# Patient Record
Sex: Female | Born: 1965 | Race: Black or African American | Hispanic: No | Marital: Single | Smoking: Never smoker
Health system: Southern US, Community
[De-identification: ages and names within clinical notes are randomized; demographics above are authoritative.]

## PROBLEM LIST (undated history)

## (undated) DIAGNOSIS — Z8719 Personal history of other diseases of the digestive system: Secondary | ICD-10-CM

## (undated) DIAGNOSIS — I8390 Asymptomatic varicose veins of unspecified lower extremity: Secondary | ICD-10-CM

## (undated) DIAGNOSIS — J3801 Paralysis of vocal cords and larynx, unilateral: Secondary | ICD-10-CM

## (undated) DIAGNOSIS — N3010 Interstitial cystitis (chronic) without hematuria: Secondary | ICD-10-CM

## (undated) DIAGNOSIS — G4733 Obstructive sleep apnea (adult) (pediatric): Secondary | ICD-10-CM

## (undated) DIAGNOSIS — K529 Noninfective gastroenteritis and colitis, unspecified: Secondary | ICD-10-CM

## (undated) DIAGNOSIS — E1140 Type 2 diabetes mellitus with diabetic neuropathy, unspecified: Secondary | ICD-10-CM

## (undated) DIAGNOSIS — Z9989 Dependence on other enabling machines and devices: Secondary | ICD-10-CM

## (undated) DIAGNOSIS — D649 Anemia, unspecified: Secondary | ICD-10-CM

## (undated) DIAGNOSIS — IMO0002 Reserved for concepts with insufficient information to code with codable children: Secondary | ICD-10-CM

## (undated) DIAGNOSIS — K922 Gastrointestinal hemorrhage, unspecified: Secondary | ICD-10-CM

## (undated) DIAGNOSIS — C73 Malignant neoplasm of thyroid gland: Secondary | ICD-10-CM

## (undated) DIAGNOSIS — H8190 Unspecified disorder of vestibular function, unspecified ear: Secondary | ICD-10-CM

## (undated) DIAGNOSIS — R112 Nausea with vomiting, unspecified: Secondary | ICD-10-CM

## (undated) DIAGNOSIS — I890 Lymphedema, not elsewhere classified: Secondary | ICD-10-CM

## (undated) DIAGNOSIS — L732 Hidradenitis suppurativa: Secondary | ICD-10-CM

## (undated) DIAGNOSIS — T8859XA Other complications of anesthesia, initial encounter: Secondary | ICD-10-CM

## (undated) DIAGNOSIS — M797 Fibromyalgia: Secondary | ICD-10-CM

## (undated) DIAGNOSIS — E7800 Pure hypercholesterolemia, unspecified: Secondary | ICD-10-CM

## (undated) DIAGNOSIS — D573 Sickle-cell trait: Secondary | ICD-10-CM

## (undated) DIAGNOSIS — Q211 Atrial septal defect, unspecified: Secondary | ICD-10-CM

## (undated) DIAGNOSIS — Z860100 Personal history of colon polyps, unspecified: Secondary | ICD-10-CM

## (undated) DIAGNOSIS — H04123 Dry eye syndrome of bilateral lacrimal glands: Secondary | ICD-10-CM

## (undated) DIAGNOSIS — R42 Dizziness and giddiness: Secondary | ICD-10-CM

## (undated) DIAGNOSIS — R0600 Dyspnea, unspecified: Secondary | ICD-10-CM

## (undated) DIAGNOSIS — Z9889 Other specified postprocedural states: Secondary | ICD-10-CM

## (undated) DIAGNOSIS — Z8489 Family history of other specified conditions: Secondary | ICD-10-CM

## (undated) DIAGNOSIS — G8929 Other chronic pain: Secondary | ICD-10-CM

## (undated) DIAGNOSIS — E119 Type 2 diabetes mellitus without complications: Secondary | ICD-10-CM

## (undated) DIAGNOSIS — E890 Postprocedural hypothyroidism: Secondary | ICD-10-CM

## (undated) DIAGNOSIS — K449 Diaphragmatic hernia without obstruction or gangrene: Secondary | ICD-10-CM

## (undated) DIAGNOSIS — G43909 Migraine, unspecified, not intractable, without status migrainosus: Secondary | ICD-10-CM

## (undated) DIAGNOSIS — M549 Dorsalgia, unspecified: Secondary | ICD-10-CM

## (undated) DIAGNOSIS — H409 Unspecified glaucoma: Secondary | ICD-10-CM

## (undated) DIAGNOSIS — M1990 Unspecified osteoarthritis, unspecified site: Secondary | ICD-10-CM

## (undated) DIAGNOSIS — K219 Gastro-esophageal reflux disease without esophagitis: Secondary | ICD-10-CM

## (undated) DIAGNOSIS — D259 Leiomyoma of uterus, unspecified: Secondary | ICD-10-CM

## (undated) DIAGNOSIS — M3500 Sicca syndrome, unspecified: Secondary | ICD-10-CM

## (undated) DIAGNOSIS — E278 Other specified disorders of adrenal gland: Secondary | ICD-10-CM

## (undated) DIAGNOSIS — I509 Heart failure, unspecified: Secondary | ICD-10-CM

## (undated) DIAGNOSIS — N809 Endometriosis, unspecified: Secondary | ICD-10-CM

## (undated) DIAGNOSIS — M47812 Spondylosis without myelopathy or radiculopathy, cervical region: Secondary | ICD-10-CM

## (undated) DIAGNOSIS — R251 Tremor, unspecified: Principal | ICD-10-CM

## (undated) DIAGNOSIS — M179 Osteoarthritis of knee, unspecified: Secondary | ICD-10-CM

## (undated) DIAGNOSIS — T4145XA Adverse effect of unspecified anesthetic, initial encounter: Secondary | ICD-10-CM

## (undated) DIAGNOSIS — J42 Unspecified chronic bronchitis: Secondary | ICD-10-CM

## (undated) DIAGNOSIS — K259 Gastric ulcer, unspecified as acute or chronic, without hemorrhage or perforation: Secondary | ICD-10-CM

## (undated) DIAGNOSIS — F4310 Post-traumatic stress disorder, unspecified: Secondary | ICD-10-CM

## (undated) DIAGNOSIS — M1710 Unilateral primary osteoarthritis, unspecified knee: Secondary | ICD-10-CM

## (undated) DIAGNOSIS — I10 Essential (primary) hypertension: Secondary | ICD-10-CM

## (undated) DIAGNOSIS — K589 Irritable bowel syndrome without diarrhea: Secondary | ICD-10-CM

## (undated) DIAGNOSIS — K760 Fatty (change of) liver, not elsewhere classified: Secondary | ICD-10-CM

## (undated) DIAGNOSIS — F32A Depression, unspecified: Secondary | ICD-10-CM

## (undated) HISTORY — DX: Hidradenitis suppurativa: L73.2

## (undated) HISTORY — DX: Diaphragmatic hernia without obstruction or gangrene: K44.9

## (undated) HISTORY — PX: LAPAROSCOPY ABDOMEN DIAGNOSTIC: PRO50

## (undated) HISTORY — PX: OTHER SURGICAL HISTORY: SHX169

## (undated) HISTORY — PX: DIAGNOSTIC LAPAROSCOPY: SUR761

## (undated) HISTORY — DX: Essential (primary) hypertension: I10

## (undated) HISTORY — DX: Dyspnea, unspecified: R06.00

## (undated) HISTORY — DX: Reserved for concepts with insufficient information to code with codable children: IMO0002

## (undated) HISTORY — DX: Interstitial cystitis (chronic) without hematuria: N30.10

## (undated) HISTORY — DX: Obstructive sleep apnea (adult) (pediatric): G47.33

## (undated) HISTORY — DX: Gastro-esophageal reflux disease without esophagitis: K21.9

## (undated) HISTORY — DX: Type 2 diabetes mellitus with diabetic neuropathy, unspecified: E11.40

## (undated) HISTORY — PX: COLONOSCOPY WITH ESOPHAGOGASTRODUODENOSCOPY (EGD): SHX5779

## (undated) HISTORY — DX: Endometriosis, unspecified: N80.9

## (undated) HISTORY — PX: DE QUERVAIN'S RELEASE: SHX1439

## (undated) HISTORY — DX: Irritable bowel syndrome, unspecified: K58.9

## (undated) HISTORY — DX: Type 2 diabetes mellitus without complications: E11.9

## (undated) HISTORY — PX: DILATION AND CURETTAGE OF UTERUS: SHX78

## (undated) HISTORY — DX: Sjogren syndrome, unspecified: M35.00

## (undated) HISTORY — DX: Tremor, unspecified: R25.1

---
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
## 1988-05-18 HISTORY — PX: TONSILLECTOMY: SUR1361

## 1995-05-19 HISTORY — PX: REDUCTION MAMMAPLASTY: SUR839

## 1997-12-10 ENCOUNTER — Emergency Department (HOSPITAL_COMMUNITY): Admission: EM | Admit: 1997-12-10 | Discharge: 1997-12-10 | Payer: Self-pay | Admitting: Emergency Medicine

## 1998-05-18 HISTORY — PX: HYDRADENITIS EXCISION: SHX5243

## 1998-06-20 ENCOUNTER — Other Ambulatory Visit: Admission: RE | Admit: 1998-06-20 | Discharge: 1998-06-20 | Payer: Self-pay | Admitting: Obstetrics and Gynecology

## 1998-08-12 ENCOUNTER — Emergency Department (HOSPITAL_COMMUNITY): Admission: EM | Admit: 1998-08-12 | Discharge: 1998-08-12 | Payer: Self-pay | Admitting: Emergency Medicine

## 1998-09-15 ENCOUNTER — Emergency Department (HOSPITAL_COMMUNITY): Admission: EM | Admit: 1998-09-15 | Discharge: 1998-09-15 | Payer: Self-pay | Admitting: Emergency Medicine

## 1999-02-01 ENCOUNTER — Emergency Department (HOSPITAL_COMMUNITY): Admission: EM | Admit: 1999-02-01 | Discharge: 1999-02-01 | Payer: Self-pay | Admitting: Emergency Medicine

## 1999-03-30 ENCOUNTER — Emergency Department (HOSPITAL_COMMUNITY): Admission: EM | Admit: 1999-03-30 | Discharge: 1999-03-30 | Payer: Self-pay | Admitting: Emergency Medicine

## 1999-04-15 ENCOUNTER — Other Ambulatory Visit: Admission: RE | Admit: 1999-04-15 | Discharge: 1999-04-15 | Payer: Self-pay | Admitting: *Deleted

## 1999-05-21 ENCOUNTER — Encounter: Payer: Self-pay | Admitting: Emergency Medicine

## 1999-05-21 ENCOUNTER — Emergency Department (HOSPITAL_COMMUNITY): Admission: EM | Admit: 1999-05-21 | Discharge: 1999-05-21 | Payer: Self-pay | Admitting: Emergency Medicine

## 1999-11-17 ENCOUNTER — Encounter: Payer: Self-pay | Admitting: Gastroenterology

## 1999-11-17 ENCOUNTER — Encounter: Admission: RE | Admit: 1999-11-17 | Discharge: 1999-11-17 | Payer: Self-pay | Admitting: Gastroenterology

## 2000-04-15 ENCOUNTER — Other Ambulatory Visit: Admission: RE | Admit: 2000-04-15 | Discharge: 2000-04-15 | Payer: Self-pay | Admitting: *Deleted

## 2000-06-26 ENCOUNTER — Encounter (INDEPENDENT_AMBULATORY_CARE_PROVIDER_SITE_OTHER): Payer: Self-pay

## 2000-06-26 ENCOUNTER — Ambulatory Visit (HOSPITAL_COMMUNITY): Admission: RE | Admit: 2000-06-26 | Discharge: 2000-06-26 | Payer: Self-pay | Admitting: Obstetrics and Gynecology

## 2000-06-26 DIAGNOSIS — G8929 Other chronic pain: Secondary | ICD-10-CM | POA: Insufficient documentation

## 2000-06-26 HISTORY — PX: OTHER SURGICAL HISTORY: SHX169

## 2000-12-27 ENCOUNTER — Encounter (INDEPENDENT_AMBULATORY_CARE_PROVIDER_SITE_OTHER): Payer: Self-pay | Admitting: *Deleted

## 2000-12-27 ENCOUNTER — Encounter: Payer: Self-pay | Admitting: Gastroenterology

## 2000-12-27 ENCOUNTER — Ambulatory Visit (HOSPITAL_COMMUNITY): Admission: RE | Admit: 2000-12-27 | Discharge: 2000-12-27 | Payer: Self-pay | Admitting: Gastroenterology

## 2001-01-11 ENCOUNTER — Emergency Department (HOSPITAL_COMMUNITY): Admission: EM | Admit: 2001-01-11 | Discharge: 2001-01-11 | Payer: Self-pay | Admitting: Emergency Medicine

## 2001-01-11 ENCOUNTER — Encounter: Payer: Self-pay | Admitting: Internal Medicine

## 2001-01-18 DIAGNOSIS — N809 Endometriosis, unspecified: Secondary | ICD-10-CM

## 2001-01-18 HISTORY — DX: Endometriosis, unspecified: N80.9

## 2001-02-07 ENCOUNTER — Encounter (INDEPENDENT_AMBULATORY_CARE_PROVIDER_SITE_OTHER): Payer: Self-pay | Admitting: Specialist

## 2001-02-07 ENCOUNTER — Ambulatory Visit (HOSPITAL_COMMUNITY): Admission: RE | Admit: 2001-02-07 | Discharge: 2001-02-07 | Payer: Self-pay | Admitting: Obstetrics and Gynecology

## 2001-02-11 ENCOUNTER — Inpatient Hospital Stay (HOSPITAL_COMMUNITY): Admission: AD | Admit: 2001-02-11 | Discharge: 2001-02-11 | Payer: Self-pay | Admitting: Obstetrics and Gynecology

## 2001-02-14 HISTORY — PX: INCISION AND DRAINAGE ABSCESS ANAL: SUR669

## 2001-06-22 ENCOUNTER — Encounter: Payer: Self-pay | Admitting: Emergency Medicine

## 2001-06-22 ENCOUNTER — Emergency Department (HOSPITAL_COMMUNITY): Admission: EM | Admit: 2001-06-22 | Discharge: 2001-06-23 | Payer: Self-pay | Admitting: Emergency Medicine

## 2001-07-11 ENCOUNTER — Other Ambulatory Visit: Admission: RE | Admit: 2001-07-11 | Discharge: 2001-07-11 | Payer: Self-pay | Admitting: Obstetrics and Gynecology

## 2001-10-04 ENCOUNTER — Encounter: Admission: RE | Admit: 2001-10-04 | Discharge: 2001-10-04 | Payer: Self-pay

## 2001-11-16 ENCOUNTER — Encounter: Admission: RE | Admit: 2001-11-16 | Discharge: 2002-01-02 | Payer: Self-pay | Admitting: Neurology

## 2002-01-28 ENCOUNTER — Emergency Department (HOSPITAL_COMMUNITY): Admission: EM | Admit: 2002-01-28 | Discharge: 2002-01-28 | Payer: Self-pay | Admitting: Emergency Medicine

## 2002-04-11 ENCOUNTER — Ambulatory Visit (HOSPITAL_COMMUNITY): Admission: RE | Admit: 2002-04-11 | Discharge: 2002-04-11 | Payer: Self-pay

## 2002-06-16 ENCOUNTER — Ambulatory Visit (HOSPITAL_BASED_OUTPATIENT_CLINIC_OR_DEPARTMENT_OTHER): Admission: RE | Admit: 2002-06-16 | Discharge: 2002-06-16 | Payer: Self-pay | Admitting: General Surgery

## 2002-06-16 ENCOUNTER — Encounter (INDEPENDENT_AMBULATORY_CARE_PROVIDER_SITE_OTHER): Payer: Self-pay | Admitting: *Deleted

## 2002-06-16 HISTORY — PX: TEMPORAL ARTERY BIOPSY / LIGATION: SUR132

## 2002-07-17 ENCOUNTER — Other Ambulatory Visit: Admission: RE | Admit: 2002-07-17 | Discharge: 2002-07-17 | Payer: Self-pay | Admitting: Obstetrics and Gynecology

## 2002-07-25 ENCOUNTER — Encounter: Payer: Self-pay | Admitting: Obstetrics and Gynecology

## 2002-07-25 ENCOUNTER — Encounter: Admission: RE | Admit: 2002-07-25 | Discharge: 2002-07-25 | Payer: Self-pay | Admitting: Obstetrics and Gynecology

## 2002-12-01 ENCOUNTER — Ambulatory Visit (HOSPITAL_COMMUNITY): Admission: RE | Admit: 2002-12-01 | Discharge: 2002-12-01 | Payer: Self-pay | Admitting: Urology

## 2002-12-01 HISTORY — PX: OTHER SURGICAL HISTORY: SHX169

## 2003-04-23 ENCOUNTER — Ambulatory Visit (HOSPITAL_COMMUNITY): Admission: RE | Admit: 2003-04-23 | Discharge: 2003-04-23 | Payer: Self-pay | Admitting: Urology

## 2003-05-08 ENCOUNTER — Ambulatory Visit (HOSPITAL_BASED_OUTPATIENT_CLINIC_OR_DEPARTMENT_OTHER): Admission: RE | Admit: 2003-05-08 | Discharge: 2003-05-08 | Payer: Self-pay | Admitting: Urology

## 2003-05-08 ENCOUNTER — Ambulatory Visit (HOSPITAL_COMMUNITY): Admission: RE | Admit: 2003-05-08 | Discharge: 2003-05-08 | Payer: Self-pay | Admitting: Urology

## 2003-05-08 HISTORY — PX: OTHER SURGICAL HISTORY: SHX169

## 2003-08-27 ENCOUNTER — Ambulatory Visit (HOSPITAL_COMMUNITY): Admission: RE | Admit: 2003-08-27 | Discharge: 2003-08-27 | Payer: Self-pay | Admitting: Obstetrics & Gynecology

## 2003-10-03 ENCOUNTER — Encounter: Admission: RE | Admit: 2003-10-03 | Discharge: 2003-10-03 | Payer: Self-pay | Admitting: Specialist

## 2003-10-29 ENCOUNTER — Ambulatory Visit (HOSPITAL_COMMUNITY): Admission: RE | Admit: 2003-10-29 | Discharge: 2003-10-29 | Payer: Self-pay | Admitting: Specialist

## 2003-10-29 ENCOUNTER — Encounter (INDEPENDENT_AMBULATORY_CARE_PROVIDER_SITE_OTHER): Payer: Self-pay | Admitting: Specialist

## 2003-10-29 ENCOUNTER — Ambulatory Visit (HOSPITAL_BASED_OUTPATIENT_CLINIC_OR_DEPARTMENT_OTHER): Admission: RE | Admit: 2003-10-29 | Discharge: 2003-10-29 | Payer: Self-pay | Admitting: Specialist

## 2003-10-29 HISTORY — PX: MASS EXCISION: SHX2000

## 2003-12-06 ENCOUNTER — Ambulatory Visit (HOSPITAL_COMMUNITY): Admission: RE | Admit: 2003-12-06 | Discharge: 2003-12-06 | Payer: Self-pay | Admitting: Obstetrics & Gynecology

## 2003-12-06 HISTORY — PX: OTHER SURGICAL HISTORY: SHX169

## 2003-12-20 ENCOUNTER — Encounter: Admission: RE | Admit: 2003-12-20 | Discharge: 2003-12-20 | Payer: Self-pay | Admitting: Orthopaedic Surgery

## 2004-07-01 ENCOUNTER — Emergency Department (HOSPITAL_COMMUNITY): Admission: EM | Admit: 2004-07-01 | Discharge: 2004-07-01 | Payer: Self-pay | Admitting: Family Medicine

## 2004-07-07 ENCOUNTER — Ambulatory Visit (HOSPITAL_COMMUNITY): Admission: RE | Admit: 2004-07-07 | Discharge: 2004-07-07 | Payer: Self-pay | Admitting: Obstetrics & Gynecology

## 2004-08-05 ENCOUNTER — Emergency Department (HOSPITAL_COMMUNITY): Admission: EM | Admit: 2004-08-05 | Discharge: 2004-08-05 | Payer: Self-pay | Admitting: Family Medicine

## 2004-09-18 ENCOUNTER — Ambulatory Visit (HOSPITAL_COMMUNITY): Admission: RE | Admit: 2004-09-18 | Discharge: 2004-09-18 | Payer: Self-pay | Admitting: Obstetrics & Gynecology

## 2004-10-10 ENCOUNTER — Emergency Department (HOSPITAL_COMMUNITY): Admission: EM | Admit: 2004-10-10 | Discharge: 2004-10-10 | Payer: Self-pay | Admitting: Emergency Medicine

## 2005-03-22 ENCOUNTER — Inpatient Hospital Stay (HOSPITAL_COMMUNITY): Admission: AD | Admit: 2005-03-22 | Discharge: 2005-03-30 | Payer: Self-pay | Admitting: Obstetrics

## 2005-04-01 ENCOUNTER — Inpatient Hospital Stay (HOSPITAL_COMMUNITY): Admission: AD | Admit: 2005-04-01 | Discharge: 2005-04-01 | Payer: Self-pay | Admitting: Obstetrics & Gynecology

## 2005-04-12 ENCOUNTER — Inpatient Hospital Stay (HOSPITAL_COMMUNITY): Admission: AD | Admit: 2005-04-12 | Discharge: 2005-04-12 | Payer: Self-pay | Admitting: Obstetrics

## 2005-04-23 ENCOUNTER — Observation Stay (HOSPITAL_COMMUNITY): Admission: AD | Admit: 2005-04-23 | Discharge: 2005-04-23 | Payer: Self-pay | Admitting: Obstetrics

## 2005-05-07 ENCOUNTER — Inpatient Hospital Stay (HOSPITAL_COMMUNITY): Admission: AD | Admit: 2005-05-07 | Discharge: 2005-05-11 | Payer: Self-pay | Admitting: Obstetrics & Gynecology

## 2005-05-11 ENCOUNTER — Inpatient Hospital Stay (HOSPITAL_COMMUNITY): Admission: AD | Admit: 2005-05-11 | Discharge: 2005-05-13 | Payer: Self-pay | Admitting: Obstetrics & Gynecology

## 2005-05-16 ENCOUNTER — Inpatient Hospital Stay (HOSPITAL_COMMUNITY): Admission: AD | Admit: 2005-05-16 | Discharge: 2005-05-17 | Payer: Self-pay | Admitting: Obstetrics

## 2005-05-18 HISTORY — PX: ACHILLES TENDON REPAIR: SUR1153

## 2005-05-28 ENCOUNTER — Ambulatory Visit (HOSPITAL_COMMUNITY): Admission: RE | Admit: 2005-05-28 | Discharge: 2005-05-28 | Payer: Self-pay | Admitting: Obstetrics & Gynecology

## 2005-05-29 ENCOUNTER — Encounter (INDEPENDENT_AMBULATORY_CARE_PROVIDER_SITE_OTHER): Payer: Self-pay | Admitting: Specialist

## 2005-05-29 ENCOUNTER — Ambulatory Visit (HOSPITAL_COMMUNITY): Admission: RE | Admit: 2005-05-29 | Discharge: 2005-05-29 | Payer: Self-pay | Admitting: Obstetrics & Gynecology

## 2005-05-29 HISTORY — PX: OTHER SURGICAL HISTORY: SHX169

## 2005-06-09 ENCOUNTER — Ambulatory Visit (HOSPITAL_BASED_OUTPATIENT_CLINIC_OR_DEPARTMENT_OTHER): Admission: RE | Admit: 2005-06-09 | Discharge: 2005-06-09 | Payer: Self-pay | Admitting: Urology

## 2005-08-05 ENCOUNTER — Other Ambulatory Visit: Admission: RE | Admit: 2005-08-05 | Discharge: 2005-08-05 | Payer: Self-pay | Admitting: Obstetrics and Gynecology

## 2005-08-25 ENCOUNTER — Encounter: Admission: RE | Admit: 2005-08-25 | Discharge: 2005-08-25 | Payer: Self-pay | Admitting: Obstetrics and Gynecology

## 2005-10-30 ENCOUNTER — Emergency Department (HOSPITAL_COMMUNITY): Admission: EM | Admit: 2005-10-30 | Discharge: 2005-10-30 | Payer: Self-pay | Admitting: Emergency Medicine

## 2005-11-17 ENCOUNTER — Encounter: Admission: RE | Admit: 2005-11-17 | Discharge: 2006-02-15 | Payer: Self-pay | Admitting: Orthopaedic Surgery

## 2005-12-01 ENCOUNTER — Emergency Department (HOSPITAL_COMMUNITY): Admission: EM | Admit: 2005-12-01 | Discharge: 2005-12-01 | Payer: Self-pay | Admitting: Family Medicine

## 2005-12-24 ENCOUNTER — Encounter: Admission: RE | Admit: 2005-12-24 | Discharge: 2005-12-24 | Payer: Self-pay | Admitting: Orthopaedic Surgery

## 2006-02-17 ENCOUNTER — Encounter: Admission: RE | Admit: 2006-02-17 | Discharge: 2006-02-17 | Payer: Self-pay | Admitting: Orthopaedic Surgery

## 2006-07-22 ENCOUNTER — Encounter: Admission: RE | Admit: 2006-07-22 | Discharge: 2006-07-22 | Payer: Self-pay | Admitting: Gastroenterology

## 2006-09-06 ENCOUNTER — Ambulatory Visit (HOSPITAL_COMMUNITY): Admission: RE | Admit: 2006-09-06 | Discharge: 2006-09-06 | Payer: Self-pay | Admitting: Obstetrics and Gynecology

## 2007-03-04 ENCOUNTER — Ambulatory Visit (HOSPITAL_BASED_OUTPATIENT_CLINIC_OR_DEPARTMENT_OTHER): Admission: RE | Admit: 2007-03-04 | Discharge: 2007-03-04 | Payer: Self-pay | Admitting: Urology

## 2007-03-31 ENCOUNTER — Encounter: Admission: RE | Admit: 2007-03-31 | Discharge: 2007-03-31 | Payer: Self-pay | Admitting: Obstetrics and Gynecology

## 2007-05-03 ENCOUNTER — Emergency Department (HOSPITAL_COMMUNITY): Admission: EM | Admit: 2007-05-03 | Discharge: 2007-05-03 | Payer: Self-pay | Admitting: Emergency Medicine

## 2007-06-09 ENCOUNTER — Encounter: Admission: RE | Admit: 2007-06-09 | Discharge: 2007-06-09 | Payer: Self-pay | Admitting: Internal Medicine

## 2007-09-28 ENCOUNTER — Encounter: Admission: RE | Admit: 2007-09-28 | Discharge: 2007-09-28 | Payer: Self-pay | Admitting: Obstetrics and Gynecology

## 2007-11-03 ENCOUNTER — Encounter: Admission: RE | Admit: 2007-11-03 | Discharge: 2007-11-03 | Payer: Self-pay | Admitting: Surgery

## 2007-11-22 ENCOUNTER — Other Ambulatory Visit: Admission: RE | Admit: 2007-11-22 | Discharge: 2007-11-22 | Payer: Self-pay | Admitting: Surgery

## 2008-02-14 ENCOUNTER — Encounter: Admission: RE | Admit: 2008-02-14 | Discharge: 2008-02-14 | Payer: Self-pay | Admitting: Internal Medicine

## 2008-03-08 ENCOUNTER — Emergency Department (HOSPITAL_COMMUNITY): Admission: EM | Admit: 2008-03-08 | Discharge: 2008-03-08 | Payer: Self-pay | Admitting: Emergency Medicine

## 2008-04-19 ENCOUNTER — Encounter: Admission: RE | Admit: 2008-04-19 | Discharge: 2008-04-19 | Payer: Self-pay | Admitting: Internal Medicine

## 2008-05-18 HISTORY — PX: TEMPORAL ARTERY BIOPSY / LIGATION: SUR132

## 2008-06-25 ENCOUNTER — Encounter: Admission: RE | Admit: 2008-06-25 | Discharge: 2008-06-25 | Payer: Self-pay | Admitting: Surgery

## 2008-10-12 ENCOUNTER — Encounter: Admission: RE | Admit: 2008-10-12 | Discharge: 2008-10-12 | Payer: Self-pay | Admitting: Obstetrics and Gynecology

## 2008-11-05 ENCOUNTER — Ambulatory Visit: Payer: Self-pay | Admitting: Surgery

## 2008-11-05 ENCOUNTER — Encounter: Payer: Self-pay | Admitting: Podiatry

## 2008-11-05 ENCOUNTER — Ambulatory Visit (HOSPITAL_COMMUNITY): Admission: RE | Admit: 2008-11-05 | Discharge: 2008-11-05 | Payer: Self-pay | Admitting: Podiatry

## 2008-11-06 ENCOUNTER — Ambulatory Visit (HOSPITAL_COMMUNITY): Admission: RE | Admit: 2008-11-06 | Discharge: 2008-11-07 | Payer: Self-pay | Admitting: Surgery

## 2008-11-06 ENCOUNTER — Encounter (INDEPENDENT_AMBULATORY_CARE_PROVIDER_SITE_OTHER): Payer: Self-pay | Admitting: Surgery

## 2008-11-06 HISTORY — PX: TOTAL THYROIDECTOMY: SHX2547

## 2009-03-04 ENCOUNTER — Encounter (HOSPITAL_COMMUNITY): Admission: RE | Admit: 2009-03-04 | Discharge: 2009-05-15 | Payer: Self-pay | Admitting: Endocrinology

## 2009-04-17 ENCOUNTER — Emergency Department (HOSPITAL_COMMUNITY): Admission: EM | Admit: 2009-04-17 | Discharge: 2009-04-18 | Payer: Self-pay | Admitting: Emergency Medicine

## 2009-06-18 HISTORY — PX: BUNIONECTOMY WITH HAMMERTOE RECONSTRUCTION AND GASTROC SLIDE: SHX5601

## 2009-09-09 ENCOUNTER — Encounter: Admission: RE | Admit: 2009-09-09 | Discharge: 2009-09-09 | Payer: Self-pay | Admitting: Internal Medicine

## 2009-10-21 ENCOUNTER — Encounter (HOSPITAL_COMMUNITY): Admission: RE | Admit: 2009-10-21 | Discharge: 2009-12-17 | Payer: Self-pay | Admitting: Surgery

## 2010-03-18 HISTORY — PX: FOOT HARDWARE REMOVAL: SHX1661

## 2010-05-02 ENCOUNTER — Encounter (INDEPENDENT_AMBULATORY_CARE_PROVIDER_SITE_OTHER): Payer: Self-pay | Admitting: Emergency Medicine

## 2010-05-02 ENCOUNTER — Emergency Department (HOSPITAL_COMMUNITY)
Admission: EM | Admit: 2010-05-02 | Discharge: 2010-05-02 | Payer: Self-pay | Source: Home / Self Care | Admitting: Emergency Medicine

## 2010-06-08 ENCOUNTER — Encounter: Payer: Self-pay | Admitting: Obstetrics & Gynecology

## 2010-06-08 ENCOUNTER — Encounter: Payer: Self-pay | Admitting: Endocrinology

## 2010-06-08 ENCOUNTER — Encounter: Payer: Self-pay | Admitting: Surgery

## 2010-08-04 LAB — HCG, SERUM, QUALITATIVE: Preg, Serum: NEGATIVE

## 2010-08-19 LAB — URINALYSIS, ROUTINE W REFLEX MICROSCOPIC
Bilirubin Urine: NEGATIVE
Glucose, UA: NEGATIVE mg/dL
Hgb urine dipstick: NEGATIVE
Ketones, ur: NEGATIVE mg/dL
Nitrite: NEGATIVE
Protein, ur: NEGATIVE mg/dL
Specific Gravity, Urine: 1.008 (ref 1.005–1.030)
Urobilinogen, UA: 0.2 mg/dL (ref 0.0–1.0)
pH: 6 (ref 5.0–8.0)

## 2010-08-19 LAB — COMPREHENSIVE METABOLIC PANEL
ALT: 9 U/L (ref 0–35)
AST: 13 U/L (ref 0–37)
Albumin: 3.8 g/dL (ref 3.5–5.2)
Alkaline Phosphatase: 66 U/L (ref 39–117)
BUN: 9 mg/dL (ref 6–23)
CO2: 30 mEq/L (ref 19–32)
Calcium: 9.3 mg/dL (ref 8.4–10.5)
Chloride: 99 mEq/L (ref 96–112)
Creatinine, Ser: 1.08 mg/dL (ref 0.4–1.2)
GFR calc Af Amer: >60 mL/min (ref >60)
GFR calc non Af Amer: 55 mL/min — ABNORMAL LOW (ref >60)
Glucose, Bld: 77 mg/dL (ref 70–99)
Potassium: 3.4 mEq/L — ABNORMAL LOW (ref 3.5–5.1)
Sodium: 135 mEq/L (ref 135–145)
Total Bilirubin: 0.6 mg/dL (ref 0.3–1.2)
Total Protein: 8.1 g/dL (ref 6.0–8.3)

## 2010-08-19 LAB — CBC
HCT: 30.9 % — ABNORMAL LOW (ref 36.0–46.0)
Hemoglobin: 10.7 g/dL — ABNORMAL LOW (ref 12.0–15.0)
MCHC: 34.5 g/dL (ref 30.0–36.0)
MCV: 90.8 fL (ref 78.0–100.0)
Platelets: 372 10*3/uL (ref 150–400)
RBC: 3.41 MIL/uL — ABNORMAL LOW (ref 3.87–5.11)
RDW: 13.4 % (ref 11.5–15.5)
WBC: 15.9 10*3/uL — ABNORMAL HIGH (ref 4.0–10.5)

## 2010-08-19 LAB — DIFFERENTIAL
Basophils Absolute: 0 10*3/uL (ref 0.0–0.1)
Basophils Relative: 0 % (ref 0–1)
Eosinophils Absolute: 0.1 10*3/uL (ref 0.0–0.7)
Eosinophils Relative: 1 % (ref 0–5)
Lymphocytes Relative: 13 % (ref 12–46)
Lymphs Abs: 2.1 10*3/uL (ref 0.7–4.0)
Monocytes Absolute: 1.3 10*3/uL — ABNORMAL HIGH (ref 0.1–1.0)
Monocytes Relative: 8 % (ref 3–12)
Neutro Abs: 12.4 10*3/uL — ABNORMAL HIGH (ref 1.7–7.7)
Neutrophils Relative %: 78 % — ABNORMAL HIGH (ref 43–77)

## 2010-08-19 LAB — POCT PREGNANCY, URINE: Preg Test, Ur: NEGATIVE

## 2010-08-19 LAB — LIPASE, BLOOD: Lipase: 23 U/L (ref 11–59)

## 2010-08-21 LAB — HCG, SERUM, QUALITATIVE: Preg, Serum: NEGATIVE

## 2010-08-25 LAB — COMPREHENSIVE METABOLIC PANEL
ALT: 23 U/L (ref 0–35)
AST: 21 U/L (ref 0–37)
Albumin: 3.8 g/dL (ref 3.5–5.2)
Alkaline Phosphatase: 74 U/L (ref 39–117)
BUN: 7 mg/dL (ref 6–23)
CO2: 30 mEq/L (ref 19–32)
Calcium: 9.5 mg/dL (ref 8.4–10.5)
Chloride: 105 mEq/L (ref 96–112)
Creatinine, Ser: 0.81 mg/dL (ref 0.4–1.2)
GFR calc Af Amer: >60 mL/min (ref >60)
GFR calc non Af Amer: >60 mL/min (ref >60)
Glucose, Bld: 102 mg/dL — ABNORMAL HIGH (ref 70–99)
Potassium: 3.7 mEq/L (ref 3.5–5.1)
Sodium: 141 mEq/L (ref 135–145)
Total Bilirubin: 0.4 mg/dL (ref 0.3–1.2)
Total Protein: 7.9 g/dL (ref 6.0–8.3)

## 2010-08-25 LAB — CBC
HCT: 34.2 % — ABNORMAL LOW (ref 36.0–46.0)
Hemoglobin: 11.7 g/dL — ABNORMAL LOW (ref 12.0–15.0)
MCHC: 34.4 g/dL (ref 30.0–36.0)
MCV: 91.5 fL (ref 78.0–100.0)
Platelets: 397 10*3/uL (ref 150–400)
RBC: 3.73 MIL/uL — ABNORMAL LOW (ref 3.87–5.11)
RDW: 14 % (ref 11.5–15.5)
WBC: 11.3 10*3/uL — ABNORMAL HIGH (ref 4.0–10.5)

## 2010-08-25 LAB — CALCIUM
Calcium: 8.8 mg/dL (ref 8.4–10.5)
Calcium: 9 mg/dL (ref 8.4–10.5)

## 2010-08-25 LAB — DIFFERENTIAL
Basophils Absolute: 0.1 10*3/uL (ref 0.0–0.1)
Basophils Relative: 1 % (ref 0–1)
Eosinophils Absolute: 0.1 10*3/uL (ref 0.0–0.7)
Eosinophils Relative: 1 % (ref 0–5)
Lymphocytes Relative: 22 % (ref 12–46)
Lymphs Abs: 2.5 10*3/uL (ref 0.7–4.0)
Monocytes Absolute: 1.2 10*3/uL — ABNORMAL HIGH (ref 0.1–1.0)
Monocytes Relative: 10 % (ref 3–12)
Neutro Abs: 7.4 10*3/uL (ref 1.7–7.7)
Neutrophils Relative %: 66 % (ref 43–77)

## 2010-08-25 LAB — PREGNANCY, URINE: Preg Test, Ur: NEGATIVE

## 2010-08-29 ENCOUNTER — Other Ambulatory Visit: Payer: Self-pay | Admitting: Internal Medicine

## 2010-08-29 DIAGNOSIS — Z1231 Encounter for screening mammogram for malignant neoplasm of breast: Secondary | ICD-10-CM

## 2010-09-11 ENCOUNTER — Ambulatory Visit
Admission: RE | Admit: 2010-09-11 | Discharge: 2010-09-11 | Disposition: A | Discharge status: Home / Self Care | Payer: Medicaid Other | Source: Ambulatory Visit | Attending: Internal Medicine | Admitting: Internal Medicine

## 2010-09-11 DIAGNOSIS — Z1231 Encounter for screening mammogram for malignant neoplasm of breast: Secondary | ICD-10-CM

## 2010-09-30 NOTE — Discharge Summary (Signed)
Alexis [name], Alexis [name]              ACCOUNT NO.:  [id number]   MEDICAL RECORD NO.:  [id number]          PATIENT TYPE:  OIB   LOCATION:  1338                         FACILITY:  [facility]   PHYSICIAN:  Clovis Pu. Cornett, M.D.DATE OF BIRTH:  1966-01-07   DATE OF ADMISSION:  11/06/2008  DATE OF DISCHARGE:  11/07/2008                               DISCHARGE SUMMARY   ADMITTING DIAGNOSIS:  Thyroid nodule with thyroiditis.   DISCHARGE DIAGNOSIS:  Same.   PROCEDURE PERFORMED:  Total thyroidectomy.   BRIEF HISTORY:  The patient is a 44 year old female with progressive  dysphagia and neck pressure secondary to an enlarging thyroid nodule.  She presents for total thyroidectomy for treatment of this.   HOSPITAL COURSE:  Please see operative note for details of procedure.   HOSPITAL COURSE:  Unremarkable.  Her calcium 6 hours after the procedure  was 8.8, and then postoperative day one 9.  She had no hoarseness.  The  swelling was minimal with no evidence of hematoma on her neck.  She was  discharged home on postoperative day #1 in satisfactory condition.   DISCHARGE INSTRUCTIONS:  I will see her back in 2 weeks.  She will take  Tums 2 tablets p.o. t.i.d.  She will require no pain medications since  this is managed by her pain doctor.  She will refrain from driving for  two weeks, return to work in 2 - 3 weeks.   CONDITION AT DISCHARGE:  Stable.      Thomas A. Cornett, M.D.  Electronically Signed     TAC/MEDQ  D:  11/07/2008  T:  11/07/2008  Job:  [id number]

## 2010-09-30 NOTE — Op Note (Signed)
Alexis [name], [name]              ACCOUNT NO.:  [id number]   MEDICAL RECORD NO.:  [id number]          PATIENT TYPE:  OIB   LOCATION:  1338                         FACILITY:  [facility]   PHYSICIAN:  Clovis Pu. Cornett, M.D.DATE OF BIRTH:  1965-09-12   DATE OF PROCEDURE:  11/06/2008  DATE OF DISCHARGE:  11/05/2008                               OPERATIVE REPORT   PREOPERATIVE DIAGNOSIS:  Dominant right thyroid nodule with history of  thyroiditis and compressive symptoms.   POSTOPERATIVE DIAGNOSIS:  Dominant right thyroid nodule with history of  thyroiditis and compressive symptoms.   PROCEDURE:  Total thyroidectomy.   SURGEON:  Harriette Bouillon, MD.   ANESTHESIA:  General endotracheal anesthesia.   ASSISTANT:  Velora Heckler, MD.   ANESTHESIA:  General endotracheal anesthesia   EBL:  20 mL.   SPECIMEN:  Thyroid gland to pathology.   DRAINS:  None.   INDICATIONS FOR PROCEDURE:  The patient is a 45 year old female who has  had a longstanding history of a right thyroid nodule and probable  Hashimoto's thyroiditis.  She has developed more compressive-type  symptoms and I have followed her now for a while.  This area was fine-  needle aspirated and it was felt to be secondary to thyroiditis.  I have  been seeing her and she has had more compressive-type symptoms.  We  talked about options and certainly we can continue to follow her, but  she was interested in surgery and I talked to her at length about that.  I went over the potential complications of bleeding, infection,  recurrent nerve injury leading to hoarseness, airway problems, the risk  of hypocalcemia secondary parathyroid injury, injury to the structures,  bleeding, airway obstruction.  After talking about surgery at great  length with her, she at this point in time did not wish to be followed  any more for this nodule even though it is benign, which she understood.  She wished to have surgery and have her thyroid gland  removed and given  that she is beginning to have compressive symptoms, which is new for  her, I did not feel this was unreasonable.   DESCRIPTION OF PROCEDURE:  The patient was brought to the operating  room, placed supine.  After induction of general anesthesia, her neck  was extended and the neck was prepped and draped in a sterile fashion.  She was placed in reverse Trendelenburg.  A transverse cervical incision  was made 2 fingerbreadths above the sternal notch and dissection was  carried down through the platysma muscle.  Inferior and superior flaps  were raised.  The midline strap muscles were separated in the middle and  we began to work on the right lobe initially.  I was able to place my  finger around the right lobe.  There was some inflammatory change which  would be consistent with thyroiditis.  The superior part of the right  lobe was identified and the superior pedicle was divided using a  combination of 2-0 Vicryl and clips and the Harmonic scalpel.  We then  were able to take the middle thyroid  vein in a similar fashion.  Once we  did that, the gland rolled up and we dissected the gland away from the  parathyroid glands, which we did see, and the recurrent nerve on the  right was well out of the operative field.  Small branch vessels were  taken in between clips and the Harmonic scalpel.  The inferior thyroid  pedicle was also taken right as it entered the gland with 3-0 Vicryl  ties and clips as needed.  Once this lobe was rolled up, the tubercle  was identified and actually I left a small amount of thyroid tissue down  and cauterized this until I got down to the trachea.   Next, left side was addressed.  The superior thyroid pedicle was  dissected out and divided with 3-0 Vicryl ties and clips with the  Harmonic scalpel and divided.  The gland rolled up quite nicely.  This  was the smaller side but it was somewhat nodular and had some signs of  inflammation on this  side.  The inferior pole as well was dissected free  and the small branch vessels were taken at the entry of the thyroid  gland itself.  As we were mobilizing the gland up, we continued to stay  close to the gland itself, dividing the small vessels of the inferior  and superior thyroid vessels between clips and the Harmonic.  The middle  thyroid vein was also taken in a similar fashion.  As we rolled the  gland up, we were on the anterior surface of the trachea.  There  appeared to be a small branch of the inferior thyroid artery that came  up onto the gland itself.  I was able to see the left recurrent nerve  and we were well anterior to this as we rolled the gland.  We continued  to work close to the gland, well above the tracheoesophageal groove.  There was a part of the mid gland that was stuck down onto the nerve.  We carefully worked above this, but as we rolled the gland over, there  appeared to be a branch of the recurrent nerve that came quite anterior  to enter the larynx.  This appeared to be quite small and looked as if  it were entering the gland.  It was divided with the Harmonic scalpel.  Once we recognized this, we examined the nerve carefully and this was a  very small branch to be the main left recurrent nerve, but was saw no  other evidence of nerve tissue.  I did not want to dissect too much  around it just in case there were smaller branches that were going to  the larynx, I did not want to further injure these.  We examined this  carefully and decided to take a single stitch of 4-0 Vicryl and to lay  the nerve ends adjacent to each other.  Again, this appeared to be a  small branch but we could not be totally sure and I did not want to do  any further dissecting down there with fear of injuring either the  parathyroids or the nerve.  Also, this was quite anterior on the larynx  and was not in the typical spot for the left recurrent nerve but, again,  this could have  been an aberrant branch, and after speaking with Dr.  Gerrit Friends about this, we decided to reapproximate it the ends and leave  that in place without any further dissection for  fear of further injury.  The parathyroid glands on the left were preserved.  The gland itself was  then dissected off the trachea with cautery and passed off the field.  We irrigated out both sides and hemostasis was excellent.  Surgicel was  placed in the operative field without difficulty.  We then  reapproximated the midline strap muscles and the platysma muscle with 3-  0 Vicryl, and 4-0 Monocryl was used to close the skin.  Dermabond was  applied.  All final counts of sponges, needles and instruments were  found to be correct at this portion of the case.  The patient was then  extubated and taken to recovery in satisfactory condition.      Thomas A. Cornett, M.D.  Electronically Signed     TAC/MEDQ  D:  11/06/2008  T:  11/06/2008  Job:  [id number]

## 2010-09-30 NOTE — Op Note (Signed)
NAMEGIANNI, [name]              ACCOUNT NO.:  [id number]   MEDICAL RECORD NO.:  [id number]          PATIENT TYPE:  AMB   LOCATION:  NESC                         FACILITY:  [facility]   PHYSICIAN:  Jamison Neighbor, M.D.  DATE OF BIRTH:  [birth year]   DATE OF PROCEDURE:  03/04/2007  DATE OF DISCHARGE:                               OPERATIVE REPORT   PREOPERATIVE DIAGNOSIS:  1. Interstitial cystitis.  2. Microscopic hematuria.   POSTOPERATIVE DIAGNOSIS:  1. Interstitial cystitis.  2. Microscopic hematuria.   PROCEDURE:  Cystoscopy, urethral calibration, hydrodistention of the  bladder Marcaine and Pyridium installation, Marcaine and Kenalog  injection.   SURGEON:  Jamison Neighbor, M.D.   ANESTHESIA:  General.   COMPLICATIONS:  None.   DRAINS:  None.   BRIEF HISTORY:  This 45 year old female has had problems with lower  abdominal pain and past history of interstitial cystitis.  The patient  has had worsening of her symptoms and has requested repeat  hydrodistention be performed.  She understands the risks and benefits of  the procedure and gave full informed consent.   OPERATIVE PROCEDURE:  After successful induction of general anesthesia,  the patient was placed in the dorsal lithotomy position and prepped with  Betadine and draped in the usual sterile fashion.  Careful bimanual  examination revealed no significant cystocele, no prolapse, and no  palpable masses of any kind.  The urethra was calibrated to 32-French  with female urethral sounds with no evidence of stenosis or stricture.  The cystoscope was inserted.  The bladder was carefully inspected.  Both  ureteral orifices were normal in configuration and location.  No tumors,  stones or other irregularities could be seen.  Hydrodistention of the  bladder was performed.  The bladder was filled for five minutes and then  drained.  Glomerulations could be seen, they were relatively modest, and  the overall bladder capacity  of 1050 mL is nearly normal.  This is a  significant improvement over what the patient has had in the past and  would suggest that she has had some improvement from her medical  program.  There was no need for biopsy, there was nothing that required  cauterization.  The bladder was drained.  A mixture of Marcaine and  Pyridium was left in the bladder. Marcaine and Kenalog were injected  periurethrally.  The patient tolerated the  procedure well and was taken to the recovery room in good condition.  She will continue on her program of Flomax, Elmiron, Neurontin, and  hydroxyzine.  She will return in routine follow up.  If she is still  having problems, she will be started installation therapy.      Jamison Neighbor, M.D.  Electronically Signed     RJE/MEDQ  D:  03/04/2007  T:  03/05/2007  Job:  [id number]

## 2010-10-03 NOTE — Op Note (Signed)
[facility]  Patient:    IDELLE, REIMANN Visit Number: [id number] MRN: [id number]          Service Type: DSU Location: [facility] Attending Physician:  Jaymes Graff A Admit Date:  02/07/2001                             Operative Report  PREOPERATIVE DIAGNOSIS:       Recurrent perineal abscess.  POSTOPERATIVE DIAGNOSIS:      Recurrent perineal abscess.  OPERATION:                    Removal of perineal abscess.  SURGEON:                      Dr. Normand Sloop  ASSISTANT:  ANESTHESIA:                   13 cc of 2% lidocaine with IV sedation.  ESTIMATED BLOOD LOSS:         Minimal.  FLUIDS:                       1700 cc of crystalloid.  COMPLICATIONS:                None.  FINDINGS:                     Small perineal abscess draining scant amount of purulent material.  DESCRIPTION OF PROCEDURE:     The patient was taken to the operating room where she was given IV sedation and placed in the dorsal lithotomy position position.  10 cc of 2% lidocaine was placed on the right perineal area around the abscess.  The anaerobic and aerobic cultures were then taken of the scant purulent material and an elliptical incision was then made with the knife and carried down to the fascia using the Bovie.  About a 2 cm area including the abscess was removed without difficulty.  The subcutaneous tissue was reapproximated using interrupted suture of 2-0 chromic.  The skin was reapproximated using 3-0 chromic in interrupted fashion.  Before the 2-0 chromic was placed, hemostasis was obtained using Bovie cautery.  Sponge, needle, and instrument counts were correct x 2.  The patient returned to the recovery room in stable condition.  Also cultures were sent to pathology. Before the 3-0 chromic sutures were placed, an additional 3 cc of lidocaine was injected around the skin for anesthesia. Attending Physician:  Michael Litter DD:  02/07/01 TD:  02/07/01 Job:  [id number]

## 2010-10-03 NOTE — Op Note (Signed)
   NAME:  Alexis [name], Alexis [name]                        ACCOUNT NO.:  [id number]   MEDICAL RECORD NO.:  [id number]                   PATIENT TYPE:  AMB   LOCATION:  DSC                                  FACILITY:  [facility]   PHYSICIAN:  Gita Kudo, M.D.              DATE OF BIRTH:  May 04, 1966   DATE OF PROCEDURE:  06/16/2002  DATE OF DISCHARGE:                                 OPERATIVE REPORT   OPERATIVE PROCEDURE:  Right temporal artery biopsy.   SURGEON:  Gita Kudo, M.D.   ANESTHESIA:  MAC - intravenous sedation, local 1% Xylocaine.   PREOPERATIVE DIAGNOSIS:  Possible temporal arteritis.   POSTOPERATIVE DIAGNOSIS:  Possible temporal arteritis, pending pathology.   CLINICAL SUMMARY:  This is a 45 year old female with headaches and right  temporal pain, on prednisone, needed temporal artery biopsy per Dr. Phylliss Bob and  Dr. Harlon Flor and comes in for the same.   OPERATIVE FINDINGS:  The right superficial temporal artery was small and  somewhat thickened.   OPERATIVE PROCEDURE:  Under satisfactory intravenous sedation, the patient  was positioned, prepped and draped in the standard fashion.  Then, 1%  Xylocaine was infiltrated throughout for good analgesia.  A curved S-shape  incision was made in front of the ear and along the hairline, after  palpating the artery.  This was carried down through the deep subcutaneous  tissue.  Self-retaining retractors were placed and careful dissection was  used to delineate the artery.  It was then clamped on either side of a  bifurcation and also at it's proximal aspect and the segment cut away and  sent for pathology.  The vessels were then tied with 3-0 chromic and the  wound lavaged with  saline.  The wound was closed with interrupted deep 3-0 chromic followed by  interrupted and running 4-0 and 5-0 nylon.  A sterile absorbant dressing was  applied and the patient went to the recovery room from the operating room in  good  condition.                                                 Gita Kudo, M.D.    MRL/MEDQ  D:  06/16/2002  T:  06/16/2002  Job:  [id number]   cc:   Areatha Keas, M.D.  [address]  [address]  [place]  [place] [address]  Fax: [phone]   Blake Divine., M.D.  [address]  [place]  [place] [address]  Fax: [phone]

## 2010-10-03 NOTE — Discharge Summary (Signed)
Alexis [name], Alexis [name]              ACCOUNT NO.:  [id number]   MEDICAL RECORD NO.:  [id number]          PATIENT TYPE:  INP   LOCATION:  9134                          FACILITY:  [facility]   PHYSICIAN:  Roseanna Rainbow, M.D.DATE OF BIRTH:  1965/11/30   DATE OF ADMISSION:  03/22/2005  DATE OF DISCHARGE:  03/30/2005                                 DISCHARGE SUMMARY   CHIEF COMPLAINT:  The patient is a 45 year old gravida 3, para 2 with an  estimated date of confinement of May 08, 2005 who presented complaining  of shortness of breath, pelvic pressure and uterine contractions.   HISTORY OF PRESENT ILLNESS:  Please see the above.   PHYSICAL EXAMINATION:  VITAL SIGNS:  On physical examination the oxygen  saturations were 94 to 99% on room air.   ASSESSMENT:  Intrauterine pregnancy, dyspnea, no evidence of acute  respiratory distress, rule out thromboembolic event.   PLAN:  Admission, chest x-ray, chest CT scan.   HOSPITAL COURSE:  The patient was admitted and chest x-ray demonstrated a  heart size upper limits of normal.  There was mild pulmonary vascular  congestion.  CT scan was essentially normal.  She was started on Procardia  XL for threatened preterm labor.  She was felt to have an upper respiratory  tract infection likely bronchitis.  She was started on broad-spectrum  parenteral antibiotics.  She was also felt to have possible reactive airways  and was started on an albuterol inhaler and her symptoms gradually improved.  Other work up included cardiac enzymes, basic metabolic profile as well as  thyroid function studies that were all normal.  She was noted to have mild  hypokalemia which was repleted orally.  She was discharged to home on  March 30, 2005.   DISCHARGE DIAGNOSES:  1.  Intrauterine pregnancy.  2.  Upper respiratory tract infection/bronchitis.   CONDITION ON DISCHARGE:  Stable.   DIET:  Regular.   ACTIVITY:  Ad lib.   MEDICATIONS:  Ceftin, Zithromax,  Protonix, albuterol, resume home  medications.   DISPOSITION:  The patient was to follow up in the office on April 02, 2005.      Roseanna Rainbow, M.D.  Electronically Signed     LAJ/MEDQ  D:  05/22/2005  T:  05/22/2005  Job:  [id number]

## 2010-10-03 NOTE — H&P (Signed)
NAME:  Alexis [name], Alexis [name] NO.:  [id number]   MEDICAL RECORD NO.:  [id number]                   PATIENT TYPE:   LOCATION:                                       FACILITY:   PHYSICIAN:  Roseanna Rainbow, M.D.         DATE OF BIRTH:   DATE OF ADMISSION:  DATE OF DISCHARGE:                                HISTORY & PHYSICAL   CHIEF COMPLAINT:  The patient is a 45 year old African American female with  a Mirena IUD in situ who presents for IUD removal.   HISTORY OF PRESENT ILLNESS:  As above.  Attempts were made to remove the IUD  in the office.  The strings were not visualized.  Attempts to remove the IUD  in the office were not well tolerated.   MEDICATIONS:  Effexor, Keppra, ___________, Folcaps.   ALLERGIES:  SULFA DRUGS.   PAST OBSTETRIC/GYNECOLOGIC HISTORY:  See above.  1. She has a history of endometriosis and pelvic adhesions.  2. She has had diagnostic laparoscopy.  3. Surgical laparoscopy with ablation of endometriosis implants and     attempted pain mapping.  4. The patient has been pregnant two times.  She has one living child.     There is history of one miscarriage or abortion.   PAST MEDICAL HISTORY:  1. Migraine headache.  2. Sickle cell trait.  3. Kidney cyst.  4. Bilateral ureteroceles with ureteral obstruction.  5. Occipital neuralgia.  6. Glaucoma.   PAST SURGICAL HISTORY:  See above.  1. Tonsils and adenoids.  2. Pilonidal cysts.  3. Mammoplasty.  4. Perineal cysts.  5. Temporal artery biopsy.  6. Status post recent excision of breast abscesses.  7. Status post cystoscopy with urethral calibration cystogram.  8. Bilateral ureterocele unroofing.  9. Bilateral double J catheter insertions.   SOCIAL HISTORY:  She is single, unemployed.  She has no significant smoking  history.  Drinks a minimal amount of alcohol.  She reports a minimal amount  of caffeinated beverages daily and she denies illicit drug use.   FAMILY  HISTORY:  Cerebrovascular accident, myocardial infarction.   PHYSICAL EXAMINATION:  GENERAL APPEARANCE:  A well-developed, well-nourished  African American female in no apparent distress.  VITAL SIGNS:  Blood pressure 114/67, pulse 96, temperature 97.4, weight 202  pounds.  LUNGS:  Clear to auscultation bilaterally.  CARDIOVASCULAR:  Regular rate and rhythm.  ABDOMEN:  There is tenderness to deep palpation right lower quadrant.  PELVIC:  The external female genitalia are normal-appearing.  On speculum  examination, the vagina is clear.  On bimanual examination, the uterus is  anteverted, normal size, nontender.  On the right adnexa, there is minimal  tenderness.  The left adnexa is nontender.   ASSESSMENT:  Intrauterine device in situ, rule out an embedded intrauterine  device.   PLAN:  Operative hysteroscopy with IUD removal.  The risks, benefits, and  alternative forms of management were reviewed with the  patient and informed  consent had been obtained.                                               Roseanna Rainbow, M.D.    [name]  D:  11/26/2003  T:  11/26/2003  Job:  [id number]

## 2010-10-03 NOTE — Op Note (Signed)
[facility]  Patient:    Alexis [name], Alexis [name]                     MRN: [id number] Proc. Date: 06/26/00 Adm. Date:  [id number] Attending:  Leonard Schwartz CC:         Ammie Dalton, M.D.   Operative Report  PREOPERATIVE DIAGNOSIS:       Chronic pelvic pain.  POSTOPERATIVE DIAGNOSES:      1. Chronic pelvic pain.                               2. Pelvic adhesions.                               3. Questionable endometriosis.  PROCEDURE:                    1. Diagnostic laparoscopy.                               2. Laparoscopic pelvic biopsies.                               3. Laparoscopic lysis of adhesions.  SURGEON:                      Janine Limbo, M.D.  ASSISTANT:                    None.  ANESTHESIA:                   General.  DISPOSITION:                  Ms. Hittle is a 45 year old female who presents with a history of chronic pelvic pain. She understands the indications for her laparoscopy and she accepts the risk of, but not limited to, anesthetic complications, bleeding, infections, and possible damage to the surrounding organs. She understands that no guarantees can be given concerning the total relief of discomfort.  FINDINGS:                     The uterus, fallopian tubes, and ovaries were normal. The fimbriated ends of the fallopian tubes were delicate bilaterally. There were red, hyperpigmented, slightly-raised lesions on the right uterosacral ligament in a linear fashion. There was a tan, slightly raised lesion measuring less than 0.5 cm in the mid posterior cul-de-sac. There were filmy adhesions between the large bowel and the left pelvic sidewall. The appendix, and upper abdomen appeared normal.  ESTIMATED BLOOD LOSS:         10 cc.  DESCRIPTION OF PROCEDURE:     The patient was taken to the operating room where a general anesthetic was given. The patients abdomen, perineum, and vagina were prepped in  multiple layers of Betadine. Examination under anesthesia was performed. A Foley catheter was placed in the bladder. A Hulka tenaculum was placed inside the ______ . The patient was sterilely draped. The subumbilical area was injected with 5 cc of 0.5% Marcaine without epinephrine. A subumbilical incision was made. The Veress needle was inserted without difficulty. Proper placement was confirmed using the saline drop test. A pneumoperitoneum was then obtained. The  laparoscopic trocar and then the laparoscope were substituted for the Veress needle. The pelvic structures were visualized with findings as mentioned above. The bowel was carefully inspected and there was no evidence of trocar damage. The suprapubic area was injected with 5 cc of 0.5% Marcaine and a 5 mm trocar was placed in the abdomen without difficulty. Pictures were taken of the patients pelvic and abdominal anatomy. The hyperpigmented lesions on the right uterosacral ligament and the tan, slightly raised lesion in the posterior cul-de-sac were then injected with lactated Ringers. Biopsies were obtained from both of these areas. The adhesions between the bowel and the left pelvic sidewall were lysed using a combination of sharp and blunt dissection. Care was taken not to damage any of the vital underlying structures. The pelvis was then vigorously irrigated. Hemostasis was noted to be adequate throughout. The pneumoperitoneum was allowed to escape. All instruments were removed after the bowel, again, was carefully inspected. The incisions were closed using deep and superficial sutures of 4-0 Vicryl. Sponge, needle, and instrument counts were correct. The estimated blood loss was 10 cc. The patient tolerated her procedure well. She was awakened from her anesthetic and taken to the recovery room in stable condition. The patient was given one gram of IV Cefotan prior to her surgery and she was given 30 mg of IV Toradol as her  surgery was being completed.  FOLLOWUP INSTRUCTIONS:        The patient was given a prescription for Vicodin, one to two p.o. q.4h. p.r.n. pain. She was also given doxycycline 100 mg b.i.d. for 10 days. She will return to see Dr. Stefano Gaul in two weeks for followup examination. She was given a copy of the postoperative instruction sheet as prepared by the [facility] for patients who have undergone laparoscopy. DD:  06/26/00 TD:  06/27/00 Job: [id number]

## 2010-10-03 NOTE — Op Note (Signed)
NAME:  Alexis [name], Alexis [name]                        ACCOUNT NO.:  [id number]   MEDICAL RECORD NO.:  [id number]                   PATIENT TYPE:  AMB   LOCATION:  DAY                                  FACILITY:  [facility]   PHYSICIAN:  Jamison Neighbor, M.D.               DATE OF BIRTH:  1965/11/04   DATE OF PROCEDURE:  12/01/2002  DATE OF DISCHARGE:                                 OPERATIVE REPORT   PREOPERATIVE DIAGNOSES:  1. Right flank and pelvic pain.  2. Possible interstitial cystitis.   POSTOPERATIVE DIAGNOSES:  Bilateral ureterocele with ureteral obstruction.   PROCEDURE:  1. Cystoscopy.  2. Urethral calibration.  3. Cystogram to rule out reflux.  4. Left retrograde, attempted right retrograde.  5. Bilateral ureterocele unroofing followed by right retrograde.  6. Bilateral double-J catheter insertion.   SURGEON:  Jamison Neighbor, M.D.   ANESTHESIA:  General.   COMPLICATIONS:  None.   DRAINS:  Bilateral 7 French x 26 cm double-J catheters.   BRIEF HISTORY:  This 45 year old female had previously been seen and  evaluated by Loraine Leriche C. Vernie Ammons, M.D.  The patient had had problems with left-  sided flank pain by history, although now she notes that the pain is worse  on the right-hand side.  The patient was evaluated by Dr. Vernie Ammons with a KUB  and a renal ultrasound which was normal other than what appeared to be a  simple left renal cyst.  There was no evidence of hydronephrosis or other  abnormalities.  The patient was told the pain was likely musculoskeletal.  The patient now presents with pain on the right-hand side as well as pain  with voiding, urgency, and nocturia x 2, and a history of dyspareunia.  The  patient also says she has had pelvic and low back pain for approximately two  years.  She was told based on her examination, that she might have  interstitial cystitis.  The patient's flank pain was of undetermined  etiology, but is was felt that retrograde evaluation would  be appropriate.  Full and informed consent was obtained for her procedure.   DESCRIPTION OF PROCEDURE:  After the successful induction of general  anesthesia, the patient was placed in the dorsal lithotomy position, prepped  with Betadine, and draped in the usual sterile fashion.  The urethra was  calibrated at 34 [place] with female urethral sounds, as it did require a  slight dilation to get from 28 to 32, and there was slight bleeding at the  urethral meatus.  The cystoscope was inserted.  The bladder was carefully  inspected.  It was free of any tumor or stones.  On the right-hand side, the  ureteral orifice was obscured by what appeared to be a clear-cut  ureterocele.  This was washed and would then dilate, but urine was not seen  to efflux from this in a normal fashion.  Attempts at  retrograde on that  side were initially unsuccessful.  On the left-hand side, there was also a  ureterocele.  Though with some difficulty, a wire was passed into a stenotic  opening and then passed up into the kidney.  The retrograde study on that  side showed what appeared to be normal upper tracts with a relatively normal  ureter that tapered down to this opening.  The patient has complained about  pain on the right-hand side and it was thought that unroofing the  ureterocele would make sense.  For that reason, the cystoscope was removed.  The resectoscope was inserted with a Collins knife in place, and this was  used to cut down over the dilated ureter.  When that opened up, blue dye  flushed out, and a catheter could easily be passed up to the kidney.  A  double-J catheter was left on that side.  Because the other side was still  stenotic even though a catheter had been passed, the decision was made to  place a double-J on that side as well.  A guidewire was passed, and then a  ureteral catheter was passed over the wire.  The [employer] was used to  unroof that slightly and open up that ureteral  opening.  The double-J  catheter was then passed up on that side as well.  On both sides, a 7 French  x 26 cm double-J catheter was placed through cuffs in the kidney and coiled  normally in the bladder.  The bladder had been filled during most of the  procedure, although not as a formal hydrodistention.  There was absolutely  no bleeding or other abnormalities detected and it was felt that since the  patient had had significant pathology detected with looking for additional  problems with a hydrodistention was not necessary.  If it turns out that the  patient has persistent, clear-cut bladder pain as opposed to flank pain, she  can be evaluated further with a potassium provocation test.  The patient  tolerated the procedure well and was taken to the recovery room in good  condition.  She received preoperative Ancef as well as postoperative  prescriptions for Pyridium Plus and Macrodantin.  The patient already has  Keppra for pain medication.                                               Jamison Neighbor, M.D.    RJE/MEDQ  D:  12/01/2002  T:  12/01/2002  Job:  [id number]   cc:   Roseanna Rainbow, M.D.  [address]  [place]  [place] [address]  Fax: [phone]

## 2010-10-03 NOTE — Procedures (Signed)
[facility]  Patient:    Alexis [name], Alexis [name]                     MRN: [id number] Proc. Date: 12/27/00 Adm. Date:  [id number] Attending:  Rich Brave CC:         Ammie Dalton, M.D.   Procedure Report  PROCEDURE:  Upper endoscopy with Savary dilatation of the esophagus.  ENDOSCOPIST:  Florencia Reasons, M.D.  INDICATIONS:  None specific.  Multiple abdominal symptoms including reflux, and a sense of food "lodging" in her throat.  FINDINGS:  Essentially normal examination.  Minimal hiatal hernia present.  DESCRIPTION OF PROCEDURE:  The risks of esophageal perforation were discussed with the patient.  She provided a written consent.  Sedation was fentanyl 125 mcg and Versed 12.5 mg IV for this procedure and the colonoscopy which immediately followed it.  The Olympus small caliber adult videoscope GIF-160 was passed under direct vision.  The vocal cords were not well seen.  The esophagus was readily entered.  The esophageal mucosa was normal.  No gastroesophageal reflux was observed during this examination.  There was no evidence of reflux esophagitis, Barretts esophagus, varices, infection, neoplasia, or any ring or stricture.  A 1.0 to 2.0 cm hiatal hernia was present.  The stomach contained no significant residual and had normal mucosa, without evidence of gastritis, erosions, ulcers, polyps, or masses.  A retroflexed view of the proximal stomach was normal, except for a small hiatal hernia.  The pylorus and duodenal bulb and second duodenum were normal.  Empiric Savary dilatation was performed in the standard fashion.  The spring-tipped guide wire was passed into the antrum of the stomach, and the scope was removed in a strange fashion, after which a single passage was made with a 18 mm Savary dilator under fluoroscopic guidance, confirming passage of the widest portion of the dilator below the level of the diaphragm.  The  dilator and the guide wire were removed.  The patient was re-endoscoped under direct vision.  There was a little bit of mucosal abrasion in the proximal esophagus just below the cricopharyngeus, but no other trauma was observed, and in particular there was no evidence of any disruption in the distal esophageal region to suggest a fracture of an esophageal ring, nor was there evidence of undue trauma to the stomach.  The scope was then removed from the patient, who tolerated the procedure well, and without apparent complication.  IMPRESSION:  Unremarkable upper endoscopy, without definite source of the patients symptoms endoscopically evident.  PLAN:  Clinical followup of symptoms, to see if this dilatation has helped. DD:  12/27/00 TD:  12/27/00 Job: [id number]

## 2010-10-03 NOTE — Op Note (Signed)
NAME:  Alexis [name], Alexis [name]                        ACCOUNT NO.:  [id number]   MEDICAL RECORD NO.:  [id number]                   PATIENT TYPE:  AMB   LOCATION:  [facility]                                 FACILITY:  [facility]   PHYSICIAN:  Jamison Neighbor, M.D.               DATE OF BIRTH:  [birth year]   DATE OF PROCEDURE:  05/08/2003  DATE OF DISCHARGE:                                 OPERATIVE REPORT   SERVICE:  Urology.   PREOPERATIVE DIAGNOSES:  1. Interstitial cystitis.  2. Flank pain.  3. History of ureterocele.   POSTOPERATIVE DIAGNOSES:  1. Interstitial cystitis.  2. Flank pain.  3. History of ureterocele.   PROCEDURE:  Cystoscopy, bilateral retrogrades with interpretation,  cystogram, hydrodistention, Marcaine and Pyridium installation, Marcaine and  Kenalog injection.   SURGEON:  Jamison Neighbor, M.D.   ANESTHESIA:  General.   COMPLICATIONS:  None.   DRAINS:  None.   FINDINGS:  1. Normal retrogrades without obstruction or hydronephrosis.  2. Normal cystogram without reflux.  3. Confirmation of interstitial cystitis.   HISTORY:  This 45 year old female has a somewhat complex situation.  The  patient was felt to have interstitial cystitis based on symptoms of urgency,  frequency and pain and she did have a positive potassium test.  The patient  was scheduled to undergo cystoscopy in order to confirm her diagnosis and  hopefully help her situation and was found at the time of her initial  cystoscopic exam to have evidence of an obstructing ureterocele. This was  unroofed and the patient did have some resolution of her flank pain.   The patient has been started on appropriate therapy for interstitial  cystitis but still suffers from lower urinary tract symptoms as well as the  development of some right sided pain whenever she drinks fluids.  It was  thought that the patient might have evidence of obstruction. She has had a  pelvic ultrasound, renal ultrasound, KUB and renal  scan which were normal.  The patient had normal ovaries and a normal uterus.  She had no post void  residual, she had no hydronephrosis. The KUB showed no stones and the renal  scan showed no evidence of obstruction. The patient is now to undergo  hydrodistention not so much for diagnostic purposes but hopefully for  therapy since she is yet to respond to her medications.  We do note that  installation therapy is somewhat helpful. The patient hopes to have some  temporary improvement in her situation. We also will determine if she has  any reflux following the ureterocele to see if that might be contributing to  the patient's pain. She understands the risks and benefits of the procedure  and gave full and informed consent.   DESCRIPTION OF PROCEDURE:  After successful induction of general anesthesia,  the patient was placed in the dorsal lithotomy position, prepped with  Betadine and draped in the usual  sterile fashion.  Inspection of the  genitalia revealed no abnormalities, careful bimanual exam showed no  significant cystocele, rectocele or enterocele. The urethra was palpably  normal with no signs of a diverticulum or any other suburethral mass. The  urethra was calibrated to a 18 [place] with female urethral sounds with no  signs of stenosis or stricture. The cystoscope was inserted, the bladder was  carefully inspected and was free of any tumor or stones. The ureteral  orifices were actually normal in appearance and there was no sign of any  ureterocele. Thee also was no real evidence of a gapping ureter that would  suggest reflux.  Retrograde studies were performed bilaterally.  The upper  tracts were normal on both sides. The ureters were unremarkable in their  appearance, they were thin with no signs of hydroureteronephrosis.  The  collecting system itself was unremarkable.  On both sides, there was nice  cupping of the caliceal system with no signs of obstruction. There were no   filling defects on either side. Drain out films were obtained and showed  good drainage of the pelvis and ureter.  The patient then underwent  hydrodistention at a pressure of 100 cm of water for five minutes and when  the bladder was drained, the patient had glomerulations throughout the  bladder consistent with interstitial cystitis but fortunately had a nice  bladder capacity of approximately 1200 mL which is normal.  This would  suggest that the patient has not yet lost any bladder capacity and should do  well over time with a combination of installation therapy and oral therapy.  The patient underwent a cystogram to rule out reflux and there was no sign  of reflux whatsoever.  The bladder was smooth and there was no evidence of  any abnormalities on that study.  The drain out films were also normal.  The  patient had a mixture of Marcaine and Pyridium left within the bladder.  Marcaine and Kenalog were injected periurethrally. The patient received a  B&O suppository at the beginning of the procedure as well as preoperative  antibiotics and intraoperative Toradol and Zofran to try and prevent  postoperative pain.  The patient tolerated the procedure well and was taken  to the recovery room in good condition.  She will continue on her triple  therapy of Elmiron, Atarax and Elavil with modifications depending on her  results to the therapy.  She will also continue with Urelle for symptomatic  relief and she used hydrocodone for postoperative pain.  She was given a  prescription for Levaquin for three days. She will return to the office in  followup in three weeks time for review of these findings.                                               Jamison Neighbor, M.D.    RJE/MEDQ  D:  05/08/2003  T:  05/08/2003  Job:  [id number]   cc:   Alexis [name], M.D.  [address]  [place]  [place] [address]  Fax: [phone]

## 2010-10-03 NOTE — H&P (Signed)
[facility]  Patient:    Alexis [name], Alexis [name] Visit Number: [id number] MRN: [id number]          Service Type: MED Location: [facility] Attending Physician:  Jaymes Graff A Dictated by:   Pierre Bali Normand Sloop, M.D. Admit Date:  02/11/2001 Discharge Date: 02/11/2001                           History and Physical  ADDENDUM (to history and physical written on January 31, 2001)  HISTORY OF PRESENT ILLNESS:   Patient is a 45 year old African-American female, G1, P1, with questionable LMP because patient was on Depo-Provera who presented for preoperative exam for removal of recurrent perineal abscess. She stated that the area was oozing some white material, denies any fevers or chills, and completed a course of Keflex.  PAST GYNECOLOGIC HISTORY:     Significant for menses at age 57 occurring every month, lasting for five days.  Patient has no history of sexually transmitted diseases or abnormal Pap smear, no ______  Her last Pap was done December 2001 and was found to be within normal limits.  History significant for endometriosis.  PAST OB HISTORY:              Significant for a full-term vaginal delivery in November 1989 without any complications.  PAST SURGICAL HISTORY:        Significant for a laparoscopy in February 2002, tonsillectomy in 1991, breast reduction, endoscopy, pilonidal cystectomy in 1983.  PAST MEDICAL HISTORY:         Significant for acid reflux and fibromyalgia. Patient has no known drug allergies.  CURRENT MEDICATIONS:          Depo-Provera, Tussionex, and ______  SOCIAL HISTORY:               Negative x 3.  PHYSICAL EXAMINATION:  VITAL SIGNS:                  Her blood pressure was 120/70, weight was 194 pounds.  GENERAL:                      A well-developed, well-nourished female in no apparent distress.  HEART:                        Regular.  LUNGS:                        Clear.  BREASTS:                      No  masses, or skin changes, or nipple retraction.  ABDOMEN:                      Soft and nontender.  PELVIC:                       Vulva, vagina - had a perineal abscess about 1 cm in size with a purulent material.  No erythema or crepitus.  Uterus was normal size, nontender, mobile.  EXTREMITIES:                  No cyanosis, clubbing, or edema.  LABORATORY DATA:              Pap was normal.  Cervical cultures were negative.  Hepatitis B and HIV negative.  IMPRESSION:                   Patient has a recurrent peritoneal abscess and endometriosis.  She is scheduled for surgical removal of perineal abscess, most likely a form of hidradenitis.  Patient understands the risks are not limited to bleeding, infection, recurrence of abscess, and damage to perineum. A ______ program application was done for Lupron. Dictated by:   Pierre Bali. Normand Sloop, M.D. Attending Physician:  Michael Litter DD:  03/27/01 TD:  03/27/01 Job: [id number]

## 2010-10-03 NOTE — H&P (Signed)
NAMEGLENDELL, SCHLOTTMAN NO.:  [id number]   MEDICAL RECORD NO.:  [id number]          PATIENT TYPE:  AMB   LOCATION:  [facility]                           FACILITY:  [facility]   PHYSICIAN:  Roseanna Rainbow, M.D.DATE OF BIRTH:  02/28/1966   DATE OF ADMISSION:  DATE OF DISCHARGE:                                HISTORY & PHYSICAL   CHIEF COMPLAINT:  The patient is a 45 year old status post a spontaneous  vaginal delivery on May 09, 2005 with postpartum bleeding and rule out  retained products of conception.   HISTORY OF PRESENT ILLNESS:  Please see the above. The patient was  hospitalized approximately 10 days prior with similar complaints. An  ultrasound at that point was suspicious for retained products in the lower  uterine segment above the internal os. These were felt to have been blood  clots and were removed with an empty ring sponge stick. Her hemoglobin  remained stable in the 9 range.  The patient initially responded to  uterotonics and was discharged to home. The bleeding became heavier over the  last several days. A repeat ultrasound on May 28, 2004 was suspicious  for retained products now located in the fundal region of the uterus,  measuring approximately 4 cm in diameter.   ALLERGIES:  SULFA.   MEDICATIONS:  Fioricet, Protonix.   PAST MEDICAL HISTORY:  1.  Fibromyalgia.  2.  Low back pain.  3.  Migraine headaches.  4.  Interstitial cystitis.  5.  Hiatal hernia.   PAST SURGICAL HISTORY:  Bladder surgery with placement of ureteral stents,  breast reduction.   PAST OB/GYN HISTORY:  She is status post two normal spontaneous vaginal  deliveries.   SOCIAL HISTORY:  She is separated. Does not give any significant history of  alcohol usage. Has no significant smoking history. Denies illicit drug use.   FAMILY HISTORY:  Remarkable for adult onset diabetes, hypertension, and  glaucoma.   PHYSICAL EXAMINATION:  VITAL SIGNS:  Stable, afebrile.  ABDOMEN:  The fundal height is approximately 14-16 weeks size.  PELVIC EXAM:  On speculum exam there was small heme noted.   ASSESSMENT:  1.  Rule out retained placental fragments with abnormal uterine bleeding.  2.  Mild to moderate anemia.   PLAN:  Planned procedure is a suction D&C with ultrasound guidance.      Roseanna Rainbow, M.D.  Electronically Signed     LAJ/MEDQ  D:  05/28/2005  T:  05/28/2005  Job:  [id number]

## 2010-10-03 NOTE — Op Note (Signed)
NAME:  Alexis [name], Alexis [name]                        ACCOUNT NO.:  [id number]   MEDICAL RECORD NO.:  [id number]                   PATIENT TYPE:  AMB   LOCATION:  DSC                                  FACILITY:  [facility]   PHYSICIAN:  Earvin Hansen L. Shon Hough, M.D.           DATE OF BIRTH:  07-10-1965   DATE OF PROCEDURE:  DATE OF DISCHARGE:                                 OPERATIVE REPORT   Patient with severe pain and discomfort involving the right medial  inframammary fold area has developed a mass area with increased drainage.  She has been treated with antibiotic therapy to no avail.   PROCEDURE:  Wide excision of the area with plastic closure.   ANESTHESIA:  General.   The patient underwent general anesthesia and intubated orally. Prep was done  to the chest and breast areas in routine fashion using Betadine soap and  solution, walled off with sterile towels and drape so as to make a sterile  field. Marcaine was used to outline the excision of the mass area which  measured approximately 6 cm in length, 2 cm in width. The mass was removed  after local anesthesia had been administered, Xylocaine 0.5% with  epinephrine a total of 50 cc. Dissection ws carried down through the  subcutaneous tissue with a #15 blade and using a Bovie unit it would go  deeper into the deep subcutaneous tissues to make sure we were totally  around the mass in its entirety. After appropriate hemostasis using Bovie  and coagulation, the superior and inferior flaps were freed significantly.  After proper hemostasis, irrigation was done to ensure more vasoconstriction  and blood control. The Bovie again was used in small areas. After this the  wound was then closed in layers using 3-0 Monocryl times two layers in the  subcu plane, a subdermal suture of 5-0 Monocryl, and then a  running  subcuticular suture of 3-0 Monocryl. The wounds were cleansed, Steri-Strips  and soft dressings were applied to all the areas including  Xeroform, 4x4's,  ABD, hyperfixed tape. The patient will be taken to the recovery room with  monitoring, ice packs applied. Postoperative instructions already given. She  is to see me back in the office within one week. She can do dressing changes  after the first 48 hours.                                               Yaakov Guthrie. Shon Hough, M.D.    Cathie Hoops  D:  10/29/2003  T:  10/29/2003  Job:  [id number]

## 2010-10-03 NOTE — H&P (Signed)
NAMECLARENCE, Alexis [name] NO.:  [id number]   MEDICAL RECORD NO.:  [id number]          PATIENT TYPE:  INP   LOCATION:  9169                          FACILITY:  [facility]   PHYSICIAN:  Roseanna Rainbow, M.D.DATE OF BIRTH:  Oct 19, 1965   DATE OF ADMISSION:  05/07/2005  DATE OF DISCHARGE:                                HISTORY & PHYSICAL   CHIEF COMPLAINT:  The patient is a 45 year old para 1 with an estimated date  of confinement of May 08, 2005, who presents for induction of labor.   HISTORY OF PRESENT ILLNESS:  Please see the above.   ALLERGIES:  SULFA.   MEDICATIONS:  Prenatal vitamins, Protonix, Fioricet.   RISK FACTORS:  Uterine fibroids, advanced maternal age.   LABORATORY:  Blood type is A positive, antibody screen negative. Hemoglobin  11, hematocrit 33.2. Platelet count is 372,000. Chlamydia negative, GC  negative. One-hour GCT 76. Hepatitis B surface antigen negative. HIV  negative. RPR nonreactive. Rubella immune. Sickle cell negative. Urine  culture and sensitivity no growth.   PAST MEDICAL HISTORY:  Remarkable for fibromyalgia, low back pain, migraine  headaches, interstitial cystitis, hiatal hernia.   PAST SURGICAL HISTORY:  Bladder surgery with stents, breast reduction.   PAST OBSTETRICAL AND GYNECOLOGICAL HISTORY:  She is status post previous  NSVD.   SOCIAL HISTORY:  Marital history:  Separated. Does not give any significant  history of alcohol usage. Has no significant smoking history. Denies illicit  drug use.   FAMILY HISTORY:  Adult-onset diabetes, hypertension, glaucoma.   PHYSICAL EXAMINATION:  VITAL SIGNS:  Stable, afebrile. Blood pressure  139/88. Fetal heart tracing reassuring. Tocodynamometer:  Irregular uterine  contractions.  GENERAL:  Well-developed, no apparent distress.  ABDOMEN:  Gravid.  PELVIC:  Sterile vaginal exam per the R.N. is fingertip and 50%.   ASSESSMENT:  Primipara at term for induction of labor. Borderline  Bishop's  score. Fetal heart tracing consistent with fetal well-being. Borderline  initial blood pressure elevation.   PLAN:  Admission. Will being with cervical ripening to be followed by  Pitocin.      Roseanna Rainbow, M.D.  Electronically Signed     LAJ/MEDQ  D:  05/08/2005  T:  05/08/2005  Job:  [id number]

## 2010-10-03 NOTE — Procedures (Signed)
[place]  Patient:    Alexis [name], Alexis [name]                       MRN: [id number] Proc. Date: 12/27/00 Attending:  Florencia Reasons, M.D. CC:         Ammie Dalton, M.D.   Procedure Report  PROCEDURE:  Colonoscopy with polypectomy.  INDICATION:  Nonspecific abdominal pain in a 45 year old female.  FINDINGS:  A 4 mm polyp snared at 35 cm.  PROCEDURE:  The patient provided written consent for the procedure, which was done immediately following her upper endoscopy with total sedation for the two procedures being fentanyl 125 mcg and Versed 12.5 mg IV.  The Olympus adult video colonoscope was advanced to the terminal ileum, turning the patient into the supine position to facilitate advancement through the mid and proximal portions of the colon.  The terminal ileum had a normal appearance.  Pullback was then performed.  The quality of the prep was very good and it was felt that all areas were well seen.  The only abnormality on this examination was a 4 mm semipedunculated polyp snared off at about 35 cm and fragments were retrieved for histologic analysis.  There was complete hemostasis and no evidence of excessive cautery.  No other polyps were seen and there was no evidence of colitis, vascular malformations, cancer or diverticular disease.  Retroflexion in the rectum was unremarkable.  The patient tolerated the procedure well and there were no apparent complications.  IMPRESSION:  Small colonic polyp removed as described above.  PLAN:  Await pathology.  Anticipate colonoscopic follow-up in about five years if the polyp is adenomatous in character.  Note that no source for the patients abdominal pain was endoscopically evident on this examination. DD:  12/27/00 TD:  12/27/00 Job: [id number]

## 2010-10-03 NOTE — Op Note (Signed)
NAMESHALONDRA, WUNSCHEL              ACCOUNT NO.:  [id number]   MEDICAL RECORD NO.:  [id number]          PATIENT TYPE:  AMB   LOCATION:  NESC                         FACILITY:  [facility]   PHYSICIAN:  Jamison Neighbor, M.D.  DATE OF BIRTH:  23-Jul-1965   DATE OF PROCEDURE:  06/04/2005  DATE OF DISCHARGE:                                 OPERATIVE REPORT   PREOPERATIVE DIAGNOSES:  1.  Abdominal pain.  2.  History of interstitial cystitis and history of left ureterocele.   POSTOPERATIVE DIAGNOSES:  1.  Abdominal pain.  2.  History of interstitial cystitis and history of left ureterocele.   PROCEDURE:  1.  Cystoscopy.  2.  Urethral calibration.  3.  Hydrodistention of the bladder.  4.  Bilateral retrogrades with interpretation.  5.  Marcaine and Pyridium instillation.  6.  Marcaine and Kenalog injection.   SURGEON:  Dr. Logan Bores   ANESTHESIA:  General.   COMPLICATIONS:  None.   DRAINS:  None.   BRIEF HISTORY:  This 45 year old female has a known history of interstitial  cystitis.  The patient has had some abdominal pain, worse than on the left  than on the right.  She is to undergo repeat diagnostic hydrodistention to  determine the status of her bladder.  She is also to undergo retrograde  studies because of persistent left flank pain in the presence of a history  of previous ureterocele.  The patient understands the risks and benefits of  the procedure and gave full informed consent.   DESCRIPTION OF PROCEDURE:  After the successful induction of general  anesthesia, the patient was placed in the dorsolithotomy position and  prepped with Betadine, draped in the usual sterile fashion.  Careful  bimanual examination revealed no significant cystocele, rectocele, or  enterocele.  There were no masses on bimanual exam.  The urethra was of  normal caliber.  The cystoscope was inserted, and the bladder was carefully  inspected.  It was free of any tumor or stones.  Both ureteral orifices  were  normal in configuration and location.  The only exception was the fact that  the left ureter had been previously opened when she had a ureterocele, but  no ureterocele could be seen at this time.  Bilateral retrograde studies  were performed.  The formal findings will be placed in a paragraph at the  end of the procedure.  After the completion of the bilateral retrograde,  hydrodistention was performed.  The bladder was distended at a pressure of  100 cm of water for 5 minutes.  When the bladder was drained, the bladder  capacity was normal at 1400 mL.  Minimal glomerulations could be seen.  No  ulcers could be seen, and the bladder was significantly improved over the  previous appearance.  The bladder was drained.  A mixture of Marcaine and  Pyridium was left within the bladder.  Marcaine and Kenalog were injected  periurethrally.  The patient was taken to the recovery room in good  condition.   RETROGRADE PYELOGRAM:  The patient underwent retrograde studies bilaterally.  A 5 French ureteral  catheter was inserted in the right ureter, and a  retrograde study was performed.  The ureter was of normal caliber with no  filling defects seen.  The collecting system was confluent and unremarkable.  Drain-out films showed no obstruction of any kind.  On the left-hand side,  the ureteral orifice was slightly irregular because of the previous  ureteroscope, but there was no obstruction, and it appeared to be a single  unit.  The ureter was normal.  The collecting system was unremarkable,  although there was clearly a little bit less of a mid pole section, and this  kidney appears to be slightly smaller, perhaps a congenital defect  associated with the ureteroscope.  There was no sign whatsoever, however, of  a second ureter.  The drain-out film on that side was normal as well.           ______________________________  Jamison Neighbor, M.D.  Electronically Signed     RJE/MEDQ  D:   06/09/2005  T:  06/09/2005  Job:  [id number]

## 2010-10-03 NOTE — Op Note (Signed)
NAME:  Alexis [name], Alexis [name]                        ACCOUNT NO.:  [id number]   MEDICAL RECORD NO.:  [id number]                   PATIENT TYPE:  AMB   LOCATION:  SDC                                  [facility]   PHYSICIAN:  Roseanna Rainbow, M.D.         DATE OF BIRTH:  1965/08/16   DATE OF PROCEDURE:  12/06/2003  DATE OF DISCHARGE:  12/06/2003                                 OPERATIVE REPORT   PREOPERATIVE DIAGNOSIS:  Rule out imbedded intrauterine device.   POSTOPERATIVE DIAGNOSIS:  Rule out imbedded intrauterine device.   PROCEDURE:  Operative hysteroscopy with removal of IUD.   SURGEON:  Roseanna Rainbow, M.D.   ANESTHESIA:  General endotracheal.   ESTIMATED BLOOD LOSS:  Less than 50 mL.   COMPLICATIONS:  None.   DESCRIPTION OF PROCEDURE:  The patient was taken to the operating room.  General anesthesia was induced without difficulty.  She was placed in the  dorsal lithotomy position and prepped and draped in the usual sterile  fashion.  The bladder was catheterized.  Sims retractors were placed into  the vagina for retraction.  The anterior lip of the cervix was grasped with  a single tooth tenaculum.  The cervix was then dilated with [facility] dilators.  The operative hysteroscope was then introduced and the endometrium appeared  somewhat lush.  No lesions were noted.  The IUD was visualized.  The IUD was  grasped with a grasper and removed.  The single tooth tenaculum was the  removed with minimal bleeding noted from the cervix.  At the close of the  procedure the instrument and pack counts were said to be correct x2.  The  patient was taken to the PACU awake and in stable condition.                                               Roseanna Rainbow, M.D.    [name]  D:  01/17/2004  T:  01/18/2004  Job:  [id number]

## 2010-10-03 NOTE — Op Note (Signed)
NAMESHAKESHA, SOLTAU              ACCOUNT NO.:  [id number]   MEDICAL RECORD NO.:  [id number]          PATIENT TYPE:  AMB   LOCATION:  SDC                           [facility]   PHYSICIAN:  Roseanna Rainbow, M.D.DATE OF BIRTH:  December 22, 1965   DATE OF PROCEDURE:  05/29/2005  DATE OF DISCHARGE:                                 OPERATIVE REPORT   PREOPERATIVE DIAGNOSIS:  Rule out retained products of conception, retained  placental fragment.   POSTOPERATIVE DIAGNOSIS:  Retained placental fragment.   PROCEDURE:  Suction dilatation and curettage.   SURGEON:  Roseanna Rainbow, M.D.   ANESTHESIA:  Managed anesthesia care, paracervical block.   ESTIMATED BLOOD LOSS:  Minimal.   COMPLICATIONS:  None.   PROCEDURE:  The patient was taken to the operating room with an IV running.  She was then placed in the dorsal lithotomy position and prepped and draped  in the usual sterile fashion.  Sims retractors were placed into the vagina.  The cervix was noted to be 1 cm dilated.  The anterior lip of the cervix was  infiltrated with 2% lidocaine.  The single-tooth tenaculum was then applied  to this location and 4 mL of lidocaine were injected at 5 and 7 o'clock to  produce a paracervical block.  A 10 mm suction curette was advanced into the  uterine fundus using ultrasound guidance.  Several passes were made to  remove the placental fragment.  A sharp curettage was performed and a gritty  texture was noted.  The single-tooth tenaculum was removed.  There was some  bleeding noted from the cervix.  Silver nitrate was applied as well as  pressure.  Adequate hemostasis was noted.  At the close of the procedure,  the instrument and pack counts were said to be correct x2.  The patient was  taken to the PACU awake and in stable condition.      Roseanna Rainbow, M.D.  Electronically Signed     LAJ/MEDQ  D:  05/29/2005  T:  05/29/2005  Job:  [id number]

## 2010-10-03 NOTE — H&P (Signed)
[facility]  Patient:    Alexis [name], Alexis [name]                       MRN: [id number] Adm. Date:  06/26/00 Attending:  Janine Limbo, M.D. CC:         Ammie [name], M.D.   History and Physical  HISTORY OF PRESENT ILLNESS:   Alexis [name] is a 45 year old female, para 0-0-1-0, who presents for diagnostic laparoscopy because of chronic pelvic pain.  The patient had an ultrasound on May 07, 2000 that showed a 7.7 cm x 4.9 cm uterus.  The ovaries appeared normal.  No abnormal masses were appreciated.  The patient had cultures for gonorrhea, and chlamydia that were negative.  The patient has been evaluated by her family physician and no etiology has been found for her discomfort.  She wants to proceed at this time with diagnostic laparoscopy.  She denies a past history of GYN surgery other than an elective pregnancy termination.  She denies a history of sexually transmitted infections.  She has had constipation in the past.  PAST MEDICAL HISTORY:         The patient has a history of sickle cell trait.  DRUG ALLERGIES:               None known.  SOCIAL HISTORY:               The patient denies cigarette use, alcohol use, and recreational drug uses.  REVIEW OF SYSTEMS:            The patient has had constipation in the past. She had a pilonidal cyst removed in 1983.  FAMILY HISTORY:               Noncontributory.  PHYSICAL EXAMINATION:  VITAL SIGNS:                  Weight is 204 pounds.  HEENT:                        Within normal limits.  CHEST:                        Clear.  HEART:                        Regular rate and rhythm.  BREASTS:                      Without masses.  ABDOMEN:                      Nontender.  EXTREMITIES:                  Within normal limits.  NEUROLOGIC:                   Normal.  PELVIC:                       External genitalia is normal.  The vagina is normal.  Cervix is nontender.  Uterus is 8-week  size and nontender.  Adnexa - no masses.  Rectovaginal exam confirms.  ASSESSMENT:                   Chronic pelvic pain.  PLAN:  The patient will undergo diagnostic laparoscopy. She understands the indications for her procedure and she accepts the risks of, but not limited to, and esthetic complications, bleeding, infections, and possible damage to the surrounding organs.  She understands that no guarantees can be given concerning the total relief of her discomfort. DD:  06/25/00 TD:  06/25/00 Job: [id number]

## 2010-10-03 NOTE — Op Note (Signed)
NAME:  Alexis [name], Alexis [name]                        ACCOUNT NO.:  [id number]   MEDICAL RECORD NO.:  [id number]                   PATIENT TYPE:  AMB   LOCATION:  SDC                                  [facility]ACILITY[facility]   PHYSICIAN:  Roseanna Rainbow, M.D.         DATE OF BIRTH:  1965-07-01   DATE OF PROCEDURE:  12/06/2003  DATE OF DISCHARGE:                                 OPERATIVE REPORT   PREOPERATIVE DIAGNOSES:  Rule out imbedded IUD.   POSTOPERATIVE DIAGNOSES:  Rule out imbedded IUD.   PROCEDURE:  Operative hysteroscopy with removal of IUD.   SURGEON:  Roseanna Rainbow, M.D.   ANESTHESIA:  General endotracheal.   COMPLICATIONS:  None.   ESTIMATED BLOOD LOSS:  50 mL.   DESCRIPTION OF PROCEDURE:  The patient was taken to the operating room, a  general anesthetic was administered without difficulty. The patient was  placed in the dorsal lithotomy position and prepped and draped in the usual  sterile fashion.  A weighted speculum was placed in the vagina as well as  the Sims retractor. The anterior lip of the cervix was grasped with a single  tooth tenaculum.  The uterus sounded to 8 cm.  The cervix was dilated with  Shawnie Pons dilators. The operative scope was then introduced into the uterine  cavity.  Glycine was the distending medium. A survey of the endometrial  cavity revealed lush appearing endometrium.  No discreet lesions were noted.  The IUD was visualized and removed with a grasper.  The single tooth  tenaculum was then removed from the cervix with minimal bleeding noted from  the cervix. The patient tolerated the procedure well.  At the close of the  procedure, the instrument and pack counts were said to be correct x2. The  patient was taken to the PACU awake and in stable condition.                                               Roseanna Rainbow, M.D.    [name]  D:  12/06/2003  T:  12/06/2003  Job:  [id number]

## 2010-10-27 ENCOUNTER — Emergency Department (HOSPITAL_COMMUNITY): Payer: Medicaid Other

## 2010-10-27 ENCOUNTER — Emergency Department (HOSPITAL_COMMUNITY)
Admission: EM | Admit: 2010-10-27 | Discharge: 2010-10-27 | Disposition: A | Discharge status: Home / Self Care | Payer: Medicaid Other | Attending: Emergency Medicine | Admitting: Emergency Medicine

## 2010-10-27 DIAGNOSIS — K449 Diaphragmatic hernia without obstruction or gangrene: Secondary | ICD-10-CM | POA: Insufficient documentation

## 2010-10-27 DIAGNOSIS — K5900 Constipation, unspecified: Secondary | ICD-10-CM | POA: Insufficient documentation

## 2010-10-27 DIAGNOSIS — Z85819 Personal history of malignant neoplasm of unspecified site of lip, oral cavity, and pharynx: Secondary | ICD-10-CM | POA: Insufficient documentation

## 2010-10-27 DIAGNOSIS — IMO0001 Reserved for inherently not codable concepts without codable children: Secondary | ICD-10-CM | POA: Insufficient documentation

## 2010-10-27 DIAGNOSIS — D279 Benign neoplasm of unspecified ovary: Secondary | ICD-10-CM | POA: Insufficient documentation

## 2010-10-27 DIAGNOSIS — D573 Sickle-cell trait: Secondary | ICD-10-CM | POA: Insufficient documentation

## 2010-10-27 DIAGNOSIS — R109 Unspecified abdominal pain: Secondary | ICD-10-CM | POA: Insufficient documentation

## 2010-10-27 DIAGNOSIS — Z79899 Other long term (current) drug therapy: Secondary | ICD-10-CM | POA: Insufficient documentation

## 2010-10-27 DIAGNOSIS — IMO0002 Reserved for concepts with insufficient information to code with codable children: Secondary | ICD-10-CM | POA: Insufficient documentation

## 2010-10-27 LAB — URINE MICROSCOPIC-ADD ON

## 2010-10-27 LAB — URINALYSIS, ROUTINE W REFLEX MICROSCOPIC
Bilirubin Urine: NEGATIVE
Glucose, UA: NEGATIVE mg/dL
Ketones, ur: NEGATIVE mg/dL
Leukocytes, UA: NEGATIVE
Nitrite: NEGATIVE
Protein, ur: NEGATIVE mg/dL
Specific Gravity, Urine: 1.012 (ref 1.005–1.030)
Urobilinogen, UA: 0.2 mg/dL (ref 0.0–1.0)
pH: 8 (ref 5.0–8.0)

## 2010-10-27 LAB — DIFFERENTIAL
Basophils Absolute: 0 10*3/uL (ref 0.0–0.1)
Basophils Relative: 0 % (ref 0–1)
Eosinophils Absolute: 0.1 10*3/uL (ref 0.0–0.7)
Eosinophils Relative: 1 % (ref 0–5)
Lymphocytes Relative: 15 % (ref 12–46)
Lymphs Abs: 1.7 10*3/uL (ref 0.7–4.0)
Monocytes Absolute: 1 10*3/uL (ref 0.1–1.0)
Monocytes Relative: 9 % (ref 3–12)
Neutro Abs: 8.4 10*3/uL — ABNORMAL HIGH (ref 1.7–7.7)
Neutrophils Relative %: 75 % (ref 43–77)

## 2010-10-27 LAB — CBC
HCT: 29.2 % — ABNORMAL LOW (ref 36.0–46.0)
Hemoglobin: 10.1 g/dL — ABNORMAL LOW (ref 12.0–15.0)
MCH: 30.8 pg (ref 26.0–34.0)
MCHC: 34.6 g/dL (ref 30.0–36.0)
MCV: 89 fL (ref 78.0–100.0)
Platelets: 312 10*3/uL (ref 150–400)
RBC: 3.28 MIL/uL — ABNORMAL LOW (ref 3.87–5.11)
RDW: 13.6 % (ref 11.5–15.5)
WBC: 11.2 10*3/uL — ABNORMAL HIGH (ref 4.0–10.5)

## 2010-10-27 LAB — POCT PREGNANCY, URINE: Preg Test, Ur: NEGATIVE

## 2010-10-27 LAB — BASIC METABOLIC PANEL
BUN: 10 mg/dL (ref 6–23)
CO2: 31 mEq/L (ref 19–32)
Calcium: 8.4 mg/dL (ref 8.4–10.5)
Chloride: 103 mEq/L (ref 96–112)
Creatinine, Ser: 0.67 mg/dL (ref 0.4–1.2)
GFR calc Af Amer: >60 mL/min (ref >60)
GFR calc non Af Amer: >60 mL/min (ref >60)
Glucose, Bld: 95 mg/dL (ref 70–99)
Potassium: 3.7 mEq/L (ref 3.5–5.1)
Sodium: 140 mEq/L (ref 135–145)

## 2010-10-27 MED ORDER — IOHEXOL 300 MG/ML  SOLN
80.0000 mL | Freq: Once | INTRAMUSCULAR | Status: AC | PRN
  Administered 2010-10-27: 80 mL via INTRAVENOUS

## 2011-02-20 LAB — COMPREHENSIVE METABOLIC PANEL
ALT: 13
AST: 12
Albumin: 3.7
Alkaline Phosphatase: 60
BUN: 11
CO2: 27
Calcium: 9
Chloride: 101
Creatinine, Ser: 0.7
GFR calc Af Amer: >60
GFR calc non Af Amer: >60
Glucose, Bld: 94
Potassium: 3.7
Sodium: 135
Total Bilirubin: 0.7
Total Protein: 7.2

## 2011-02-20 LAB — DIFFERENTIAL
Basophils Absolute: 0
Basophils Relative: 0
Eosinophils Absolute: 0 — ABNORMAL LOW
Eosinophils Relative: 0
Lymphocytes Relative: 14
Lymphs Abs: 2.2
Monocytes Absolute: 1.2 — ABNORMAL HIGH
Monocytes Relative: 7
Neutro Abs: 13.2 — ABNORMAL HIGH
Neutrophils Relative %: 79 — ABNORMAL HIGH

## 2011-02-20 LAB — POCT CARDIAC MARKERS
CKMB, poc: <1 — ABNORMAL LOW
Myoglobin, poc: 34.3
Operator id: 196461
Troponin i, poc: <0.05

## 2011-02-20 LAB — CBC
HCT: 39.3
Hemoglobin: 13
MCHC: 33.2
MCV: 88
Platelets: 398
RBC: 4.47
RDW: 15
WBC: 16.7 — ABNORMAL HIGH

## 2011-02-20 LAB — PROTIME-INR
INR: 1
Prothrombin Time: 13.3

## 2011-02-20 LAB — D-DIMER, QUANTITATIVE: D-Dimer, Quant: 0.37

## 2011-02-20 LAB — APTT: aPTT: 23 — ABNORMAL LOW

## 2011-02-25 LAB — I-STAT 8, (EC8 V) (CONVERTED LAB)
Acid-Base Excess: 6 — ABNORMAL HIGH
BUN: 6
Bicarbonate: 32.6 — ABNORMAL HIGH
Chloride: 102
Glucose, Bld: 98
HCT: 42
Hemoglobin: 14.3
Operator id: 133231
Potassium: 4.1
Sodium: 143
TCO2: 34
pCO2, Ven: 53.7 — ABNORMAL HIGH
pH, Ven: 7.392 — ABNORMAL HIGH

## 2011-02-25 LAB — POCT PREGNANCY, URINE
Operator id: 114531
Preg Test, Ur: NEGATIVE

## 2011-02-26 ENCOUNTER — Ambulatory Visit (INDEPENDENT_AMBULATORY_CARE_PROVIDER_SITE_OTHER): Payer: Self-pay | Admitting: Surgery

## 2011-03-27 ENCOUNTER — Encounter (INDEPENDENT_AMBULATORY_CARE_PROVIDER_SITE_OTHER): Payer: Self-pay | Admitting: Surgery

## 2011-03-27 ENCOUNTER — Ambulatory Visit (INDEPENDENT_AMBULATORY_CARE_PROVIDER_SITE_OTHER): Payer: Medicaid Other | Admitting: Surgery

## 2011-03-27 VITALS — BP 138/88 | HR 68 | Temp 98.6°F | Resp 16 | Ht 63.0 in | Wt 214.6 lb

## 2011-03-27 DIAGNOSIS — Z85850 Personal history of malignant neoplasm of thyroid: Secondary | ICD-10-CM

## 2011-03-27 NOTE — Progress Notes (Signed)
Subjective:     Patient ID: Alexis [name], female   DOB: [birth year], 45 y.o.   MRN: [id number]  HPI The patient returns today for followup for her for thyroid cancer. She had a total thyroidectomy in June of 2010. She had a follicular variant of papillary thyroid cancer. She was treated with radioactive iodine. She has had residual uptake in the thyroid bed since. Her thyroglobulin levels have been undetectable. She is followed by an endocrinologist in [place]. She is late in follow up with him. She is still complaining of fatigue. Her incision still has some itching.   Review of Systems  Constitutional: Positive for fatigue and unexpected weight change.  HENT: Positive for neck pain.   Eyes: Negative.   Respiratory: Negative.   Cardiovascular: Negative.        Objective:   Physical Exam  Constitutional: She appears well-developed and well-nourished.  HENT:  Head: Normocephalic and atraumatic.  Eyes: EOM are normal. Pupils are equal, round, and reactive to light.  Neck: Normal range of motion. Neck supple. No tracheal deviation present.       Mild hypertrophic scar noted.  No mass.    Cardiovascular: Normal rate and regular rhythm.   Pulmonary/Chest: Effort normal and breath sounds normal.  Lymphadenopathy:    She has no cervical adenopathy.       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    Plan:     Needs to follow up with endocrine.I have asked her to call her and endocrinologist in [facility] for followup. I will see her back in one year. The area of increased uptake in her thyroid. Will need to be reevaluated at some point. Her endocrinologist and durum was following this. She will also need a thyroglobulin level checked. I will defer to the endocrinology group for this. She has missed appointments due to car trouble. I asked her to call them to reschedule

## 2011-03-27 NOTE — Patient Instructions (Signed)
Follow up with endocrine MD soon.  Follow up at [facility] in 1 year.

## 2011-06-22 ENCOUNTER — Encounter (HOSPITAL_BASED_OUTPATIENT_CLINIC_OR_DEPARTMENT_OTHER): Payer: Self-pay | Admitting: *Deleted

## 2011-06-22 NOTE — Progress Notes (Signed)
To come in for labs,ekg  

## 2011-06-23 ENCOUNTER — Encounter (HOSPITAL_BASED_OUTPATIENT_CLINIC_OR_DEPARTMENT_OTHER)
Admission: RE | Admit: 2011-06-23 | Discharge: 2011-06-23 | Disposition: A | Discharge status: Home / Self Care | Payer: Medicaid Other | Source: Ambulatory Visit | Attending: Specialist | Admitting: Specialist

## 2011-06-24 ENCOUNTER — Encounter (HOSPITAL_BASED_OUTPATIENT_CLINIC_OR_DEPARTMENT_OTHER)
Admission: RE | Admit: 2011-06-24 | Discharge: 2011-06-24 | Disposition: A | Discharge status: Home / Self Care | Payer: Medicaid Other | Source: Ambulatory Visit | Attending: Specialist | Admitting: Specialist

## 2011-06-24 ENCOUNTER — Other Ambulatory Visit: Payer: Self-pay

## 2011-06-24 LAB — BASIC METABOLIC PANEL
BUN: 9 mg/dL (ref 6–23)
CO2: 29 mEq/L (ref 19–32)
Calcium: 9.8 mg/dL (ref 8.4–10.5)
Chloride: 104 mEq/L (ref 96–112)
Creatinine, Ser: 0.71 mg/dL (ref 0.50–1.10)
GFR calc Af Amer: >90 mL/min (ref >90)
GFR calc non Af Amer: >90 mL/min (ref >90)
Glucose, Bld: 87 mg/dL (ref 70–99)
Potassium: 4.3 mEq/L (ref 3.5–5.1)
Sodium: 141 mEq/L (ref 135–145)

## 2011-06-29 ENCOUNTER — Encounter (HOSPITAL_BASED_OUTPATIENT_CLINIC_OR_DEPARTMENT_OTHER): Payer: Self-pay | Admitting: Anesthesiology

## 2011-06-29 ENCOUNTER — Other Ambulatory Visit: Payer: Self-pay | Admitting: Specialist

## 2011-06-29 ENCOUNTER — Encounter (HOSPITAL_BASED_OUTPATIENT_CLINIC_OR_DEPARTMENT_OTHER)
Admission: RE | Disposition: A | Discharge status: Home / Self Care | Payer: Self-pay | Source: Ambulatory Visit | Attending: Specialist

## 2011-06-29 ENCOUNTER — Encounter (HOSPITAL_BASED_OUTPATIENT_CLINIC_OR_DEPARTMENT_OTHER): Payer: Self-pay | Admitting: *Deleted

## 2011-06-29 ENCOUNTER — Ambulatory Visit (HOSPITAL_BASED_OUTPATIENT_CLINIC_OR_DEPARTMENT_OTHER)
Admission: RE | Admit: 2011-06-29 | Discharge: 2011-06-29 | Disposition: A | Discharge status: Home / Self Care | Payer: Medicaid Other | Source: Ambulatory Visit | Attending: Specialist | Admitting: Specialist

## 2011-06-29 ENCOUNTER — Encounter (HOSPITAL_BASED_OUTPATIENT_CLINIC_OR_DEPARTMENT_OTHER): Payer: Self-pay | Admitting: Certified Registered Nurse Anesthetist

## 2011-06-29 ENCOUNTER — Ambulatory Visit (HOSPITAL_BASED_OUTPATIENT_CLINIC_OR_DEPARTMENT_OTHER): Payer: Medicaid Other | Admitting: Anesthesiology

## 2011-06-29 DIAGNOSIS — Z85850 Personal history of malignant neoplasm of thyroid: Secondary | ICD-10-CM | POA: Insufficient documentation

## 2011-06-29 DIAGNOSIS — Z01810 Encounter for preprocedural cardiovascular examination: Secondary | ICD-10-CM | POA: Insufficient documentation

## 2011-06-29 DIAGNOSIS — L732 Hidradenitis suppurativa: Secondary | ICD-10-CM | POA: Insufficient documentation

## 2011-06-29 DIAGNOSIS — E039 Hypothyroidism, unspecified: Secondary | ICD-10-CM | POA: Insufficient documentation

## 2011-06-29 DIAGNOSIS — R0602 Shortness of breath: Secondary | ICD-10-CM | POA: Insufficient documentation

## 2011-06-29 DIAGNOSIS — I10 Essential (primary) hypertension: Secondary | ICD-10-CM | POA: Insufficient documentation

## 2011-06-29 DIAGNOSIS — K219 Gastro-esophageal reflux disease without esophagitis: Secondary | ICD-10-CM | POA: Insufficient documentation

## 2011-06-29 HISTORY — DX: Other complications of anesthesia, initial encounter: T88.59XA

## 2011-06-29 HISTORY — DX: Adverse effect of unspecified anesthetic, initial encounter: T41.45XA

## 2011-06-29 HISTORY — PX: HYDRADENITIS EXCISION: SHX5243

## 2011-06-29 LAB — POCT HEMOGLOBIN-HEMACUE: Hemoglobin: 10.1 g/dL — ABNORMAL LOW (ref 12.0–15.0)

## 2011-06-29 SURGERY — EXCISION, HIDRADENITIS, AXILLA
Anesthesia: General | Site: Breast | Wound class: Clean Contaminated

## 2011-06-29 MED ORDER — ONDANSETRON HCL 4 MG/2ML IJ SOLN
INTRAMUSCULAR | Status: DC | PRN
  Administered 2011-06-29: 4 mg via INTRAVENOUS

## 2011-06-29 MED ORDER — MORPHINE SULFATE 4 MG/ML IJ SOLN: 0.0500 mg/kg | INTRAMUSCULAR | Status: DC | PRN

## 2011-06-29 MED ORDER — MIDAZOLAM HCL 5 MG/5ML IJ SOLN
INTRAMUSCULAR | Status: DC | PRN
  Administered 2011-06-29: 2 mg via INTRAVENOUS

## 2011-06-29 MED ORDER — LIDOCAINE-EPINEPHRINE 0.5-1:200000 % IJ SOLN
INTRAMUSCULAR | Status: DC | PRN
  Administered 2011-06-29: 50 mL

## 2011-06-29 MED ORDER — PROPOFOL 10 MG/ML IV EMUL
INTRAVENOUS | Status: DC | PRN
  Administered 2011-06-29: 300 mg via INTRAVENOUS

## 2011-06-29 MED ORDER — LACTATED RINGERS IV SOLN
INTRAVENOUS | Status: DC
  Administered 2011-06-29 (×2): via INTRAVENOUS

## 2011-06-29 MED ORDER — MEPERIDINE HCL 50 MG PO TABS: 50.0000 mg | ORAL_TABLET | Freq: Once | ORAL | Status: DC

## 2011-06-29 MED ORDER — LIDOCAINE HCL (CARDIAC) 20 MG/ML IV SOLN
INTRAVENOUS | Status: DC | PRN
  Administered 2011-06-29: 50 mg via INTRAVENOUS

## 2011-06-29 MED ORDER — DROPERIDOL 2.5 MG/ML IJ SOLN
INTRAMUSCULAR | Status: DC | PRN
  Administered 2011-06-29: 0.625 mg via INTRAVENOUS

## 2011-06-29 MED ORDER — FENTANYL CITRATE 0.05 MG/ML IJ SOLN
INTRAMUSCULAR | Status: DC | PRN
  Administered 2011-06-29: 25 ug via INTRAVENOUS
  Administered 2011-06-29: 50 ug via INTRAVENOUS
  Administered 2011-06-29: 25 ug via INTRAVENOUS

## 2011-06-29 MED ORDER — METOCLOPRAMIDE HCL 5 MG/ML IJ SOLN: 10.0000 mg | Freq: Once | INTRAMUSCULAR | Status: DC | PRN

## 2011-06-29 MED ORDER — EPHEDRINE SULFATE 50 MG/ML IJ SOLN
INTRAMUSCULAR | Status: DC | PRN
  Administered 2011-06-29: 10 mg via INTRAVENOUS

## 2011-06-29 MED ORDER — CEFAZOLIN SODIUM 1-5 GM-% IV SOLN
INTRAVENOUS | Status: DC | PRN
  Administered 2011-06-29: 2 g via INTRAVENOUS

## 2011-06-29 MED ORDER — ACETAMINOPHEN 10 MG/ML IV SOLN
1000.0000 mg | Freq: Once | INTRAVENOUS | Status: AC
  Administered 2011-06-29: 1000 mg via INTRAVENOUS

## 2011-06-29 MED ORDER — DEXAMETHASONE SODIUM PHOSPHATE 4 MG/ML IJ SOLN
INTRAMUSCULAR | Status: DC | PRN
  Administered 2011-06-29: 10 mg via INTRAVENOUS

## 2011-06-29 MED ORDER — SODIUM CHLORIDE 0.9 % IV SOLN
INTRAVENOUS | Status: DC | PRN
  Administered 2011-06-29: 50 mL via INTRAMUSCULAR

## 2011-06-29 MED ORDER — FENTANYL CITRATE 0.05 MG/ML IJ SOLN
25.0000 ug | INTRAMUSCULAR | Status: DC | PRN
  Administered 2011-06-29: 25 ug via INTRAVENOUS

## 2011-06-29 SURGICAL SUPPLY — 63 items
APL SKNCLS STERI-STRIP NONHPOA (GAUZE/BANDAGES/DRESSINGS) ×2
BAG DECANTER FOR FLEXI CONT (MISCELLANEOUS) IMPLANT
BENZOIN TINCTURE PRP APPL 2/3 (GAUZE/BANDAGES/DRESSINGS) ×2 IMPLANT
BLADE HEX COATED 2.75 (ELECTRODE) ×1 IMPLANT
BLADE KNIFE PERSONA 10 (BLADE) ×3 IMPLANT
BLADE KNIFE PERSONA 15 (BLADE) ×3 IMPLANT
BRIEF STRETCH FOR OB PAD LRG (UNDERPADS AND DIAPERS) ×1 IMPLANT
CANISTER SUCTION 1200CC (MISCELLANEOUS) ×3 IMPLANT
CLOTH BEACON ORANGE TIMEOUT ST (SAFETY) ×3 IMPLANT
COVER MAYO STAND STRL (DRAPES) ×2 IMPLANT
DECANTER SPIKE VIAL GLASS SM (MISCELLANEOUS) IMPLANT
DRAPE LAPAROSCOPIC ABDOMINAL (DRAPES) ×2 IMPLANT
DRESSING TELFA 8X3 (GAUZE/BANDAGES/DRESSINGS) IMPLANT
DRSG PAD ABDOMINAL 8X10 ST (GAUZE/BANDAGES/DRESSINGS) ×3 IMPLANT
ELECT REM PT RETURN 9FT ADLT (ELECTROSURGICAL) ×3
ELECTRODE REM PT RTRN 9FT ADLT (ELECTROSURGICAL) ×2 IMPLANT
FILTER 7/8 IN (FILTER) ×3 IMPLANT
GAUZE XEROFORM 5X9 LF (GAUZE/BANDAGES/DRESSINGS) ×3 IMPLANT
GLOVE ECLIPSE 7.0 STRL STRAW (GLOVE) ×3 IMPLANT
GLOVE SKINSENSE NS SZ6.5 (GLOVE) ×1
GLOVE SKINSENSE NS SZ7.0 (GLOVE) ×1
GLOVE SKINSENSE NS SZ7.5 (GLOVE) ×1
GLOVE SKINSENSE STRL SZ6.5 (GLOVE) ×1 IMPLANT
GLOVE SKINSENSE STRL SZ7.0 (GLOVE) ×1 IMPLANT
GLOVE SKINSENSE STRL SZ7.5 (GLOVE) ×1 IMPLANT
GOWN PREVENTION PLUS XLARGE (GOWN DISPOSABLE) IMPLANT
GOWN PREVENTION PLUS XXLARGE (GOWN DISPOSABLE) ×5 IMPLANT
IV NS 500ML (IV SOLUTION) ×3
IV NS 500ML BAXH (IV SOLUTION) ×3 IMPLANT
NDL FILTER BLUNT 18X1 1/2 (NEEDLE) IMPLANT
NDL HYPO 25X1 1.5 SAFETY (NEEDLE) IMPLANT
NDL SAFETY ECLIPSE 18X1.5 (NEEDLE) IMPLANT
NDL SPNL 18GX3.5 QUINCKE PK (NEEDLE) IMPLANT
NEEDLE FILTER BLUNT 18X 1/2SAF (NEEDLE)
NEEDLE FILTER BLUNT 18X1 1/2 (NEEDLE) IMPLANT
NEEDLE HYPO 18GX1.5 SHARP (NEEDLE)
NEEDLE HYPO 25X1 1.5 SAFETY (NEEDLE) IMPLANT
NEEDLE SPNL 18GX3.5 QUINCKE PK (NEEDLE) ×3 IMPLANT
NS IRRIG 1000ML POUR BTL (IV SOLUTION) ×3 IMPLANT
PACK BASIN DAY SURGERY FS (CUSTOM PROCEDURE TRAY) ×3 IMPLANT
PACK LITHOTOMY IV (CUSTOM PROCEDURE TRAY) ×3 IMPLANT
PEN SKIN MARKING BROAD TIP (MISCELLANEOUS) ×3 IMPLANT
PENCIL BUTTON HOLSTER BLD 10FT (ELECTRODE) IMPLANT
SHEET MEDIUM DRAPE 40X70 STRL (DRAPES) IMPLANT
SPONGE GAUZE 4X4 12PLY (GAUZE/BANDAGES/DRESSINGS) ×3 IMPLANT
SPONGE LAP 18X18 X RAY DECT (DISPOSABLE) ×6 IMPLANT
STRIP SUTURE WOUND CLOSURE 1/2 (SUTURE) ×2 IMPLANT
SUT ETHILON 3 0 FSL (SUTURE) IMPLANT
SUT MNCRL AB 3-0 PS2 18 (SUTURE) IMPLANT
SUT MON AB 2-0 CT1 36 (SUTURE) IMPLANT
SUT PROLENE 4 0 P 3 18 (SUTURE) IMPLANT
SUT VIC AB 2-0 CT1 27 (SUTURE)
SUT VIC AB 2-0 CT1 TAPERPNT 27 (SUTURE) IMPLANT
SUT VIC AB 3-0 FS2 27 (SUTURE) IMPLANT
SYR 20CC LL (SYRINGE) IMPLANT
SYR 50ML LL SCALE MARK (SYRINGE) ×3 IMPLANT
TOWEL OR 17X24 6PK STRL BLUE (TOWEL DISPOSABLE) ×9 IMPLANT
TRAY DSU PREP LF (CUSTOM PROCEDURE TRAY) ×3 IMPLANT
TUBE CONNECTING 20X1/4 (TUBING) ×3 IMPLANT
UNDERPAD 30X30 INCONTINENT (UNDERPADS AND DIAPERS) ×5 IMPLANT
VAC PENCILS W/TUBING CLEAR (MISCELLANEOUS) ×3 IMPLANT
WATER STERILE IRR 1000ML POUR (IV SOLUTION) ×3 IMPLANT
YANKAUER SUCT BULB TIP NO VENT (SUCTIONS) ×3 IMPLANT

## 2011-06-29 NOTE — Anesthesia Procedure Notes (Signed)
Procedure Name: LMA Insertion Performed by: Sharyne Richters Pre-anesthesia Checklist: Patient identified, Timeout performed, Emergency Drugs available, Suction available and Patient being monitored Patient Re-evaluated:Patient Re-evaluated prior to inductionOxygen Delivery Method: Circle System Utilized Preoxygenation: Pre-oxygenation with 100% oxygen Intubation Type: IV induction Ventilation: Mask ventilation without difficulty LMA: LMA with gastric port inserted LMA Size: 4.0 Number of attempts: 1 Placement Confirmation: breath sounds checked- equal and bilateral and positive ETCO2 Tube secured with: Tape

## 2011-06-29 NOTE — Discharge Instructions (Addendum)
Do not do heavy lifting or driving   Keep dressings dry  Call your surgeon if you experience:   1.  Fever over 101.0. 2.  Inability to urinate. 3.  Nausea and/or vomiting. 4.  Extreme swelling or bruising at the surgical site. 5.  Continued bleeding from the incision. 6.  Increased pain, redness or drainage from the incision. 7.  Problems related to your pain medication.    [facility]  [address] [place], [place] [address] [phone]   Post Anesthesia Home Care Instructions  Activity: Get plenty of rest for the remainder of the day. A responsible adult should stay with you for 24 hours following the procedure.  For the next 24 hours, DO NOT: -Drive a car -Advertising copywriter -Drink alcoholic beverages -Take any medication unless instructed by your physician -Make any legal decisions or sign important papers.  Meals: Start with liquid foods such as gelatin or soup. Progress to regular foods as tolerated. Avoid greasy, spicy, heavy foods. If nausea and/or vomiting occur, drink only clear liquids until the nausea and/or vomiting subsides. Call your physician if vomiting continues.  Special Instructions/Symptoms: Your throat may feel dry or sore from the anesthesia or the breathing tube placed in your throat during surgery. If this causes discomfort, gargle with warm salt water. The discomfort should disappear within 24 hours.

## 2011-06-29 NOTE — Brief Op Note (Signed)
06/29/2011  11:40 AM  PATIENT:  Alexis [name]  47 y.o. female  PRE-OPERATIVE DIAGNOSIS:  hydredenitis right breast  POST-OPERATIVE DIAGNOSIS:  hydredenitis right breast  PROCEDURE:  Procedure(s): EXCISION HYDRADENITIS AXILLA  SURGEON:  Surgeon(s): Rosalio Macadamia, MD  :   }   ANESTHESIA:   general  EBL:  Total I/O In: 1000 [I.V.:1000] Out: -   BLOOD ADMINISTERED:none  DRAINS: none   LOCAL MEDICATIONS USED:  XYLOCAINE   SPECIMEN:  Excision  DISPOSITION OF SPECIMEN:  PATHOLOGY  COUNTS:  YES  TOURNIQUET:  * No tourniquets in log *  DICTATION: .Other Dictation: Dictation Number [id number]  PLAN OF CARE: Discharge to home after PACU  PATIENT DISPOSITION:  PACU - hemodynamically stable.   Delay start of Pharmacological VTE agent (>24hrs) due to surgical blood loss or risk of bleeding: not applicable

## 2011-06-29 NOTE — Anesthesia Postprocedure Evaluation (Signed)
Anesthesia Post Note  Patient: Alexis [name]  Procedure(s) Performed:  EXCISION HYDRADENITIS AXILLA - right breast hydradenitis excioion  Anesthesia type: General  Patient location: PACU  Post pain: Pain level controlled  Post assessment: Patient's Cardiovascular Status Stable  Last Vitals:  Filed Vitals:   06/29/11 1300  BP: 135/87  Pulse:   Temp: 36.7 C  Resp: 16    Post vital signs: Reviewed and stable  Level of consciousness: alert  Complications: No apparent anesthesia complications

## 2011-06-29 NOTE — Anesthesia Preprocedure Evaluation (Signed)
Anesthesia Evaluation  Patient identified by MRN, date of birth, ID band Patient awake    Reviewed: Allergy & Precautions, H&P , NPO status , Patient's Chart, lab work & pertinent test results, reviewed documented beta blocker date and time   History of Anesthesia Complications (+) PONV  Airway Mallampati: II TM Distance: >3 FB Neck ROM: full    Dental   Pulmonary shortness of breath and with exertion, asthma ,          Cardiovascular hypertension, On Medications     Neuro/Psych  Neuromuscular disease Negative Neurological ROS  Negative Psych ROS   GI/Hepatic Neg liver ROS, GERD-  Medicated and Controlled,  Endo/Other  Negative Endocrine ROSMorbid obesity  Renal/GU negative Renal ROS  Genitourinary negative   Musculoskeletal   Abdominal   Peds  Hematology negative hematology ROS (+)   Anesthesia Other Findings See surgeon's H&P   Reproductive/Obstetrics negative OB ROS                           Anesthesia Physical Anesthesia Plan  ASA: III  Anesthesia Plan: General   Post-op Pain Management:    Induction: Intravenous  Airway Management Planned: LMA  Additional Equipment:   Intra-op Plan:   Post-operative Plan: Extubation in OR  Informed Consent: I have reviewed the patients History and Physical, chart, labs and discussed the procedure including the risks, benefits and alternatives for the proposed anesthesia with the patient or authorized representative who has indicated his/her understanding and acceptance.     Plan Discussed with: CRNA and Surgeon  Anesthesia Plan Comments:         Anesthesia Quick Evaluation

## 2011-06-29 NOTE — Transfer of Care (Signed)
Immediate Anesthesia Transfer of Care Note  Patient: Alexis [name]  Procedure(s) Performed:  EXCISION HYDRADENITIS AXILLA - right breast hydradenitis excioion  Patient Location: PACU  Anesthesia Type: General  Level of Consciousness: awake, alert , oriented and patient cooperative  Airway & Oxygen Therapy: Patient Spontanous Breathing and Patient connected to face mask oxygen  Post-op Assessment: Report given to PACU RN and Post -op Vital signs reviewed and stable  Post vital signs: Reviewed and stable  Complications: No apparent anesthesia complications

## 2011-06-29 NOTE — H&P (Signed)
Alexis [name] is an 46 y.o. female.   Chief Complaint: hidradenitis of right breast HPI: injcreased pain and drainage and recurrent abcesses  Past Medical History  Diagnosis Date  . Hypertension   . GERD (gastroesophageal reflux disease)   . Sickle cell anemia     trait  . Neuromuscular disorder     fibromyalgia  . Thyroid disease   . Arthritis   . Asthma   . Dyspnea   . Degenerative disk disease   . Baker's cyst     left knee  . Hypothyroidism   . Cancer     thyroid  . Complication of anesthesia     has awakened in past    Past Surgical History  Procedure Date  . Thyroidectomy   . Breast enhancement surgery     bilateral  . Achilles tendon repair 2007    left  . Vein surgery   . Bunionectomy 2011    left  . Temporal artery biopsy / ligation 2010  . Biopsy thyroid 2009  . Laparoscopy   . Tonsillectomy     Family History  Problem Relation Age of Onset  . Cancer Paternal Aunt     breast, colon   Social History:  reports that she has never smoked. She does not have any smokeless tobacco history on file. She reports that she drinks alcohol. She reports that she does not use illicit drugs.  Allergies:  Allergies  Allergen Reactions  . Bactrim   . Ibuprofen     Gi upset  . Latex   . Nsaids     Gi upset    Medications Prior to Admission  Medication Dose Route Frequency Provider Last Rate Last Dose  . acetaminophen (OFIRMEV) IV 1,000 mg  1,000 mg Intravenous Once Constance Goltz, MD   1,000 mg at 06/29/11 0954  . lactated ringers infusion   Intravenous Continuous Constance Goltz, MD 20 mL/hr at 06/29/11 1610     Medications Prior to Admission  Medication Sig Dispense Refill  . beclomethasone (QVAR) 80 MCG/ACT inhaler Inhale 1 puff into the lungs as needed.       . cetirizine (ZYRTEC) 10 MG tablet Take 10 mg by mouth daily.      . DULoxetine (CYMBALTA) 30 MG capsule Take 30 mg by mouth every evening.      . ferrous fumarate (HEMOCYTE -  106 MG FE) 325 (106 FE) MG TABS Take 1 tablet by mouth daily.      Marland Kitchen HYDROcodone-acetaminophen (NORCO) 10-325 MG per tablet Take 1 tablet by mouth every 6 (six) hours as needed.        Marland Kitchen levothyroxine (SYNTHROID, LEVOTHROID) 112 MCG tablet Take 112 mcg by mouth daily.        . methocarbamol (ROBAXIN) 750 MG tablet Take 750 mg by mouth 3 (three) times daily.        Marland Kitchen olmesartan-hydrochlorothiazide (BENICAR HCT) 20-12.5 MG per tablet Take 1 tablet by mouth daily. Pt only taking half of pill       . omeprazole-sodium bicarbonate (ZEGERID) 40-1100 MG per capsule Take 1 capsule by mouth daily before breakfast.        . oxymorphone (OPANA ER, CRUSH RESISTANT,) 10 MG TB12 12 hr tablet Take 10 mg by mouth every 12 (twelve) hours.        . Vitamin D, Ergocalciferol, (DRISDOL) 50000 UNITS CAPS Take 50,000 Units by mouth.        Results for orders placed during the hospital  encounter of 06/29/11 (from the past 48 hour(s))  POCT HEMOGLOBIN-HEMACUE     Status: Abnormal   Collection Time   06/29/11 10:00 AM      Component Value Range Comment   Hemoglobin 10.1 (*) 12.0 - 15.0 (g/dL)    No results found.  Review of Systems  Constitutional: Negative.   HENT: Negative.   Eyes: Negative.   Respiratory: Negative.   Cardiovascular: Negative.   Gastrointestinal: Positive for heartburn.  Genitourinary: Negative.   Musculoskeletal: Negative.   Skin: Positive for rash.  Neurological: Negative.   Psychiatric/Behavioral: Negative.  Negative for depression.    Blood pressure 122/84, pulse 94, temperature 98.8 F (37.1 C), temperature source Oral, resp. rate 20, height 5\' 3"  (1.6 m), weight 90.719 kg (200 lb), last menstrual period 06/20/2011, SpO2 97.00%. Physical Exam   Assessment/Plan Excision with plastic closure  Alexis [name] L 06/29/2011, 10:37 AM

## 2011-06-30 ENCOUNTER — Encounter (HOSPITAL_BASED_OUTPATIENT_CLINIC_OR_DEPARTMENT_OTHER): Payer: Self-pay | Admitting: Specialist

## 2011-06-30 NOTE — Op Note (Signed)
NAME:  Alexis [name], Alexis [name]                   ACCOUNT NO.:  MEDICAL RECORD NO.:  [id number]  LOCATION:                                 FACILITY:  PHYSICIAN:  Earvin Hansen L. Shon Hough, M.D.   DATE OF BIRTH:  DATE OF PROCEDURE:  06/29/2011 DATE OF DISCHARGE:                              OPERATIVE REPORT   SURGEON:  Yaakov Guthrie. Shon Hough, MD  HISTORY:  A 46 year old lady with severe history of hidradenitis suppurativa involving her right and left breast areas, right side greater than left.  It was predominantly located in the central portion of the inframammary fold region over the upper abdomen.  PROCEDURES:  Wide excision of hidradenitis of the right inframammary abdominal areas, closure with advancement flap complex.  ANESTHESIA:  General.  DESCRIPTION OF PROCEDURE:  The patient underwent general anesthesia and intubated orally.  Prep was done to the chest and breast areas in routine fashion using Hibiclens soap and solution, walled off with sterile towels and drapes so as to make a sterile field.  We re-outlined the area of excision.  Xylocaine 0.25% with epinephrine was injected locally, 1:400,000 concentration, a total of 100 mL.  This was allowed to set up for hemostasis and vasoconstriction.  Excision was made with #15 blade in the drawn areas and dissection was carried down to subcutaneous tissue removing a large flap of diseased tissue. Hemostasis was maintained with the Bovie anticoagulation.  Next, the superior and inferior flaps were freed up approximately 8 cm of the size to allow an advancement flap closure, deep sutures of 2-0 Monocryl x2 layers and subdermal suture of 5-0 Monocryl, then a running subcuticular stitch of 3-0 Monocryl.  Steri-Strips and soft dressings were applied to all the areas.  The wounds were covered with Xeroform, 4x4s, ABDs, and Hypafix tape.  ESTIMATED BLOOD LOSS:  Less than 100 mL.  COMPLICATIONS:  None.     Yaakov Guthrie. Shon Hough,  M.D.     Cathie Hoops  D:  06/29/2011  T:  06/30/2011  Job:  [id number]

## 2011-08-07 ENCOUNTER — Other Ambulatory Visit (HOSPITAL_COMMUNITY): Payer: Self-pay | Admitting: Otolaryngology

## 2011-08-12 ENCOUNTER — Ambulatory Visit (HOSPITAL_COMMUNITY)
Admission: RE | Admit: 2011-08-12 | Discharge: 2011-08-12 | Disposition: A | Discharge status: Home / Self Care | Payer: Medicaid Other | Source: Ambulatory Visit | Attending: Otolaryngology | Admitting: Otolaryngology

## 2011-08-12 DIAGNOSIS — R1310 Dysphagia, unspecified: Secondary | ICD-10-CM | POA: Insufficient documentation

## 2011-08-12 LAB — CREATININE, SERUM
Creatinine, Ser: 0.71 mg/dL (ref 0.50–1.10)
GFR calc Af Amer: >90 mL/min (ref >90)
GFR calc non Af Amer: >90 mL/min (ref >90)

## 2011-08-12 LAB — BUN: BUN: 9 mg/dL (ref 6–23)

## 2011-08-12 MED ORDER — IOHEXOL 300 MG/ML  SOLN
75.0000 mL | Freq: Once | INTRAMUSCULAR | Status: AC | PRN
  Administered 2011-08-12: 75 mL via INTRAVENOUS

## 2011-08-12 NOTE — Procedures (Signed)
Outpatient Modified Barium Swallowing Study  Patient Details  Name: Alexis [name] MRN: [id number] Date of Birth: [birth year]  Today's Date: 08/12/2011 Time:  -     Past Medical History:  Past Medical History  Diagnosis Date  . Hypertension   . GERD (gastroesophageal reflux disease)   . Sickle cell anemia     trait  . Neuromuscular disorder     fibromyalgia  . Thyroid disease   . Arthritis   . Asthma   . Dyspnea   . Degenerative disk disease   . Baker's cyst     left knee  . Hypothyroidism   . Cancer     thyroid  . Complication of anesthesia     has awakened in past   Past Surgical History:  Past Surgical History  Procedure Date  . Thyroidectomy   . Breast enhancement surgery     bilateral  . Achilles tendon repair 2007    left  . Vein surgery   . Bunionectomy 2011    left  . Temporal artery biopsy / ligation 2010  . Biopsy thyroid 2009  . Laparoscopy   . Tonsillectomy   . Hydradenitis excision 06/29/2011    Procedure: EXCISION HYDRADENITIS AXILLA;  Surgeon: Rosalio Macadamia, MD;  Location: [facility];  Service: Plastics;;  right breast hydradenitis excioion   HPI:  46 y.o. female referred for a MBSS today after c/o dysphagia with pills and solids as well as signficant, thick seceretions in her throat ever since a thyroidectomy in 2010.  Patient recently had a left vocal cord paralysis diagnosed that she was unaware of, although she does report intermittent hoarseness with excessive talking.  Other c/o include decreased taste, base of tongue sensation and easy fatigue with talking/mastication.  Patient is taking Zegrid for acid reflux and c/o coughing at night and poor management of acid reflux.  A small hiatal hernia was identified in that past as well.    Recommendation/Prognosis  Clinical Impression Dysphagia Diagnosis: Within Functional Limits Clinical impression: Demonstrates a functional oral-pharyngeal swallow without any delay, premature  spillage, laryngeal penetration nor aspiration.  Patient's globus sensation complaint may be due to a slightly prominent UES that has emerged due to breakthrough reflux.  Validated patient's report that the tongue isn't pumping that way it should, however no overt base of tongue weakness was evident.  Given vocal cord paralysis and other head/neck sensory changes, patient may have a descreete motor change in tongue function that is not measurable in this study.     Swallow Evaluation Recommendations Diet Recommendations: Regular;Thin liquid Liquid Administration via: Cup;Straw Medication Administration: Whole meds with liquid Follow up Recommendations: None   Individuals Consulted Consulted and Agree with Results and Recommendations: Patient  Myra Rude, M.S.,CCC-SLP Pager [phone] 08/12/2011, 1:14 PM

## 2011-08-13 ENCOUNTER — Other Ambulatory Visit: Payer: Self-pay

## 2011-08-17 ENCOUNTER — Other Ambulatory Visit (INDEPENDENT_AMBULATORY_CARE_PROVIDER_SITE_OTHER): Payer: Medicaid Other

## 2011-08-17 DIAGNOSIS — Z3040 Encounter for surveillance of contraceptives, unspecified: Secondary | ICD-10-CM

## 2011-10-14 ENCOUNTER — Other Ambulatory Visit (HOSPITAL_COMMUNITY): Payer: Self-pay | Admitting: Endocrinology

## 2011-10-14 DIAGNOSIS — C73 Malignant neoplasm of thyroid gland: Secondary | ICD-10-CM

## 2011-11-02 ENCOUNTER — Inpatient Hospital Stay (HOSPITAL_COMMUNITY): Admission: RE | Admit: 2011-11-02 | Payer: Medicaid Other | Source: Ambulatory Visit

## 2011-11-03 ENCOUNTER — Ambulatory Visit (HOSPITAL_COMMUNITY): Payer: Medicaid Other

## 2011-11-04 ENCOUNTER — Ambulatory Visit (HOSPITAL_COMMUNITY): Payer: Medicaid Other

## 2011-11-04 DIAGNOSIS — R102 Pelvic and perineal pain: Secondary | ICD-10-CM

## 2011-11-04 DIAGNOSIS — G8929 Other chronic pain: Secondary | ICD-10-CM

## 2011-11-06 ENCOUNTER — Other Ambulatory Visit: Payer: Medicaid Other

## 2011-11-06 ENCOUNTER — Encounter (HOSPITAL_COMMUNITY): Payer: Medicaid Other

## 2011-11-16 ENCOUNTER — Emergency Department (HOSPITAL_COMMUNITY)
Admission: EM | Admit: 2011-11-16 | Discharge: 2011-11-16 | Disposition: A | Discharge status: Home / Self Care | Payer: Medicaid Other | Attending: Emergency Medicine | Admitting: Emergency Medicine

## 2011-11-16 ENCOUNTER — Encounter (HOSPITAL_COMMUNITY)
Admission: RE | Admit: 2011-11-16 | Discharge: 2011-11-16 | Disposition: A | Discharge status: Home / Self Care | Payer: Medicaid Other | Source: Ambulatory Visit | Attending: Endocrinology | Admitting: Endocrinology

## 2011-11-16 ENCOUNTER — Encounter (HOSPITAL_COMMUNITY): Payer: Self-pay | Admitting: Emergency Medicine

## 2011-11-16 DIAGNOSIS — Z79899 Other long term (current) drug therapy: Secondary | ICD-10-CM | POA: Insufficient documentation

## 2011-11-16 DIAGNOSIS — IMO0001 Reserved for inherently not codable concepts without codable children: Secondary | ICD-10-CM | POA: Insufficient documentation

## 2011-11-16 DIAGNOSIS — M797 Fibromyalgia: Secondary | ICD-10-CM

## 2011-11-16 DIAGNOSIS — C73 Malignant neoplasm of thyroid gland: Secondary | ICD-10-CM | POA: Insufficient documentation

## 2011-11-16 DIAGNOSIS — M25511 Pain in right shoulder: Secondary | ICD-10-CM

## 2011-11-16 DIAGNOSIS — E039 Hypothyroidism, unspecified: Secondary | ICD-10-CM | POA: Insufficient documentation

## 2011-11-16 DIAGNOSIS — M25519 Pain in unspecified shoulder: Secondary | ICD-10-CM | POA: Insufficient documentation

## 2011-11-16 DIAGNOSIS — I10 Essential (primary) hypertension: Secondary | ICD-10-CM | POA: Insufficient documentation

## 2011-11-16 DIAGNOSIS — J45909 Unspecified asthma, uncomplicated: Secondary | ICD-10-CM | POA: Insufficient documentation

## 2011-11-16 DIAGNOSIS — Y9289 Other specified places as the place of occurrence of the external cause: Secondary | ICD-10-CM | POA: Insufficient documentation

## 2011-11-16 DIAGNOSIS — K219 Gastro-esophageal reflux disease without esophagitis: Secondary | ICD-10-CM | POA: Insufficient documentation

## 2011-11-16 MED ORDER — THYROTROPIN ALFA 1.1 MG IM SOLR
0.9000 mg | INTRAMUSCULAR | Status: AC
  Administered 2011-11-16: 0.9 mg via INTRAMUSCULAR
  Filled 2011-11-16: qty 0.9

## 2011-11-16 MED ORDER — HYDROMORPHONE HCL PF 1 MG/ML IJ SOLN
1.0000 mg | Freq: Once | INTRAMUSCULAR | Status: AC
  Administered 2011-11-16: 1 mg via INTRAMUSCULAR
  Filled 2011-11-16: qty 1
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
## 2011-11-16 NOTE — ED Notes (Signed)
Tawanna Cooler PAC at bedside.
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
## 2011-11-17 ENCOUNTER — Ambulatory Visit (HOSPITAL_COMMUNITY)
Admission: RE | Admit: 2011-11-17 | Discharge: 2011-11-17 | Disposition: A | Discharge status: Home / Self Care | Payer: Medicaid Other | Source: Ambulatory Visit | Attending: Endocrinology | Admitting: Endocrinology

## 2011-11-17 DIAGNOSIS — E0789 Other specified disorders of thyroid: Secondary | ICD-10-CM | POA: Insufficient documentation

## 2011-11-17 DIAGNOSIS — C73 Malignant neoplasm of thyroid gland: Secondary | ICD-10-CM | POA: Insufficient documentation

## 2011-11-17 MED ORDER — THYROTROPIN ALFA 1.1 MG IM SOLR
0.9000 mg | INTRAMUSCULAR | Status: AC
  Administered 2011-11-17: 0.9 mg via INTRAMUSCULAR
  Filled 2011-11-17: qty 0.9

## 2011-11-17 NOTE — ED Provider Notes (Signed)
Medical screening examination/treatment/procedure(s) were performed by non-physician practitioner and as supervising physician I was immediately available for consultation/collaboration.   Myreon Wimer, MD 11/17/11 0706 

## 2011-11-18 ENCOUNTER — Ambulatory Visit (HOSPITAL_COMMUNITY)
Admission: RE | Admit: 2011-11-18 | Discharge: 2011-11-18 | Disposition: A | Discharge status: Home / Self Care | Payer: Medicaid Other | Source: Ambulatory Visit | Attending: Endocrinology | Admitting: Endocrinology

## 2011-11-18 DIAGNOSIS — C73 Malignant neoplasm of thyroid gland: Secondary | ICD-10-CM | POA: Insufficient documentation

## 2011-11-18 LAB — HCG, SERUM, QUALITATIVE: Preg, Serum: NEGATIVE

## 2011-11-20 ENCOUNTER — Ambulatory Visit (HOSPITAL_COMMUNITY)
Admission: RE | Admit: 2011-11-20 | Discharge: 2011-11-20 | Disposition: A | Discharge status: Home / Self Care | Payer: Medicaid Other | Source: Ambulatory Visit | Attending: Endocrinology | Admitting: Endocrinology

## 2011-11-20 MED ORDER — SODIUM IODIDE I 131 CAPSULE
4.0000 | Freq: Once | INTRAVENOUS | Status: AC | PRN
  Administered 2011-11-20: 4 via ORAL

## 2011-11-23 LAB — THYROGLOBULIN ANTIBODY: Thyroglobulin Ab: <20 U/mL (ref <40.0)

## 2011-11-23 LAB — THYROGLOBULIN LEVEL: Thyroglobulin: <0.2 ng/mL (ref 0.0–55.0)

## 2011-11-24 ENCOUNTER — Other Ambulatory Visit (HOSPITAL_COMMUNITY): Payer: Self-pay | Admitting: Endocrinology

## 2011-11-24 DIAGNOSIS — C73 Malignant neoplasm of thyroid gland: Secondary | ICD-10-CM

## 2011-12-03 ENCOUNTER — Other Ambulatory Visit (HOSPITAL_COMMUNITY): Payer: No Typology Code available for payment source

## 2011-12-09 ENCOUNTER — Ambulatory Visit (HOSPITAL_COMMUNITY)
Admission: RE | Admit: 2011-12-09 | Discharge: 2011-12-09 | Disposition: A | Discharge status: Home / Self Care | Payer: Medicaid Other | Source: Ambulatory Visit | Attending: Endocrinology | Admitting: Endocrinology

## 2011-12-09 ENCOUNTER — Other Ambulatory Visit (HOSPITAL_COMMUNITY): Payer: No Typology Code available for payment source

## 2011-12-09 DIAGNOSIS — R948 Abnormal results of function studies of other organs and systems: Secondary | ICD-10-CM | POA: Insufficient documentation

## 2011-12-09 DIAGNOSIS — C73 Malignant neoplasm of thyroid gland: Secondary | ICD-10-CM | POA: Insufficient documentation

## 2011-12-09 MED ORDER — IOHEXOL 300 MG/ML  SOLN
80.0000 mL | Freq: Once | INTRAMUSCULAR | Status: AC | PRN
  Administered 2011-12-09: 80 mL via INTRAVENOUS

## 2012-02-19 ENCOUNTER — Encounter: Payer: Self-pay | Admitting: Internal Medicine

## 2012-02-22 ENCOUNTER — Ambulatory Visit (INDEPENDENT_AMBULATORY_CARE_PROVIDER_SITE_OTHER): Payer: Medicaid Other | Admitting: Internal Medicine

## 2012-02-22 ENCOUNTER — Encounter: Payer: Self-pay | Admitting: Internal Medicine

## 2012-02-22 VITALS — BP 130/94 | HR 113 | Temp 98.7°F | Ht 63.0 in | Wt 222.4 lb

## 2012-02-22 DIAGNOSIS — R0602 Shortness of breath: Secondary | ICD-10-CM

## 2012-02-22 NOTE — Patient Instructions (Addendum)
Weight control is simply a matter of calorie balance which needs to be tilted in your favor by eating less and exercising more.  To get the most out of exercise, you need to be continuously aware that you are short of breath, but never out of breath, for 30 minutes daily. As you improve, it will actually be easier for you to do the same amount of exercise  in  30 minutes so always push to the level where you are short of breath.  If this does not result in gradual weight reduction then I strongly recommend you see a nutritionist with a food diary x 2 weeks so that we can work out a negative calorie balance which is universally effective in steady weight loss programs.  Think of your calorie balance like you do your bank account where in this case you want the balance to go down so you must take in less calories than you burn up.  It's just that simple:  Hard to do, but easy to understand.  Good luck!   Please schedule a follow up office visit in 6 weeks, call sooner if needed with pfts on return

## 2012-02-22 NOTE — Progress Notes (Signed)
  Subjective:    Patient ID: Alexis [name], female    DOB: 10-04-1965  MRN: [id number]  HPI  46 yobf never smoked with allergies as child from age 46 through first year of college rx by [facility] allergist and did ok with it until her 10's with increase increase seasonal allergy and need for zyrtec then onset in 2011 sob > referred 02/22/2012 to pulmonary clinic by Dr Tyson Dense.   02/22/2012 1st pulmonary eval cc indolent onset x 2 years progressive doe/fatigue to point where hard to get to mailbox,  Daily symptoms, assoc with sorethroat and trouble swallowing dx with VC paralysis 2010. rx zergerid for obvious hb finished about 6 weeks prior to OV   s worsening p stopped it - was taking once daily am.  Sob at rest - can't tolerate nexium.  Assoc with dry cough day > night.  No obvious daytime variabilty or assoc   cp or chest tightness, subjective wheeze overt sinus or hb symptoms. No unusual exp hx or h/o childhood pna/ asthma or premature birth to her knowledge.    Sleeping ok without nocturnal  or early am exacerbation  of respiratory  c/o's or need for noct saba. Also denies any obvious fluctuation of symptoms with weather or environmental changes or other aggravating or alleviating factors except as outlined above   Review of Systems  Constitutional: Negative for fever, chills and unexpected weight change.  HENT: Positive for sore throat and trouble swallowing. Negative for ear pain, nosebleeds, congestion, rhinorrhea, sneezing, dental problem, voice change, postnasal drip and sinus pressure.   Eyes: Negative for visual disturbance.  Respiratory: Positive for cough and shortness of breath. Negative for choking.   Cardiovascular: Positive for chest pain. Negative for leg swelling.  Gastrointestinal: Positive for abdominal pain. Negative for vomiting and diarrhea.  Genitourinary: Negative for difficulty urinating.  Musculoskeletal: Positive for arthralgias.  Skin: Negative for rash.    Neurological: Positive for headaches. Negative for tremors and syncope.  Hematological: Does not bruise/bleed easily.       Objective:   Physical Exam  amb bf nad . Wt Readings from Last 3 Encounters:  02/22/12 222 lb 6.4 oz (100.88 kg)  06/22/11 200 lb (90.719 kg)  06/22/11 200 lb (90.719 kg)     HEENT: nl dentition, turbinates, and orophanx. Nl external ear canals without cough reflex   NECK :  without JVD/Nodes/TM/ nl carotid upstrokes bilaterally   LUNGS: no acc muscle use, clear to A and P bilaterally without cough on insp or exp maneuvers   CV:  RRR  no s3 or murmur or increase in P2, no edema   ABD:  soft and nontender with nl excursion in the supine position. No bruits or organomegaly, bowel sounds nl  MS:  warm without deformities, calf tenderness, cyanosis or clubbing  SKIN: warm and dry without lesions    NEURO:  alert, approp, no deficits   12/09/11 Ct with contrast 1. No definite evidence of metastatic disease.  2. No acute infiltrate or pulmonary edema.  3. No adenopathy.  4. Status post thyroidectomy.       Assessment & Plan:

## 2012-02-26 NOTE — Assessment & Plan Note (Addendum)
-   Spirometry 12/03/11 non physiologic f/v - CT with contrast 12/09/11 no lung dz - 02/22/2012  Walked RA x 1 laps @ 185 ft each stopped due to  Sob, no desat  Symptoms are markedly disproportionate to objective findings and not clear this is a lung problem but pt does appear to have difficult airway management issues. DDX of  difficult airways managment all start with A and  include Adherence, Ace Inhibitors, Acid Reflux, Active Sinus Disease, Alpha 1 Antitripsin deficiency, Anxiety masquerading as Airways dz,  ABPA,  allergy(esp in young), Aspiration (esp in elderly), Adverse effects of DPI,  Active smokers, plus two Bs  = Bronchiectasis and Beta blocker use..and one C= CHF  ? Acid reflux > no better on rx  ? Anxiety > usually a dx of exclusion  ? Something else like upper airway obstruction related to vc paralysis > not supported by spirometry > will repeat with full set pft's  Will need reconditioning and full pft's next step

## 2012-03-21 ENCOUNTER — Encounter (HOSPITAL_COMMUNITY): Payer: Self-pay

## 2012-03-21 ENCOUNTER — Emergency Department (HOSPITAL_COMMUNITY): Payer: Medicaid Other

## 2012-03-21 ENCOUNTER — Emergency Department (HOSPITAL_COMMUNITY)
Admission: EM | Admit: 2012-03-21 | Discharge: 2012-03-21 | Disposition: A | Discharge status: Home / Self Care | Payer: Medicaid Other | Attending: Emergency Medicine | Admitting: Emergency Medicine

## 2012-03-21 DIAGNOSIS — M519 Unspecified thoracic, thoracolumbar and lumbosacral intervertebral disc disorder: Secondary | ICD-10-CM | POA: Insufficient documentation

## 2012-03-21 DIAGNOSIS — Z3202 Encounter for pregnancy test, result negative: Secondary | ICD-10-CM | POA: Insufficient documentation

## 2012-03-21 DIAGNOSIS — M129 Arthropathy, unspecified: Secondary | ICD-10-CM | POA: Insufficient documentation

## 2012-03-21 DIAGNOSIS — E039 Hypothyroidism, unspecified: Secondary | ICD-10-CM | POA: Insufficient documentation

## 2012-03-21 DIAGNOSIS — Z79899 Other long term (current) drug therapy: Secondary | ICD-10-CM | POA: Insufficient documentation

## 2012-03-21 DIAGNOSIS — K625 Hemorrhage of anus and rectum: Secondary | ICD-10-CM | POA: Insufficient documentation

## 2012-03-21 DIAGNOSIS — K5289 Other specified noninfective gastroenteritis and colitis: Secondary | ICD-10-CM | POA: Insufficient documentation

## 2012-03-21 DIAGNOSIS — K219 Gastro-esophageal reflux disease without esophagitis: Secondary | ICD-10-CM | POA: Insufficient documentation

## 2012-03-21 DIAGNOSIS — Z85850 Personal history of malignant neoplasm of thyroid: Secondary | ICD-10-CM | POA: Insufficient documentation

## 2012-03-21 DIAGNOSIS — Z8709 Personal history of other diseases of the respiratory system: Secondary | ICD-10-CM | POA: Insufficient documentation

## 2012-03-21 DIAGNOSIS — IMO0001 Reserved for inherently not codable concepts without codable children: Secondary | ICD-10-CM | POA: Insufficient documentation

## 2012-03-21 DIAGNOSIS — I10 Essential (primary) hypertension: Secondary | ICD-10-CM | POA: Insufficient documentation

## 2012-03-21 DIAGNOSIS — Z8742 Personal history of other diseases of the female genital tract: Secondary | ICD-10-CM | POA: Insufficient documentation

## 2012-03-21 DIAGNOSIS — K529 Noninfective gastroenteritis and colitis, unspecified: Secondary | ICD-10-CM

## 2012-03-21 DIAGNOSIS — Z8739 Personal history of other diseases of the musculoskeletal system and connective tissue: Secondary | ICD-10-CM | POA: Insufficient documentation

## 2012-03-21 DIAGNOSIS — D571 Sickle-cell disease without crisis: Secondary | ICD-10-CM | POA: Insufficient documentation

## 2012-03-21 DIAGNOSIS — J45909 Unspecified asthma, uncomplicated: Secondary | ICD-10-CM | POA: Insufficient documentation

## 2012-03-21 DIAGNOSIS — Z8669 Personal history of other diseases of the nervous system and sense organs: Secondary | ICD-10-CM | POA: Insufficient documentation

## 2012-03-21 DIAGNOSIS — N259 Disorder resulting from impaired renal tubular function, unspecified: Secondary | ICD-10-CM | POA: Insufficient documentation

## 2012-03-21 LAB — COMPREHENSIVE METABOLIC PANEL
ALT: 20 U/L (ref 0–35)
AST: 21 U/L (ref 0–37)
Albumin: 4.1 g/dL (ref 3.5–5.2)
Alkaline Phosphatase: 89 U/L (ref 39–117)
BUN: 9 mg/dL (ref 6–23)
CO2: 30 mEq/L (ref 19–32)
Calcium: 9.8 mg/dL (ref 8.4–10.5)
Chloride: 101 mEq/L (ref 96–112)
Creatinine, Ser: 0.62 mg/dL (ref 0.50–1.10)
GFR calc Af Amer: >90 mL/min (ref >90)
GFR calc non Af Amer: >90 mL/min (ref >90)
Glucose, Bld: 111 mg/dL — ABNORMAL HIGH (ref 70–99)
Potassium: 3.8 mEq/L (ref 3.5–5.1)
Sodium: 140 mEq/L (ref 135–145)
Total Bilirubin: 0.3 mg/dL (ref 0.3–1.2)
Total Protein: 8.2 g/dL (ref 6.0–8.3)

## 2012-03-21 LAB — CBC WITH DIFFERENTIAL/PLATELET
Basophils Absolute: 0 10*3/uL (ref 0.0–0.1)
Basophils Relative: 0 % (ref 0–1)
Eosinophils Absolute: 0 10*3/uL (ref 0.0–0.7)
Eosinophils Relative: 0 % (ref 0–5)
HCT: 36.3 % (ref 36.0–46.0)
Hemoglobin: 12.6 g/dL (ref 12.0–15.0)
Lymphocytes Relative: 7 % — ABNORMAL LOW (ref 12–46)
Lymphs Abs: 1.3 10*3/uL (ref 0.7–4.0)
MCH: 29.9 pg (ref 26.0–34.0)
MCHC: 34.7 g/dL (ref 30.0–36.0)
MCV: 86.2 fL (ref 78.0–100.0)
Monocytes Absolute: 1.1 10*3/uL — ABNORMAL HIGH (ref 0.1–1.0)
Monocytes Relative: 6 % (ref 3–12)
Neutro Abs: 16.7 10*3/uL — ABNORMAL HIGH (ref 1.7–7.7)
Neutrophils Relative %: 87 % — ABNORMAL HIGH (ref 43–77)
Platelets: 416 10*3/uL — ABNORMAL HIGH (ref 150–400)
RBC: 4.21 MIL/uL (ref 3.87–5.11)
RDW: 13.8 % (ref 11.5–15.5)
WBC: 19.1 10*3/uL — ABNORMAL HIGH (ref 4.0–10.5)

## 2012-03-21 LAB — URINALYSIS, ROUTINE W REFLEX MICROSCOPIC
Bilirubin Urine: NEGATIVE
Glucose, UA: NEGATIVE mg/dL
Ketones, ur: NEGATIVE mg/dL
Nitrite: NEGATIVE
Protein, ur: NEGATIVE mg/dL
Specific Gravity, Urine: 1.008 (ref 1.005–1.030)
Urobilinogen, UA: 0.2 mg/dL (ref 0.0–1.0)
pH: 8 (ref 5.0–8.0)

## 2012-03-21 LAB — OCCULT BLOOD, POC DEVICE: Fecal Occult Bld: POSITIVE

## 2012-03-21 LAB — URINE MICROSCOPIC-ADD ON

## 2012-03-21 LAB — PREGNANCY, URINE: Preg Test, Ur: NEGATIVE

## 2012-03-21 LAB — LACTIC ACID, PLASMA: Lactic Acid, Venous: 0.7 mmol/L (ref 0.5–2.2)

## 2012-03-21 LAB — LIPASE, BLOOD: Lipase: 23 U/L (ref 11–59)

## 2012-03-21 MED ORDER — SODIUM CHLORIDE 0.9 % IV BOLUS (SEPSIS)
1000.0000 mL | Freq: Once | INTRAVENOUS | Status: AC
  Administered 2012-03-21: 1000 mL via INTRAVENOUS

## 2012-03-21 MED ORDER — CIPROFLOXACIN HCL 500 MG PO TABS: 500.0000 mg | ORAL_TABLET | Freq: Two times a day (BID) | ORAL | Status: DC

## 2012-03-21 MED ORDER — CIPROFLOXACIN HCL 500 MG PO TABS
500.0000 mg | ORAL_TABLET | Freq: Once | ORAL | Status: AC
  Administered 2012-03-21: 500 mg via ORAL
  Filled 2012-03-21: qty 1

## 2012-03-21 MED ORDER — HYDROMORPHONE HCL 4 MG PO TABS: 4.0000 mg | ORAL_TABLET | ORAL | Status: DC | PRN

## 2012-03-21 MED ORDER — METRONIDAZOLE 500 MG PO TABS: 500.0000 mg | ORAL_TABLET | Freq: Three times a day (TID) | ORAL | Status: DC

## 2012-03-21 MED ORDER — ONDANSETRON HCL 4 MG/2ML IJ SOLN
4.0000 mg | Freq: Once | INTRAMUSCULAR | Status: AC
  Administered 2012-03-21: 4 mg via INTRAVENOUS
  Filled 2012-03-21: qty 2

## 2012-03-21 MED ORDER — IOHEXOL 300 MG/ML  SOLN
80.0000 mL | Freq: Once | INTRAMUSCULAR | Status: AC | PRN
  Administered 2012-03-21: 80 mL via INTRAVENOUS

## 2012-03-21 MED ORDER — HYDROMORPHONE HCL PF 1 MG/ML IJ SOLN
1.0000 mg | INTRAMUSCULAR | Status: AC
  Administered 2012-03-21: 1 mg via INTRAVENOUS
  Filled 2012-03-21: qty 1

## 2012-03-21 MED ORDER — MORPHINE SULFATE 4 MG/ML IJ SOLN
4.0000 mg | Freq: Once | INTRAMUSCULAR | Status: AC | PRN
  Administered 2012-03-21: 4 mg via INTRAVENOUS
  Filled 2012-03-21: qty 1

## 2012-03-21 MED ORDER — MORPHINE SULFATE 4 MG/ML IJ SOLN: 4.0000 mg | Freq: Once | INTRAMUSCULAR | Status: DC | PRN

## 2012-03-21 MED ORDER — DICYCLOMINE HCL 10 MG/ML IM SOLN
20.0000 mg | Freq: Once | INTRAMUSCULAR | Status: AC
  Administered 2012-03-21: 20 mg via INTRAMUSCULAR
  Filled 2012-03-21: qty 2

## 2012-03-21 MED ORDER — ONDANSETRON HCL 4 MG PO TABS: 4.0000 mg | ORAL_TABLET | Freq: Four times a day (QID) | ORAL | Status: DC

## 2012-03-21 MED ORDER — METRONIDAZOLE 500 MG PO TABS
500.0000 mg | ORAL_TABLET | Freq: Once | ORAL | Status: AC
  Administered 2012-03-21: 500 mg via ORAL
  Filled 2012-03-21: qty 1

## 2012-03-21 NOTE — ED Notes (Signed)
Report given to [place], Charity fundraiser.  Pt to go to CDU 3.  Will call CT to notify.

## 2012-03-21 NOTE — ED Provider Notes (Signed)
History     CSN: [id number]  Arrival date & time 03/21/12  0805   First MD Initiated Contact with Patient 03/21/12 [phone]      Chief Complaint  Patient presents with  . Abdominal Pain  . Rectal Bleeding    (Consider location/radiation/quality/duration/timing/severity/associated sxs/prior treatment) HPI  Pt to the ER with complaints of RLQ and LLQ abdominal crampy pain with pressure. She would have the symptoms of cramps and then would pass about a teaspoon of blood without much stool. The symptoms started at 12:00pm and she has had 4  Bloody bowel movements. She denies severe pain. She denies nausea or vomiting, recent weight loss, fevers, chills or diarrhea. She denies hx of abdominal surgeries or bowel obstructions. nad vss  Past Medical History  Diagnosis Date  . Hypertension   . GERD (gastroesophageal reflux disease)   . Sickle cell anemia     trait  . Neuromuscular disorder     fibromyalgia  . Thyroid disease   . Arthritis   . Asthma   . Dyspnea   . Degenerative disk disease   . Baker's cyst     left knee  . Hypothyroidism   . Cancer     thyroid  . Complication of anesthesia     has awakened in past  . H/O migraine   . Interstitial cystitis   . Lower back pain   . H/O fibromyalgia   . Chronic pelvic pain in female 06/2000  . H/O dysmenorrhea 1997  . Menses, irregular 1997  . Abnormal Pap smear 07/15/1998  . Candida vaginitis 04/24/1999  . Nabothian cyst 04/24/1999  . Endometriosis 01/18/01  . Fibroid 04/2000  . Hydradenitis 2003  . Postpartum breast pain 07/17/02  . H/O folliculitis   . Breast tenderness in female     Right  . Ovarian cyst, right 2008    Past Surgical History  Procedure Date  . Thyroidectomy   . Breast enhancement surgery     bilateral  . Achilles tendon repair 2007    left  . Vein surgery   . Bunionectomy 2011    left  . Temporal artery biopsy / ligation 2010  . Biopsy thyroid 2009  . Laparoscopy   . Hydradenitis excision 06/29/2011     Procedure: EXCISION HYDRADENITIS AXILLA;  Surgeon: Rosalio Macadamia, MD;  Location: [facility];  Service: Plastics;;  right breast hydradenitis excioion  . Bladder surgery   . Dilation and curettage of uterus 05/29/05  . Tonsillectomy 1991  . Pilonidal cystectomy 1983    Family History  Problem Relation Age of Onset  . Cancer Paternal Aunt     breast, colon    History  Substance Use Topics  . Smoking status: Never Smoker   . Smokeless tobacco: Never Used  . Alcohol Use: Yes     Comment: rarely    OB History    Grav Para Term Preterm Abortions TAB SAB Ect Mult Living   1 1        1       Review of Systems   Review of Systems  Gen: no weight loss, fevers, chills, night sweats  Eyes: no discharge or drainage, no occular pain or visual changes  Nose: no epistaxis or rhinorrhea  Mouth: no dental pain, no sore throat  Neck: no neck pain  Lungs:No wheezing, coughing or hemoptysis CV: no chest pain, palpitations, dependent edema or orthopnea  Abd: + crampy abdominal pain, nausea, loose stool and hematochezia  GU: no dysuria or gross hematuria  MSK:  No abnormalities  Neuro: no headache, no focal neurologic deficits  Skin: no abnormalities Psyche: negative.    Allergies  Bactrim; Ibuprofen; Latex; Nexium; Nsaids; and Sulfa antibiotics  Home Medications   Current Outpatient Rx  Name  Route  Sig  Dispense  Refill  . CETIRIZINE HCL 10 MG PO TABS   Oral   Take 10 mg by mouth daily.         . CYCLOBENZAPRINE HCL 10 MG PO TABS   Oral   Take 10 mg by mouth 3 (three) times daily as needed. For muscle spasms.         . FUROSEMIDE 20 MG PO TABS   Oral   Take 20 mg by mouth daily as needed. For fluid         . HYDROCODONE-ACETAMINOPHEN 10-325 MG PO TABS   Oral   Take 1 tablet by mouth 3 (three) times daily as needed. For pain.         Marland Kitchen LEVOTHYROXINE SODIUM 112 MCG PO TABS   Oral   Take 112 mcg by mouth daily. Monday and Friday take 1.5  tab than take 1 tab all other days         . HAIR/SKIN/NAILS PO   Oral   Take 1 capsule by mouth daily.         Marland Kitchen OLMESARTAN MEDOXOMIL-HCTZ 20-12.5 MG PO TABS   Oral   Take 0.5 tablets by mouth daily.         Marland Kitchen OMEPRAZOLE-SODIUM BICARBONATE 40-1100 MG PO CAPS   Oral   Take 1 capsule by mouth daily.         Marland Kitchen OXYMORPHONE HCL ER 10 MG PO T12A   Oral   Take 10 mg by mouth 3 (three) times daily.          Marland Kitchen PREGABALIN 50 MG PO CAPS   Oral   Take 50 mg by mouth daily.         Marland Kitchen VITAMIN D (ERGOCALCIFEROL) 50000 UNITS PO CAPS   Oral   Take 50,000 Units by mouth every 7 (seven) days. Take on Saturdays.           BP 113/71  Pulse 114  Temp 99.3 F (37.4 C)  Resp 16  SpO2 99%  LMP 03/09/2012  Physical Exam  Nursing note and vitals reviewed. Constitutional: She appears well-developed and well-nourished. No distress.  HENT:  Head: Normocephalic and atraumatic.  Eyes: Pupils are equal, round, and reactive to light.  Neck: Normal range of motion. Neck supple.  Cardiovascular: Normal rate and regular rhythm.   Pulmonary/Chest: Effort normal.  Abdominal: Soft. She exhibits no distension and no mass. There is tenderness (RLQ and LLQ). There is no rebound and no guarding.  Genitourinary: Rectal exam shows anal tone normal. Guaiac positive stool.  Neurological: She is alert.  Skin: Skin is warm and dry.    ED Course  Procedures (including critical care time)   Labs Reviewed  URINALYSIS, ROUTINE W REFLEX MICROSCOPIC  PREGNANCY, URINE  STOOL CULTURE  CBC WITH DIFFERENTIAL  COMPREHENSIVE METABOLIC PANEL  LIPASE, BLOOD   No results found.   No diagnosis found.    MDM  Pt symptoms suspicious for colitis. Will move to CDU to await for CT abd/pelv results.   I have discussed pt with Trixie Dredge, PA-C.        Dorthula Matas, PA 03/21/12 1051

## 2012-03-21 NOTE — ED Provider Notes (Signed)
10:51 AM Patient to move to CDU holding for CT abd/pelvis.  Sign out received from [employer], PA-C (Pod B/D).  Pt with crampy lower abdominal pain with small amt hematochezia.  CT abd/pelvis pending.  Concern for colitis.    11:45 AM Pt has been to CT, awaiting results.  Pt reports pain remained 6/10, only mild relief from medications.  Mild persistent nausea.  Gi doctor is Buccini.  Pt has colonoscopy scheduled for Nov 20. On exam, pt is A&O, NAD, abd soft, diffusely tender worst in LLQ, no guarding, no rebound.  Will redose pain medication.    CT shows descending colitis.  Discussed results with Tiffany and Dr Karma Ganja.  Will add lactic acid given radiologist's inclusion of ischemic colitis in differential.  If normal, pt to be d/c home with abx and GI follow up.    1:24 PM Discussed results with patient.  Patient is comfortable appearing after dilaudid.  Pt is already on Vicodin 10s and opana at home for chronic pain.  States she is unable to take percocet due to severe itching.  I have discussed safety with pain medications at length with patient.  I have advised that she attempt taking the dilaudid only and holding her vicodin for now, continue her opana.  If her pain is uncontrolled with this she may need to take the vicodin as well.  Discussed concerns about over medication, I have also advised that she have family present to monitor her if she decides she needs to take all of her medications (as prescribed).  Pt also to be d/c on cipro, flagyl - first doses given here.  Pt to follow up with GI doctor, has an appointment already for Nov 20.  Discussed all results with patient.  Discussed return precautions.  Pt verbalizes understanding, agrees with plan.    Results for orders placed during the hospital encounter of 03/21/12  URINALYSIS, ROUTINE W REFLEX MICROSCOPIC      Component Value Range   Color, Urine YELLOW  YELLOW   APPearance CLEAR  CLEAR   Specific Gravity, Urine 1.008  1.005 - 1.030     pH 8.0  5.0 - 8.0   Glucose, UA NEGATIVE  NEGATIVE mg/dL   Hgb urine dipstick SMALL (*) NEGATIVE   Bilirubin Urine NEGATIVE  NEGATIVE   Ketones, ur NEGATIVE  NEGATIVE mg/dL   Protein, ur NEGATIVE  NEGATIVE mg/dL   Urobilinogen, UA 0.2  0.0 - 1.0 mg/dL   Nitrite NEGATIVE  NEGATIVE   Leukocytes, UA TRACE (*) NEGATIVE  PREGNANCY, URINE      Component Value Range   Preg Test, Ur NEGATIVE  NEGATIVE  CBC WITH DIFFERENTIAL      Component Value Range   WBC 19.1 (*) 4.0 - 10.5 K/uL   RBC 4.21  3.87 - 5.11 MIL/uL   Hemoglobin 12.6  12.0 - 15.0 g/dL   HCT 40.9  81.1 - 91.4 %   MCV 86.2  78.0 - 100.0 fL   MCH 29.9  26.0 - 34.0 pg   MCHC 34.7  30.0 - 36.0 g/dL   RDW 78.2  95.6 - 21.3 %   Platelets 416 (*) 150 - 400 K/uL   Neutrophils Relative 87 (*) 43 - 77 %   Neutro Abs 16.7 (*) 1.7 - 7.7 K/uL   Lymphocytes Relative 7 (*) 12 - 46 %   Lymphs Abs 1.3  0.7 - 4.0 K/uL   Monocytes Relative 6  3 - 12 %   Monocytes Absolute  1.1 (*) 0.1 - 1.0 K/uL   Eosinophils Relative 0  0 - 5 %   Eosinophils Absolute 0.0  0.0 - 0.7 K/uL   Basophils Relative 0  0 - 1 %   Basophils Absolute 0.0  0.0 - 0.1 K/uL  COMPREHENSIVE METABOLIC PANEL      Component Value Range   Sodium 140  135 - 145 mEq/L   Potassium 3.8  3.5 - 5.1 mEq/L   Chloride 101  96 - 112 mEq/L   CO2 30  19 - 32 mEq/L   Glucose, Bld 111 (*) 70 - 99 mg/dL   BUN 9  6 - 23 mg/dL   Creatinine, Ser 0.98  0.50 - 1.10 mg/dL   Calcium 9.8  8.4 - 11.9 mg/dL   Total Protein 8.2  6.0 - 8.3 g/dL   Albumin 4.1  3.5 - 5.2 g/dL   AST 21  0 - 37 U/L   ALT 20  0 - 35 U/L   Alkaline Phosphatase 89  39 - 117 U/L   Total Bilirubin 0.3  0.3 - 1.2 mg/dL   GFR calc non Af Amer >90  >90 mL/min   GFR calc Af Amer >90  >90 mL/min  LIPASE, BLOOD      Component Value Range   Lipase 23  11 - 59 U/L  OCCULT BLOOD, POC DEVICE      Component Value Range   Fecal Occult Bld POSITIVE    URINE MICROSCOPIC-ADD ON      Component Value Range   Squamous Epithelial  / LPF RARE  RARE   WBC, UA 0-2  <3 WBC/hpf   RBC / HPF 3-6  <3 RBC/hpf   Bacteria, UA RARE  RARE   Ct Abdomen Pelvis W Contrast  03/21/2012  *RADIOLOGY REPORT*  Clinical Data: Lower abdominal pain, rectal bleeding.  CT ABDOMEN AND PELVIS WITH CONTRAST  Technique:  Multidetector CT imaging of the abdomen and pelvis was performed following the standard protocol during bolus administration of intravenous contrast.  Contrast: 80mL OMNIPAQUE IOHEXOL 300 MG/ML  SOLN  Comparison: 10/27/2010  Findings: Limited images through the lung bases demonstrate no significant appreciable abnormality. The heart size is within normal limits. No pleural or pericardial effusion.  Low attenuation of the liver suggests fatty infiltration.  No focal abnormality.  Unremarkable spleen, pancreas, biliary system, adrenal glands.  There is an incompletely characterized 1.5 cm hypodensity arising medially from the interpolar/lower pole left kidney.  The density is higher than of simple fluid.  There is enhancement versus pseudoenhancement on the delayed phase.  No hydronephrosis or hydroureter.  No bowel obstruction.  There is mild pericolonic fat stranding along the descending colon.  Circumferential wall thickening is nonspecific given incomplete distension.  The appendix is within normal limits.  No free intraperitoneal air or fluid.  No lymphadenopathy.  Partially decompressed bladder is thin-walled. Nonspecific heterogeneous enhancement of the uterus.  No adnexal mass.  Normal caliber aorta and branch vessels.  L5-S1 and L4-5 degenerative changes.  No acute osseous abnormality.  IMPRESSION: Nonspecific colitis of the descending colon. The most common differential includes infectious, inflammatory, and ischemic considerations.  Consider GI follow up and colonoscopy if warranted.  1.5 cm left renal lesion is incompletely characterized.  Recommend ultrasound or renal MRI to further characterize.   Original Report Authenticated By: Jearld Lesch, M.D.       [place], [place] 03/21/12 1557

## 2012-03-21 NOTE — ED Provider Notes (Signed)
Medical screening examination/treatment/procedure(s) were performed by non-physician practitioner and as supervising physician I was immediately available for consultation/collaboration.  Ethelda Chick, MD 03/21/12 1121

## 2012-03-21 NOTE — ED Notes (Signed)
Pt states she began having abd pain last night and felt pressure and had a BM which was normal.  Pain went away after that and later came back.  The next BM was "loose and greasy looking".  Pt states she then had 4 episodes of passing blood with BM.  Pt has picture on her phone.

## 2012-03-21 NOTE — ED Provider Notes (Signed)
Medical screening examination/treatment/procedure(s) were performed by non-physician practitioner and as supervising physician I was immediately available for consultation/collaboration.  Ethelda Chick, MD 03/21/12 [phone]

## 2012-04-05 ENCOUNTER — Ambulatory Visit: Payer: Medicaid Other | Admitting: Internal Medicine

## 2012-04-06 ENCOUNTER — Other Ambulatory Visit: Payer: Self-pay | Admitting: Gastroenterology

## 2012-04-06 ENCOUNTER — Other Ambulatory Visit: Payer: Self-pay | Admitting: Internal Medicine

## 2012-04-06 DIAGNOSIS — R0602 Shortness of breath: Secondary | ICD-10-CM

## 2012-04-07 ENCOUNTER — Ambulatory Visit: Payer: Medicaid Other | Admitting: Internal Medicine

## 2012-04-08 ENCOUNTER — Ambulatory Visit (INDEPENDENT_AMBULATORY_CARE_PROVIDER_SITE_OTHER): Payer: Medicaid Other | Admitting: Internal Medicine

## 2012-04-08 ENCOUNTER — Encounter: Payer: Self-pay | Admitting: Internal Medicine

## 2012-04-08 VITALS — BP 138/98 | HR 95 | Temp 98.3°F | Ht 63.0 in | Wt 216.0 lb

## 2012-04-08 DIAGNOSIS — Z23 Encounter for immunization: Secondary | ICD-10-CM

## 2012-04-08 DIAGNOSIS — R0602 Shortness of breath: Secondary | ICD-10-CM

## 2012-04-08 LAB — PULMONARY FUNCTION TEST

## 2012-04-08 NOTE — Progress Notes (Signed)
  Subjective:    Patient ID: Alexis [name], female    DOB: 1966-01-01  MRN: [id number]   Brief patient profile:  46 yobf never smoked with allergies as child from age 46 through first year of college of college rx by [facility] allergist and did ok with it until her 46's with increase increase seasonal allergy and need for zyrtec then onset in 2011 sob > referred 02/22/2012 to pulmonary clinic by Dr Tyson Dense. ENT is [facility] at [facility]   02/22/2012 1st pulmonary eval cc indolent onset x 2 years progressive doe/fatigue to point where hard to get to mailbox,  Daily symptoms, assoc with sorethroat and trouble swallowing dx with VC paralysis 2010 p thyroid surgery in 2010. rx zergerid for obvious hb finished about 6 weeks prior to OV   s obvious  Worsening sob p stopped it - was taking once daily am.  Sob at rest - can't tolerate nexium.  Assoc with dry cough day > night.  rec wt control by cal balance  04/08/2012 f/u ov/Velna Hedgecock cc no change sob > further hx is that gets sob/tired talking just as much as walking.  No obvious daytime variabilty or assoc chronic cough or cp or chest tightness, subjective wheeze overt sinus or hb symptoms. No unusual exp hx    Sleeping ok without nocturnal  or early am exacerbation  of respiratory  c/o's or need for noct saba. Also denies any obvious fluctuation of symptoms with weather or environmental changes or other aggravating or alleviating factors except as outlined above   ROS  The following are not active complaints unless bolded sore throat, dysphagia, dental problems, itching, sneezing,  nasal congestion or excess/ purulent secretions, ear ache,   fever, chills, sweats, unintended wt loss, pleuritic or exertional cp, hemoptysis,  orthopnea pnd or leg swelling, presyncope, palpitations, heartburn, abdominal pain, anorexia, nausea, vomiting, diarrhea  or change in bowel or urinary habits, change in stools or urine, dysuria,hematuria,  rash, arthralgias, visual complaints,  headache, numbness weakness or ataxia or problems with walking or coordination,  change in mood/affect or memory.             Objective:   Physical Exam  amb bf nad   .wt 216 04/08/2012  Wt Readings from Last 3 Encounters:  02/22/12 222 lb 6.4 oz (100.88 kg)  06/22/11 200 lb (90.719 kg)  06/22/11 200 lb (90.719 kg)     HEENT: nl dentition, turbinates, and orophanx. Nl external ear canals without cough reflex   NECK :  without JVD/Nodes/TM/ nl carotid upstrokes bilaterally   LUNGS: no acc muscle use, clear to A and P bilaterally without cough on insp or exp maneuvers   CV:  RRR  no s3 or murmur or increase in P2, no edema   ABD:  soft and nontender with nl excursion in the supine position. No bruits or organomegaly, bowel sounds nl  MS:  warm without deformities, calf tenderness, cyanosis or clubbing      12/09/11 Ct with contrast 1. No definite evidence of metastatic disease.  2. No acute infiltrate or pulmonary edema.  3. No adenopathy.  4. Status post thyroidectomy.       Assessment & Plan:

## 2012-04-08 NOTE — Patient Instructions (Addendum)
Your lung function is perfectly normal except the part related your throat which may be partly related to acid but mostly related to your paralyzed vocal cord but everyone should keep in mind that we are only treating the acid component which is worsened the throat clearing you are doing.   Chlortrimeton is better than zyrtec for throat irritation from nasal drainage  GERD (REFLUX)  is an extremely common cause of respiratory symptoms like throat problems, many times with no significant heartburn at all.    It can be treated with medication, but also with lifestyle changes including avoidance of late meals, excessive alcohol, smoking cessation, and avoid fatty foods, chocolate, peppermint, colas, red wine, and acidic juices such as orange juice.  NO MINT OR MENTHOL PRODUCTS SO NO COUGH DROPS  USE SUGARLESS CANDY INSTEAD (jolley ranchers or Stover's)  NO OIL BASED VITAMINS - use powdered substitutes.  Pulmonary follow up can be as needed.

## 2012-04-08 NOTE — Progress Notes (Signed)
PFT done today. 

## 2012-04-09 NOTE — Assessment & Plan Note (Signed)
-   Spirometry 12/03/11 non physiologic f/v - CT with contrast 12/09/11 no lung dz - 02/22/2012  Walked RA x 1 laps @ 185 ft each stopped due to  Sob, no desat - PFTs 04/08/2012 wnl x somewhat truncated insp loop and erv 58%  I had an extended discussion with the patient today lasting 15 to 20 minutes of a 25 minute visit on the following issues:    She clearly does not have limiting lung dz and not the hx that she is just as sob at rest as with exertion and talking makes it worse are all typical of upper airway problems related to her dx of VC paralysis, especially given the truncated insp loop on pft's > needs ent f/u and rx, nothing further to offer in pulmonary clinic

## 2012-06-07 ENCOUNTER — Other Ambulatory Visit (HOSPITAL_COMMUNITY): Payer: Self-pay | Admitting: Urology

## 2012-06-07 DIAGNOSIS — N281 Cyst of kidney, acquired: Secondary | ICD-10-CM

## 2012-06-15 ENCOUNTER — Ambulatory Visit (HOSPITAL_COMMUNITY): Payer: Medicaid Other

## 2012-06-16 ENCOUNTER — Ambulatory Visit (HOSPITAL_COMMUNITY)
Admission: RE | Admit: 2012-06-16 | Discharge: 2012-06-16 | Disposition: A | Discharge status: Home / Self Care | Payer: Medicaid Other | Source: Ambulatory Visit | Attending: Urology | Admitting: Urology

## 2012-06-16 DIAGNOSIS — N281 Cyst of kidney, acquired: Secondary | ICD-10-CM | POA: Insufficient documentation

## 2012-06-16 DIAGNOSIS — K7689 Other specified diseases of liver: Secondary | ICD-10-CM | POA: Insufficient documentation

## 2012-06-16 LAB — POCT I-STAT, CHEM 8
BUN: 7 mg/dL (ref 6–23)
Calcium, Ion: 1.18 mmol/L (ref 1.12–1.23)
Chloride: 98 mEq/L (ref 96–112)
Creatinine, Ser: 0.9 mg/dL (ref 0.50–1.10)
Glucose, Bld: 98 mg/dL (ref 70–99)
HCT: 37 % (ref 36.0–46.0)
Hemoglobin: 12.6 g/dL (ref 12.0–15.0)
Potassium: 3.1 mEq/L — ABNORMAL LOW (ref 3.5–5.1)
Sodium: 139 mEq/L (ref 135–145)
TCO2: 31 mmol/L (ref 0–100)

## 2012-06-16 MED ORDER — GADOBENATE DIMEGLUMINE 529 MG/ML IV SOLN
20.0000 mL | Freq: Once | INTRAVENOUS | Status: AC | PRN
  Administered 2012-06-16: 20 mL via INTRAVENOUS

## 2012-06-24 ENCOUNTER — Other Ambulatory Visit: Payer: Self-pay | Admitting: *Deleted

## 2012-06-24 DIAGNOSIS — R609 Edema, unspecified: Secondary | ICD-10-CM

## 2012-07-25 ENCOUNTER — Encounter (INDEPENDENT_AMBULATORY_CARE_PROVIDER_SITE_OTHER): Payer: Medicaid Other | Admitting: *Deleted

## 2012-07-25 ENCOUNTER — Ambulatory Visit (INDEPENDENT_AMBULATORY_CARE_PROVIDER_SITE_OTHER): Payer: Medicaid Other | Admitting: Surgery

## 2012-07-25 ENCOUNTER — Encounter: Payer: Self-pay | Admitting: Surgery

## 2012-07-25 VITALS — BP 100/62 | HR 101 | Resp 18 | Ht 62.0 in | Wt 213.0 lb

## 2012-07-25 DIAGNOSIS — I83893 Varicose veins of bilateral lower extremities with other complications: Secondary | ICD-10-CM | POA: Insufficient documentation

## 2012-07-25 DIAGNOSIS — M7989 Other specified soft tissue disorders: Secondary | ICD-10-CM | POA: Insufficient documentation

## 2012-07-25 DIAGNOSIS — R609 Edema, unspecified: Secondary | ICD-10-CM

## 2012-07-25 NOTE — Progress Notes (Signed)
[facility] [place]   Patient name: Alexis [name] MRN: [id number] DOB: 10/20/1965 Sex: female   Referred by: Dr. Concepcion Elk  Reason for referral:  Chief Complaint  Patient presents with  . New Evaluation    C/O swelling bilateral legs, left worse than right, duration 5 years.  Bakers cyst left popliteal off/on Dec. 2012  . Venous Insufficiency    HISTORY OF PRESENT ILLNESS: This is a very pleasant 47 year old female who is referred today for bilateral lower extremity swelling. She states that her legs have been swelling for the past 5 years, however there has been a significant deterioration within the past year. She states that her left leg bothers her more than the right. She denies a history of trauma, however she has had for orthopedic surgeries on her leg. She denies varicose veins on her leg. She has chronic pain and is seen in the pain clinic. She describes her legs in particular the left as getting worse when her Baker's cyst swells up. She feels like her legs become very heavy. She describes as feeling as if a cast is around her left leg from the thigh down. Standing makes these worse. She has tried what sounds like a light compression but did not tolerate this. She does not have any ulcers in her legs.  The patient has a family history of diabetes and peripheral vascular disease. Many people have had to have amputations. For this reason she is very concerned about her legs right now.  Past Medical History  Diagnosis Date  . Hypertension   . GERD (gastroesophageal reflux disease)   . Sickle cell anemia     trait  . Neuromuscular disorder     fibromyalgia  . Thyroid disease   . Arthritis   . Asthma   . Dyspnea   . Degenerative disk disease   . Baker's cyst     left knee  . Hypothyroidism   . Cancer     thyroid  . Complication of anesthesia     has awakened in past  . H/O migraine   . Interstitial cystitis   . Lower back pain   . H/O  fibromyalgia   . Chronic pelvic pain in female 06/2000  . H/O dysmenorrhea 1997  . Menses, irregular 1997  . Abnormal Pap smear 07/15/1998  . Candida vaginitis 04/24/1999  . Nabothian cyst 04/24/1999  . Endometriosis 01/18/01  . Fibroid 04/2000  . Hydradenitis 2003  . Postpartum breast pain 07/17/02  . H/O folliculitis   . Breast tenderness in female     Right  . Ovarian cyst, right 2008    Past Surgical History  Procedure Laterality Date  . Thyroidectomy    . Breast enhancement surgery      bilateral  . Achilles tendon repair  2007    left  . Vein surgery    . Bunionectomy  2011    left  . Temporal artery biopsy / ligation  2010  . Biopsy thyroid  2009  . Laparoscopy    . Hydradenitis excision  06/29/2011    Procedure: EXCISION HYDRADENITIS AXILLA;  Surgeon: Rosalio Macadamia, MD;  Location: [facility];  Service: Plastics;;  right breast hydradenitis excioion  . Bladder surgery    . Dilation and curettage of uterus  05/29/05  . Tonsillectomy  1991  . Pilonidal cystectomy  1983    History   Social History  . Marital Status: Single    Spouse Name:  N/A    Number of Children: 2  . Years of Education: N/A   Occupational History  . Disabled    Social History Main Topics  . Smoking status: Never Smoker   . Smokeless tobacco: Never Used  . Alcohol Use: Yes     Comment: rarely  . Drug Use: No  . Sexually Active: Not on file   Other Topics Concern  . Not on file   Social History Narrative  . No narrative on file    Family History  Problem Relation Age of Onset  . Cancer Paternal Aunt     breast, colon    Allergies as of 07/25/2012 - Review Complete 07/25/2012  Allergen Reaction Noted  . Bactrim Nausea And Vomiting 03/27/2011  . Ibuprofen Nausea And Vomiting 06/22/2011  . Latex Nausea And Vomiting 03/27/2011  . Nexium (esomeprazole) Other (See Comments) 02/19/2012  . Nsaids Nausea And Vomiting 06/22/2011  . Sulfa antibiotics Nausea And Vomiting  08/05/2005    Current Outpatient Prescriptions on File Prior to Visit  Medication Sig Dispense Refill  . cetirizine (ZYRTEC) 10 MG tablet Take 10 mg by mouth daily.      . cyclobenzaprine (FLEXERIL) 10 MG tablet Take 10 mg by mouth 3 (three) times daily as needed. For muscle spasms.      . furosemide (LASIX) 20 MG tablet Take 20 mg by mouth daily as needed. For fluid      . levothyroxine (SYNTHROID, LEVOTHROID) 112 MCG tablet Take 112 mcg by mouth daily. Monday and Friday take 1.5 tab than take 1 tab all other days      . Multiple Vitamins-Minerals (HAIR/SKIN/NAILS PO) Take 1 capsule by mouth daily.      Marland Kitchen olmesartan-hydrochlorothiazide (BENICAR HCT) 20-12.5 MG per tablet Take 0.5 tablets by mouth daily.      Marland Kitchen omeprazole-sodium bicarbonate (ZEGERID) 40-1100 MG per capsule Take 1 capsule by mouth daily.      . ondansetron (ZOFRAN) 4 MG tablet Take 1 tablet (4 mg total) by mouth every 6 (six) hours.  20 tablet  0  . pregabalin (LYRICA) 50 MG capsule Take 75 mg by mouth daily.       . Probiotic Product (ALIGN) 4 MG CAPS Take 1 capsule by mouth daily.      . Vitamin D, Ergocalciferol, (DRISDOL) 50000 UNITS CAPS Take 50,000 Units by mouth every 7 (seven) days. Take on Saturdays.      Marland Kitchen HYDROcodone-acetaminophen (NORCO) 10-325 MG per tablet Take 1 tablet by mouth 3 (three) times daily as needed. For pain.      Marland Kitchen HYDROmorphone (DILAUDID) 4 MG tablet Take 1 tablet (4 mg total) by mouth every 4 (four) hours as needed for pain.  20 tablet  0   No current facility-administered medications on file prior to visit.     REVIEW OF SYSTEMS: Cardiovascular: Positive for chest pain, chest pressure, shortness of breath on lying flat, shortness of breath with exertion, pain in her legs with walking, pain in her feet when lying flat, swelling in her legs. Pulmonary: Positive for productive cough and wheezing  Neurological: Positive for weakness in arms and legs, numbness in her arms and legs, difficulty  speaking and slurred speech, dizziness  Hematologic: No bleeding problems or clotting disorders. Musculoskeletal: No joint pain or joint swelling. Gastrointestinal: No blood in stool or hematemesis Genitourinary:  positive for microscopic hematuria  Psychiatric:: No history of major depression. Integumentary:  positive for rash  Constitutional: No fever  positive for chills .  PHYSICAL EXAMINATION: General: The patient appears their stated age.  Vital signs are BP 100/62  Pulse 101  Resp 18  Ht 5\' 2"  (1.575 m)  Wt 213 lb (96.616 kg)  BMI 38.95 kg/m2  SpO2 98%  LMP 07/12/2012 HEENT:  No gross abnormalities Pulmonary: Respirations are non-labored Musculoskeletal: There are no major deformities.   Neurologic: No focal weakness or paresthesias are detected, Skin: There are no ulcer or rashes noted. Psychiatric: The patient has normal affect. Cardiovascular: There is a regular rate and rhythm without significant murmur appreciated. palpable pedal pulses bilaterally no varicosities   Diagnostic Studies: venous examination was ordered and reviewed today. This shows no evidence of deep vein thrombosis. There is no evidence of superficial vein thrombosis. She has no significant venous insufficiency with the exception of incompetence in the bilateral common femoral vein   Outside Studies/Documentation Historical records were reviewed.  They showed  bilateral leg swelling   Medication Changes:  the patient was given a prescription for 20-30 mm compression thigh-high stockings  Assessment:  bilateral leg swelling, left greater than right  Plan:  I have reviewed the patient's ultrasound with her. I do not feel that venous insufficiency is the etiology for her swelling. This most likely represents lymphedema. I told her that we do not have a cure for this, but there are treatment options. I have scheduled her an appointment for lymphedema clinic. I have also given her a prescription for  compression stockings, 20-30 mm grade, thigh high. I have encouraged her to wear these on a daily basis. We discussed that without compression and treating the swelling she would be at risk for ulcers in the future.     Jorge Ny, M.D. [facility] [place] Office: [phone] Pager:  [phone]

## 2012-08-01 ENCOUNTER — Telehealth: Payer: Self-pay | Admitting: Surgery

## 2012-08-01 NOTE — Telephone Encounter (Signed)
Spoke with pt. Told her [facility] will be contacting her with her appt and that it may take a while. [facility] told me they schedule down the road, not sure when it will be. They should contact us regarding appt as well, order and demographic sent. Phone: [phone]   F: [phone] - kf

## 2012-10-19 ENCOUNTER — Other Ambulatory Visit: Payer: Self-pay | Admitting: Neurosurgery

## 2012-10-19 DIAGNOSIS — M542 Cervicalgia: Secondary | ICD-10-CM

## 2012-10-19 DIAGNOSIS — M549 Dorsalgia, unspecified: Secondary | ICD-10-CM

## 2012-10-26 ENCOUNTER — Ambulatory Visit
Admission: RE | Admit: 2012-10-26 | Discharge: 2012-10-26 | Disposition: A | Discharge status: Home / Self Care | Payer: Medicaid Other | Source: Ambulatory Visit | Attending: Neurosurgery | Admitting: Neurosurgery

## 2012-10-26 DIAGNOSIS — M542 Cervicalgia: Secondary | ICD-10-CM

## 2012-10-26 DIAGNOSIS — M549 Dorsalgia, unspecified: Secondary | ICD-10-CM

## 2013-01-22 ENCOUNTER — Ambulatory Visit (HOSPITAL_BASED_OUTPATIENT_CLINIC_OR_DEPARTMENT_OTHER): Payer: Medicaid Other | Attending: Anesthesiology

## 2013-01-22 VITALS — Ht 63.0 in | Wt 227.0 lb

## 2013-01-22 DIAGNOSIS — G4733 Obstructive sleep apnea (adult) (pediatric): Secondary | ICD-10-CM

## 2013-01-22 DIAGNOSIS — R0989 Other specified symptoms and signs involving the circulatory and respiratory systems: Secondary | ICD-10-CM | POA: Insufficient documentation

## 2013-01-22 DIAGNOSIS — R0609 Other forms of dyspnea: Secondary | ICD-10-CM | POA: Insufficient documentation

## 2013-01-28 DIAGNOSIS — G4733 Obstructive sleep apnea (adult) (pediatric): Secondary | ICD-10-CM

## 2013-01-29 NOTE — Procedures (Signed)
[name], Alexis [name]              ACCOUNT NO.:  [id number]  MEDICAL RECORD NO.:  [id number]          PATIENT TYPE:  OUT  LOCATION:  SLEEP CENTER                 FACILITY:  [facility]  PHYSICIAN:  Clinton D. Maple Hudson, MD, FCCP, FACPDATE OF BIRTH:  [birth year]-Feb-1966  DATE OF STUDY:  01/22/2013                           NOCTURNAL POLYSOMNOGRAM  REFERRING PHYSICIAN:  KWADWO GYARTENG-DAKWA  INDICATION FOR STUDY:  Insomnia with sleep apnea.  EPWORTH SLEEPINESS SCORE:  3/24.  BMI 40.  Weight 227 pounds, height 63 inches, neck 15 inches.  MEDICATIONS:  Home medications are charted for review.  SLEEP ARCHITECTURE:  Total sleep time 384.5 minutes with sleep efficiency 94.4%.  Stage I was 1.4%, stage II 74.5%, stage III 10.9%, REM 13.1% of total sleep time.  Sleep latency 8 minutes, REM latency 88.5 minutes, awake after sleep onset 15 minutes.  Arousal index of 0.6.  Bedtime medication:  Benicar HCT, oxycodone, levothyroxine, Lyrica, Zegerid, furosemide, potassium.  RESPIRATORY DATA:  Apnea-hypopnea index (AHI) 19.7 per hour.  A total of 126 events was scored including 53 obstructive apneas, 21 central apneas, 52 hypopneas.  Most events were associated with supine sleep position.  REM AHI 90.3 per hour.  This is a diagnostic NPSG protocol as ordered, and CPAP was not done.  OXYGEN DATA:  Loud snoring with oxygen desaturation to a nadir of 78% and mean oxygen saturation through the study of 92.4% on room air.  CARDIAC DATA:  Movement/parasomnia:  No significant movement disturbance.  No bathroom trips.  MOVEMENT-PARASOMNIA:  IMPRESSIONS-RECOMMENDATIONS: 1. Unremarkable sleep architecture for Sleep Center environment.     Bedtime medication noted. 2. Moderate obstructive and central sleep apnea/hypopnea syndrome, AHI     19.7 per hour with events mostly associated with supine sleep     position and with REM.  REM AHI 90.3 per hour.  Loud snoring with     oxygen desaturation to a nadir of 78% and  mean oxygen saturation     through the study 92.4% on room air 3. This study was ordered as a diagnostic NPSG protocol without CPAP.     Consider return for dedicated CPAP titration study if appropriate.     Clinton D. Maple Hudson, MD, [facility], FACP Diplomate, American Board of Sleep Medicine    CDY/MEDQ  D:  01/28/2013 10:51:37  T:  01/29/2013 02:53:32  Job:  [id number]

## 2013-04-02 ENCOUNTER — Ambulatory Visit (HOSPITAL_BASED_OUTPATIENT_CLINIC_OR_DEPARTMENT_OTHER): Payer: Medicaid Other | Attending: Anesthesiology

## 2013-04-02 VITALS — Ht 63.0 in | Wt 227.0 lb

## 2013-04-02 DIAGNOSIS — G4710 Hypersomnia, unspecified: Secondary | ICD-10-CM | POA: Insufficient documentation

## 2013-04-02 DIAGNOSIS — G4733 Obstructive sleep apnea (adult) (pediatric): Secondary | ICD-10-CM

## 2013-04-09 DIAGNOSIS — G4733 Obstructive sleep apnea (adult) (pediatric): Secondary | ICD-10-CM

## 2013-04-09 NOTE — Procedures (Signed)
Alexis [name], Alexis NO.:  [id number]  MEDICAL RECORD NO.:  [id number]          PATIENT TYPE:  OUT  LOCATION:  SLEEP CENTER                 FACILITY:  [facility]  PHYSICIAN:  Shiane Wenberg D. Maple Hudson, MD, FCCP, FACPDATE OF BIRTH:  [birth year]  DATE OF STUDY:                           NOCTURNAL POLYSOMNOGRAM  REFERRING PHYSICIAN:  Tonye Royalty, MD  INDICATION FOR STUDY:  Hypersomnia with sleep apnea.  EPWORTH SLEEPINESS SCORE:  3/24.  BMI 40.2, weight 227 pounds, height 63 inches, neck 15 inches.  MEDICATIONS:  Home medications are charted for review.  A baseline diagnostic NPSG on January 22, 2013, recorded AHI 19.7 per hour, with body weight 227 pounds.  CPAP titration is now requested.  SLEEP ARCHITECTURE:  Total sleep time 339.5 minutes with sleep efficiency 85.3%.  Stage I was 3.5%.  Stage II was 76.3%.  Stage III was absent.  REM 20.2% of total sleep time.  Sleep latency 5 minutes.  REM latency 184.5 minutes.  Awake after sleep onset 54 minutes, arousal index 1.6.  Bedtime medication: Benicar HCT, oxycodone, levothyroxine, Lyrica, Zegerid, furosemide, potassium.  RESPIRATORY DATA:  CPAP titration protocol.  CPAP was titrated to 10 CWP, AHI 3.2 per hour.  She wore a medium air fit F10 full face mask with heated humidifier.  OXYGEN DATA:  Snoring was prevented and mean oxygen saturation held 93.8% on room air with CPAP.  CARDIAC DATA:  Sinus rhythm with PVCs.  MOVEMENT/PARASOMNIA:  No significant movement disturbance.  Bathroom x1.  IMPRESSION/RECOMMENDATION: 1. Successful CPAP titration to 10 CWP, AHI 3.2 per hour.  She wore a     medium air fit F10 full face mask with heated humidifier.  Snoring     was prevented and mean oxygen saturation held 93.8% on room air     with CPAP. 2. Baseline diagnostic NPSG on January 22, 2013, had recorded AHI     19.7 per hour.  Body weight was 227 pounds on that study.     Wynona Duhamel D. Maple Hudson, MD, [facility],  FACP Diplomate, American Board of Sleep Medicine    CDY/MEDQ  D:  04/09/2013 10:21:45  T:  04/09/2013 11:16:31  Job:  [id number]

## 2013-05-24 ENCOUNTER — Emergency Department (HOSPITAL_COMMUNITY)
Admission: EM | Admit: 2013-05-24 | Discharge: 2013-05-24 | Disposition: A | Discharge status: Home / Self Care | Payer: Medicaid Other | Attending: Emergency Medicine | Admitting: Emergency Medicine

## 2013-05-24 ENCOUNTER — Encounter (HOSPITAL_COMMUNITY): Payer: Self-pay | Admitting: Emergency Medicine

## 2013-05-24 DIAGNOSIS — Z87448 Personal history of other diseases of urinary system: Secondary | ICD-10-CM | POA: Insufficient documentation

## 2013-05-24 DIAGNOSIS — R1084 Generalized abdominal pain: Secondary | ICD-10-CM | POA: Insufficient documentation

## 2013-05-24 DIAGNOSIS — Z91040 Latex allergy status: Secondary | ICD-10-CM | POA: Insufficient documentation

## 2013-05-24 DIAGNOSIS — R197 Diarrhea, unspecified: Secondary | ICD-10-CM | POA: Insufficient documentation

## 2013-05-24 DIAGNOSIS — IMO0001 Reserved for inherently not codable concepts without codable children: Secondary | ICD-10-CM | POA: Insufficient documentation

## 2013-05-24 DIAGNOSIS — Z8639 Personal history of other endocrine, nutritional and metabolic disease: Secondary | ICD-10-CM | POA: Insufficient documentation

## 2013-05-24 DIAGNOSIS — G8929 Other chronic pain: Secondary | ICD-10-CM | POA: Insufficient documentation

## 2013-05-24 DIAGNOSIS — IMO0002 Reserved for concepts with insufficient information to code with codable children: Secondary | ICD-10-CM | POA: Insufficient documentation

## 2013-05-24 DIAGNOSIS — E039 Hypothyroidism, unspecified: Secondary | ICD-10-CM | POA: Insufficient documentation

## 2013-05-24 DIAGNOSIS — Z8619 Personal history of other infectious and parasitic diseases: Secondary | ICD-10-CM | POA: Insufficient documentation

## 2013-05-24 DIAGNOSIS — Z79899 Other long term (current) drug therapy: Secondary | ICD-10-CM | POA: Insufficient documentation

## 2013-05-24 DIAGNOSIS — I10 Essential (primary) hypertension: Secondary | ICD-10-CM | POA: Insufficient documentation

## 2013-05-24 DIAGNOSIS — J45909 Unspecified asthma, uncomplicated: Secondary | ICD-10-CM | POA: Insufficient documentation

## 2013-05-24 DIAGNOSIS — Z8719 Personal history of other diseases of the digestive system: Secondary | ICD-10-CM | POA: Insufficient documentation

## 2013-05-24 DIAGNOSIS — Z85850 Personal history of malignant neoplasm of thyroid: Secondary | ICD-10-CM | POA: Insufficient documentation

## 2013-05-24 DIAGNOSIS — Z862 Personal history of diseases of the blood and blood-forming organs and certain disorders involving the immune mechanism: Secondary | ICD-10-CM | POA: Insufficient documentation

## 2013-05-24 DIAGNOSIS — M129 Arthropathy, unspecified: Secondary | ICD-10-CM | POA: Insufficient documentation

## 2013-05-24 DIAGNOSIS — Z8742 Personal history of other diseases of the female genital tract: Secondary | ICD-10-CM | POA: Insufficient documentation

## 2013-05-24 DIAGNOSIS — G43909 Migraine, unspecified, not intractable, without status migrainosus: Secondary | ICD-10-CM | POA: Insufficient documentation

## 2013-05-24 LAB — COMPREHENSIVE METABOLIC PANEL
ALT: 10 U/L (ref 0–35)
AST: 13 U/L (ref 0–37)
Albumin: 4.2 g/dL (ref 3.5–5.2)
Alkaline Phosphatase: 84 U/L (ref 39–117)
BUN: 10 mg/dL (ref 6–23)
CO2: 28 mEq/L (ref 19–32)
Calcium: 10.1 mg/dL (ref 8.4–10.5)
Chloride: 104 mEq/L (ref 96–112)
Creatinine, Ser: 0.82 mg/dL (ref 0.50–1.10)
GFR calc Af Amer: >90 mL/min (ref >90)
GFR calc non Af Amer: 84 mL/min — ABNORMAL LOW (ref >90)
Glucose, Bld: 99 mg/dL (ref 70–99)
Potassium: 3.5 mEq/L — ABNORMAL LOW (ref 3.7–5.3)
Sodium: 148 mEq/L — ABNORMAL HIGH (ref 137–147)
Total Bilirubin: 0.6 mg/dL (ref 0.3–1.2)
Total Protein: 8.7 g/dL — ABNORMAL HIGH (ref 6.0–8.3)

## 2013-05-24 LAB — URINE MICROSCOPIC-ADD ON

## 2013-05-24 LAB — CBC WITH DIFFERENTIAL/PLATELET
Basophils Absolute: 0 10*3/uL (ref 0.0–0.1)
Basophils Relative: 0 % (ref 0–1)
Eosinophils Absolute: 0.1 10*3/uL (ref 0.0–0.7)
Eosinophils Relative: 0 % (ref 0–5)
HCT: 40.9 % (ref 36.0–46.0)
Hemoglobin: 14.2 g/dL (ref 12.0–15.0)
Lymphocytes Relative: 13 % (ref 12–46)
Lymphs Abs: 1.7 10*3/uL (ref 0.7–4.0)
MCH: 30.5 pg (ref 26.0–34.0)
MCHC: 34.7 g/dL (ref 30.0–36.0)
MCV: 87.8 fL (ref 78.0–100.0)
Monocytes Absolute: 1 10*3/uL (ref 0.1–1.0)
Monocytes Relative: 7 % (ref 3–12)
Neutro Abs: 10.9 10*3/uL — ABNORMAL HIGH (ref 1.7–7.7)
Neutrophils Relative %: 80 % — ABNORMAL HIGH (ref 43–77)
Platelets: 424 10*3/uL — ABNORMAL HIGH (ref 150–400)
RBC: 4.66 MIL/uL (ref 3.87–5.11)
RDW: 13.9 % (ref 11.5–15.5)
WBC: 13.7 10*3/uL — ABNORMAL HIGH (ref 4.0–10.5)

## 2013-05-24 LAB — URINALYSIS, ROUTINE W REFLEX MICROSCOPIC
Bilirubin Urine: NEGATIVE
Glucose, UA: NEGATIVE mg/dL
Ketones, ur: 15 mg/dL — AB
Leukocytes, UA: NEGATIVE
Nitrite: NEGATIVE
Protein, ur: NEGATIVE mg/dL
Specific Gravity, Urine: 1.02 (ref 1.005–1.030)
Urobilinogen, UA: 0.2 mg/dL (ref 0.0–1.0)
pH: 5.5 (ref 5.0–8.0)

## 2013-05-24 LAB — MAGNESIUM: Magnesium: 2 mg/dL (ref 1.5–2.5)

## 2013-05-24 MED ORDER — LACTATED RINGERS IV BOLUS (SEPSIS)
1000.0000 mL | Freq: Once | INTRAVENOUS | Status: AC
  Administered 2013-05-24: 1000 mL via INTRAVENOUS

## 2013-05-24 MED ORDER — HYDROCODONE-ACETAMINOPHEN 5-325 MG PO TABS: 1.0000 | ORAL_TABLET | Freq: Four times a day (QID) | ORAL | Status: DC | PRN

## 2013-05-24 MED ORDER — ONDANSETRON HCL 4 MG PO TABS: 4.0000 mg | ORAL_TABLET | Freq: Four times a day (QID) | ORAL | Status: DC | PRN

## 2013-05-24 MED ORDER — LOPERAMIDE HCL 2 MG PO CAPS: 2.0000 mg | ORAL_CAPSULE | Freq: Four times a day (QID) | ORAL | Status: DC | PRN

## 2013-05-24 MED ORDER — MORPHINE SULFATE 4 MG/ML IJ SOLN
4.0000 mg | Freq: Once | INTRAMUSCULAR | Status: AC
  Administered 2013-05-24: 4 mg via INTRAVENOUS
  Filled 2013-05-24: qty 1

## 2013-05-24 MED ORDER — ONDANSETRON HCL 4 MG/2ML IJ SOLN
4.0000 mg | Freq: Once | INTRAMUSCULAR | Status: AC
  Administered 2013-05-24: 4 mg via INTRAVENOUS
  Filled 2013-05-24: qty 2

## 2013-05-24 NOTE — ED Provider Notes (Signed)
CSN: [id number]     Arrival date & time 05/24/13  0855 History   First MD Initiated Contact with Patient 05/24/13 1100     Chief Complaint  Patient presents with  . Abdominal Pain  . Diarrhea   (Consider location/radiation/quality/duration/timing/severity/associated sxs/prior Treatment) HPI Comments: Pt comes in with cc of abd pain, and diarrhea. Pt's diarrhea started Saturday night, she has been having 10+ loose, non bloody BM. Her abd pain is intermittent, and diffuse, crampy pain. She has no hx of endometriosis, doesn't think her pain is from that. She has no vaginal d/c, bleeding, no hx of std, and no hx of renal stones. She denies any fevers, nausea, emesis. Pt was seen with same few months ago, CT scan showed a colitis - she saw a GI doctor few days later, and was sx free - with neg scopes. Mother does have hx of IBD. No weight loss, joint pain, rash.  Patient is a 48 y.o. female presenting with abdominal pain and diarrhea. The history is provided by the patient.  Abdominal Pain Associated symptoms: diarrhea   Associated symptoms: no chest pain, no constipation, no cough, no hematuria, no nausea, no shortness of breath and no vomiting   Diarrhea Associated symptoms: abdominal pain   Associated symptoms: no vomiting     Past Medical History  Diagnosis Date  . Hypertension   . GERD (gastroesophageal reflux disease)   . Sickle cell anemia     trait  . Neuromuscular disorder     fibromyalgia  . Thyroid disease   . Arthritis   . Asthma   . Dyspnea   . Degenerative disk disease   . Baker's cyst     left knee  . Hypothyroidism   . Cancer     thyroid  . Complication of anesthesia     has awakened in past  . H/O migraine   . Interstitial cystitis   . Lower back pain   . H/O fibromyalgia   . Chronic pelvic pain in female 06/2000  . H/O dysmenorrhea 1997  . Menses, irregular 1997  . Abnormal Pap smear 07/15/1998  . Candida vaginitis 04/24/1999  . Nabothian cyst 04/24/1999  .  Endometriosis 01/18/01  . Fibroid 04/2000  . Hydradenitis 2003  . Postpartum breast pain 07/17/02  . H/O folliculitis   . Breast tenderness in female     Right  . Ovarian cyst, right 2008   Past Surgical History  Procedure Laterality Date  . Thyroidectomy    . Breast enhancement surgery      bilateral  . Achilles tendon repair  2007    left  . Vein surgery    . Bunionectomy  2011    left  . Temporal artery biopsy / ligation  2010  . Biopsy thyroid  2009  . Laparoscopy    . Hydradenitis excision  06/29/2011    Procedure: EXCISION HYDRADENITIS AXILLA;  Surgeon: Hermelinda Dellen, MD;  Location: [facility];  Service: Plastics;;  right breast hydradenitis excioion  . Bladder surgery    . Dilation and curettage of uterus  05/29/05  . Tonsillectomy  1991  . Pilonidal cystectomy  1983   Family History  Problem Relation Age of Onset  . Cancer Paternal Aunt     breast, colon   History  Substance Use Topics  . Smoking status: Never Smoker   . Smokeless tobacco: Never Used  . Alcohol Use: Yes     Comment: rarely   OB History  Grav Para Term Preterm Abortions TAB SAB Ect Mult Living   1 1        1      Review of Systems  Constitutional: Negative for activity change.  HENT: Negative for facial swelling.   Respiratory: Negative for cough, shortness of breath and wheezing.   Cardiovascular: Negative for chest pain.  Gastrointestinal: Positive for abdominal pain and diarrhea. Negative for nausea, vomiting, constipation, blood in stool and abdominal distention.  Genitourinary: Negative for hematuria and difficulty urinating.  Musculoskeletal: Negative for neck pain.  Skin: Negative for color change.  Neurological: Negative for dizziness, speech difficulty and light-headedness.  Hematological: Does not bruise/bleed easily.  Psychiatric/Behavioral: Negative for confusion.    Allergies  Bactrim; Ibuprofen; Latex; Nexium; Nsaids; and Sulfa antibiotics  Home  Medications   Current Outpatient Rx  Name  Route  Sig  Dispense  Refill  . cetirizine (ZYRTEC) 10 MG tablet   Oral   Take 10 mg by mouth daily.         . cyclobenzaprine (FLEXERIL) 10 MG tablet   Oral   Take 10 mg by mouth 3 (three) times daily as needed. For muscle spasms.         . furosemide (LASIX) 40 MG tablet   Oral   Take 40 mg by mouth daily.         Marland Kitchen levothyroxine (SYNTHROID, LEVOTHROID) 125 MCG tablet   Oral   Take 125 mcg by mouth daily before breakfast.         . Multiple Vitamins-Minerals (HAIR/SKIN/NAILS PO)   Oral   Take 1 capsule by mouth daily.         Marland Kitchen nystatin (MYCOSTATIN) 100000 UNIT/ML suspension   Swish & Spit   Swish and spit 5 mLs 4 (four) times daily as needed (for mouth / throat pain).         Marland Kitchen oxyCODONE (ROXICODONE) 15 MG immediate release tablet   Oral   Take 15 mg by mouth every 4 (four) hours as needed for pain.          Marland Kitchen oxymorphone (OPANA ER) 20 MG 12 hr tablet   Oral   Take 20 mg by mouth every 12 (twelve) hours.         . potassium chloride (K-DUR,KLOR-CON) 10 MEQ tablet   Oral   Take 10 mEq by mouth daily.          . predniSONE (DELTASONE) 5 MG tablet   Oral   Take 5 mg by mouth daily with breakfast.         . pregabalin (LYRICA) 75 MG capsule   Oral   Take 75 mg by mouth daily.         Marland Kitchen PRESCRIPTION MEDICATION   Oral   Take 5 mLs by mouth 2 (two) times daily as needed (for pain). Donnatal  /  Lidocaine suspension         . Vitamin D, Ergocalciferol, (DRISDOL) 50000 UNITS CAPS   Oral   Take 50,000 Units by mouth every 7 (seven) days. Take on Saturdays.          BP 133/86  Pulse 96  Temp(Src) 98.5 F (36.9 C) (Oral)  Resp 18  SpO2 99% Physical Exam  Nursing note and vitals reviewed. Constitutional: She is oriented to person, place, and time. She appears well-developed and well-nourished.  HENT:  Head: Normocephalic and atraumatic.  Eyes: EOM are normal. Pupils are equal, round, and  reactive to light.  Neck:  Neck supple.  Cardiovascular: Normal rate, regular rhythm and normal heart sounds.   No murmur heard. Pulmonary/Chest: Effort normal. No respiratory distress.  Abdominal: Soft. She exhibits no distension. There is no tenderness. There is no rebound and no guarding.  Neurological: She is alert and oriented to person, place, and time.  Skin: Skin is warm and dry.    ED Course  Procedures (including critical care time) Labs Review Labs Reviewed  CBC WITH DIFFERENTIAL - Abnormal; Notable for the following:    WBC 13.7 (*)    Platelets 424 (*)    Neutrophils Relative % 80 (*)    Neutro Abs 10.9 (*)    All other components within normal limits  COMPREHENSIVE METABOLIC PANEL - Abnormal; Notable for the following:    Sodium 148 (*)    Potassium 3.5 (*)    Total Protein 8.7 (*)    GFR calc non Af Amer 84 (*)    All other components within normal limits  URINALYSIS, ROUTINE W REFLEX MICROSCOPIC - Abnormal; Notable for the following:    Hgb urine dipstick LARGE (*)    Ketones, ur 15 (*)    All other components within normal limits  URINE MICROSCOPIC-ADD ON - Abnormal; Notable for the following:    Bacteria, UA FEW (*)    All other components within normal limits  MAGNESIUM   Imaging Review No results found.  EKG Interpretation   None       MDM  No diagnosis found.  Pt comes in with cc of diarrhea and abd pain. Sx same as her previous colitis. No blood, no mucus, no fever, non periotoneal abd. Could be colitis, could be ibd could be other cause for pain and diarrhea. We hydrated the patient in the ED with some LR, labs show some urine ketones. No nausea, emesis  - so no concerns for po hydration issue.  Will d.c with imodium and get her to see GI.   Varney Biles, MD 05/24/13 [id number]

## 2013-05-24 NOTE — ED Notes (Signed)
Pt states she started not feeling well since Saturday. Was dx with colitis in November of 2013 and states her pain and diarrhea this time is just like it was then.

## 2013-05-24 NOTE — ED Notes (Signed)
Vitals rechecked iv nss 1000 ml infused.  Pt c/o being cold.  Blanket given

## 2013-05-24 NOTE — ED Notes (Signed)
MD made aware of pt requesting pain and nausea medication

## 2013-05-24 NOTE — Discharge Instructions (Signed)
Diarrhea Diarrhea is frequent loose and watery bowel movements. It can cause you to feel weak and dehydrated. Dehydration can cause you to become tired and thirsty, have a dry mouth, and have decreased urination that often is dark yellow. Diarrhea is a sign of another problem, most often an infection that will not last long. In most cases, diarrhea typically lasts 2 3 days. However, it can last longer if it is a sign of something more serious. It is important to treat your diarrhea as directed by your caregive to lessen or prevent future episodes of diarrhea. CAUSES  Some common causes include:  Gastrointestinal infections caused by viruses, bacteria, or parasites.  Food poisoning or food allergies.  Certain medicines, such as antibiotics, chemotherapy, and laxatives.  Artificial sweeteners and fructose.  Digestive disorders. HOME CARE INSTRUCTIONS  Ensure adequate fluid intake (hydration): have 1 cup (8 oz) of fluid for each diarrhea episode. Avoid fluids that contain simple sugars or sports drinks, fruit juices, whole milk products, and sodas. Your urine should be clear or pale yellow if you are drinking enough fluids. Hydrate with an oral rehydration solution that you can purchase at pharmacies, retail stores, and online. You can prepare an oral rehydration solution at home by mixing the following ingredients together:    tsp table salt.   tsp baking soda.   tsp salt substitute containing potassium chloride.  1  tablespoons sugar.  1 L (34 oz) of water.  Certain foods and beverages may increase the speed at which food moves through the gastrointestinal (GI) tract. These foods and beverages should be avoided and include:  Caffeinated and alcoholic beverages.  High-fiber foods, such as raw fruits and vegetables, nuts, seeds, and whole grain breads and cereals.  Foods and beverages sweetened with sugar alcohols, such as xylitol, sorbitol, and mannitol.  Some foods may be well  tolerated and may help thicken stool including:  Starchy foods, such as rice, toast, pasta, low-sugar cereal, oatmeal, grits, baked potatoes, crackers, and bagels.  Bananas.  Applesauce.  Add probiotic-rich foods to help increase healthy bacteria in the GI tract, such as yogurt and fermented milk products.  Wash your hands well after each diarrhea episode.  Only take over-the-counter or prescription medicines as directed by your caregiver.  Take a warm bath to relieve any burning or pain from frequent diarrhea episodes. SEEK IMMEDIATE MEDICAL CARE IF:   You are unable to keep fluids down.  You have persistent vomiting.  You have blood in your stool, or your stools are black and tarry.  You do not urinate in 6 8 hours, or there is only a small amount of very dark urine.  You have abdominal pain that increases or localizes.  You have weakness, dizziness, confusion, or lightheadedness.  You have a severe headache.  Your diarrhea gets worse or does not get better.  You have a fever or persistent symptoms for more than 2 3 days.  You have a fever and your symptoms suddenly get worse. MAKE SURE YOU:   Understand these instructions.  Will watch your condition.  Will get help right away if you are not doing well or get worse. Document Released: 04/24/2002 Document Revised: 04/20/2012 Document Reviewed: 01/10/2012 [employer] Patient Information 2014 [employer].  

## 2013-05-24 NOTE — ED Notes (Signed)
Pt c/o lower abd pain with diarrhea similar to when had colitis in past x 2 weeks; pt sts watery dark diarrhea

## 2013-05-24 NOTE — ED Notes (Signed)
Dr. Nanavati at the bedside.  

## 2013-07-25 ENCOUNTER — Emergency Department (HOSPITAL_COMMUNITY)
Admission: EM | Admit: 2013-07-25 | Discharge: 2013-07-25 | Discharge status: Against Medical Advice | Payer: Medicaid Other | Attending: Emergency Medicine | Admitting: Emergency Medicine

## 2013-07-25 ENCOUNTER — Emergency Department (HOSPITAL_COMMUNITY): Payer: Medicaid Other

## 2013-07-25 ENCOUNTER — Emergency Department (HOSPITAL_COMMUNITY)
Admission: EM | Admit: 2013-07-25 | Discharge: 2013-07-25 | Disposition: A | Discharge status: Home / Self Care | Payer: Medicaid Other | Attending: Emergency Medicine | Admitting: Emergency Medicine

## 2013-07-25 ENCOUNTER — Encounter (HOSPITAL_COMMUNITY): Payer: Self-pay | Admitting: Emergency Medicine

## 2013-07-25 DIAGNOSIS — Z91040 Latex allergy status: Secondary | ICD-10-CM | POA: Insufficient documentation

## 2013-07-25 DIAGNOSIS — R6883 Chills (without fever): Secondary | ICD-10-CM | POA: Insufficient documentation

## 2013-07-25 DIAGNOSIS — Z8719 Personal history of other diseases of the digestive system: Secondary | ICD-10-CM | POA: Insufficient documentation

## 2013-07-25 DIAGNOSIS — G8929 Other chronic pain: Secondary | ICD-10-CM | POA: Insufficient documentation

## 2013-07-25 DIAGNOSIS — Z862 Personal history of diseases of the blood and blood-forming organs and certain disorders involving the immune mechanism: Secondary | ICD-10-CM | POA: Insufficient documentation

## 2013-07-25 DIAGNOSIS — R197 Diarrhea, unspecified: Secondary | ICD-10-CM | POA: Insufficient documentation

## 2013-07-25 DIAGNOSIS — Z9889 Other specified postprocedural states: Secondary | ICD-10-CM | POA: Insufficient documentation

## 2013-07-25 DIAGNOSIS — E039 Hypothyroidism, unspecified: Secondary | ICD-10-CM | POA: Insufficient documentation

## 2013-07-25 DIAGNOSIS — R112 Nausea with vomiting, unspecified: Secondary | ICD-10-CM | POA: Insufficient documentation

## 2013-07-25 DIAGNOSIS — G43909 Migraine, unspecified, not intractable, without status migrainosus: Secondary | ICD-10-CM | POA: Insufficient documentation

## 2013-07-25 DIAGNOSIS — I10 Essential (primary) hypertension: Secondary | ICD-10-CM | POA: Insufficient documentation

## 2013-07-25 DIAGNOSIS — Z8742 Personal history of other diseases of the female genital tract: Secondary | ICD-10-CM | POA: Insufficient documentation

## 2013-07-25 DIAGNOSIS — J45909 Unspecified asthma, uncomplicated: Secondary | ICD-10-CM | POA: Insufficient documentation

## 2013-07-25 DIAGNOSIS — M129 Arthropathy, unspecified: Secondary | ICD-10-CM | POA: Insufficient documentation

## 2013-07-25 DIAGNOSIS — IMO0001 Reserved for inherently not codable concepts without codable children: Secondary | ICD-10-CM | POA: Insufficient documentation

## 2013-07-25 DIAGNOSIS — Z85850 Personal history of malignant neoplasm of thyroid: Secondary | ICD-10-CM | POA: Insufficient documentation

## 2013-07-25 DIAGNOSIS — Z8619 Personal history of other infectious and parasitic diseases: Secondary | ICD-10-CM | POA: Insufficient documentation

## 2013-07-25 DIAGNOSIS — K5289 Other specified noninfective gastroenteritis and colitis: Secondary | ICD-10-CM | POA: Insufficient documentation

## 2013-07-25 DIAGNOSIS — R109 Unspecified abdominal pain: Secondary | ICD-10-CM

## 2013-07-25 DIAGNOSIS — R1013 Epigastric pain: Secondary | ICD-10-CM | POA: Insufficient documentation

## 2013-07-25 DIAGNOSIS — Z87448 Personal history of other diseases of urinary system: Secondary | ICD-10-CM | POA: Insufficient documentation

## 2013-07-25 DIAGNOSIS — R319 Hematuria, unspecified: Secondary | ICD-10-CM | POA: Insufficient documentation

## 2013-07-25 DIAGNOSIS — Z79899 Other long term (current) drug therapy: Secondary | ICD-10-CM | POA: Insufficient documentation

## 2013-07-25 DIAGNOSIS — IMO0002 Reserved for concepts with insufficient information to code with codable children: Secondary | ICD-10-CM | POA: Insufficient documentation

## 2013-07-25 DIAGNOSIS — Z3202 Encounter for pregnancy test, result negative: Secondary | ICD-10-CM | POA: Insufficient documentation

## 2013-07-25 DIAGNOSIS — Z872 Personal history of diseases of the skin and subcutaneous tissue: Secondary | ICD-10-CM | POA: Insufficient documentation

## 2013-07-25 LAB — CBC WITH DIFFERENTIAL/PLATELET
Basophils Absolute: 0 10*3/uL (ref 0.0–0.1)
Basophils Relative: 0 % (ref 0–1)
Eosinophils Absolute: 0 10*3/uL (ref 0.0–0.7)
Eosinophils Relative: 0 % (ref 0–5)
HCT: 38.4 % (ref 36.0–46.0)
Hemoglobin: 13.4 g/dL (ref 12.0–15.0)
Lymphocytes Relative: 9 % — ABNORMAL LOW (ref 12–46)
Lymphs Abs: 1.5 10*3/uL (ref 0.7–4.0)
MCH: 29.6 pg (ref 26.0–34.0)
MCHC: 34.9 g/dL (ref 30.0–36.0)
MCV: 85 fL (ref 78.0–100.0)
Monocytes Absolute: 0.9 10*3/uL (ref 0.1–1.0)
Monocytes Relative: 6 % (ref 3–12)
Neutro Abs: 14.1 10*3/uL — ABNORMAL HIGH (ref 1.7–7.7)
Neutrophils Relative %: 85 % — ABNORMAL HIGH (ref 43–77)
Platelets: 449 10*3/uL — ABNORMAL HIGH (ref 150–400)
RBC: 4.52 MIL/uL (ref 3.87–5.11)
RDW: 13.6 % (ref 11.5–15.5)
WBC: 16.6 10*3/uL — ABNORMAL HIGH (ref 4.0–10.5)

## 2013-07-25 LAB — LIPASE, BLOOD: Lipase: 17 U/L (ref 11–59)

## 2013-07-25 LAB — URINALYSIS, ROUTINE W REFLEX MICROSCOPIC
Bilirubin Urine: NEGATIVE
Glucose, UA: NEGATIVE mg/dL
Ketones, ur: 40 mg/dL — AB
Nitrite: NEGATIVE
Protein, ur: 30 mg/dL — AB
Specific Gravity, Urine: 1.015 (ref 1.005–1.030)
Urobilinogen, UA: 0.2 mg/dL (ref 0.0–1.0)
pH: 8.5 — ABNORMAL HIGH (ref 5.0–8.0)

## 2013-07-25 LAB — COMPREHENSIVE METABOLIC PANEL
ALT: 14 U/L (ref 0–35)
AST: 14 U/L (ref 0–37)
Albumin: 4 g/dL (ref 3.5–5.2)
Alkaline Phosphatase: 78 U/L (ref 39–117)
BUN: 9 mg/dL (ref 6–23)
CO2: 21 mEq/L (ref 19–32)
Calcium: 10.1 mg/dL (ref 8.4–10.5)
Chloride: 104 mEq/L (ref 96–112)
Creatinine, Ser: 0.67 mg/dL (ref 0.50–1.10)
GFR calc Af Amer: >90 mL/min (ref >90)
GFR calc non Af Amer: >90 mL/min (ref >90)
Glucose, Bld: 137 mg/dL — ABNORMAL HIGH (ref 70–99)
Potassium: 3.4 mEq/L — ABNORMAL LOW (ref 3.7–5.3)
Sodium: 144 mEq/L (ref 137–147)
Total Bilirubin: 0.4 mg/dL (ref 0.3–1.2)
Total Protein: 8 g/dL (ref 6.0–8.3)

## 2013-07-25 LAB — I-STAT CG4 LACTIC ACID, ED: Lactic Acid, Venous: 2.46 mmol/L — ABNORMAL HIGH (ref 0.5–2.2)

## 2013-07-25 LAB — URINE MICROSCOPIC-ADD ON

## 2013-07-25 LAB — PREGNANCY, URINE: Preg Test, Ur: NEGATIVE

## 2013-07-25 MED ORDER — MORPHINE SULFATE 4 MG/ML IJ SOLN
4.0000 mg | Freq: Once | INTRAMUSCULAR | Status: AC
  Administered 2013-07-25: 4 mg via INTRAVENOUS
  Filled 2013-07-25: qty 1

## 2013-07-25 MED ORDER — ONDANSETRON HCL 4 MG PO TABS: 4.0000 mg | ORAL_TABLET | Freq: Four times a day (QID) | ORAL | Status: DC

## 2013-07-25 MED ORDER — SODIUM CHLORIDE 0.9 % IV BOLUS (SEPSIS)
1000.0000 mL | Freq: Once | INTRAVENOUS | Status: AC
  Administered 2013-07-25: 1000 mL via INTRAVENOUS

## 2013-07-25 MED ORDER — ONDANSETRON HCL 4 MG/2ML IJ SOLN
4.0000 mg | INTRAMUSCULAR | Status: AC
  Administered 2013-07-25: 4 mg via INTRAVENOUS
  Filled 2013-07-25: qty 2

## 2013-07-25 MED ORDER — IOHEXOL 300 MG/ML  SOLN
25.0000 mL | Freq: Once | INTRAMUSCULAR | Status: AC | PRN
  Administered 2013-07-25: 25 mL via ORAL

## 2013-07-25 MED ORDER — IOHEXOL 300 MG/ML  SOLN
100.0000 mL | Freq: Once | INTRAMUSCULAR | Status: AC | PRN
  Administered 2013-07-25: 100 mL via INTRAVENOUS

## 2013-07-25 NOTE — ED Notes (Signed)
CT informed that pt has completed first cup of contrast

## 2013-07-25 NOTE — ED Notes (Signed)
Pt arrived by [facility] with c/o ABD pain since 0100 along with n/v/d. Pt stated to EMS that pain comes in waves. Hx of GI bleed because of Colitis.

## 2013-07-25 NOTE — ED Notes (Signed)
Pt called x 3 no answer 

## 2013-07-25 NOTE — ED Notes (Signed)
Called for triage x5 without response

## 2013-07-25 NOTE — Discharge Instructions (Signed)
Take Zofran as needed for nausea/vomiting. Follow up with your gastroenterologist and your primary doctor regarding your symptoms. Return if symptoms worsen.  Abdominal Pain, Adult Many things can cause abdominal pain. Usually, abdominal pain is not caused by a disease and will improve without treatment. It can often be observed and treated at home. Your health care provider will do a physical exam and possibly order blood tests and X-rays to help determine the seriousness of your pain. However, in many cases, more time must pass before a clear cause of the pain can be found. Before that point, your health care provider may not know if you need more testing or further treatment. HOME CARE INSTRUCTIONS  Monitor your abdominal pain for any changes. The following actions may help to alleviate any discomfort you are experiencing:  Only take over-the-counter or prescription medicines as directed by your health care provider.  Do not take laxatives unless directed to do so by your health care provider.  Try a clear liquid diet (broth, tea, or water) as directed by your health care provider. Slowly move to a bland diet as tolerated. SEEK MEDICAL CARE IF:  You have unexplained abdominal pain.  You have abdominal pain associated with nausea or diarrhea.  You have pain when you urinate or have a bowel movement.  You experience abdominal pain that wakes you in the night.  You have abdominal pain that is worsened or improved by eating food.  You have abdominal pain that is worsened with eating fatty foods. SEEK IMMEDIATE MEDICAL CARE IF:   Your pain does not go away within 2 hours.  You have a fever.  You keep throwing up (vomiting).  Your pain is felt only in portions of the abdomen, such as the right side or the left lower portion of the abdomen.  You pass bloody or black tarry stools. MAKE SURE YOU:  Understand these instructions.   Will watch your condition.   Will get help right  away if you are not doing well or get worse.  Document Released: 02/11/2005 Document Revised: 02/22/2013 Document Reviewed: 01/11/2013 [facility] Patient Information 2014 [employer] [employer].

## 2013-07-25 NOTE — ED Provider Notes (Signed)
CSN: [id number]     Arrival date & time 07/25/13  1538 History   First MD Initiated Contact with Patient 07/25/13 1539     Chief Complaint  Patient presents with  . Abdominal Pain     (Consider location/radiation/quality/duration/timing/severity/associated sxs/prior Treatment) HPI Comments: Patient is a 48 year old female with a history of interstitial cystitis and fibromyalgia who presents to the emergency department for abdominal pain. Patient states her symptoms began at 1 AM today and has been constant since this time. Pain is cramping in nature and present in her epigastric region. She also endorses an intermittent deep aching pain that waxes and wanes in severity. She denies any radiation of her pain. Symptoms associated with nausea, 4 episodes of nonbloody emesis, and diarrhea. Patient has tried Imodium and Bentyl for her symptoms without relief she states she just "threw them back up". Patient states she has had colitis in the past and that her symptoms feel similar. Patient has been followed by Dr. Cristina Gong of gastroenterology for symptoms with most recent followup 6 months ago.  Patient denies associated fever, chest pain, shortness of breath, hematemesis, melena or hematochezia, dysuria, vaginal complaints, numbness/tingling, and weakness. Patient denies history of abdominal surgeries, though SHx is significant for bladder surgery and D&C.  Patient is a 48 y.o. female presenting with abdominal pain. The history is provided by the patient. No language interpreter was used.  Abdominal Pain Associated symptoms: chills, diarrhea, hematuria (states chronic from IC), nausea and vomiting     Past Medical History  Diagnosis Date  . Hypertension   . GERD (gastroesophageal reflux disease)   . Sickle cell anemia     trait  . Neuromuscular disorder     fibromyalgia  . Thyroid disease   . Arthritis   . Asthma   . Dyspnea   . Degenerative disk disease   . Baker's cyst     left knee  .  Hypothyroidism   . Cancer     thyroid  . Complication of anesthesia     has awakened in past  . H/O migraine   . Interstitial cystitis   . Lower back pain   . H/O fibromyalgia   . Chronic pelvic pain in female 06/2000  . H/O dysmenorrhea 1997  . Menses, irregular 1997  . Abnormal Pap smear 07/15/1998  . Candida vaginitis 04/24/1999  . Nabothian cyst 04/24/1999  . Endometriosis 01/18/01  . Fibroid 04/2000  . Hydradenitis 2003  . Postpartum breast pain 07/17/02  . H/O folliculitis   . Breast tenderness in female     Right  . Ovarian cyst, right 2008   Past Surgical History  Procedure Laterality Date  . Thyroidectomy    . Breast enhancement surgery      bilateral  . Achilles tendon repair  2007    left  . Vein surgery    . Bunionectomy  2011    left  . Temporal artery biopsy / ligation  2010  . Biopsy thyroid  2009  . Laparoscopy    . Hydradenitis excision  06/29/2011    Procedure: EXCISION HYDRADENITIS AXILLA;  Surgeon: Hermelinda Dellen, MD;  Location: [facility];  Service: Plastics;;  right breast hydradenitis excioion  . Bladder surgery    . Dilation and curettage of uterus  05/29/05  . Tonsillectomy  1991  . Pilonidal cystectomy  1983   Family History  Problem Relation Age of Onset  . Cancer Paternal Aunt     breast, colon  History  Substance Use Topics  . Smoking status: Never Smoker   . Smokeless tobacco: Never Used  . Alcohol Use: Yes     Comment: rarely   OB History   Grav Para Term Preterm Abortions TAB SAB Ect Mult Living   1 1        1      Review of Systems  Constitutional: Positive for chills.  Gastrointestinal: Positive for nausea, vomiting, abdominal pain and diarrhea. Negative for blood in stool.  Genitourinary: Positive for hematuria (states chronic from IC).  All other systems reviewed and are negative.      Allergies  Bactrim; Ibuprofen; Latex; Nexium; Nsaids; and Sulfa antibiotics  Home Medications   Current  Outpatient Rx  Name  Route  Sig  Dispense  Refill  . cetirizine (ZYRTEC) 10 MG tablet   Oral   Take 10 mg by mouth daily.         . furosemide (LASIX) 40 MG tablet   Oral   Take 40 mg by mouth daily.         Marland Kitchen levothyroxine (SYNTHROID, LEVOTHROID) 125 MCG tablet   Oral   Take 125 mcg by mouth daily before breakfast.         . loperamide (IMODIUM) 2 MG capsule   Oral   Take 1 capsule (2 mg total) by mouth 4 (four) times daily as needed for diarrhea or loose stools.   12 capsule   0   . oxyCODONE (ROXICODONE) 15 MG immediate release tablet   Oral   Take 15 mg by mouth every 4 (four) hours as needed for pain.          Marland Kitchen oxymorphone (OPANA ER) 20 MG 12 hr tablet   Oral   Take 20 mg by mouth every 12 (twelve) hours.         . potassium chloride (K-DUR,KLOR-CON) 10 MEQ tablet   Oral   Take 10 mEq by mouth daily.          . predniSONE (DELTASONE) 5 MG tablet   Oral   Take 5 mg by mouth daily with breakfast.         . pregabalin (LYRICA) 75 MG capsule   Oral   Take 75 mg by mouth daily.         . Vitamin D, Ergocalciferol, (DRISDOL) 50000 UNITS CAPS   Oral   Take 50,000 Units by mouth every 7 (seven) days. Take on Saturdays.         . cyclobenzaprine (FLEXERIL) 10 MG tablet   Oral   Take 10 mg by mouth 3 (three) times daily as needed. For muscle spasms.         . ondansetron (ZOFRAN) 4 MG tablet   Oral   Take 1 tablet (4 mg total) by mouth every 6 (six) hours.   12 tablet   0    BP 111/70  Pulse 99  Resp 18  SpO2 99%  LMP 07/14/2013 Physical Exam  Nursing note and vitals reviewed. Constitutional: She is oriented to person, place, and time. She appears well-developed and well-nourished. No distress.  Patient wailing and moving vigorously in bed, though becomes calm with normal temperament when distracted.  HENT:  Head: Normocephalic and atraumatic.  Eyes: Conjunctivae and EOM are normal. No scleral icterus.  Neck: Normal range of motion.   Cardiovascular: Normal rate, regular rhythm and normal heart sounds.   Pulmonary/Chest: Effort normal and breath sounds normal. No respiratory distress. She has no  wheezes. She has no rales.  Abdominal: Soft. There is tenderness (epigastric and mild in LLQ). There is no rebound and no guarding.  Normal active bowel sounds. No peritoneal signs. No tenderness at McBurney's point. Negative Murphy's sign.  Musculoskeletal: Normal range of motion.  Neurological: She is alert and oriented to person, place, and time.  Skin: Skin is warm and dry. No rash noted. She is not diaphoretic. No erythema. No pallor.  Psychiatric: She has a normal mood and affect. Her behavior is normal.    ED Course  Procedures (including critical care time) Labs Review Labs Reviewed  CBC WITH DIFFERENTIAL - Abnormal; Notable for the following:    WBC 16.6 (*)    Platelets 449 (*)    Neutrophils Relative % 85 (*)    Neutro Abs 14.1 (*)    Lymphocytes Relative 9 (*)    All other components within normal limits  COMPREHENSIVE METABOLIC PANEL - Abnormal; Notable for the following:    Potassium 3.4 (*)    Glucose, Bld 137 (*)    All other components within normal limits  URINALYSIS, ROUTINE W REFLEX MICROSCOPIC - Abnormal; Notable for the following:    pH 8.5 (*)    Hgb urine dipstick SMALL (*)    Ketones, ur 40 (*)    Protein, ur 30 (*)    Leukocytes, UA TRACE (*)    All other components within normal limits  URINE MICROSCOPIC-ADD ON - Abnormal; Notable for the following:    Squamous Epithelial / LPF MANY (*)    All other components within normal limits  I-STAT CG4 LACTIC ACID, ED - Abnormal; Notable for the following:    Lactic Acid, Venous 2.46 (*)    All other components within normal limits  CLOSTRIDIUM DIFFICILE BY PCR  LIPASE, BLOOD  PREGNANCY, URINE  GI PATHOGEN PANEL BY PCR, STOOL   Imaging Review Ct Abdomen Pelvis W Contrast  07/25/2013   CLINICAL DATA Abdominal pain.  EXAM CT ABDOMEN AND PELVIS  WITH CONTRAST  TECHNIQUE Multidetector CT imaging of the abdomen and pelvis was performed using the standard protocol following bolus administration of intravenous contrast.  CONTRAST 195mL OMNIPAQUE IOHEXOL 300 MG/ML  SOLN  COMPARISON CT scan of March 21, 2012.  FINDINGS Moderate degenerative disc disease is noted at L5-S1. Visualized lung bases appear normal.  The liver, spleen and pancreas appear normal. No gallstones are noted. Adrenal glands appear normal. 1.5 cm left renal cyst is again noted and stable. No hydronephrosis or renal obstruction is noted. No renal or ureteral calculi are noted. The appendix appears normal. There is no evidence of bowel obstruction. No abnormal fluid collection is noted. Urinary bladder is decompressed. Uterus appears normal. Ovaries appear normal. No significant adenopathy is noted.  IMPRESSION No significant abnormality seen in the abdomen or pelvis.  SIGNATURE  Electronically Signed   By: Sabino Dick M.D.   On: 07/25/2013 18:08     EKG Interpretation   Date/Time:  Tuesday July 25 2013 15:46:33 EDT Ventricular Rate:  98 PR Interval:  138 QRS Duration: 92 QT Interval:  360 QTC Calculation: 460 R Axis:   57 Text Interpretation:  Sinus rhythm Ventricular premature complex  Borderline abnrm T, anterolateral leads Since last tracing Nonspecific T  wave abnormality NOW PRESENT Confirmed by Karle Starch  MD, Juanda Crumble [phone]) on  07/25/2013 3:52:51 PM      MDM   Final diagnoses:  Abdominal pain    48 year old female presents to the emergency department for abdominal pain  with onset 1 AM today. Symptoms associated with vomiting and diarrhea. On arrival patient appears to be uncomfortable, however, she is easily distractible. Physical exam significant for tenderness to palpation in the epigastric region and left lower quadrant. Abdominal reexaminations over ED course were stable, though CT scan ordered as patient was noted to have a leukocytosis of 16.6 and mildly  elevated lactate. Labs otherwise consistent with priors. Urinalysis suggests dehydration today. Patient hydrated with IV fluids. Pain well controlled with 4 mg IV morphine. Patient tolerated all of oral contrast without emesis.  CT scan today is negative for any emergent intra-abdominal process. Patient has no evidence of obstruction. Gallbladder normal and without evidence of gallstones. The appendix within normal limits. No evidence of diverticulitis. Given improvement in patient's symptoms, ability to tolerate PO, and normal CT imaging, believe she is stable for discharge with outpatient gastroenterology followup. Also advised followup with patient's primary care provider. Patient is noted to take Roxicodone every 4 hours for pain. She will be given a prescription for Zofran to take as needed for nausea. Return precautions discussed and provided. Patient agreeable to plan with no unaddressed concerns.    Antonietta Breach, [place] 07/25/13 1914

## 2013-07-26 LAB — GI PATHOGEN PANEL BY PCR, STOOL
C difficile toxin A/B: NEGATIVE
Campylobacter by PCR: NEGATIVE
Cryptosporidium by PCR: NEGATIVE
E coli (ETEC) LT/ST: NEGATIVE
E coli (STEC): NEGATIVE
E coli 0157 by PCR: NEGATIVE
G lamblia by PCR: NEGATIVE
Norovirus GI/GII: NEGATIVE
Rotavirus A by PCR: POSITIVE
Salmonella by PCR: NEGATIVE
Shigella by PCR: NEGATIVE

## 2013-07-26 LAB — CLOSTRIDIUM DIFFICILE BY PCR: Toxigenic C. Difficile by PCR: NEGATIVE

## 2013-07-26 NOTE — ED Provider Notes (Signed)
Medical screening examination/treatment/procedure(s) were performed by non-physician practitioner and as supervising physician I was immediately available for consultation/collaboration.   EKG Interpretation   Date/Time:  Tuesday July 25 2013 15:46:33 EDT Ventricular Rate:  98 PR Interval:  138 QRS Duration: 92 QT Interval:  360 QTC Calculation: 460 R Axis:   57 Text Interpretation:  Sinus rhythm Ventricular premature complex  Borderline abnrm T, anterolateral leads Since last tracing Nonspecific T  wave abnormality NOW PRESENT Confirmed by Karle Starch  MD, CHARLES [phone]) on  07/25/2013 3:52:51 PM        Charles B. Karle Starch, MD 07/26/13 0001

## 2013-08-29 ENCOUNTER — Other Ambulatory Visit: Payer: Self-pay | Admitting: Orthopedic Surgery

## 2013-09-01 ENCOUNTER — Other Ambulatory Visit: Payer: Self-pay | Admitting: Orthopedic Surgery

## 2013-09-01 NOTE — H&P (Signed)
Alexis [name] is an 48 y.o. female.   Chief Complaint: R knee pain HPI: The patient is a 48 year old female who presents today for follow up of their knee. The patient is being followed for their right knee pain. They are now 8 month(s) out from when symptoms began. Symptoms reported today include: pain, swelling, giving way and instability. Current treatment includes: bracing, activity modification and pain medications. The following medication has been used for pain control: Oxycodone and Opana.  She has noticed this even more since December, with ongoing swelling, tightness, giving way, popping, grinding, and locking. She previously had a steroid injection in the left knee with increased pain after the injection, possible reaction. 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 She is taking Opana and Oxycodone from pain mgmt, Lyrica. She notes no improvement of her knee pain with pain medication. She had previously tried a brace (sleeve) with no significant relief. She also notes some calf cramping. She was off her knees last week with some colitis and it did help decrease her knee pain; the knee is typically worse with activity.  Past Medical History  Diagnosis Date  . Hypertension   . GERD (gastroesophageal reflux disease)   . Sickle cell anemia     trait  . Neuromuscular disorder     fibromyalgia  . Thyroid disease   . Arthritis   . Asthma   . Dyspnea   . Degenerative disk disease   . Baker's cyst     left knee  . Hypothyroidism   . Cancer     thyroid  . Complication of anesthesia     has awakened in past  . H/O migraine   . Interstitial cystitis   . Lower back pain   . H/O fibromyalgia   . Chronic pelvic pain in female 06/2000  . H/O dysmenorrhea 1997  . Menses, irregular 1997  . Abnormal Pap smear 07/15/1998  . Candida vaginitis 04/24/1999  . Nabothian cyst 04/24/1999  . Endometriosis 01/18/01  . Fibroid 04/2000  .  Hydradenitis 2003  . Postpartum breast pain 07/17/02  . H/O folliculitis   . Breast tenderness in female     Right  . Ovarian cyst, right 2008    Past Surgical History  Procedure Laterality Date  . Thyroidectomy    . Breast enhancement surgery      bilateral  . Achilles tendon repair  2007    left  . Vein surgery    . Bunionectomy  2011    left  . Temporal artery biopsy / ligation  2010  . Biopsy thyroid  2009  . Laparoscopy    . Hydradenitis excision  06/29/2011    Procedure: EXCISION HYDRADENITIS AXILLA;  Surgeon: Hermelinda Dellen, MD;  Location: [facility];  Service: Plastics;;  right breast hydradenitis excioion  . Bladder surgery    . Dilation and curettage of uterus  05/29/05  . Tonsillectomy  1991  . Pilonidal cystectomy  1983    Family History  Problem Relation Age of Onset  . Cancer Paternal Aunt     breast, colon   Social History:  reports that she has never smoked. She has never used smokeless tobacco. She reports that she drinks alcohol. She reports that she does not use illicit drugs.  Allergies:  Allergies  Allergen Reactions  . Bactrim Nausea And Vomiting  . Ibuprofen Nausea And Vomiting  . Latex Nausea  And Vomiting  . Nexium [Esomeprazole] Other (See Comments)    Cramps and headaches  . Nsaids Nausea And Vomiting  . Sulfa Antibiotics Nausea And Vomiting     (Not in a hospital admission)  No results found for this or any previous visit (from the past 48 hour(s)). No results found.  Review of Systems  Constitutional: Negative.   HENT: Negative.   Eyes: Negative.   Cardiovascular: Negative.   Gastrointestinal: Negative.   Genitourinary: Negative.   Musculoskeletal: Positive for joint pain.  Skin: Negative.   Neurological: Negative.   Psychiatric/Behavioral: Negative.     There were no vitals taken for this visit. Physical Exam  Constitutional: She is oriented to person, place, and time. She appears well-developed and  well-nourished.  HENT:  Head: Normocephalic and atraumatic.  Eyes: Conjunctivae and EOM are normal. Pupils are equal, round, and reactive to light.  Neck: Normal range of motion. Neck supple.  Cardiovascular: Normal rate and regular rhythm.   Respiratory: Effort normal and breath sounds normal.  GI: Soft. Bowel sounds are normal.  Musculoskeletal:  On exam she is tender in the lateral joint line and the medial joint line. She has patellofemoral pain with compression. It seems to be tracking fairly well. No evidence of DVT. Straight leg raise is negative. Equivocal McMurray's.  Neurological: She is alert and oriented to person, place, and time. She has normal reflexes.  Skin: Skin is warm and dry.  Psychiatric: She has a normal mood and affect.    MRI demonstrates free edge tear of the lateral body of the meniscus, severe patellofemoral DJD, proximal tibiofibular DJD with small cyst projecting from the joint, though she is not tender in that area.  Assessment/Plan 1. Mechanical locking and giving way of the right knee, refractory to rest, cortisone injection, home exercise program. 2. Patellofemoral arthrosis, severe. 3. Incidental cyst, small popliteal cyst, non-symptomatic.  We discussed options of repeat corticosteroid injection, Visco supplementation, and etc. Also discussed arthroscopy, partial lateral meniscectomy due to her continued symptoms, chondroplasty patellofemoral joint, evaluate for tracking, possible lateral release. Indicated she would have residual symptoms related to her severe patellofemoral arthrosis. May require patellofemoral arthroplasty. She would like to proceed with arthroscopy first and Visco supplementation afterwards, but essentially addressing mechanical symptoms for the meniscus and discussed the probability of residual symptoms of her patellofemoral joint.  I had a long discussion with the patient concerning the risks and benefits of knee  arthroscopy including help from the arthroscopic procedure as well as no help from the arthroscopic procedure or worsening of symptoms. Also discussed infection, DVT, PE, anesthetic complications, etc. Also discussed the possibility of repeat arthroscopic surgery required in the future or total knee replacement. I provided the patient with an illustrated handout and discussed it in detail as well as discussed the postoperative and perioperative courses and return to functional activities including work. Need for postoperative DVT prophylaxis was discussed as well.  No history of blood clots, chest pain, or shortness of breath. 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  I had a long discussion with the patient concerning the risks and benefits of knee arthroscopy including help from the arthroscopic procedure as well as no help from the arthroscopic procedure or worsening of symptoms. Also discussed infection, DVT, PE, anesthetic complications, etc. Also discussed the possibility of repeat arthroscopic surgery required in the future or total knee replacement. I provided the patient with an illustrated handout and discussed it in detail as well   as discussed the postoperative and perioperative courses and return to functional activities including work. Need for postoperative DVT prophylaxis was discussed as well.  Plan R knee arthroscopy, partial lateral meniscectomy, debridement   Jaclyn M. Bissell for Dr. Beane 09/01/2013, 8:36 AM    

## 2013-09-04 ENCOUNTER — Emergency Department (HOSPITAL_COMMUNITY): Payer: Medicaid Other

## 2013-09-04 ENCOUNTER — Encounter (HOSPITAL_COMMUNITY): Payer: Self-pay | Admitting: Emergency Medicine

## 2013-09-04 ENCOUNTER — Emergency Department (HOSPITAL_COMMUNITY)
Admission: EM | Admit: 2013-09-04 | Discharge: 2013-09-04 | Disposition: A | Discharge status: Home / Self Care | Payer: Medicaid Other | Attending: Emergency Medicine | Admitting: Emergency Medicine

## 2013-09-04 DIAGNOSIS — Z862 Personal history of diseases of the blood and blood-forming organs and certain disorders involving the immune mechanism: Secondary | ICD-10-CM | POA: Insufficient documentation

## 2013-09-04 DIAGNOSIS — M1990 Unspecified osteoarthritis, unspecified site: Secondary | ICD-10-CM | POA: Insufficient documentation

## 2013-09-04 DIAGNOSIS — Z8619 Personal history of other infectious and parasitic diseases: Secondary | ICD-10-CM | POA: Insufficient documentation

## 2013-09-04 DIAGNOSIS — J45909 Unspecified asthma, uncomplicated: Secondary | ICD-10-CM | POA: Insufficient documentation

## 2013-09-04 DIAGNOSIS — G8929 Other chronic pain: Secondary | ICD-10-CM | POA: Insufficient documentation

## 2013-09-04 DIAGNOSIS — R079 Chest pain, unspecified: Secondary | ICD-10-CM

## 2013-09-04 DIAGNOSIS — G43909 Migraine, unspecified, not intractable, without status migrainosus: Secondary | ICD-10-CM | POA: Insufficient documentation

## 2013-09-04 DIAGNOSIS — Z85850 Personal history of malignant neoplasm of thyroid: Secondary | ICD-10-CM | POA: Insufficient documentation

## 2013-09-04 DIAGNOSIS — F411 Generalized anxiety disorder: Secondary | ICD-10-CM | POA: Insufficient documentation

## 2013-09-04 DIAGNOSIS — R110 Nausea: Secondary | ICD-10-CM | POA: Insufficient documentation

## 2013-09-04 DIAGNOSIS — R5381 Other malaise: Secondary | ICD-10-CM | POA: Insufficient documentation

## 2013-09-04 DIAGNOSIS — Z79899 Other long term (current) drug therapy: Secondary | ICD-10-CM | POA: Insufficient documentation

## 2013-09-04 DIAGNOSIS — I10 Essential (primary) hypertension: Secondary | ICD-10-CM | POA: Insufficient documentation

## 2013-09-04 DIAGNOSIS — R202 Paresthesia of skin: Secondary | ICD-10-CM

## 2013-09-04 DIAGNOSIS — R5383 Other fatigue: Secondary | ICD-10-CM

## 2013-09-04 DIAGNOSIS — Z8742 Personal history of other diseases of the female genital tract: Secondary | ICD-10-CM | POA: Insufficient documentation

## 2013-09-04 DIAGNOSIS — E039 Hypothyroidism, unspecified: Secondary | ICD-10-CM | POA: Insufficient documentation

## 2013-09-04 DIAGNOSIS — Z91040 Latex allergy status: Secondary | ICD-10-CM | POA: Insufficient documentation

## 2013-09-04 DIAGNOSIS — Z872 Personal history of diseases of the skin and subcutaneous tissue: Secondary | ICD-10-CM | POA: Insufficient documentation

## 2013-09-04 DIAGNOSIS — R209 Unspecified disturbances of skin sensation: Secondary | ICD-10-CM | POA: Insufficient documentation

## 2013-09-04 DIAGNOSIS — Z8719 Personal history of other diseases of the digestive system: Secondary | ICD-10-CM | POA: Insufficient documentation

## 2013-09-04 LAB — BASIC METABOLIC PANEL
BUN: 8 mg/dL (ref 6–23)
CO2: 27 mEq/L (ref 19–32)
Calcium: 9 mg/dL (ref 8.4–10.5)
Chloride: 104 mEq/L (ref 96–112)
Creatinine, Ser: 0.71 mg/dL (ref 0.50–1.10)
GFR calc Af Amer: >90 mL/min (ref >90)
GFR calc non Af Amer: >90 mL/min (ref >90)
Glucose, Bld: 104 mg/dL — ABNORMAL HIGH (ref 70–99)
Potassium: 4.3 mEq/L (ref 3.7–5.3)
Sodium: 144 mEq/L (ref 137–147)

## 2013-09-04 LAB — CBC
HCT: 35.1 % — ABNORMAL LOW (ref 36.0–46.0)
Hemoglobin: 11.9 g/dL — ABNORMAL LOW (ref 12.0–15.0)
MCH: 29.3 pg (ref 26.0–34.0)
MCHC: 33.9 g/dL (ref 30.0–36.0)
MCV: 86.5 fL (ref 78.0–100.0)
Platelets: 359 10*3/uL (ref 150–400)
RBC: 4.06 MIL/uL (ref 3.87–5.11)
RDW: 14.7 % (ref 11.5–15.5)
WBC: 11.9 10*3/uL — ABNORMAL HIGH (ref 4.0–10.5)

## 2013-09-04 LAB — I-STAT TROPONIN, ED
Troponin i, poc: 0 ng/mL (ref 0.00–0.08)
Troponin i, poc: 0 ng/mL (ref 0.00–0.08)

## 2013-09-04 MED ORDER — MORPHINE SULFATE 4 MG/ML IJ SOLN
4.0000 mg | Freq: Once | INTRAMUSCULAR | Status: AC
  Administered 2013-09-04: 4 mg via INTRAVENOUS
  Filled 2013-09-04: qty 1

## 2013-09-04 MED ORDER — SODIUM CHLORIDE 0.9 % IV BOLUS (SEPSIS)
1000.0000 mL | Freq: Once | INTRAVENOUS | Status: AC
  Administered 2013-09-04: 1000 mL via INTRAVENOUS

## 2013-09-04 NOTE — Discharge Instructions (Signed)
Follow up with your spine doctor at [facility] and your doctor.   Continue taking your pain meds as prescribed.   Return to ER if you have worse chest pain, shortness of breath, numbness, weakness.

## 2013-09-04 NOTE — ED Provider Notes (Signed)
CSN: [id number]     Arrival date & time 09/04/13  1526 History   First MD Initiated Contact with Patient 09/04/13 1923     Chief Complaint  Patient presents with  . Chest Pain     (Consider location/radiation/quality/duration/timing/severity/associated sxs/prior Treatment) The history is provided by the patient.  Alexis [name] is a 48 y.o. female hx of sickle cell trait, fibromyalgia, HTN here with nausea and chest pain and arm numbness and weakness. She felt nauseous since yesterday. She took half of Phenergan and felt slightly better. Upon hour later she fell asleep and woke up with some left arm heaviness and numbness. Also developed L face numbness as well. She has history of left facial numbness but its worse than usual. She had an episode of blurry vision that resolved. Had some left sided chest pain this morning associated with the arm numbness. Denies history of CAD or cardiac stents.    Past Medical History  Diagnosis Date  . Hypertension   . GERD (gastroesophageal reflux disease)   . Sickle cell anemia     trait  . Neuromuscular disorder     fibromyalgia  . Thyroid disease   . Arthritis   . Asthma   . Dyspnea   . Degenerative disk disease   . Baker's cyst     left knee  . Hypothyroidism   . Cancer     thyroid  . Complication of anesthesia     has awakened in past  . H/O migraine   . Interstitial cystitis   . Lower back pain   . H/O fibromyalgia   . Chronic pelvic pain in female 06/2000  . H/O dysmenorrhea 1997  . Menses, irregular 1997  . Abnormal Pap smear 07/15/1998  . Candida vaginitis 04/24/1999  . Nabothian cyst 04/24/1999  . Endometriosis 01/18/01  . Fibroid 04/2000  . Hydradenitis 2003  . Postpartum breast pain 07/17/02  . H/O folliculitis   . Breast tenderness in female     Right  . Ovarian cyst, right 2008   Past Surgical History  Procedure Laterality Date  . Thyroidectomy    . Breast enhancement surgery      bilateral  . Achilles tendon repair   2007    left  . Vein surgery    . Bunionectomy  2011    left  . Temporal artery biopsy / ligation  2010  . Biopsy thyroid  2009  . Laparoscopy    . Hydradenitis excision  06/29/2011    Procedure: EXCISION HYDRADENITIS AXILLA;  Surgeon: Hermelinda Dellen, MD;  Location: [facility];  Service: Plastics;;  right breast hydradenitis excioion  . Bladder surgery    . Dilation and curettage of uterus  05/29/05  . Tonsillectomy  1991  . Pilonidal cystectomy  1983   Family History  Problem Relation Age of Onset  . Cancer Paternal Aunt     breast, colon   History  Substance Use Topics  . Smoking status: Never Smoker   . Smokeless tobacco: Never Used  . Alcohol Use: Yes     Comment: rarely   OB History   Grav Para Term Preterm Abortions TAB SAB Ect Mult Living   1 1        1      Review of Systems  Cardiovascular: Positive for chest pain.  Neurological: Positive for numbness.  All other systems reviewed and are negative.     Allergies  Bactrim; Ibuprofen; Latex; Nexium; Nsaids; and Sulfa  antibiotics  Home Medications   Prior to Admission medications   Medication Sig Start Date End Date Taking? Authorizing Provider  cetirizine (ZYRTEC) 10 MG tablet Take 10 mg by mouth daily.    Historical Provider, MD  cyclobenzaprine (FLEXERIL) 10 MG tablet Take 10 mg by mouth 3 (three) times daily as needed. For muscle spasms.    Historical Provider, MD  furosemide (LASIX) 40 MG tablet Take 40 mg by mouth daily.    Historical Provider, MD  levothyroxine (SYNTHROID, LEVOTHROID) 125 MCG tablet Take 125 mcg by mouth daily before breakfast.    Historical Provider, MD  loperamide (IMODIUM) 2 MG capsule Take 1 capsule (2 mg total) by mouth 4 (four) times daily as needed for diarrhea or loose stools. 05/24/13   Varney Biles, MD  ondansetron (ZOFRAN) 4 MG tablet Take 1 tablet (4 mg total) by mouth every 6 (six) hours. 07/25/13   Antonietta Breach, PA-C  oxyCODONE (ROXICODONE) 15 MG immediate  release tablet Take 15 mg by mouth every 4 (four) hours as needed for pain.     Historical Provider, MD  oxymorphone (OPANA ER) 20 MG 12 hr tablet Take 20 mg by mouth every 12 (twelve) hours.    Historical Provider, MD  potassium chloride (K-DUR,KLOR-CON) 10 MEQ tablet Take 10 mEq by mouth daily.     Historical Provider, MD  predniSONE (DELTASONE) 5 MG tablet Take 5 mg by mouth daily with breakfast.    Historical Provider, MD  pregabalin (LYRICA) 75 MG capsule Take 75 mg by mouth daily.    Historical Provider, MD  Vitamin D, Ergocalciferol, (DRISDOL) 50000 UNITS CAPS Take 50,000 Units by mouth every 7 (seven) days. Take on Saturdays.    Historical Provider, MD   BP 148/95  Pulse 73  Temp(Src) 98.8 F (37.1 C) (Oral)  Resp 18  Wt 217 lb (98.431 kg)  SpO2 100% Physical Exam  Nursing note and vitals reviewed. Constitutional: She is oriented to person, place, and time. She appears well-developed and well-nourished.  Slightly anxious   HENT:  Head: Normocephalic.  Mouth/Throat: Oropharynx is clear and moist.  Eyes: Conjunctivae are normal. Pupils are equal, round, and reactive to light.  Neck: Normal range of motion. Neck supple.  Cardiovascular: Normal rate, regular rhythm and normal heart sounds.   Pulmonary/Chest: Effort normal and breath sounds normal. No respiratory distress. She has no wheezes. She has no rales.  Abdominal: Soft. Bowel sounds are normal. She exhibits no distension. There is no tenderness. There is no rebound and no guarding.  Musculoskeletal: Normal range of motion. She exhibits no edema and no tenderness.  Neurological: She is alert and oriented to person, place, and time.  Slightly dec sensation L face and arm. Nl strength throughout. Nl reflexes   Skin: Skin is warm and dry.  Psychiatric: She has a normal mood and affect. Her behavior is normal. Judgment and thought content normal.    ED Course  Procedures (including critical care time) Labs Review Labs  Reviewed  CBC - Abnormal; Notable for the following:    WBC 11.9 (*)    Hemoglobin 11.9 (*)    HCT 35.1 (*)    All other components within normal limits  BASIC METABOLIC PANEL - Abnormal; Notable for the following:    Glucose, Bld 104 (*)    All other components within normal limits  I-STAT TROPOININ, ED  Randolm Idol, ED    Imaging Review Dg Chest 2 View  09/04/2013   CLINICAL DATA:  Chest pain.  EXAM: CHEST  2 VIEW  COMPARISON:  11/06/2008 radiographs  FINDINGS: The cardiomediastinal silhouette is unremarkable.  Mild peribronchial thickening is unchanged.  There is no evidence of focal airspace disease, pulmonary edema, suspicious pulmonary nodule/mass, pleural effusion, or pneumothorax. No acute bony abnormalities are identified.  IMPRESSION: No active cardiopulmonary disease.   Electronically Signed   By: Hassan Rowan M.D.   On: 09/04/2013 21:26   Dg Cervical Spine Complete  09/04/2013   CLINICAL DATA:  48 year old female with left arm numbness.  EXAM: CERVICAL SPINE  4+ VIEWS  COMPARISON:  10/26/2012 MRI  FINDINGS: Normal alignment is noted.  There is no evidence of acute fracture or subluxation.  No prevertebral soft tissue swelling is identified.  Mild anterior spondylosis at C5-6 noted.  There is no evidence of bony foraminal narrowing or focal bony abnormality.  IMPRESSION: No acute bony abnormality.  C5-6 spondylosis.   Electronically Signed   By: Hassan Rowan M.D.   On: 09/04/2013 21:25   Ct Head Wo Contrast  09/04/2013   CLINICAL DATA:  Nausea  EXAM: CT HEAD WITHOUT CONTRAST  TECHNIQUE: Contiguous axial images were obtained from the base of the skull through the vertex without intravenous contrast.  COMPARISON:  None.  FINDINGS: No mass effect, midline shift, or acute intracranial hemorrhage. Intact cranium. Brain parenchyma and ventricular system are unremarkable.  IMPRESSION: Negative head CT.   Electronically Signed   By: Maryclare Bean M.D.   On: 09/04/2013 20:56     EKG  Interpretation   Date/Time:  Monday September 04 2013 15:33:17 EDT Ventricular Rate:  84 PR Interval:  150 QRS Duration: 76 QT Interval:  360 QTC Calculation: 425 R Axis:   32 Text Interpretation:  Normal sinus rhythm Nonspecific T wave abnormality  Abnormal ECG No significant change since last tracing Confirmed by Aydrian Halpin   MD, Talishia Betzler ([id number]) on 09/04/2013 7:40:04 PM      MDM   Final diagnoses:  None    JAHMYA ONOFRIO is a 48 y.o. female here with nausea, chest pain, L arm and face numbness. Low risk for ACS so trop x 2 sufficient. Will do ct head to r/o stroke but I think likely from fibromyalgia. If CT head nl she can get neuro workup outpatient.   10:03 PM CT head nl. Trop neg x 2. Neck xray showed C5-C6 anterior spondylosis likely causing her arm symptoms. She has a spine doctor that she has been seeing for her lower back. She has pain meds at home. Recommend outpatient f/u.   Wandra Arthurs, MD 09/04/13 2204

## 2013-09-04 NOTE — ED Notes (Signed)
Per pt having multiple complaints. Pt sts some nausea that started yesterday sts she took 1/2 phenergen and was relieved. sts one hour later she fell asleep and then woke up and left arm felt heavy and was numb. sts resolved a few hours later. sts then today she had a 10 min episode of blurred vision and abdominal discomfort with nausea again.

## 2013-09-06 ENCOUNTER — Encounter (HOSPITAL_BASED_OUTPATIENT_CLINIC_OR_DEPARTMENT_OTHER): Payer: Self-pay | Admitting: *Deleted

## 2013-09-06 NOTE — Progress Notes (Signed)
NPO AFTER MN WITH EXCEPTION CLEAR LIQUIDS UNTIL 0700 (NO CREAM/ MILK PRODUCTS).  ARRIVE AT 1100. CURRENT LAB RESULTS, CXR AND EKG IN CHART AND EPIC.  WILL DO SYMBICORT INHALER AND TAKE ZEGERID, PREDNISONE, SYNTHYROID, BENTYL, OPANA, AND OXYCODONE AM DOS W/ SIPS WATER.  WILL BRING CPAP OXYCODONE 20MG  MEDICATION IN BOTTLE (TAKES Q4H).

## 2013-09-08 ENCOUNTER — Encounter (HOSPITAL_BASED_OUTPATIENT_CLINIC_OR_DEPARTMENT_OTHER)
Admission: RE | Disposition: A | Discharge status: Home / Self Care | Payer: Self-pay | Source: Ambulatory Visit | Attending: Specialist

## 2013-09-08 ENCOUNTER — Encounter (HOSPITAL_BASED_OUTPATIENT_CLINIC_OR_DEPARTMENT_OTHER): Payer: Medicaid Other | Admitting: Certified Registered"

## 2013-09-08 ENCOUNTER — Ambulatory Visit (HOSPITAL_BASED_OUTPATIENT_CLINIC_OR_DEPARTMENT_OTHER)
Admission: RE | Admit: 2013-09-08 | Discharge: 2013-09-08 | Disposition: A | Discharge status: Home / Self Care | Payer: Medicaid Other | Source: Ambulatory Visit | Attending: Specialist | Admitting: Specialist

## 2013-09-08 ENCOUNTER — Encounter (HOSPITAL_BASED_OUTPATIENT_CLINIC_OR_DEPARTMENT_OTHER): Payer: Self-pay | Admitting: Certified Registered"

## 2013-09-08 ENCOUNTER — Ambulatory Visit (HOSPITAL_BASED_OUTPATIENT_CLINIC_OR_DEPARTMENT_OTHER): Payer: Medicaid Other | Admitting: Certified Registered"

## 2013-09-08 DIAGNOSIS — X58XXXA Exposure to other specified factors, initial encounter: Secondary | ICD-10-CM | POA: Insufficient documentation

## 2013-09-08 DIAGNOSIS — Z6838 Body mass index (BMI) 38.0-38.9, adult: Secondary | ICD-10-CM | POA: Insufficient documentation

## 2013-09-08 DIAGNOSIS — S83289A Other tear of lateral meniscus, current injury, unspecified knee, initial encounter: Secondary | ICD-10-CM | POA: Insufficient documentation

## 2013-09-08 DIAGNOSIS — Z91040 Latex allergy status: Secondary | ICD-10-CM | POA: Insufficient documentation

## 2013-09-08 DIAGNOSIS — M794 Hypertrophy of (infrapatellar) fat pad: Secondary | ICD-10-CM | POA: Insufficient documentation

## 2013-09-08 DIAGNOSIS — Z882 Allergy status to sulfonamides status: Secondary | ICD-10-CM | POA: Insufficient documentation

## 2013-09-08 DIAGNOSIS — M2340 Loose body in knee, unspecified knee: Secondary | ICD-10-CM | POA: Insufficient documentation

## 2013-09-08 DIAGNOSIS — Z888 Allergy status to other drugs, medicaments and biological substances status: Secondary | ICD-10-CM | POA: Insufficient documentation

## 2013-09-08 DIAGNOSIS — I10 Essential (primary) hypertension: Secondary | ICD-10-CM | POA: Insufficient documentation

## 2013-09-08 DIAGNOSIS — M1710 Unilateral primary osteoarthritis, unspecified knee: Secondary | ICD-10-CM | POA: Insufficient documentation

## 2013-09-08 DIAGNOSIS — M179 Osteoarthritis of knee, unspecified: Secondary | ICD-10-CM

## 2013-09-08 DIAGNOSIS — E039 Hypothyroidism, unspecified: Secondary | ICD-10-CM | POA: Insufficient documentation

## 2013-09-08 DIAGNOSIS — J45909 Unspecified asthma, uncomplicated: Secondary | ICD-10-CM | POA: Insufficient documentation

## 2013-09-08 DIAGNOSIS — K219 Gastro-esophageal reflux disease without esophagitis: Secondary | ICD-10-CM | POA: Insufficient documentation

## 2013-09-08 DIAGNOSIS — Z886 Allergy status to analgesic agent status: Secondary | ICD-10-CM | POA: Insufficient documentation

## 2013-09-08 DIAGNOSIS — D573 Sickle-cell trait: Secondary | ICD-10-CM | POA: Insufficient documentation

## 2013-09-08 DIAGNOSIS — IMO0001 Reserved for inherently not codable concepts without codable children: Secondary | ICD-10-CM | POA: Insufficient documentation

## 2013-09-08 HISTORY — DX: Obstructive sleep apnea (adult) (pediatric): G47.33

## 2013-09-08 HISTORY — DX: Sickle-cell trait: D57.3

## 2013-09-08 HISTORY — DX: Dependence on other enabling machines and devices: Z99.89

## 2013-09-08 HISTORY — DX: Personal history of colonic polyps: Z86.010

## 2013-09-08 HISTORY — DX: Spondylosis without myelopathy or radiculopathy, cervical region: M47.812

## 2013-09-08 HISTORY — DX: Unilateral primary osteoarthritis, unspecified knee: M17.10

## 2013-09-08 HISTORY — DX: Osteoarthritis of knee, unspecified: M17.9

## 2013-09-08 HISTORY — DX: Postprocedural hypothyroidism: E89.0

## 2013-09-08 HISTORY — DX: Personal history of other diseases of the digestive system: Z87.19

## 2013-09-08 HISTORY — DX: Paralysis of vocal cords and larynx, unilateral: J38.01

## 2013-09-08 HISTORY — DX: Dry eye syndrome of bilateral lacrimal glands: H04.123

## 2013-09-08 HISTORY — PX: KNEE ARTHROSCOPY WITH LATERAL MENISECTOMY: SHX6193

## 2013-09-08 HISTORY — DX: Fibromyalgia: M79.7

## 2013-09-08 HISTORY — DX: Lymphedema, not elsewhere classified: I89.0

## 2013-09-08 HISTORY — DX: Unspecified glaucoma: H40.9

## 2013-09-08 HISTORY — DX: Personal history of colon polyps, unspecified: Z86.0100

## 2013-09-08 HISTORY — DX: Noninfective gastroenteritis and colitis, unspecified: K52.9

## 2013-09-08 HISTORY — DX: Asymptomatic varicose veins of unspecified lower extremity: I83.90

## 2013-09-08 HISTORY — DX: Leiomyoma of uterus, unspecified: D25.9

## 2013-09-08 SURGERY — ARTHROSCOPY, KNEE, WITH LATERAL MENISCECTOMY
Anesthesia: General | Site: Knee | Laterality: Right

## 2013-09-08 MED ORDER — LACTATED RINGERS IV SOLN
INTRAVENOUS | Status: DC
  Administered 2013-09-08: 12:00:00 via INTRAVENOUS
  Filled 2013-09-08: qty 1000

## 2013-09-08 MED ORDER — FENTANYL CITRATE 0.05 MG/ML IJ SOLN
25.0000 ug | INTRAMUSCULAR | Status: DC | PRN
  Administered 2013-09-08 (×2): 25 ug via INTRAVENOUS
  Filled 2013-09-08: qty 1

## 2013-09-08 MED ORDER — MIDAZOLAM HCL 2 MG/2ML IJ SOLN
INTRAMUSCULAR | Status: AC
  Filled 2013-09-08: qty 2

## 2013-09-08 MED ORDER — OXYCODONE HCL 5 MG PO TABS
5.0000 mg | ORAL_TABLET | ORAL | Status: DC | PRN
  Filled 2013-09-08: qty 1

## 2013-09-08 MED ORDER — FENTANYL CITRATE 0.05 MG/ML IJ SOLN
INTRAMUSCULAR | Status: DC | PRN
  Administered 2013-09-08: 25 ug via INTRAVENOUS
  Administered 2013-09-08: 50 ug via INTRAVENOUS
  Administered 2013-09-08: 75 ug via INTRAVENOUS

## 2013-09-08 MED ORDER — LIDOCAINE HCL (CARDIAC) 20 MG/ML IV SOLN
INTRAVENOUS | Status: DC | PRN
  Administered 2013-09-08: 60 mg via INTRAVENOUS

## 2013-09-08 MED ORDER — ONDANSETRON HCL 4 MG/2ML IJ SOLN
INTRAMUSCULAR | Status: DC | PRN
  Administered 2013-09-08: 4 mg via INTRAVENOUS

## 2013-09-08 MED ORDER — EPINEPHRINE HCL 1 MG/ML IJ SOLN
INTRAMUSCULAR | Status: DC | PRN
  Administered 2013-09-08: 1 mg

## 2013-09-08 MED ORDER — BUPIVACAINE-EPINEPHRINE 0.5% -1:200000 IJ SOLN
INTRAMUSCULAR | Status: DC | PRN
  Administered 2013-09-08: 20 mL

## 2013-09-08 MED ORDER — PROPOFOL 10 MG/ML IV BOLUS
INTRAVENOUS | Status: DC | PRN
  Administered 2013-09-08: 200 mg via INTRAVENOUS

## 2013-09-08 MED ORDER — LACTATED RINGERS IV SOLN
INTRAVENOUS | Status: DC
  Filled 2013-09-08: qty 1000

## 2013-09-08 MED ORDER — DEXAMETHASONE SODIUM PHOSPHATE 4 MG/ML IJ SOLN
INTRAMUSCULAR | Status: DC | PRN
  Administered 2013-09-08: 10 mg via INTRAVENOUS

## 2013-09-08 MED ORDER — SODIUM CHLORIDE 0.9 % IR SOLN
Status: DC | PRN
  Administered 2013-09-08: 3000 mL

## 2013-09-08 MED ORDER — OXYCODONE HCL 5 MG PO TABS
ORAL_TABLET | ORAL | Status: AC
  Filled 2013-09-08: qty 1

## 2013-09-08 MED ORDER — METOCLOPRAMIDE HCL 5 MG/ML IJ SOLN
10.0000 mg | Freq: Once | INTRAMUSCULAR | Status: DC | PRN
  Filled 2013-09-08: qty 2

## 2013-09-08 MED ORDER — MEPERIDINE HCL 25 MG/ML IJ SOLN
6.2500 mg | INTRAMUSCULAR | Status: DC | PRN
  Filled 2013-09-08: qty 1

## 2013-09-08 MED ORDER — CEFAZOLIN SODIUM-DEXTROSE 2-3 GM-% IV SOLR
2.0000 g | INTRAVENOUS | Status: AC
  Administered 2013-09-08: 2 g via INTRAVENOUS
  Filled 2013-09-08: qty 50

## 2013-09-08 MED ORDER — SUCCINYLCHOLINE CHLORIDE 20 MG/ML IJ SOLN
INTRAMUSCULAR | Status: DC | PRN
  Administered 2013-09-08: 120 mg via INTRAVENOUS

## 2013-09-08 MED ORDER — MIDAZOLAM HCL 5 MG/5ML IJ SOLN
INTRAMUSCULAR | Status: DC | PRN
  Administered 2013-09-08: 2 mg via INTRAVENOUS

## 2013-09-08 MED ORDER — OXYCODONE HCL 5 MG PO TABS
5.0000 mg | ORAL_TABLET | Freq: Once | ORAL | Status: AC
  Administered 2013-09-08: 5 mg via ORAL
  Filled 2013-09-08: qty 1

## 2013-09-08 MED ORDER — OXYCODONE HCL 5 MG PO TABS: ORAL_TABLET | ORAL | Status: DC

## 2013-09-08 MED ORDER — FENTANYL CITRATE 0.05 MG/ML IJ SOLN
INTRAMUSCULAR | Status: AC
  Filled 2013-09-08: qty 4

## 2013-09-08 MED ORDER — FENTANYL CITRATE 0.05 MG/ML IJ SOLN
INTRAMUSCULAR | Status: AC
  Filled 2013-09-08: qty 2

## 2013-09-08 SURGICAL SUPPLY — 46 items
BANDAGE ELASTIC 6 VELCRO ST LF (GAUZE/BANDAGES/DRESSINGS) ×2 IMPLANT
BLADE 4.2CUDA (BLADE) IMPLANT
BLADE CUDA SHAVER 3.5 (BLADE) ×3 IMPLANT
BLADE GREAT WHITE 4.2 (BLADE) IMPLANT
BNDG COHESIVE 6X5 TAN NS LF (GAUZE/BANDAGES/DRESSINGS) ×2 IMPLANT
BOOTIES KNEE HIGH SLOAN (MISCELLANEOUS) ×2 IMPLANT
CANISTER SUCT LVC 12 LTR MEDI- (MISCELLANEOUS) ×2 IMPLANT
CANISTER SUCTION 1200CC (MISCELLANEOUS) IMPLANT
CANISTER SUCTION 2500CC (MISCELLANEOUS) ×1 IMPLANT
CANNULA ACUFLEX KIT 5X76 (CANNULA) IMPLANT
CLOTH BEACON ORANGE TIMEOUT ST (SAFETY) ×2 IMPLANT
CUTTER MENISCUS  4.2MM (BLADE)
CUTTER MENISCUS 4.2MM (BLADE) IMPLANT
DRAPE ARTHROSCOPY W/POUCH 114 (DRAPES) ×2 IMPLANT
DURAPREP 26ML APPLICATOR (WOUND CARE) ×2 IMPLANT
ELECT REM PT RETURN 9FT ADLT (ELECTROSURGICAL)
ELECTRODE REM PT RTRN 9FT ADLT (ELECTROSURGICAL) IMPLANT
GLOVE BIOGEL PI IND STRL 7.5 (GLOVE) IMPLANT
GLOVE BIOGEL PI INDICATOR 7.5 (GLOVE) ×1
GLOVE SURG SS PI 7.5 STRL IVOR (GLOVE) ×1 IMPLANT
GLOVE SURG SS PI 8.0 STRL IVOR (GLOVE) ×2 IMPLANT
GOWN PREVENTION PLUS LG XLONG (DISPOSABLE) ×2 IMPLANT
GOWN STRL REIN XL XLG (GOWN DISPOSABLE) ×2 IMPLANT
GOWN STRL REUS W/TWL XL LVL3 (GOWN DISPOSABLE) ×2 IMPLANT
IV NS IRRIG 3000ML ARTHROMATIC (IV SOLUTION) ×4 IMPLANT
KNEE WRAP E Z 3 GEL PACK (MISCELLANEOUS) ×2 IMPLANT
MINI VAC (SURGICAL WAND) IMPLANT
NDL FILTER BLUNT 18X1 1/2 (NEEDLE) ×1 IMPLANT
NDL SAFETY ECLIPSE 18X1.5 (NEEDLE) ×1 IMPLANT
NEEDLE FILTER BLUNT 18X 1/2SAF (NEEDLE) ×1
NEEDLE FILTER BLUNT 18X1 1/2 (NEEDLE) ×1 IMPLANT
NEEDLE HYPO 18GX1.5 SHARP (NEEDLE) ×2
PACK ARTHROSCOPY DSU (CUSTOM PROCEDURE TRAY) ×2 IMPLANT
PACK BASIN DAY SURGERY FS (CUSTOM PROCEDURE TRAY) ×2 IMPLANT
PADDING CAST COTTON 6X4 STRL (CAST SUPPLIES) ×2 IMPLANT
RESECTOR FULL RADIUS 4.2MM (BLADE) IMPLANT
SET ARTHROSCOPY TUBING (MISCELLANEOUS) ×2
SET ARTHROSCOPY TUBING LN (MISCELLANEOUS) ×1 IMPLANT
SPONGE GAUZE 4X4 12PLY (GAUZE/BANDAGES/DRESSINGS) ×2 IMPLANT
SPONGE GAUZE 4X4 12PLY STER LF (GAUZE/BANDAGES/DRESSINGS) ×1 IMPLANT
SUT ETHILON 4 0 PS 2 18 (SUTURE) ×2 IMPLANT
SYR 30ML LL (SYRINGE) ×2 IMPLANT
SYRINGE 10CC LL (SYRINGE) ×2 IMPLANT
TOWEL OR 17X24 6PK STRL BLUE (TOWEL DISPOSABLE) ×2 IMPLANT
WAND 90 DEG TURBOVAC W/CORD (SURGICAL WAND) IMPLANT
WATER STERILE IRR 500ML POUR (IV SOLUTION) ×2 IMPLANT

## 2013-09-08 NOTE — H&P (View-Only) (Signed)
Alexis [name] is an 48 y.o. female.   Chief Complaint: R knee pain HPI: The patient is a 48 year old female who presents today for follow up of their knee. The patient is being followed for their right knee pain. They are now 8 month(s) out from when symptoms began. Symptoms reported today include: pain, swelling, giving way and instability. Current treatment includes: bracing, activity modification and pain medications. The following medication has been used for pain control: Oxycodone and Opana.  She has noticed this even more since December, with ongoing swelling, tightness, giving way, popping, grinding, and locking. She previously had a steroid injection in the left knee with increased pain after the injection, possible reaction. 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 She is taking Opana and Oxycodone from pain mgmt, Lyrica. She notes no improvement of her knee pain with pain medication. She had previously tried a brace (sleeve) with no significant relief. She also notes some calf cramping. She was off her knees last week with some colitis and it did help decrease her knee pain; the knee is typically worse with activity.  Past Medical History  Diagnosis Date  . Hypertension   . GERD (gastroesophageal reflux disease)   . Sickle cell anemia     trait  . Neuromuscular disorder     fibromyalgia  . Thyroid disease   . Arthritis   . Asthma   . Dyspnea   . Degenerative disk disease   . Baker's cyst     left knee  . Hypothyroidism   . Cancer     thyroid  . Complication of anesthesia     has awakened in past  . H/O migraine   . Interstitial cystitis   . Lower back pain   . H/O fibromyalgia   . Chronic pelvic pain in female 06/2000  . H/O dysmenorrhea 1997  . Menses, irregular 1997  . Abnormal Pap smear 07/15/1998  . Candida vaginitis 04/24/1999  . Nabothian cyst 04/24/1999  . Endometriosis 01/18/01  . Fibroid 04/2000  .  Hydradenitis 2003  . Postpartum breast pain 07/17/02  . H/O folliculitis   . Breast tenderness in female     Right  . Ovarian cyst, right 2008    Past Surgical History  Procedure Laterality Date  . Thyroidectomy    . Breast enhancement surgery      bilateral  . Achilles tendon repair  2007    left  . Vein surgery    . Bunionectomy  2011    left  . Temporal artery biopsy / ligation  2010  . Biopsy thyroid  2009  . Laparoscopy    . Hydradenitis excision  06/29/2011    Procedure: EXCISION HYDRADENITIS AXILLA;  Surgeon: Hermelinda Dellen, MD;  Location: [facility];  Service: Plastics;;  right breast hydradenitis excioion  . Bladder surgery    . Dilation and curettage of uterus  05/29/05  . Tonsillectomy  1991  . Pilonidal cystectomy  1983    Family History  Problem Relation Age of Onset  . Cancer Paternal Aunt     breast, colon   Social History:  reports that she has never smoked. She has never used smokeless tobacco. She reports that she drinks alcohol. She reports that she does not use illicit drugs.  Allergies:  Allergies  Allergen Reactions  . Bactrim Nausea And Vomiting  . Ibuprofen Nausea And Vomiting  . Latex Nausea  And Vomiting  . Nexium [Esomeprazole] Other (See Comments)    Cramps and headaches  . Nsaids Nausea And Vomiting  . Sulfa Antibiotics Nausea And Vomiting     (Not in a hospital admission)  No results found for this or any previous visit (from the past 48 hour(s)). No results found.  Review of Systems  Constitutional: Negative.   HENT: Negative.   Eyes: Negative.   Cardiovascular: Negative.   Gastrointestinal: Negative.   Genitourinary: Negative.   Musculoskeletal: Positive for joint pain.  Skin: Negative.   Neurological: Negative.   Psychiatric/Behavioral: Negative.     There were no vitals taken for this visit. Physical Exam  Constitutional: She is oriented to person, place, and time. She appears well-developed and  well-nourished.  HENT:  Head: Normocephalic and atraumatic.  Eyes: Conjunctivae and EOM are normal. Pupils are equal, round, and reactive to light.  Neck: Normal range of motion. Neck supple.  Cardiovascular: Normal rate and regular rhythm.   Respiratory: Effort normal and breath sounds normal.  GI: Soft. Bowel sounds are normal.  Musculoskeletal:  On exam she is tender in the lateral joint line and the medial joint line. She has patellofemoral pain with compression. It seems to be tracking fairly well. No evidence of DVT. Straight leg raise is negative. Equivocal McMurray's.  Neurological: She is alert and oriented to person, place, and time. She has normal reflexes.  Skin: Skin is warm and dry.  Psychiatric: She has a normal mood and affect.    MRI demonstrates free edge tear of the lateral body of the meniscus, severe patellofemoral DJD, proximal tibiofibular DJD with small cyst projecting from the joint, though she is not tender in that area.  Assessment/Plan 1. Mechanical locking and giving way of the right knee, refractory to rest, cortisone injection, home exercise program. 2. Patellofemoral arthrosis, severe. 3. Incidental cyst, small popliteal cyst, non-symptomatic.  We discussed options of repeat corticosteroid injection, Visco supplementation, and etc. Also discussed arthroscopy, partial lateral meniscectomy due to her continued symptoms, chondroplasty patellofemoral joint, evaluate for tracking, possible lateral release. Indicated she would have residual symptoms related to her severe patellofemoral arthrosis. May require patellofemoral arthroplasty. She would like to proceed with arthroscopy first and Visco supplementation afterwards, but essentially addressing mechanical symptoms for the meniscus and discussed the probability of residual symptoms of her patellofemoral joint.  I had a long discussion with the patient concerning the risks and benefits of knee  arthroscopy including help from the arthroscopic procedure as well as no help from the arthroscopic procedure or worsening of symptoms. Also discussed infection, DVT, PE, anesthetic complications, etc. Also discussed the possibility of repeat arthroscopic surgery required in the future or total knee replacement. I provided the patient with an illustrated handout and discussed it in detail as well as discussed the postoperative and perioperative courses and return to functional activities including work. Need for postoperative DVT prophylaxis was discussed as well.  No history of blood clots, chest pain, or shortness of breath. 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  I had a long discussion with the patient concerning the risks and benefits of knee arthroscopy including help from the arthroscopic procedure as well as no help from the arthroscopic procedure or worsening of symptoms. Also discussed infection, DVT, PE, anesthetic complications, etc. Also discussed the possibility of repeat arthroscopic surgery required in the future or total knee replacement. I provided the patient with an illustrated handout and discussed it in detail as well  as discussed the postoperative and perioperative courses and return to functional activities including work. Need for postoperative DVT prophylaxis was discussed as well.  Plan R knee arthroscopy, partial lateral meniscectomy, debridement   Conley Rolls. Sindi Beckworth for Dr. Tonita Cong 09/01/2013, 8:36 AM

## 2013-09-08 NOTE — Interval H&P Note (Signed)
History and Physical Interval Note:  09/08/2013 1:15 PM  Alexis [name]  has presented today for surgery, with the diagnosis of RIGHT KNEE LATERAL MENISCUS TEAR  The various methods of treatment have been discussed with the patient and family. After consideration of risks, benefits and other options for treatment, the patient has consented to  Procedure(s): RIGHT KNEE ARTHROSCOPY PARTIAL LATERAL MENISCECTOMY AND DEBRIDEMENT  (Right) as a surgical intervention .  The patient's history has been reviewed, patient examined, no change in status, stable for surgery.  I have reviewed the patient's chart and labs.  Questions were answered to the patient's satisfaction.     Johnn Hai

## 2013-09-08 NOTE — Discharge Instructions (Signed)
ARTHROSCOPIC KNEE SURGERY HOME CARE INSTRUCTIONS ° ° °PAIN °You will be expected to have a moderate amount of pain in the affected knee for approximately two weeks.  However, the first two to four days will be the most severe in terms of the pain you will experience.  Prescriptions have been provided for you to take as needed for the pain.  The pain can be markedly reduced by using the ice/compressive bandage given.  Exchange the ice packs whenever they thaw.  During the night, keep the bandage on because it will still provide some compression for the swelling.  Also, keep the leg elevated on pillows above your heart, and this will help alleviate the pain and swelling. ° °MEDICATION °Prescriptions have been provided to take as needed for pain. To prevent blood clots, take Aspirin 325mg daily with a meal if not on a blood thinner and if no history of stomach ulcers. ° °ACTIVITY °It is preferred that you stay on bedrest for approximately 24 hours.  However, you may go to the bathroom with help.  After this, you can start to be up and about progressively more.  Remember that the swelling may still increase after three to four days if you are up and doing too much.  You may put as much weight on the affected leg as pain will allow.  Use your crutches for comfort and safety.  However, as soon as you are able, you may discard the crutches and go without them.  ° °DRESSING °Keep the current dressing as dry as possible.  Two days after your surgery, you may remove the ice/compressive wrap, and surgical dressing.  You may now take a shower, but do not scrub the sounds directly with soap.  Let water rinse over these and gently wipe with your hand.  Reapply band-aids over the puncture wounds and more gauze if needed.  A slight amount of thin drainage can be normal at this time, and do not let it frighten you.  Reapply the ice/compressive wrap.  You may now repeat this every day each time you shower. ° °SYMPTOMS TO REPORT TO  YOUR DOCTOR ° -Extreme pain. ° -Extreme swelling. ° -Temperature above 101 degrees that does not come down with acetaminophen     (Tylenol). ° -Any changes in the feeling, color or movement of your toes. ° -Extreme redness, heat, swelling or drainage at your incision ° °EXERCISE °It is preferred that you begin to exercise on the day of your surgery.  Straight leg raises and short arc quads should be begun the afternoon or evening of surgery and continued until you come back for your follow-up appointment.   Attached is an instruction sheet on how to perform these two simple exercises.  Do these at least three times per day if not more.  You may bend your knee as much as is comfortable.  The puncture wounds may occasionally be slightly uncomfortable with bending of the knee.  Do not let this frighten you.  It is important to keep your knee motion, but do not overdo it.  If you have significant pain, simply do not bend the knee as far.   You will be given more exercises to perform at your first return visit.   ° °RETURN APPOINTMENT °Please make an appointment to be seen by your doctor in 14 days from your surgery. ° °Patient Signature:  ________________________________________________________ ° °Nurse's Signature:  ________________________________________________________ ° ° °Post Anesthesia Home Care Instructions ° °Activity: °Get plenty of   rest for the remainder of the day. A responsible adult should stay with you for 24 hours following the procedure.  °For the next 24 hours, DO NOT: °-Drive a car °-Operate machinery °-Drink alcoholic beverages °-Take any medication unless instructed by your physician °-Make any legal decisions or sign important papers. ° °Meals: °Start with liquid foods such as gelatin or soup. Progress to regular foods as tolerated. Avoid greasy, spicy, heavy foods. If nausea and/or vomiting occur, drink only clear liquids until the nausea and/or vomiting subsides. Call your physician if vomiting  continues. ° °Special Instructions/Symptoms: °Your throat may feel dry or sore from the anesthesia or the breathing tube placed in your throat during surgery. If this causes discomfort, gargle with warm salt water. The discomfort should disappear within 24 hours. ° °

## 2013-09-08 NOTE — Anesthesia Preprocedure Evaluation (Signed)
Anesthesia Evaluation  Patient identified by MRN, date of birth, ID band Patient awake    Reviewed: Allergy & Precautions, H&P , NPO status , Patient's Chart, lab work & pertinent test results, reviewed documented beta blocker date and time   History of Anesthesia Complications (+) PONV and history of anesthetic complications  Airway Mallampati: II TM Distance: >3 FB Neck ROM: full    Dental   Pulmonary shortness of breath and with exertion, asthma , sleep apnea and Continuous Positive Airway Pressure Ventilation ,          Cardiovascular hypertension, On Medications     Neuro/Psych  Neuromuscular disease negative neurological ROS  negative psych ROS   GI/Hepatic Neg liver ROS, GERD-  Medicated and Controlled,  Endo/Other  negative endocrine ROSMorbid obesity  Renal/GU negative Renal ROS  negative genitourinary   Musculoskeletal  (+) Fibromyalgia -  Abdominal   Peds  Hematology negative hematology ROS (+)   Anesthesia Other Findings See surgeon's H&P   Reproductive/Obstetrics negative OB ROS                           Anesthesia Physical  Anesthesia Plan  ASA: III  Anesthesia Plan: General   Post-op Pain Management:    Induction: Intravenous  Airway Management Planned: LMA  Additional Equipment:   Intra-op Plan:   Post-operative Plan: Extubation in OR  Informed Consent: I have reviewed the patients History and Physical, chart, labs and discussed the procedure including the risks, benefits and alternatives for the proposed anesthesia with the patient or authorized representative who has indicated his/her understanding and acceptance.     Plan Discussed with: CRNA and Surgeon  Anesthesia Plan Comments:         Anesthesia Quick Evaluation

## 2013-09-08 NOTE — Transfer of Care (Signed)
Immediate Anesthesia Transfer of Care Note  Patient: Alexis [name]  Procedure(s) Performed: Procedure(s) (LRB): RIGHT KNEE ARTHROSCOPY PARTIAL LATERAL MENISCECTOMY AND DEBRIDEMENT  (Right)  Patient Location: PACU  Anesthesia Type: General  Level of Consciousness: awake, oriented, sedated and patient cooperative  Airway & Oxygen Therapy: Patient Spontanous Breathing and Patient connected to face mask oxygen  Post-op Assessment: Report given to PACU RN and Post -op Vital signs reviewed and stable  Post vital signs: Reviewed and stable  Complications: No apparent anesthesia complications

## 2013-09-08 NOTE — Brief Op Note (Signed)
09/08/2013  2:05 PM  PATIENT:  [name]  48 y.o. female  PRE-OPERATIVE DIAGNOSIS:  RIGHT KNEE LATERAL MENISCUS TEAR  POST-OPERATIVE DIAGNOSIS:  RIGHT KNEE LATERAL MENISCUS TEAR   PROCEDURE:  Procedure(s): RIGHT KNEE ARTHROSCOPY PARTIAL LATERAL MENISCECTOMY AND DEBRIDEMENT  (Right)  SURGEON:  Surgeon(s) and Role:    * Johnn Hai, MD - Primary  PHYSICIAN ASSISTANT:   ASSISTANTS: none   ANESTHESIA:   general  EBL:  Total I/O In: 200 [I.V.:200] Out: -   BLOOD ADMINISTERED:none  DRAINS: none   LOCAL MEDICATIONS USED:  MARCAINE     SPECIMEN:  No Specimen  DISPOSITION OF SPECIMEN:  N/A  COUNTS:  YES  TOURNIQUET:  * No tourniquets in log *  DICTATION: .Other Dictation: Dictation Number [id number]  PLAN OF CARE: Discharge to home after PACU  PATIENT DISPOSITION:  PACU - hemodynamically stable.   Delay start of Pharmacological VTE agent (>24hrs) due to surgical blood loss or risk of bleeding: no

## 2013-09-08 NOTE — Anesthesia Procedure Notes (Signed)
Procedure Name: Intubation Date/Time: 09/08/2013 1:28 PM Performed by: Denna Haggard D Pre-anesthesia Checklist: Patient identified, Emergency Drugs available, Suction available and Patient being monitored Patient Re-evaluated:Patient Re-evaluated prior to inductionOxygen Delivery Method: Circle System Utilized Preoxygenation: Pre-oxygenation with 100% oxygen Intubation Type: IV induction Ventilation: Mask ventilation without difficulty Laryngoscope Size: Mac and 4 Grade View: Grade II Tube type: Oral Tube size: 7.0 mm Number of attempts: 1 Airway Equipment and Method: stylet and oral airway Placement Confirmation: ETT inserted through vocal cords under direct vision,  positive ETCO2 and breath sounds checked- equal and bilateral Secured at: 21 cm Tube secured with: Tape Dental Injury: Teeth and Oropharynx as per pre-operative assessment

## 2013-09-09 ENCOUNTER — Encounter (HOSPITAL_COMMUNITY): Payer: Self-pay | Admitting: Emergency Medicine

## 2013-09-09 ENCOUNTER — Emergency Department (HOSPITAL_COMMUNITY)
Admission: EM | Admit: 2013-09-09 | Discharge: 2013-09-09 | Disposition: A | Discharge status: Home / Self Care | Payer: Medicaid Other | Attending: Emergency Medicine | Admitting: Emergency Medicine

## 2013-09-09 DIAGNOSIS — Z860100 Personal history of colon polyps, unspecified: Secondary | ICD-10-CM | POA: Insufficient documentation

## 2013-09-09 DIAGNOSIS — K219 Gastro-esophageal reflux disease without esophagitis: Secondary | ICD-10-CM | POA: Insufficient documentation

## 2013-09-09 DIAGNOSIS — J45909 Unspecified asthma, uncomplicated: Secondary | ICD-10-CM | POA: Insufficient documentation

## 2013-09-09 DIAGNOSIS — H409 Unspecified glaucoma: Secondary | ICD-10-CM | POA: Insufficient documentation

## 2013-09-09 DIAGNOSIS — M25569 Pain in unspecified knee: Secondary | ICD-10-CM | POA: Insufficient documentation

## 2013-09-09 DIAGNOSIS — G8918 Other acute postprocedural pain: Secondary | ICD-10-CM

## 2013-09-09 DIAGNOSIS — Z8742 Personal history of other diseases of the female genital tract: Secondary | ICD-10-CM | POA: Insufficient documentation

## 2013-09-09 DIAGNOSIS — M129 Arthropathy, unspecified: Secondary | ICD-10-CM | POA: Insufficient documentation

## 2013-09-09 DIAGNOSIS — Z79899 Other long term (current) drug therapy: Secondary | ICD-10-CM | POA: Insufficient documentation

## 2013-09-09 DIAGNOSIS — IMO0002 Reserved for concepts with insufficient information to code with codable children: Secondary | ICD-10-CM | POA: Insufficient documentation

## 2013-09-09 DIAGNOSIS — Z85850 Personal history of malignant neoplasm of thyroid: Secondary | ICD-10-CM | POA: Insufficient documentation

## 2013-09-09 DIAGNOSIS — M1710 Unilateral primary osteoarthritis, unspecified knee: Secondary | ICD-10-CM | POA: Insufficient documentation

## 2013-09-09 DIAGNOSIS — I10 Essential (primary) hypertension: Secondary | ICD-10-CM | POA: Insufficient documentation

## 2013-09-09 DIAGNOSIS — D573 Sickle-cell trait: Secondary | ICD-10-CM | POA: Insufficient documentation

## 2013-09-09 DIAGNOSIS — E890 Postprocedural hypothyroidism: Secondary | ICD-10-CM | POA: Insufficient documentation

## 2013-09-09 DIAGNOSIS — Z91040 Latex allergy status: Secondary | ICD-10-CM | POA: Insufficient documentation

## 2013-09-09 LAB — CBC WITH DIFFERENTIAL/PLATELET
Basophils Absolute: 0 10*3/uL (ref 0.0–0.1)
Basophils Relative: 0 % (ref 0–1)
Eosinophils Absolute: 0 10*3/uL (ref 0.0–0.7)
Eosinophils Relative: 0 % (ref 0–5)
HCT: 35.4 % — ABNORMAL LOW (ref 36.0–46.0)
Hemoglobin: 12 g/dL (ref 12.0–15.0)
Lymphocytes Relative: 17 % (ref 12–46)
Lymphs Abs: 3 10*3/uL (ref 0.7–4.0)
MCH: 28.9 pg (ref 26.0–34.0)
MCHC: 33.9 g/dL (ref 30.0–36.0)
MCV: 85.3 fL (ref 78.0–100.0)
Monocytes Absolute: 1.6 10*3/uL — ABNORMAL HIGH (ref 0.1–1.0)
Monocytes Relative: 9 % (ref 3–12)
Neutro Abs: 12.8 10*3/uL — ABNORMAL HIGH (ref 1.7–7.7)
Neutrophils Relative %: 73 % (ref 43–77)
Platelets: 383 10*3/uL (ref 150–400)
RBC: 4.15 MIL/uL (ref 3.87–5.11)
RDW: 14.7 % (ref 11.5–15.5)
WBC: 17.4 10*3/uL — ABNORMAL HIGH (ref 4.0–10.5)

## 2013-09-09 LAB — I-STAT CHEM 8, ED
BUN: 13 mg/dL (ref 6–23)
Calcium, Ion: 1.13 mmol/L (ref 1.12–1.23)
Chloride: 103 mEq/L (ref 96–112)
Creatinine, Ser: 0.9 mg/dL (ref 0.50–1.10)
Glucose, Bld: 98 mg/dL (ref 70–99)
HCT: 38 % (ref 36.0–46.0)
Hemoglobin: 12.9 g/dL (ref 12.0–15.0)
Potassium: 4.3 mEq/L (ref 3.7–5.3)
Sodium: 142 mEq/L (ref 137–147)
TCO2: 31 mmol/L (ref 0–100)

## 2013-09-09 MED ORDER — HYDROMORPHONE HCL 2 MG PO TABS: 2.0000 mg | ORAL_TABLET | ORAL | Status: DC | PRN

## 2013-09-09 MED ORDER — HYDROMORPHONE HCL PF 2 MG/ML IJ SOLN
2.0000 mg | Freq: Once | INTRAMUSCULAR | Status: AC
  Administered 2013-09-09: 2 mg via INTRAVENOUS
  Filled 2013-09-09: qty 1

## 2013-09-09 MED ORDER — HYDROMORPHONE HCL PF 1 MG/ML IJ SOLN
1.0000 mg | Freq: Once | INTRAMUSCULAR | Status: AC
  Administered 2013-09-09: 1 mg via INTRAVENOUS
  Filled 2013-09-09: qty 1

## 2013-09-09 MED ORDER — ONDANSETRON HCL 4 MG/2ML IJ SOLN
4.0000 mg | Freq: Once | INTRAMUSCULAR | Status: AC
  Administered 2013-09-09: 4 mg via INTRAVENOUS
  Filled 2013-09-09: qty 2

## 2013-09-09 MED ORDER — SODIUM CHLORIDE 0.9 % IV SOLN
Freq: Once | INTRAVENOUS | Status: AC
  Administered 2013-09-09: 22:00:00 via INTRAVENOUS

## 2013-09-09 NOTE — Discharge Instructions (Signed)
Pain Relief Preoperatively and Postoperatively °Being a good patient does not mean being a silent one. If you have questions, problems, or concerns about the pain you may feel after surgery, let your caregiver know. Patients have the right to assessment and management of pain. The treatment of pain after surgery is important to speed up recovery and return to normal activities. Severe pain after surgery, and the fear or anxiety associated with that pain, may cause extreme discomfort that: °· Prevents sleep. °· Decreases the ability to breathe deeply and cough. This can cause pneumonia or other upper airway infections. °· Causes your heart to beat faster and your blood pressure to be higher. °· Increases the risk for constipation and bloating. °· Decreases the ability of wounds to heal. °· May result in depression, increased anxiety, and feelings of helplessness. °Relief of pain before surgery is also important because it will lessen the pain after surgery. Patients who receive both pain relief before and after surgery experience greater pain relief than those who only receive pain relief after surgery. Let your caregiver know if you are having uncontrolled pain. This is very important. Pain after surgery is more difficult to manage if it is permitted to become severe, so prompt and adequate treatment of acute pain is necessary. °PAIN CONTROL METHODS °Your caregivers follow policies and procedures about the management of patient pain. These guidelines should be explained to you before surgery. Plans for pain control after surgery must be mutually decided upon and instituted with your full understanding and agreement. Do not be afraid to ask questions regarding the care you are receiving. There are many different ways your caregivers will attempt to control your pain, including the following methods. °As needed pain control °· You may be given pain medicine either through your intravenous (IV) tube, or as a pill or  liquid you can swallow. You will need to let your caregiver know when you are having pain. Then, your caregiver will give you the pain medicine ordered for you. °· Your pain medicine may make you constipated. If constipation occurs, drink more liquids if you can. Your caregiver may have you take a mild laxative. °IV patient-controlled analgesia pump (PCA pump) °· You can get your pain medicine through the IV tube which goes into your vein. You are able to control the amount of pain medicine that you get. The pain medicine flows in through an IV tube and is controlled by a pump. This pump gives you a set amount of pain medicine when you push the button hooked up to it. Nobody should push this button but you or someone specifically assigned by you to do so. It is set up to keep you from accidentally giving yourself too much pain medicine. You will be able to start using your pain pump in the recovery room after your surgery. This method can be helpful for most types of surgery. °· If you are still having too much pain, tell your caregiver. Also, tell your caregiver if you are feeling too sleepy or nauseous. °Continuous epidural pain control °· A thin, soft tube (catheter) is put into your back. Pain medicine flows through the catheter to lessen pain in the part of your body where the surgery is done. Continuous epidural pain control may work best for you if you are having surgery on your chest, abdomen, hip area, or legs. The epidural catheter is usually put into your back just before surgery. The catheter is left in until you can eat and take medicine by mouth. In most cases,   this may take 2 to 3 days.  Giving pain medicine through the epidural catheter may help you heal faster because:  Your bowel gets back to normal faster.  You can get back to eating sooner.  You can be up and walking sooner. Medicine that numbs the area (local anesthetic)  You may receive an injection of pain medicine near where the  pain is (local infiltration).  You may receive an injection of pain medicine near the nerve that controls the sensation to a specific part of the body (peripheral nerve block).  Medicine may be put in the spine to block pain (spinal block). Opioids  Moderate to moderately severe acute pain after surgery may respond to opioids.Opioids are narcotic pain medicine. Opioids are often combined with non-narcotic medicines to improve pain relief, diminish the risk of side effects, and reduce the chance of addiction.  If you follow your caregiver's directions about taking opioids and you do not have a history of substance abuse, your risk of becoming addicted is exceptionally small.Opioids are given for short periods of time in careful doses to prevent addiction. Other methods of pain control include:  Steroids.  Physical therapy.  Heat and cold therapy.  Compression, such as wrapping an elastic bandage around the area of pain.  Massage. These various ways of controlling pain may be used together. Combining different methods of pain control is called multimodal analgesia. Using this approach has many benefits, including being able to eat, move around, and leave the hospital sooner. Document Released: 07/25/2002 Document Revised: 07/27/2011 Document Reviewed: 07/29/2010 [facility] Patient Information 2014 [place]. You have been given a prescription for a stronger pain medication Please take as directed  Please call Dr. Tonita Cong on Monday to be seen in the office that day  Please be careful taking this additional medication as it can cause respiratory depression and somnolence.

## 2013-09-09 NOTE — ED Notes (Signed)
Bed: WTR8 Expected date: 09/09/13 Expected time:  Means of arrival:  Comments: EMS knee pain had recent surgery

## 2013-09-09 NOTE — ED Provider Notes (Signed)
Medical screening examination/treatment/procedure(s) were performed by non-physician practitioner and as supervising physician I was immediately available for consultation/collaboration.   EKG Interpretation None      Rolland Porter, MD, Abram Sander   [name], MD 09/09/13 [phone]

## 2013-09-09 NOTE — ED Provider Notes (Signed)
CSN: [id number]     Arrival date & time 09/09/13  2054 History  This chart was scribed for non-physician practitioner [name], NP working with [name], MD by Mercy Moore, ED Scribe. This patient was seen in room WTR8/WTR8 and the patient's care was started at 9:22 PM.   Chief Complaint  Patient presents with  . Knee Pain      The history is provided by the patient. No language interpreter was used.   HPI Comments: Alexis [name] is a 48 y.o. female who presents to the Emergency Department complaining of progressively worsening knee pain since this morning. Pt had arthroscopic knee surgery yesterday for a torn tendon in right knee. Pt has been taking oxycodone for her knee pain. This morning the pain became unbearable, untouched by medication which she has been taking as directed. Pt also taking oxymorphone 20 mg for chronic pain. Patient called her surgeon and was told to come to ED because she may need pain medication administered through IV.    Past Medical History  Diagnosis Date  . Hypertension   . GERD (gastroesophageal reflux disease)   . Arthritis   . Asthma   . Dyspnea   . Degenerative disk disease   . Interstitial cystitis   . Endometriosis 01/18/01  . Fibromyalgia   . DJD (degenerative joint disease) of knee   . Right knee meniscal tear   . Sickle cell trait   . History of thyroid cancer     2010--  TOTAL THYROIDECTOMY--  NO RECURRENCE  . History of thyroiditis   . Cervical spondylosis     C5-6  . OSA on CPAP   . Varicose veins   . History of colon polyps   . Hypothyroidism, postsurgical   . Lymphedema   . Glaucoma of both eyes   . Vocal cord paralysis, unilateral complete     RIGHT--  POST TOTAL THYROIDECTOMY  . Chronic colitis   . H/O hiatal hernia   . Complication of anesthesia     has awakened in past/  RIGHT VOCAL CORD PARALYSIS  POST TOTAL THYROIDECTOMY  03-08-2009 ([facility])  . Uterine fibroid   . Bilateral dry eyes    Past Surgical  History  Procedure Laterality Date  . Achilles tendon repair Left 2007  . Vein surgery    . Temporal artery biopsy / ligation  2010  . Hydradenitis excision  06/29/2011    Procedure: EXCISION HYDRADENITIS AXILLA;  Surgeon: Hermelinda Dellen, MD;  Location: [facility];  Service: Plastics;;  right breast hydradenitis excioion  . Dx laparoscopy/  pelvic bx's/  lysis adhesions  06-26-2000  . Removal perianal abscess  02-14-2001  . Temporal artery biopsy / ligation Right 06-16-2002  . Cysto/  urethral dilation /  bilateral unroofing ureterocele/  bilateral ureteral stent placement  12-01-2002  . Cysto/  bilateral retrograde pyelogram/  hydrodistention/  instillation therapy  05-08-2003  . Wide excision mass,  chest area  10-29-2003  . D & c hysteroscopy/  removal iud  12-06-2003  . Dilatation and curettage with suction  05-29-2005    RETAINED PLACENTA  . Cysto/  hydrodistention/  instillation therapy  06-04-2005  &  03-04-2007  . Total thyroidectomy  03-08-2009  . Pilonidal cyst excision  1983  . Tonsillectomy  1990  . Breast enhancement surgery Bilateral 1997  . Laparoscopy abdomen diagnostic  SEPT  2003   [place] IUD  . Left  wrist tendon release  MAY  2007  . Bunionectomy with hammertoe reconstruction and gastroc slide Left FEB  2011    REMOVAL HARDWARE  NOV  2011  . Hydradenitis excision Left 2000  . Colonoscopy with esophagogastroduodenoscopy (egd)  MULTIPLE --  LAST ONE   NOV  2014   Family History  Problem Relation Age of Onset  . Cancer Paternal Aunt     breast, colon   History  Substance Use Topics  . Smoking status: Never Smoker   . Smokeless tobacco: Never Used  . Alcohol Use: Yes     Comment: rarely   OB History   Grav Para Term Preterm Abortions TAB SAB Ect Mult Living   1 1        1      Review of Systems  Constitutional: Negative for fever.  Musculoskeletal: Positive for arthralgias.  All other systems reviewed and are  negative.     Allergies  Adhesive; Bactrim; Clarithromycin; Ibuprofen; Nexium; Nsaids; Sulfa antibiotics; and Latex  Home Medications   Prior to Admission medications   Medication Sig Start Date End Date Taking? Authorizing Provider  ALBUTEROL IN Inhale into the lungs as needed.     Historical Provider, MD  brimonidine (ALPHAGAN P) 0.1 % SOLN Place 1 drop into both eyes 3 (three) times daily.    Historical Provider, MD  budesonide-formoterol (SYMBICORT) 160-4.5 MCG/ACT inhaler Inhale 1 puff into the lungs 2 (two) times daily.    Historical Provider, MD  cetirizine (ZYRTEC) 10 MG tablet Take 10 mg by mouth daily.    Historical Provider, MD  cyclobenzaprine (FLEXERIL) 10 MG tablet Take 10 mg by mouth 3 (three) times daily as needed. For muscle spasms.    Historical Provider, MD  cycloSPORINE (RESTASIS) 0.05 % ophthalmic emulsion Place 1 drop into both eyes 2 (two) times daily.    Historical Provider, MD  dicyclomine (BENTYL) 20 MG tablet Take 20 mg by mouth 4 (four) times daily -  before meals and at bedtime.     Historical Provider, MD  furosemide (LASIX) 40 MG tablet Take 40 mg by mouth daily.    Historical Provider, MD  hydrOXYzine (ATARAX/VISTARIL) 25 MG tablet Take 25 mg by mouth 3 (three) times daily as needed.    Historical Provider, MD  Iron-FA-B Cmp-C-Biot-Probiotic (FUSION PLUS) CAPS Take 1 capsule by mouth daily.    Historical Provider, MD  ketotifen (REFRESH EYE ITCH RELIEF) 0.025 % ophthalmic solution Place 1 drop into both eyes daily.    Historical Provider, MD  levothyroxine (SYNTHROID, LEVOTHROID) 125 MCG tablet Take 125 mcg by mouth daily before breakfast.    Historical Provider, MD  Lidocaine HCl 2 % SOLN Use as directed in the mouth or throat as needed.    Historical Provider, MD  loperamide (IMODIUM) 2 MG capsule Take 1 capsule (2 mg total) by mouth 4 (four) times daily as needed for diarrhea or loose stools. 05/24/13   Varney Biles, MD  loteprednol (LOTEMAX) 0.5 %  ophthalmic suspension Place 1 drop into both eyes 4 (four) times daily.    Historical Provider, MD  NYSTATIN PO Take 5 mLs by mouth daily as needed. For scores in mouth    Historical Provider, MD  Omeprazole-Sodium Bicarbonate (ZEGERID) 20-1100 MG CAPS capsule Take 1 capsule by mouth daily before breakfast.    Historical Provider, MD  ondansetron (ZOFRAN) 4 MG tablet Take 4 mg by mouth every 8 (eight) hours as needed for nausea or vomiting. 07/25/13   Antonietta Breach,  PA-C  oxyCODONE (OXY IR/ROXICODONE) 5 MG immediate release tablet 1-2 po q4-6 prn pain 09/08/13   Johnn Hai, MD  Oxycodone HCl 20 MG TABS Take 1 tablet by mouth every 4 (four) hours.    Historical Provider, MD  oxymorphone (OPANA ER) 20 MG 12 hr tablet Take 20 mg by mouth every 12 (twelve) hours.    Historical Provider, MD  potassium chloride (K-DUR,KLOR-CON) 10 MEQ tablet Take 10 mEq by mouth daily.     Historical Provider, MD  predniSONE (DELTASONE) 5 MG tablet Take 5 mg by mouth daily with breakfast.    Historical Provider, MD  pregabalin (LYRICA) 75 MG capsule Take 75 mg by mouth daily.    Historical Provider, MD  promethazine (PHENERGAN) 25 MG tablet Take 25 mg by mouth every 6 (six) hours as needed for nausea or vomiting.    Historical Provider, MD  Vitamin D, Ergocalciferol, (DRISDOL) 50000 UNITS CAPS Take 50,000 Units by mouth every 7 (seven) days. Take on Saturdays.    Historical Provider, MD   Triage Vitals: BP 141/79  Pulse 76  Temp(Src) 98.3 F (36.8 C) (Oral)  Resp 18  Ht 5\' 2"  (1.575 m)  Wt 219 lb (99.338 kg)  BMI 40.05 kg/m2  SpO2 98%  LMP 07/09/2013 Physical Exam  Nursing note and vitals reviewed. Constitutional: She is oriented to person, place, and time. She appears well-developed and well-nourished. No distress.  HENT:  Head: Normocephalic and atraumatic.  Eyes: EOM are normal.  Neck: Neck supple. No tracheal deviation present.  Cardiovascular: Normal rate.   Pulmonary/Chest: Effort normal. No  respiratory distress.  Musculoskeletal: She exhibits tenderness.  Neurological: She is alert and oriented to person, place, and time.  Skin: Skin is warm and dry.  Psychiatric: She has a normal mood and affect. Her behavior is normal.    ED Course  Procedures (including critical care time) DIAGNOSTIC STUDIES: Oxygen Saturation is 98% on room air, normal by my interpretation.    COORDINATION OF CARE: 9:29 PM- Discussed treatment plan with patient at bedside and patient agreed to plan.    Labs Review Labs Reviewed  CBC WITH DIFFERENTIAL - Abnormal; Notable for the following:    WBC 17.4 (*)    HCT 35.4 (*)    Neutro Abs 12.8 (*)    Monocytes Absolute 1.6 (*)    All other components within normal limits  I-STAT CHEM 8, ED    Imaging Review No results found.   EKG Interpretation None      MDM  Consults with Dr. Chaney Malling, orthopedics, who suggested giving the patient.  More Dilaudid to see if I can get her more comfortable.  If this works for this patient's pain.  He is requesting that she get sent home with Dilaudid tablets 2 mg tablets 1 every 4 hours, and to follow up with Dr. Tonita Cong on Monday After 2 mg of Dilaudid IV.  Patient has multiple comfortable.  I discussed the additional pain medication per Dr. Gilberto Better request.  Cautioned her on Respiratory depression.  She, states, that she understands, that she's been taking narcotic pain medication for years Final diagnoses:  Post-operative pain      I personally performed the services described in this documentation, which was scribed in my presence. The recorded information has been reviewed and is accurate.   Garald Balding, NP 09/09/13 [phone]

## 2013-09-09 NOTE — Anesthesia Postprocedure Evaluation (Signed)
  Anesthesia Post-op Note  Patient: Alexis [name]  Procedure(s) Performed: Procedure(s) (LRB): RIGHT KNEE ARTHROSCOPY PARTIAL LATERAL MENISCECTOMY AND DEBRIDEMENT  (Right)  Patient Location: PACU  Anesthesia Type: General  Level of Consciousness: awake and alert   Airway and Oxygen Therapy: Patient Spontanous Breathing  Post-op Pain: mild  Post-op Assessment: Post-op Vital signs reviewed, Patient's Cardiovascular Status Stable, Respiratory Function Stable, Patent Airway and No signs of Nausea or vomiting  Last Vitals:  Filed Vitals:   09/08/13 1640  BP: 161/91  Pulse: 78  Temp: 36.7 C  Resp: 16    Post-op Vital Signs: stable   Complications: No apparent anesthesia complications

## 2013-09-09 NOTE — ED Notes (Signed)
Per PTAR: Pt had arthroscopic knee surgery yesterday for torn tendon in R knee. Pt has been taking oxycodone for pain. This morning the pain became unbearable, untouched by medication. Pt also on oxymorphone 20 mg for chronic pain.

## 2013-09-09 NOTE — ED Notes (Signed)
Last dose of pain medicine 5:15pm

## 2013-09-11 ENCOUNTER — Encounter (HOSPITAL_BASED_OUTPATIENT_CLINIC_OR_DEPARTMENT_OTHER): Payer: Self-pay | Admitting: Specialist

## 2013-09-11 NOTE — Op Note (Signed)
[name], [name] NO.:  [id number]  MEDICAL RECORD NO.:  [id number]  LOCATION:                                 FACILITY:  PHYSICIAN:  Susa Day, M.D.    DATE OF BIRTH:  November 30, 1965  DATE OF PROCEDURE:  09/08/2013 DATE OF DISCHARGE:                              OPERATIVE REPORT   PREOPERATIVE DIAGNOSES:  Lateral meniscus tear, severe degenerative joint disease, right knee.  POSTOPERATIVE DIAGNOSES:  Lateral meniscus tear, severe degenerative joint disease, right knee.  PROCEDURE PERFORMED: 1. Knee arthroscopy. 2. Partial lateral meniscectomy. 3. Chondroplasty of the patellofemoral sulcus. 4. Removal of loose bodies and excision of chondral flap tears.  BRIEF HISTORY:  This is a 48 year old female who has had pain, mechanical symptoms of the knee, MRI indicating lateral meniscus tear, severe patellofemoral DJD.  She has been refractory to conservative treatment.  Due to the mechanical symptoms, discussed arthroscopic lateral meniscectomy and debridement assessing the tracking of the patella.  Risks and benefits discussed including bleeding, infection, damage to neurovascular structures, DVT, PE, anesthetic complications, no change in her symptoms, worsening of symptoms, need for repeat debridement, or patellofemoral arthroplasty, etc.  TECHNIQUE:  With the patient in supine position after the induction of adequate general anesthesia, 2 g Kefzol, the right lower extremity was prepped and draped in usual sterile fashion.  A lateral parapatellar portal was fashioned with a #11 blade.  Ingress cannula atraumatically placed.  Irrigant was utilized to insufflate the joint at 65 mmHg. Under direct visualization, a medial parapatellar portal was fashioned with a #11 blade after localization with 18-gauge needle just medial to the lateral border of the patellar tendon.  There was abundant fat pad there and therefore removed it approximately a centimeter and  a half. Medial to that, localized it again fashioned with a #11 blade under direct visualization sparing the medial meniscus, dilated with a trocar. We then inspected the medial compartment.  Minor grade 2 changes in the meniscus.  There was minor fraying without evidence of tearing.  It was stable to probe palpation.  The entire chondral surfaces were without significant defect.  ACL was unremarkable.  There was tearing of the mid portion to the lateral portion of the lateral meniscus.  This was shaved with 3.5 Cuda shaver to a stable base.  The remnant was stable to probe palpation posteriorly and anteriorly.  So, we checked on top of the meniscus and below, no residual tearing noted and it was stable.  We inspected the chondral surfaces, they were unremarkable until we got into the region of the femoral sulcus.  Therefore, we went into the suprapatellar pouch and revealed extensive grade 3 and grade 4 changes of the patella, loose cartilaginous debris, grade 3 changes.  There was actually normal patellofemoral tracking.  There is severe chondral loss of the sulcus as well extending into both upper condyles.  Medial aspect of that, introduced a shaver and performed light chondroplasty of the patella. The end of the femoral sulcus excising chondral flap tears and evacuated loose bodies.  She had good patellofemoral tracking.  Gutters were unremarkable.  After full chondroplasty, we lavaged the knee, revisited all compartments.  No further pathology amenable to arthroscopic intervention.  I, therefore, removed all instrumentation.  Portals were closed with 4-0 nylon simple sutures.  0.25% Marcaine with epinephrine was infiltrated into the joint.  Wound was dressed sterilely.  Awoken without difficulty and transported to the recovery room in satisfactory condition.  The patient tolerated the procedure well.  No complications.  No assistant.  Minimal blood loss.     Susa Day,  M.D.     Geralynn Rile  D:  09/08/2013  T:  09/09/2013  Job:  [id number]

## 2013-10-24 ENCOUNTER — Emergency Department (HOSPITAL_COMMUNITY)
Admission: EM | Admit: 2013-10-24 | Discharge: 2013-10-24 | Disposition: A | Discharge status: Home / Self Care | Payer: Medicaid Other | Attending: Emergency Medicine | Admitting: Emergency Medicine

## 2013-10-24 ENCOUNTER — Encounter (HOSPITAL_COMMUNITY): Payer: Self-pay | Admitting: Emergency Medicine

## 2013-10-24 DIAGNOSIS — H409 Unspecified glaucoma: Secondary | ICD-10-CM | POA: Insufficient documentation

## 2013-10-24 DIAGNOSIS — Z860100 Personal history of colon polyps, unspecified: Secondary | ICD-10-CM | POA: Insufficient documentation

## 2013-10-24 DIAGNOSIS — M129 Arthropathy, unspecified: Secondary | ICD-10-CM | POA: Insufficient documentation

## 2013-10-24 DIAGNOSIS — G4733 Obstructive sleep apnea (adult) (pediatric): Secondary | ICD-10-CM | POA: Insufficient documentation

## 2013-10-24 DIAGNOSIS — Z87828 Personal history of other (healed) physical injury and trauma: Secondary | ICD-10-CM | POA: Insufficient documentation

## 2013-10-24 DIAGNOSIS — K648 Other hemorrhoids: Secondary | ICD-10-CM

## 2013-10-24 DIAGNOSIS — J45909 Unspecified asthma, uncomplicated: Secondary | ICD-10-CM | POA: Insufficient documentation

## 2013-10-24 DIAGNOSIS — K219 Gastro-esophageal reflux disease without esophagitis: Secondary | ICD-10-CM | POA: Insufficient documentation

## 2013-10-24 DIAGNOSIS — Z85850 Personal history of malignant neoplasm of thyroid: Secondary | ICD-10-CM | POA: Insufficient documentation

## 2013-10-24 DIAGNOSIS — IMO0002 Reserved for concepts with insufficient information to code with codable children: Secondary | ICD-10-CM | POA: Insufficient documentation

## 2013-10-24 DIAGNOSIS — Z8739 Personal history of other diseases of the musculoskeletal system and connective tissue: Secondary | ICD-10-CM | POA: Insufficient documentation

## 2013-10-24 DIAGNOSIS — I10 Essential (primary) hypertension: Secondary | ICD-10-CM | POA: Insufficient documentation

## 2013-10-24 DIAGNOSIS — Z9889 Other specified postprocedural states: Secondary | ICD-10-CM | POA: Insufficient documentation

## 2013-10-24 DIAGNOSIS — Z8742 Personal history of other diseases of the female genital tract: Secondary | ICD-10-CM | POA: Insufficient documentation

## 2013-10-24 DIAGNOSIS — Z79899 Other long term (current) drug therapy: Secondary | ICD-10-CM | POA: Insufficient documentation

## 2013-10-24 DIAGNOSIS — K625 Hemorrhage of anus and rectum: Secondary | ICD-10-CM

## 2013-10-24 DIAGNOSIS — Z91040 Latex allergy status: Secondary | ICD-10-CM | POA: Insufficient documentation

## 2013-10-24 DIAGNOSIS — Z862 Personal history of diseases of the blood and blood-forming organs and certain disorders involving the immune mechanism: Secondary | ICD-10-CM | POA: Insufficient documentation

## 2013-10-24 DIAGNOSIS — K921 Melena: Secondary | ICD-10-CM | POA: Insufficient documentation

## 2013-10-24 LAB — CBC WITH DIFFERENTIAL/PLATELET
Basophils Absolute: 0 10*3/uL (ref 0.0–0.1)
Basophils Relative: 0 % (ref 0–1)
Eosinophils Absolute: 0.2 10*3/uL (ref 0.0–0.7)
Eosinophils Relative: 2 % (ref 0–5)
HCT: 36.7 % (ref 36.0–46.0)
Hemoglobin: 12.4 g/dL (ref 12.0–15.0)
Lymphocytes Relative: 23 % (ref 12–46)
Lymphs Abs: 2.8 10*3/uL (ref 0.7–4.0)
MCH: 29.2 pg (ref 26.0–34.0)
MCHC: 33.8 g/dL (ref 30.0–36.0)
MCV: 86.6 fL (ref 78.0–100.0)
Monocytes Absolute: 1.1 10*3/uL — ABNORMAL HIGH (ref 0.1–1.0)
Monocytes Relative: 9 % (ref 3–12)
Neutro Abs: 8 10*3/uL — ABNORMAL HIGH (ref 1.7–7.7)
Neutrophils Relative %: 66 % (ref 43–77)
Platelets: 329 10*3/uL (ref 150–400)
RBC: 4.24 MIL/uL (ref 3.87–5.11)
RDW: 14.5 % (ref 11.5–15.5)
WBC: 12.1 10*3/uL — ABNORMAL HIGH (ref 4.0–10.5)

## 2013-10-24 LAB — COMPREHENSIVE METABOLIC PANEL
ALT: 9 U/L (ref 0–35)
AST: 11 U/L (ref 0–37)
Albumin: 4 g/dL (ref 3.5–5.2)
Alkaline Phosphatase: 73 U/L (ref 39–117)
BUN: 7 mg/dL (ref 6–23)
CO2: 31 mEq/L (ref 19–32)
Calcium: 9.3 mg/dL (ref 8.4–10.5)
Chloride: 102 mEq/L (ref 96–112)
Creatinine, Ser: 0.72 mg/dL (ref 0.50–1.10)
GFR calc Af Amer: >90 mL/min (ref >90)
GFR calc non Af Amer: >90 mL/min (ref >90)
Glucose, Bld: 93 mg/dL (ref 70–99)
Potassium: 3.9 mEq/L (ref 3.7–5.3)
Sodium: 143 mEq/L (ref 137–147)
Total Bilirubin: 0.3 mg/dL (ref 0.3–1.2)
Total Protein: 8 g/dL (ref 6.0–8.3)

## 2013-10-24 LAB — POC OCCULT BLOOD, ED: Fecal Occult Bld: POSITIVE — AB

## 2013-10-24 LAB — LIPASE, BLOOD: Lipase: 21 U/L (ref 11–59)

## 2013-10-24 NOTE — ED Notes (Signed)
Pt A&OX4, ambulatory at d/c with steady gait, NAD. Pt declined wheelchair

## 2013-10-24 NOTE — Discharge Instructions (Signed)
Rectal Bleeding Rectal bleeding is when blood passes out of the anus. It is usually a sign that something is wrong. It may not be serious, but it should always be evaluated. Rectal bleeding may present as bright red blood or extremely dark stools. The color may range from dark red or maroon to black (like tar). It is important that the cause of rectal bleeding be identified so treatment can be started and the problem corrected. CAUSES   Hemorrhoids. These are enlarged (dilated) blood vessels or veins in the anal or rectal area.  Fistulas. Theseare abnormal, burrowing channels that usually run from inside the rectum to the skin around the anus. They can bleed.  Anal fissures. This is a tear in the tissue of the anus. Bleeding occurs with bowel movements.  Diverticulosis. This is a condition in which pockets or sacs project from the bowel wall. Occasionally, the sacs can bleed.  Diverticulitis. Thisis an infection involving diverticulosis of the colon.  Proctitis and colitis. These are conditions in which the rectum, colon, or both, can become inflamed and pitted (ulcerated).  Polyps and cancer. Polyps are non-cancerous (benign) growths in the colon that may bleed. Certain types of polyps turn into cancer.  Protrusion of the rectum. Part of the rectum can project from the anus and bleed.  Certain medicines.  Intestinal infections.  Blood vessel abnormalities. HOME CARE INSTRUCTIONS  Eat a high-fiber diet to keep your stool soft.  Limit activity.  Drink enough fluids to keep your urine clear or pale yellow.  Warm baths may be useful to soothe rectal pain.  Follow up with your caregiver as directed. SEEK IMMEDIATE MEDICAL CARE IF:  You develop increased bleeding.  You have black or dark red stools.  You vomit blood or material that looks like coffee grounds.  You have abdominal pain or tenderness.  You have a fever.  You feel weak, nauseous, or you faint.  You have  severe rectal pain or you are unable to have a bowel movement. MAKE SURE YOU:  Understand these instructions.  Will watch your condition.  Will get help right away if you are not doing well or get worse. Document Released: 10/24/2001 Document Revised: 07/27/2011 Document Reviewed: 10/19/2010 [employer] Patient Information 2014 [employer].  

## 2013-10-24 NOTE — ED Notes (Signed)
Pt presents with intermittent episodes of bloody stool starting on Sunday, pt also reports pressure to her lower abd with increase pain to RLQ. Pt also states her elevated BP since Sunday

## 2013-10-24 NOTE — ED Notes (Signed)
Dr. Walden at bedside 

## 2013-10-25 NOTE — ED Provider Notes (Signed)
CSN: [id number]     Arrival date & time 10/24/13  1650 History   First MD Initiated Contact with Patient 10/24/13 1940     Chief Complaint  Patient presents with  . Rectal Bleeding     (Consider location/radiation/quality/duration/timing/severity/associated sxs/prior Treatment) Patient is a 48 y.o. female presenting with hematochezia. The history is provided by the patient.  Rectal Bleeding Quality:  Bright red Amount:  Moderate Timing:  Intermittent Progression:  Unchanged Chronicity:  Recurrent Context: defecation and hemorrhoids (hx of internal)   Context: not constipation   Similar prior episodes: yes   Relieved by:  Nothing Worsened by:  Nothing tried Associated symptoms: dizziness (occasional with standing)   Associated symptoms: no fever and no vomiting   Associated symptoms comment:  No pain with defecation, no rectal pain    Past Medical History  Diagnosis Date  . Hypertension   . GERD (gastroesophageal reflux disease)   . Arthritis   . Asthma   . Dyspnea   . Degenerative disk disease   . Interstitial cystitis   . Endometriosis 01/18/01  . Fibromyalgia   . DJD (degenerative joint disease) of knee   . Right knee meniscal tear   . Sickle cell trait   . History of thyroid cancer     2010--  TOTAL THYROIDECTOMY--  NO RECURRENCE  . History of thyroiditis   . Cervical spondylosis     C5-6  . OSA on CPAP   . Varicose veins   . History of colon polyps   . Hypothyroidism, postsurgical   . Lymphedema   . Glaucoma of both eyes   . Vocal cord paralysis, unilateral complete     RIGHT--  POST TOTAL THYROIDECTOMY  . Chronic colitis   . H/O hiatal hernia   . Complication of anesthesia     has awakened in past/  RIGHT VOCAL CORD PARALYSIS  POST TOTAL THYROIDECTOMY  03-08-2009 ([facility])  . Uterine fibroid   . Bilateral dry eyes    Past Surgical History  Procedure Laterality Date  . Achilles tendon repair Left 2007  . Vein surgery    . Temporal artery biopsy  / ligation  2010  . Hydradenitis excision  06/29/2011    Procedure: EXCISION HYDRADENITIS AXILLA;  Surgeon: Hermelinda Dellen, MD;  Location: [place];  Service: Plastics;;  right breast hydradenitis excioion  . Dx laparoscopy/  pelvic bx's/  lysis adhesions  06-26-2000  . Removal perianal abscess  02-14-2001  . Temporal artery biopsy / ligation Right 06-16-2002  . Cysto/  urethral dilation /  bilateral unroofing ureterocele/  bilateral ureteral stent placement  12-01-2002  . Cysto/  bilateral retrograde pyelogram/  hydrodistention/  instillation therapy  05-08-2003  . Wide excision mass,  chest area  10-29-2003  . D & c hysteroscopy/  removal iud  12-06-2003  . Dilatation and curettage with suction  05-29-2005    RETAINED PLACENTA  . Cysto/  hydrodistention/  instillation therapy  06-04-2005  &  03-04-2007  . Total thyroidectomy  03-08-2009  . Pilonidal cyst excision  1983  . Tonsillectomy  1990  . Breast enhancement surgery Bilateral 1997  . Laparoscopy abdomen diagnostic  SEPT  2003   [facility]  . Left wrist tendon release  MAY  2007  . Bunionectomy with hammertoe reconstruction and gastroc slide Left FEB  2011    REMOVAL HARDWARE  NOV  2011  . Hydradenitis excision Left 2000  .  Colonoscopy with esophagogastroduodenoscopy (egd)  MULTIPLE --  LAST ONE   NOV  2014  . Knee arthroscopy with lateral menisectomy Right 09/08/2013    Procedure: RIGHT KNEE ARTHROSCOPY PARTIAL LATERAL MENISCECTOMY AND DEBRIDEMENT ;  Surgeon: Johnn Hai, MD;  Location: [place];  Service: Orthopedics;  Laterality: Right;   Family History  Problem Relation Age of Onset  . Cancer Paternal Aunt     breast, colon   History  Substance Use Topics  . Smoking status: Never Smoker   . Smokeless tobacco: Never Used  . Alcohol Use: Yes     Comment: rarely   OB History   Grav Para Term Preterm Abortions TAB SAB Ect Mult Living   1 1        1       Review of Systems  Constitutional: Negative for fever.  Respiratory: Negative for cough and shortness of breath.   Cardiovascular: Negative for chest pain and leg swelling.  Gastrointestinal: Positive for hematochezia. Negative for vomiting.  Neurological: Positive for dizziness (occasional with standing).  All other systems reviewed and are negative.     Allergies  Adhesive; Bactrim; Clarithromycin; Ibuprofen; Nexium; Nsaids; Sulfa antibiotics; and Latex  Home Medications   Prior to Admission medications   Medication Sig Start Date End Date Taking? Authorizing Provider  albuterol (PROVENTIL HFA;VENTOLIN HFA) 108 (90 BASE) MCG/ACT inhaler Inhale 2 puffs into the lungs every 4 (four) hours.    Yes Historical Provider, MD  albuterol (PROVENTIL) (2.5 MG/3ML) 0.083% nebulizer solution Take 2.5 mg by nebulization every 6 (six) hours as needed for wheezing or shortness of breath.   Yes Historical Provider, MD  brimonidine (ALPHAGAN P) 0.1 % SOLN Place 1 drop into both eyes 3 (three) times daily.   Yes Historical Provider, MD  budesonide-formoterol (SYMBICORT) 160-4.5 MCG/ACT inhaler Inhale 1 puff into the lungs 2 (two) times daily.   Yes Historical Provider, MD  cetirizine (ZYRTEC) 10 MG tablet Take 10 mg by mouth daily.   Yes Historical Provider, MD  cyclobenzaprine (FLEXERIL) 10 MG tablet Take 10 mg by mouth 3 (three) times daily as needed. For muscle spasms.   Yes Historical Provider, MD  cycloSPORINE (RESTASIS) 0.05 % ophthalmic emulsion Place 1 drop into both eyes 2 (two) times daily.   Yes Historical Provider, MD  dicyclomine (BENTYL) 20 MG tablet Take 20 mg by mouth 3 (three) times daily.    Yes Historical Provider, MD  furosemide (LASIX) 40 MG tablet Take 40 mg by mouth daily.   Yes Historical Provider, MD  HYDROmorphone (DILAUDID) 2 MG tablet Take 1 tablet (2 mg total) by mouth every 4 (four) hours as needed for severe pain. 09/09/13  Yes Garald Balding, NP  hydrOXYzine  (ATARAX/VISTARIL) 25 MG tablet Take 25 mg by mouth 3 (three) times daily as needed for anxiety or itching.    Yes Historical Provider, MD  Iron-FA-B Cmp-C-Biot-Probiotic (FUSION PLUS) CAPS Take 1 capsule by mouth daily.   Yes Historical Provider, MD  ketotifen (REFRESH EYE ITCH RELIEF) 0.025 % ophthalmic solution Place 1 drop into both eyes daily as needed (dry eyes).    Yes Historical Provider, MD  levothyroxine (SYNTHROID, LEVOTHROID) 125 MCG tablet Take 125 mcg by mouth daily before breakfast.   Yes Historical Provider, MD  loperamide (IMODIUM) 2 MG capsule Take 1 capsule (2 mg total) by mouth 4 (four) times daily as needed for diarrhea or loose stools. 05/24/13  Yes Varney Biles, MD  Omeprazole-Sodium Bicarbonate (ZEGERID) 20-1100 MG  CAPS capsule Take 1 capsule by mouth daily before breakfast.   Yes Historical Provider, MD  ondansetron (ZOFRAN) 4 MG tablet Take 4 mg by mouth every 8 (eight) hours as needed for nausea or vomiting. 07/25/13  Yes Antonietta Breach, PA-C  Oxycodone HCl 20 MG TABS Take 20 mg by mouth every 4 (four) hours.    Yes Historical Provider, MD  oxymorphone (OPANA ER) 20 MG 12 hr tablet Take 20 mg by mouth every 12 (twelve) hours.   Yes Historical Provider, MD  potassium chloride (K-DUR,KLOR-CON) 10 MEQ tablet Take 10 mEq by mouth daily.    Yes Historical Provider, MD  predniSONE (DELTASONE) 5 MG tablet Take 5 mg by mouth daily with breakfast.   Yes Historical Provider, MD  pregabalin (LYRICA) 75 MG capsule Take 75 mg by mouth daily.   Yes Historical Provider, MD  promethazine (PHENERGAN) 25 MG tablet Take 25 mg by mouth every 6 (six) hours as needed for nausea or vomiting.   Yes Historical Provider, MD  Vitamin D, Ergocalciferol, (DRISDOL) 50000 UNITS CAPS Take 50,000 Units by mouth every 7 (seven) days. Take on Saturdays.   Yes Historical Provider, MD   BP 112/79  Pulse 83  Temp(Src) 98.4 F (36.9 C) (Oral)  Resp 18  Ht 5\' 3"  (1.6 m)  Wt 221 lb (100.245 kg)  BMI 39.16 kg/m2   SpO2 97% Physical Exam  Nursing note and vitals reviewed. Constitutional: She is oriented to person, place, and time. She appears well-developed and well-nourished. No distress.  HENT:  Head: Normocephalic and atraumatic.  Eyes: EOM are normal. Pupils are equal, round, and reactive to light.  Neck: Normal range of motion. Neck supple.  Cardiovascular: Normal rate and regular rhythm.  Exam reveals no friction rub.   No murmur heard. Pulmonary/Chest: Effort normal and breath sounds normal. No respiratory distress. She has no wheezes. She has no rales.  Abdominal: Soft. She exhibits no distension. There is tenderness (mild suprapubic. No RLQ, no LLQ). There is no rebound.  Genitourinary: Rectal exam shows no external hemorrhoid, no mass and no tenderness. Guaiac positive stool (positive, trace gross blood on exam).  Musculoskeletal: Normal range of motion. She exhibits no edema.  Neurological: She is alert and oriented to person, place, and time.  Skin: She is not diaphoretic.    ED Course  Procedures (including critical care time) Labs Review Labs Reviewed  CBC WITH DIFFERENTIAL - Abnormal; Notable for the following:    WBC 12.1 (*)    Neutro Abs 8.0 (*)    Monocytes Absolute 1.1 (*)    All other components within normal limits  POC OCCULT BLOOD, ED - Abnormal; Notable for the following:    Fecal Occult Bld POSITIVE (*)    All other components within normal limits  COMPREHENSIVE METABOLIC PANEL  LIPASE, BLOOD    Imaging Review No results found.   EKG Interpretation None      MDM   Final diagnoses:  Internal hemorrhoid  Rectal bleeding    64F here with intermittent bloody stool for past 3 days. Sometimes only blood with come from rectum. Occasional mild abdominal pain, none persistent. No fevers. Hx of colitis per her, records indicate possible ischemic colitis. Patient states, "I think this is my internal hemorrhoids." Came to ED b/c couldn't get into GI clinic soon.  Has f/u in 12 days with them. States occasional dizziness upon standing, not consistent. No CP, SOB.  Here vitals ok. Normal orthostatics. Mild suprapubic pain, and trace gross  blood on rectal. Hgb stable. I spoke with Dr. Cristina Gong, her GI doctor, who states likely internal hemorrhoids since painless bleeding. Can arrange her f/u in next 1-2 days. Stable for discharge.    Osvaldo Shipper, MD 10/25/13 1324

## 2013-11-24 ENCOUNTER — Other Ambulatory Visit (HOSPITAL_COMMUNITY): Payer: Self-pay | Admitting: Endocrinology

## 2013-11-24 DIAGNOSIS — C73 Malignant neoplasm of thyroid gland: Secondary | ICD-10-CM

## 2013-12-11 ENCOUNTER — Encounter (HOSPITAL_COMMUNITY)
Admission: RE | Admit: 2013-12-11 | Discharge: 2013-12-11 | Disposition: A | Discharge status: Home / Self Care | Payer: Medicaid Other | Source: Ambulatory Visit | Attending: Endocrinology | Admitting: Endocrinology

## 2013-12-11 DIAGNOSIS — C73 Malignant neoplasm of thyroid gland: Secondary | ICD-10-CM | POA: Diagnosis present

## 2013-12-11 MED ORDER — THYROTROPIN ALFA 1.1 MG IM SOLR
0.9000 mg | INTRAMUSCULAR | Status: AC
  Administered 2013-12-11: 0.9 mg via INTRAMUSCULAR

## 2013-12-12 ENCOUNTER — Encounter (HOSPITAL_COMMUNITY)
Admission: RE | Admit: 2013-12-12 | Discharge: 2013-12-12 | Disposition: A | Discharge status: Home / Self Care | Payer: Medicaid Other | Source: Ambulatory Visit | Attending: Endocrinology | Admitting: Endocrinology

## 2013-12-12 DIAGNOSIS — C73 Malignant neoplasm of thyroid gland: Secondary | ICD-10-CM | POA: Diagnosis not present

## 2013-12-12 MED ORDER — THYROTROPIN ALFA 1.1 MG IM SOLR
0.9000 mg | INTRAMUSCULAR | Status: AC
  Administered 2013-12-12: 0.9 mg via INTRAMUSCULAR

## 2013-12-13 ENCOUNTER — Encounter (HOSPITAL_COMMUNITY): Admission: RE | Admit: 2013-12-13 | Payer: Medicaid Other | Source: Ambulatory Visit

## 2013-12-13 DIAGNOSIS — C73 Malignant neoplasm of thyroid gland: Secondary | ICD-10-CM | POA: Diagnosis not present

## 2013-12-13 LAB — HCG, SERUM, QUALITATIVE: Preg, Serum: NEGATIVE

## 2013-12-15 ENCOUNTER — Encounter (HOSPITAL_COMMUNITY)
Admission: RE | Admit: 2013-12-15 | Discharge: 2013-12-15 | Disposition: A | Discharge status: Home / Self Care | Payer: Medicaid Other | Source: Ambulatory Visit | Attending: Endocrinology | Admitting: Endocrinology

## 2013-12-15 DIAGNOSIS — C73 Malignant neoplasm of thyroid gland: Secondary | ICD-10-CM | POA: Insufficient documentation

## 2013-12-15 MED ORDER — SODIUM IODIDE I 131 CAPSULE
4.0000 | Freq: Once | INTRAVENOUS | Status: AC | PRN
  Administered 2013-12-15: 4 via ORAL

## 2013-12-18 LAB — THYROGLOBULIN LEVEL: Thyroglobulin: <0.2 ng/mL (ref 0.0–55.0)

## 2013-12-18 LAB — THYROGLOBULIN ANTIBODY: Thyroglobulin Ab: <20 IU/mL (ref <40.0)

## 2014-03-16 ENCOUNTER — Emergency Department (HOSPITAL_COMMUNITY)
Admission: EM | Admit: 2014-03-16 | Discharge: 2014-03-16 | Disposition: A | Discharge status: Home / Self Care | Payer: Medicaid Other | Attending: Emergency Medicine | Admitting: Emergency Medicine

## 2014-03-16 ENCOUNTER — Encounter (HOSPITAL_COMMUNITY): Payer: Self-pay | Admitting: Emergency Medicine

## 2014-03-16 DIAGNOSIS — Z8742 Personal history of other diseases of the female genital tract: Secondary | ICD-10-CM | POA: Diagnosis not present

## 2014-03-16 DIAGNOSIS — Z85850 Personal history of malignant neoplasm of thyroid: Secondary | ICD-10-CM | POA: Diagnosis not present

## 2014-03-16 DIAGNOSIS — E890 Postprocedural hypothyroidism: Secondary | ICD-10-CM | POA: Insufficient documentation

## 2014-03-16 DIAGNOSIS — Z7952 Long term (current) use of systemic steroids: Secondary | ICD-10-CM | POA: Insufficient documentation

## 2014-03-16 DIAGNOSIS — K219 Gastro-esophageal reflux disease without esophagitis: Secondary | ICD-10-CM | POA: Insufficient documentation

## 2014-03-16 DIAGNOSIS — D573 Sickle-cell trait: Secondary | ICD-10-CM | POA: Diagnosis not present

## 2014-03-16 DIAGNOSIS — N61 Inflammatory disorders of breast: Secondary | ICD-10-CM | POA: Insufficient documentation

## 2014-03-16 DIAGNOSIS — G4733 Obstructive sleep apnea (adult) (pediatric): Secondary | ICD-10-CM | POA: Diagnosis not present

## 2014-03-16 DIAGNOSIS — Z86010 Personal history of colonic polyps: Secondary | ICD-10-CM | POA: Insufficient documentation

## 2014-03-16 DIAGNOSIS — Z8739 Personal history of other diseases of the musculoskeletal system and connective tissue: Secondary | ICD-10-CM | POA: Diagnosis not present

## 2014-03-16 DIAGNOSIS — J45909 Unspecified asthma, uncomplicated: Secondary | ICD-10-CM | POA: Insufficient documentation

## 2014-03-16 DIAGNOSIS — Z91040 Latex allergy status: Secondary | ICD-10-CM | POA: Diagnosis not present

## 2014-03-16 DIAGNOSIS — I10 Essential (primary) hypertension: Secondary | ICD-10-CM | POA: Diagnosis not present

## 2014-03-16 DIAGNOSIS — Z79899 Other long term (current) drug therapy: Secondary | ICD-10-CM | POA: Diagnosis not present

## 2014-03-16 DIAGNOSIS — H409 Unspecified glaucoma: Secondary | ICD-10-CM | POA: Diagnosis not present

## 2014-03-16 DIAGNOSIS — N644 Mastodynia: Secondary | ICD-10-CM | POA: Diagnosis present

## 2014-03-16 MED ORDER — CEPHALEXIN 500 MG PO CAPS: 500.0000 mg | ORAL_CAPSULE | Freq: Four times a day (QID) | ORAL | Status: DC

## 2014-03-16 MED ORDER — FLUCONAZOLE 150 MG PO TABS: 150.0000 mg | ORAL_TABLET | Freq: Once | ORAL | Status: DC

## 2014-03-16 NOTE — ED Notes (Signed)
Went camping 10/17,18,19. Noticed blister-type lesion on upper abd and one on right breast. Saw her PCP on 10/20 who drained the blister on her breast, gave her Doxycyline and Silvadine. The breast lesion has gotten progressively worse. Large reddened area around open lesion which is draining clear fluid.

## 2014-03-16 NOTE — Discharge Instructions (Signed)

## 2014-03-16 NOTE — ED Provider Notes (Signed)
Medical screening examination/treatment/procedure(s) were performed by non-physician practitioner and as supervising physician I was immediately available for consultation/collaboration.  Richarda Blade, MD 03/16/14 [phone]

## 2014-03-16 NOTE — ED Provider Notes (Signed)
CSN: [id number]     Arrival date & time 03/16/14  1017 History  This chart was scribed for non-physician practitioner, Lorre Munroe, PA-C,working with Richarda Blade, MD, by Marlowe Kays, ED Scribe. This patient was seen in room TR05C/TR05C and the patient's care was started at 10:35 AM.  No chief complaint on file.  The history is provided by the patient. No language interpreter was used.   HPI Comments:  Alexis [name] is a 48 y.o. obese female with PMH of HTN, DDD, fibromyalgia and thyroid cancer who presents to the Emergency Department complaining of blisers to her mid abdomen and lateral right breast that began ten days ago while camping. Alexis [name] states Alexis [name] was seen by her PCP and the wound was cultured. Alexis [name] was prescribed Doxycycline and Silvadene cream in which Alexis [name] has been taking and applying as directed. Alexis [name] reports some red streaking and moderate aching pain in surrounding areas of the chest wall and breast. Pt states the blister on her breast has gotten worse and is now malodorous. Lying on the area makes the pain worse. Alexis [name] does not report any alleviating factors. Denies fever, chills, nausea, vomiting, CP and SOB. Pt reports allergies to Bactrim and Biaxin.   Past Medical History  Diagnosis Date  . Hypertension   . GERD (gastroesophageal reflux disease)   . Arthritis   . Asthma   . Dyspnea   . Degenerative disk disease   . Interstitial cystitis   . Endometriosis 01/18/01  . Fibromyalgia   . DJD (degenerative joint disease) of knee   . Right knee meniscal tear   . Sickle cell trait   . History of thyroid cancer     2010--  TOTAL THYROIDECTOMY--  NO RECURRENCE  . History of thyroiditis   . Cervical spondylosis     C5-6  . OSA on CPAP   . Varicose veins   . History of colon polyps   . Hypothyroidism, postsurgical   . Lymphedema   . Glaucoma of both eyes   . Vocal cord paralysis, unilateral complete     RIGHT--  POST TOTAL THYROIDECTOMY  . Chronic colitis   . H/O  hiatal hernia   . Complication of anesthesia     has awakened in past/  RIGHT VOCAL CORD PARALYSIS  POST TOTAL THYROIDECTOMY  03-08-2009 ([facility])  . Uterine fibroid   . Bilateral dry eyes    Past Surgical History  Procedure Laterality Date  . Achilles tendon repair Left 2007  . Vein surgery    . Temporal artery biopsy / ligation  2010  . Hydradenitis excision  06/29/2011    Procedure: EXCISION HYDRADENITIS AXILLA;  Surgeon: Hermelinda Dellen, MD;  Location: [place];  Service: Plastics;;  right breast hydradenitis excioion  . Dx laparoscopy/  pelvic bx's/  lysis adhesions  06-26-2000  . Removal perianal abscess  02-14-2001  . Temporal artery biopsy / ligation Right 06-16-2002  . Cysto/  urethral dilation /  bilateral unroofing ureterocele/  bilateral ureteral stent placement  12-01-2002  . Cysto/  bilateral retrograde pyelogram/  hydrodistention/  instillation therapy  05-08-2003  . Wide excision mass,  chest area  10-29-2003  . D & c hysteroscopy/  removal iud  12-06-2003  . Dilatation and curettage with suction  05-29-2005    RETAINED PLACENTA  . Cysto/  hydrodistention/  instillation therapy  06-04-2005  &  03-04-2007  . Total thyroidectomy  03-08-2009  . Pilonidal cyst excision  1983  .  Tonsillectomy  1990  . Breast enhancement surgery Bilateral 1997  . Laparoscopy abdomen diagnostic  SEPT  2003   [place] [facility]  . Left wrist tendon release  MAY  2007  . Bunionectomy with hammertoe reconstruction and gastroc slide Left FEB  2011    REMOVAL HARDWARE  NOV  2011  . Hydradenitis excision Left 2000  . Colonoscopy with esophagogastroduodenoscopy (egd)  MULTIPLE --  LAST ONE   NOV  2014  . Knee arthroscopy with lateral menisectomy Right 09/08/2013    Procedure: RIGHT KNEE ARTHROSCOPY PARTIAL LATERAL MENISCECTOMY AND DEBRIDEMENT ;  Surgeon: Johnn Hai, MD;  Location: [place];  Service: Orthopedics;  Laterality: Right;    Family History  Problem Relation Age of Onset  . Cancer Paternal Aunt     breast, colon   History  Substance Use Topics  . Smoking status: Never Smoker   . Smokeless tobacco: Never Used  . Alcohol Use: Yes     Comment: rarely   OB History   Grav Para Term Preterm Abortions TAB SAB Ect Mult Living   1 1        1      Review of Systems  Constitutional: Negative for fever and chills.  Respiratory: Negative for shortness of breath.   Cardiovascular: Negative for chest pain.  Gastrointestinal: Negative for nausea and vomiting.  Skin: Positive for color change and wound.    Allergies  Adhesive; Bactrim; Clarithromycin; Ibuprofen; Nexium; Nsaids; Sulfa antibiotics; and Latex  Home Medications   Prior to Admission medications   Medication Sig Start Date End Date Taking? Authorizing Provider  albuterol (PROVENTIL HFA;VENTOLIN HFA) 108 (90 BASE) MCG/ACT inhaler Inhale 2 puffs into the lungs every 4 (four) hours.     Historical Provider, MD  albuterol (PROVENTIL) (2.5 MG/3ML) 0.083% nebulizer solution Take 2.5 mg by nebulization every 6 (six) hours as needed for wheezing or shortness of breath.    Historical Provider, MD  brimonidine (ALPHAGAN P) 0.1 % SOLN Place 1 drop into both eyes 3 (three) times daily.    Historical Provider, MD  budesonide-formoterol (SYMBICORT) 160-4.5 MCG/ACT inhaler Inhale 1 puff into the lungs 2 (two) times daily.    Historical Provider, MD  cetirizine (ZYRTEC) 10 MG tablet Take 10 mg by mouth daily.    Historical Provider, MD  cyclobenzaprine (FLEXERIL) 10 MG tablet Take 10 mg by mouth 3 (three) times daily as needed. For muscle spasms.    Historical Provider, MD  cycloSPORINE (RESTASIS) 0.05 % ophthalmic emulsion Place 1 drop into both eyes 2 (two) times daily.    Historical Provider, MD  dicyclomine (BENTYL) 20 MG tablet Take 20 mg by mouth 3 (three) times daily.     Historical Provider, MD  furosemide (LASIX) 40 MG tablet Take 40 mg by mouth daily.     Historical Provider, MD  HYDROmorphone (DILAUDID) 2 MG tablet Take 1 tablet (2 mg total) by mouth every 4 (four) hours as needed for severe pain. 09/09/13   Garald Balding, NP  hydrOXYzine (ATARAX/VISTARIL) 25 MG tablet Take 25 mg by mouth 3 (three) times daily as needed for anxiety or itching.     Historical Provider, MD  Iron-FA-B Cmp-C-Biot-Probiotic (FUSION PLUS) CAPS Take 1 capsule by mouth daily.    Historical Provider, MD  ketotifen (REFRESH EYE ITCH RELIEF) 0.025 % ophthalmic solution Place 1 drop into both eyes daily as needed (dry eyes).     Historical Provider, MD  levothyroxine (  SYNTHROID, LEVOTHROID) 125 MCG tablet Take 125 mcg by mouth daily before breakfast.    Historical Provider, MD  loperamide (IMODIUM) 2 MG capsule Take 1 capsule (2 mg total) by mouth 4 (four) times daily as needed for diarrhea or loose stools. 05/24/13   Varney Biles, MD  Omeprazole-Sodium Bicarbonate (ZEGERID) 20-1100 MG CAPS capsule Take 1 capsule by mouth daily before breakfast.    Historical Provider, MD  ondansetron (ZOFRAN) 4 MG tablet Take 4 mg by mouth every 8 (eight) hours as needed for nausea or vomiting. 07/25/13   Antonietta Breach, PA-C  Oxycodone HCl 20 MG TABS Take 20 mg by mouth every 4 (four) hours.     Historical Provider, MD  oxymorphone (OPANA ER) 20 MG 12 hr tablet Take 20 mg by mouth every 12 (twelve) hours.    Historical Provider, MD  potassium chloride (K-DUR,KLOR-CON) 10 MEQ tablet Take 10 mEq by mouth daily.     Historical Provider, MD  predniSONE (DELTASONE) 5 MG tablet Take 5 mg by mouth daily with breakfast.    Historical Provider, MD  pregabalin (LYRICA) 75 MG capsule Take 75 mg by mouth daily.    Historical Provider, MD  promethazine (PHENERGAN) 25 MG tablet Take 25 mg by mouth every 6 (six) hours as needed for nausea or vomiting.    Historical Provider, MD  Vitamin D, Ergocalciferol, (DRISDOL) 50000 UNITS CAPS Take 50,000 Units by mouth every 7 (seven) days. Take on Saturdays.    Historical  Provider, MD   Triage Vitals: BP 156/81  Pulse 103  Temp(Src) 97.8 F (36.6 C) (Oral)  Resp 18  SpO2 98% Physical Exam  Nursing note and vitals reviewed. Constitutional: Alexis [name] is oriented to person, place, and time. Alexis [name] appears well-developed and well-nourished.  HENT:  Head: Normocephalic and atraumatic.  Eyes: EOM are normal.  Neck: Normal range of motion.  Cardiovascular: Normal rate.   Pulmonary/Chest: Effort normal.  Musculoskeletal: Normal range of motion.  Neurological: Alexis [name] is alert and oriented to person, place, and time.  Skin: Skin is warm and dry.  Exam chaperoned by female Scribe. 2 x 2 cm open wound/mild ulceration with minimal surrounding erythema and slight streaking. No palpable induration or fluid collection. No discharge.  Psychiatric: Alexis [name] has a normal mood and affect. Her behavior is normal.    ED Course  Procedures (including critical care time) DIAGNOSTIC STUDIES: Oxygen Saturation is 98% on RA, normal by my interpretation.   COORDINATION OF CARE: 10:44 AM- Will prescribe Keflex and Diflucan. Advised pt to apply warm compresses and return precautions discussed. Pt verbalizes understanding and agrees to plan.  Medications - No data to display  Labs Review Labs Reviewed - No data to display  Imaging Review No results found.   EKG Interpretation None      MDM   Final diagnoses:  Cellulitis of breast    Patient with mild cellulitis around possible bug bite on right breast.  Currently taking doxy.  Infection is minimal.  At this time, I feel that it is reasonable to switch to keflex and try warm compresses.  There was no abscess on exam and no drainage.  Return precautions given.  Return here in 2 days for wound check.  I personally performed the services described in this documentation, which was scribed in my presence. The recorded information has been reviewed and is accurate.    Montine Circle, PA-C 03/16/14 1050

## 2014-03-18 ENCOUNTER — Encounter (HOSPITAL_COMMUNITY): Payer: Self-pay | Admitting: Emergency Medicine

## 2014-03-18 ENCOUNTER — Emergency Department (HOSPITAL_COMMUNITY)
Admission: EM | Admit: 2014-03-18 | Discharge: 2014-03-18 | Disposition: A | Discharge status: Home / Self Care | Payer: Medicaid Other | Attending: Emergency Medicine | Admitting: Emergency Medicine

## 2014-03-18 DIAGNOSIS — Z79899 Other long term (current) drug therapy: Secondary | ICD-10-CM | POA: Insufficient documentation

## 2014-03-18 DIAGNOSIS — Z9981 Dependence on supplemental oxygen: Secondary | ICD-10-CM | POA: Insufficient documentation

## 2014-03-18 DIAGNOSIS — M179 Osteoarthritis of knee, unspecified: Secondary | ICD-10-CM | POA: Insufficient documentation

## 2014-03-18 DIAGNOSIS — Z8739 Personal history of other diseases of the musculoskeletal system and connective tissue: Secondary | ICD-10-CM | POA: Diagnosis not present

## 2014-03-18 DIAGNOSIS — G4733 Obstructive sleep apnea (adult) (pediatric): Secondary | ICD-10-CM | POA: Diagnosis not present

## 2014-03-18 DIAGNOSIS — Z872 Personal history of diseases of the skin and subcutaneous tissue: Secondary | ICD-10-CM | POA: Insufficient documentation

## 2014-03-18 DIAGNOSIS — Z792 Long term (current) use of antibiotics: Secondary | ICD-10-CM | POA: Insufficient documentation

## 2014-03-18 DIAGNOSIS — M1990 Unspecified osteoarthritis, unspecified site: Secondary | ICD-10-CM | POA: Insufficient documentation

## 2014-03-18 DIAGNOSIS — K551 Chronic vascular disorders of intestine: Secondary | ICD-10-CM | POA: Insufficient documentation

## 2014-03-18 DIAGNOSIS — H409 Unspecified glaucoma: Secondary | ICD-10-CM | POA: Diagnosis not present

## 2014-03-18 DIAGNOSIS — I10 Essential (primary) hypertension: Secondary | ICD-10-CM | POA: Diagnosis not present

## 2014-03-18 DIAGNOSIS — Z91040 Latex allergy status: Secondary | ICD-10-CM | POA: Insufficient documentation

## 2014-03-18 DIAGNOSIS — Z9889 Other specified postprocedural states: Secondary | ICD-10-CM | POA: Insufficient documentation

## 2014-03-18 DIAGNOSIS — Z87828 Personal history of other (healed) physical injury and trauma: Secondary | ICD-10-CM | POA: Insufficient documentation

## 2014-03-18 DIAGNOSIS — E890 Postprocedural hypothyroidism: Secondary | ICD-10-CM | POA: Insufficient documentation

## 2014-03-18 DIAGNOSIS — Z7951 Long term (current) use of inhaled steroids: Secondary | ICD-10-CM | POA: Insufficient documentation

## 2014-03-18 DIAGNOSIS — N61 Inflammatory disorders of breast: Secondary | ICD-10-CM | POA: Insufficient documentation

## 2014-03-18 DIAGNOSIS — J45909 Unspecified asthma, uncomplicated: Secondary | ICD-10-CM | POA: Insufficient documentation

## 2014-03-18 DIAGNOSIS — Z7952 Long term (current) use of systemic steroids: Secondary | ICD-10-CM | POA: Insufficient documentation

## 2014-03-18 DIAGNOSIS — Z87448 Personal history of other diseases of urinary system: Secondary | ICD-10-CM | POA: Insufficient documentation

## 2014-03-18 DIAGNOSIS — H40003 Preglaucoma, unspecified, bilateral: Secondary | ICD-10-CM | POA: Insufficient documentation

## 2014-03-18 DIAGNOSIS — Z85850 Personal history of malignant neoplasm of thyroid: Secondary | ICD-10-CM | POA: Diagnosis not present

## 2014-03-18 DIAGNOSIS — Z86010 Personal history of colonic polyps: Secondary | ICD-10-CM | POA: Insufficient documentation

## 2014-03-18 DIAGNOSIS — K219 Gastro-esophageal reflux disease without esophagitis: Secondary | ICD-10-CM | POA: Insufficient documentation

## 2014-03-18 MED ORDER — LIDOCAINE HCL (PF) 1 % IJ SOLN
INTRAMUSCULAR | Status: AC
  Administered 2014-03-18: 5 mL
  Filled 2014-03-18: qty 5

## 2014-03-18 MED ORDER — CEFTRIAXONE SODIUM 250 MG IJ SOLR
250.0000 mg | Freq: Once | INTRAMUSCULAR | Status: AC
  Administered 2014-03-18: 250 mg via INTRAMUSCULAR
  Filled 2014-03-18: qty 250

## 2014-03-18 NOTE — ED Provider Notes (Signed)
CSN: [id number]     Arrival date & time 03/18/14  1300 History   First MD Initiated Contact with Patient 03/18/14 1642     Chief Complaint  Patient presents with  . Cellulitis     (Consider location/radiation/quality/duration/timing/severity/associated sxs/prior Treatment) HPI Alexis [name] a 48 year old female with past medical history of hidradenitis suppurativa, hypertension, fibromyalgia, GERD, arthritis, asthma, degenerative disc disease who presents the ER today for a follow-up on a wound check.patient was here on 03/16/14 complaining of blisers to her mid abdomen and lateral right breast that began 12 days ago while camping. She reports being seen by her PCP after noticing these blisters who drained at one of the sites,andprescribed Doxycycline and Silvadene cream in which she has been taking and applying as directed. She reports some red streaking and moderate aching pain in surrounding areas of the chest wall and breast, which began Tuesday, 03/13/14. Pt states the blister on her breast has gotten worse, becoming malodorous, and she returned to the ER for further evaluation 2 days ago. Patient's antibiotics were changed to Keflex at that time, and she was encouraged to follow-up in 2 days. Lying on the area makes the pain worse. She does not report any alleviating factors. She reports some mild associated chills and she has been experiencing pain in her right upper breast and right shoulder.Denies fever, nausea, vomiting, CP and SOB. Pt reports allergies to Bactrim and Biaxin.   Past Medical History  Diagnosis Date  . Hypertension   . GERD (gastroesophageal reflux disease)   . Arthritis   . Asthma   . Dyspnea   . Degenerative disk disease   . Interstitial cystitis   . Endometriosis 01/18/01  . Fibromyalgia   . DJD (degenerative joint disease) of knee   . Right knee meniscal tear   . Sickle cell trait   . History of thyroid cancer     2010--  TOTAL THYROIDECTOMY--  NO RECURRENCE  .  History of thyroiditis   . Cervical spondylosis     C5-6  . OSA on CPAP   . Varicose veins   . History of colon polyps   . Hypothyroidism, postsurgical   . Lymphedema   . Glaucoma of both eyes   . Vocal cord paralysis, unilateral complete     RIGHT--  POST TOTAL THYROIDECTOMY  . Chronic colitis   . H/O hiatal hernia   . Complication of anesthesia     has awakened in past/  RIGHT VOCAL CORD PARALYSIS  POST TOTAL THYROIDECTOMY  03-08-2009 ([facility])  . Uterine fibroid   . Bilateral dry eyes    Past Surgical History  Procedure Laterality Date  . Achilles tendon repair Left 2007  . Vein surgery    . Temporal artery biopsy / ligation  2010  . Hydradenitis excision  06/29/2011    Procedure: EXCISION HYDRADENITIS AXILLA;  Surgeon: Hermelinda Dellen, MD;  Location: [place];  Service: Plastics;;  right breast hydradenitis excioion  . Dx laparoscopy/  pelvic bx's/  lysis adhesions  06-26-2000  . Removal perianal abscess  02-14-2001  . Temporal artery biopsy / ligation Right 06-16-2002  . Cysto/  urethral dilation /  bilateral unroofing ureterocele/  bilateral ureteral stent placement  12-01-2002  . Cysto/  bilateral retrograde pyelogram/  hydrodistention/  instillation therapy  05-08-2003  . Wide excision mass,  chest area  10-29-2003  . D & c hysteroscopy/  removal iud  12-06-2003  . Dilatation and curettage with suction  05-29-2005    RETAINED PLACENTA  . Cysto/  hydrodistention/  instillation therapy  06-04-2005  &  03-04-2007  . Total thyroidectomy  03-08-2009  . Pilonidal cyst excision  1983  . Tonsillectomy  1990  . Breast enhancement surgery Bilateral 1997  . Laparoscopy abdomen diagnostic  SEPT  2003   [place] I[facility]  . Left wrist tendon release  MAY  2007  . Bunionectomy with hammertoe reconstruction and gastroc slide Left FEB  2011    REMOVAL HARDWARE  NOV  2011  . Hydradenitis excision Left 2000  . Colonoscopy with  esophagogastroduodenoscopy (egd)  MULTIPLE --  LAST ONE   NOV  2014  . Knee arthroscopy with lateral menisectomy Right 09/08/2013    Procedure: RIGHT KNEE ARTHROSCOPY PARTIAL LATERAL MENISCECTOMY AND DEBRIDEMENT ;  Surgeon: Johnn Hai, MD;  Location: [place];  Service: Orthopedics;  Laterality: Right;   Family History  Problem Relation Age of Onset  . Cancer Paternal Aunt     breast, colon   History  Substance Use Topics  . Smoking status: Never Smoker   . Smokeless tobacco: Never Used  . Alcohol Use: Yes     Comment: rarely   OB History    Gravida Para Term Preterm AB TAB SAB Ectopic Multiple Living   1 1        1      Review of Systems  Constitutional: Negative for fever.  HENT: Negative for trouble swallowing.   Eyes: Negative for visual disturbance.  Respiratory: Negative for shortness of breath.   Cardiovascular: Negative for chest pain.  Gastrointestinal: Negative for nausea, vomiting and abdominal pain.  Genitourinary: Negative for dysuria.  Musculoskeletal: Negative for neck pain.  Skin: Positive for rash and wound.  Neurological: Negative for dizziness, weakness and numbness.  Psychiatric/Behavioral: Negative.       Allergies  Adhesive; Bactrim; Clarithromycin; Ibuprofen; Nexium; Nsaids; Sulfa antibiotics; and Latex  Home Medications   Prior to Admission medications   Medication Sig Start Date End Date Taking? Authorizing Provider  albuterol (PROVENTIL HFA;VENTOLIN HFA) 108 (90 BASE) MCG/ACT inhaler Inhale 2 puffs into the lungs every 4 (four) hours.     Historical Provider, MD  albuterol (PROVENTIL) (2.5 MG/3ML) 0.083% nebulizer solution Take 2.5 mg by nebulization every 6 (six) hours as needed for wheezing or shortness of breath.    Historical Provider, MD  brimonidine (ALPHAGAN P) 0.1 % SOLN Place 1 drop into both eyes 3 (three) times daily.    Historical Provider, MD  budesonide-formoterol (SYMBICORT) 160-4.5 MCG/ACT inhaler Inhale 1  puff into the lungs 2 (two) times daily.    Historical Provider, MD  cephALEXin (KEFLEX) 500 MG capsule Take 1 capsule (500 mg total) by mouth 4 (four) times daily. 03/16/14   Montine Circle, PA-C  cetirizine (ZYRTEC) 10 MG tablet Take 10 mg by mouth daily.    Historical Provider, MD  cyclobenzaprine (FLEXERIL) 10 MG tablet Take 10 mg by mouth 3 (three) times daily as needed. For muscle spasms.    Historical Provider, MD  cycloSPORINE (RESTASIS) 0.05 % ophthalmic emulsion Place 1 drop into both eyes 2 (two) times daily.    Historical Provider, MD  dicyclomine (BENTYL) 20 MG tablet Take 20 mg by mouth 3 (three) times daily.     Historical Provider, MD  fluconazole (DIFLUCAN) 150 MG tablet Take 1 tablet (150 mg total) by mouth once. 03/16/14   Montine Circle, PA-C  furosemide (LASIX) 40 MG tablet  Take 40 mg by mouth daily.    Historical Provider, MD  HYDROmorphone (DILAUDID) 2 MG tablet Take 1 tablet (2 mg total) by mouth every 4 (four) hours as needed for severe pain. 09/09/13   Garald Balding, NP  hydrOXYzine (ATARAX/VISTARIL) 25 MG tablet Take 25 mg by mouth 3 (three) times daily as needed for anxiety or itching.     Historical Provider, MD  Iron-FA-B Cmp-C-Biot-Probiotic (FUSION PLUS) CAPS Take 1 capsule by mouth daily.    Historical Provider, MD  ketotifen (REFRESH EYE ITCH RELIEF) 0.025 % ophthalmic solution Place 1 drop into both eyes daily as needed (dry eyes).     Historical Provider, MD  levothyroxine (SYNTHROID, LEVOTHROID) 125 MCG tablet Take 125 mcg by mouth daily before breakfast.    Historical Provider, MD  loperamide (IMODIUM) 2 MG capsule Take 1 capsule (2 mg total) by mouth 4 (four) times daily as needed for diarrhea or loose stools. 05/24/13   Varney Biles, MD  Omeprazole-Sodium Bicarbonate (ZEGERID) 20-1100 MG CAPS capsule Take 1 capsule by mouth daily before breakfast.    Historical Provider, MD  ondansetron (ZOFRAN) 4 MG tablet Take 4 mg by mouth every 8 (eight) hours as needed for  nausea or vomiting. 07/25/13   Antonietta Breach, PA-C  Oxycodone HCl 20 MG TABS Take 20 mg by mouth every 4 (four) hours.     Historical Provider, MD  oxymorphone (OPANA ER) 20 MG 12 hr tablet Take 20 mg by mouth every 12 (twelve) hours.    Historical Provider, MD  potassium chloride (K-DUR,KLOR-CON) 10 MEQ tablet Take 10 mEq by mouth daily.     Historical Provider, MD  predniSONE (DELTASONE) 5 MG tablet Take 5 mg by mouth daily with breakfast.    Historical Provider, MD  pregabalin (LYRICA) 75 MG capsule Take 75 mg by mouth daily.    Historical Provider, MD  promethazine (PHENERGAN) 25 MG tablet Take 25 mg by mouth every 6 (six) hours as needed for nausea or vomiting.    Historical Provider, MD  Vitamin D, Ergocalciferol, (DRISDOL) 50000 UNITS CAPS Take 50,000 Units by mouth every 7 (seven) days. Take on Saturdays.    Historical Provider, MD   BP 131/71 mmHg  Pulse 82  Temp(Src) 98.1 F (36.7 C)  Resp 17  SpO2 99% Physical Exam  Constitutional: She is oriented to person, place, and time. She appears well-developed and well-nourished. No distress.  HENT:  Head: Normocephalic and atraumatic.  Eyes: Right eye exhibits no discharge. Left eye exhibits no discharge. No scleral icterus.  Neck: Normal range of motion.  Pulmonary/Chest: Effort normal. No respiratory distress.  Musculoskeletal: Normal range of motion.  Lymphadenopathy:    She has axillary adenopathy.       Right axillary: Pectoral and lateral adenopathy present.       Right: No supraclavicular adenopathy present.  Neurological: She is alert and oriented to person, place, and time.  Skin: Skin is warm and dry. She is not diaphoretic.     Psychiatric: She has a normal mood and affect.  Nursing note and vitals reviewed.   ED Course  Procedures (including critical care time) Labs Review Labs Reviewed - No data to display  Imaging Review No results found.   EKG Interpretation None      MDM   Final diagnoses:    Cellulitis of female breast    Patient here with wound recheck of cellulitis. Patient showing multiple pictures of the progression of this wound on her cell phone,  and based on the pictures she has documented the cellulitis appears to be improving. Patient still does have some streaking superior to the wound which also appears to be improving, and patient agrees on the improvement. Patient stating her concern tonight was that she has been experiencing some pains which are radiating into her right axilla.I am unable to appreciate any visible signs of cellulitis which are progressing to her axilla. There is some mild lymphadenopathy in her right upper breast including the [place] and in her right axilla, however no obvious signs of cellulitis or spreading of infection. Patient given dose of antibiotics here tonight, and encouraged to continue the Keflex that she has been laced on. I strongly encouraged patient to follow up with her primary care physician to insure resolution of symptoms. I discussed return precautions with patient, and encourage her to return to the ER should her symptoms change, worsen, persist or should she have any questions or concerns.  BP 131/71 mmHg  Pulse 82  Temp(Src) 98.1 F (36.7 C)  Resp 17  SpO2 99%  Signed,  Dahlia Bailiff, PA-C 1:33 AM  This patient discussed with Dr. Pryor Curia, M.D.   Carrie Mew, PA-C 03/19/14 0134

## 2014-03-18 NOTE — ED Notes (Signed)
M[place], PA at bedside. 

## 2014-03-18 NOTE — Discharge Instructions (Signed)
Follow-up with your primary care physician. Continue using warm compresses, and taking Keflex as prescribed. Return to the ER with high fever, severe pain, nausea, vomiting.   Cellulitis Cellulitis is an infection of the skin and the tissue beneath it. The infected area is usually red and tender. Cellulitis occurs most often in the arms and lower legs.  CAUSES  Cellulitis is caused by bacteria that enter the skin through cracks or cuts in the skin. The most common types of bacteria that cause cellulitis are staphylococci and streptococci. SIGNS AND SYMPTOMS   Redness and warmth.  Swelling.  Tenderness or pain.  Fever. DIAGNOSIS  Your health care provider can usually determine what is wrong based on a physical exam. Blood tests may also be done. TREATMENT  Treatment usually involves taking an antibiotic medicine. HOME CARE INSTRUCTIONS   Take your antibiotic medicine as directed by your health care provider. Finish the antibiotic even if you start to feel better.  Keep the infected arm or leg elevated to reduce swelling.  Apply a warm cloth to the affected area up to 4 times per day to relieve pain.  Take medicines only as directed by your health care provider.  Keep all follow-up visits as directed by your health care provider. SEEK MEDICAL CARE IF:   You notice red streaks coming from the infected area.  Your red area gets larger or turns dark in color.  Your bone or joint underneath the infected area becomes painful after the skin has healed.  Your infection returns in the same area or another area.  You notice a swollen bump in the infected area.  You develop new symptoms.  You have a fever. SEEK IMMEDIATE MEDICAL CARE IF:   You feel very sleepy.  You develop vomiting or diarrhea.  You have a general ill feeling (malaise) with muscle aches and pains. MAKE SURE YOU:   Understand these instructions.  Will watch your condition.  Will get help right away if  you are not doing well or get worse. Document Released: 02/11/2005 Document Revised: 09/18/2013 Document Reviewed: 07/20/2011 [facility] Patient Information 2015 [place], [place]. This information is not intended to replace advice given to you by your health care provider. Make sure you discuss any questions you have with your health care provider.

## 2014-03-18 NOTE — ED Notes (Signed)
Pt here for  Recheck of cellulitis. sts she thinks it looks better but now there is streaking.

## 2014-03-19 ENCOUNTER — Encounter (HOSPITAL_COMMUNITY): Payer: Self-pay | Admitting: Emergency Medicine

## 2014-08-07 ENCOUNTER — Emergency Department (HOSPITAL_COMMUNITY)
Admission: EM | Admit: 2014-08-07 | Discharge: 2014-08-08 | Disposition: A | Discharge status: Home / Self Care | Payer: Medicaid Other | Attending: Emergency Medicine | Admitting: Emergency Medicine

## 2014-08-07 ENCOUNTER — Encounter (HOSPITAL_COMMUNITY): Payer: Self-pay | Admitting: Emergency Medicine

## 2014-08-07 DIAGNOSIS — M1990 Unspecified osteoarthritis, unspecified site: Secondary | ICD-10-CM | POA: Insufficient documentation

## 2014-08-07 DIAGNOSIS — Z87448 Personal history of other diseases of urinary system: Secondary | ICD-10-CM | POA: Insufficient documentation

## 2014-08-07 DIAGNOSIS — Z9981 Dependence on supplemental oxygen: Secondary | ICD-10-CM | POA: Insufficient documentation

## 2014-08-07 DIAGNOSIS — G4733 Obstructive sleep apnea (adult) (pediatric): Secondary | ICD-10-CM | POA: Diagnosis not present

## 2014-08-07 DIAGNOSIS — R1084 Generalized abdominal pain: Secondary | ICD-10-CM | POA: Diagnosis not present

## 2014-08-07 DIAGNOSIS — Z86010 Personal history of colonic polyps: Secondary | ICD-10-CM | POA: Insufficient documentation

## 2014-08-07 DIAGNOSIS — D573 Sickle-cell trait: Secondary | ICD-10-CM | POA: Diagnosis not present

## 2014-08-07 DIAGNOSIS — R109 Unspecified abdominal pain: Secondary | ICD-10-CM | POA: Diagnosis present

## 2014-08-07 DIAGNOSIS — Z7952 Long term (current) use of systemic steroids: Secondary | ICD-10-CM | POA: Diagnosis not present

## 2014-08-07 DIAGNOSIS — E890 Postprocedural hypothyroidism: Secondary | ICD-10-CM | POA: Diagnosis not present

## 2014-08-07 DIAGNOSIS — R1110 Vomiting, unspecified: Secondary | ICD-10-CM | POA: Diagnosis not present

## 2014-08-07 DIAGNOSIS — Z87828 Personal history of other (healed) physical injury and trauma: Secondary | ICD-10-CM | POA: Diagnosis not present

## 2014-08-07 DIAGNOSIS — J45909 Unspecified asthma, uncomplicated: Secondary | ICD-10-CM | POA: Insufficient documentation

## 2014-08-07 DIAGNOSIS — Z85850 Personal history of malignant neoplasm of thyroid: Secondary | ICD-10-CM | POA: Insufficient documentation

## 2014-08-07 DIAGNOSIS — Z792 Long term (current) use of antibiotics: Secondary | ICD-10-CM | POA: Diagnosis not present

## 2014-08-07 DIAGNOSIS — Z79899 Other long term (current) drug therapy: Secondary | ICD-10-CM | POA: Diagnosis not present

## 2014-08-07 DIAGNOSIS — R197 Diarrhea, unspecified: Secondary | ICD-10-CM | POA: Diagnosis not present

## 2014-08-07 DIAGNOSIS — Z91040 Latex allergy status: Secondary | ICD-10-CM | POA: Diagnosis not present

## 2014-08-07 DIAGNOSIS — I10 Essential (primary) hypertension: Secondary | ICD-10-CM | POA: Insufficient documentation

## 2014-08-07 DIAGNOSIS — K219 Gastro-esophageal reflux disease without esophagitis: Secondary | ICD-10-CM | POA: Diagnosis not present

## 2014-08-07 DIAGNOSIS — Z8742 Personal history of other diseases of the female genital tract: Secondary | ICD-10-CM | POA: Diagnosis not present

## 2014-08-07 DIAGNOSIS — R000 Tachycardia, unspecified: Secondary | ICD-10-CM | POA: Diagnosis not present

## 2014-08-07 LAB — RAPID URINE DRUG SCREEN, HOSP PERFORMED
Amphetamines: NOT DETECTED
Barbiturates: NOT DETECTED
Benzodiazepines: NOT DETECTED
Cocaine: NOT DETECTED
Opiates: NOT DETECTED
Tetrahydrocannabinol: NOT DETECTED

## 2014-08-07 LAB — CBC WITH DIFFERENTIAL/PLATELET
Basophils Absolute: 0 10*3/uL (ref 0.0–0.1)
Basophils Relative: 0 % (ref 0–1)
Eosinophils Absolute: 0 10*3/uL (ref 0.0–0.7)
Eosinophils Relative: 0 % (ref 0–5)
HCT: 39.6 % (ref 36.0–46.0)
Hemoglobin: 13.3 g/dL (ref 12.0–15.0)
Lymphocytes Relative: 8 % — ABNORMAL LOW (ref 12–46)
Lymphs Abs: 1.7 10*3/uL (ref 0.7–4.0)
MCH: 28.9 pg (ref 26.0–34.0)
MCHC: 33.6 g/dL (ref 30.0–36.0)
MCV: 85.9 fL (ref 78.0–100.0)
Monocytes Absolute: 0.9 10*3/uL (ref 0.1–1.0)
Monocytes Relative: 4 % (ref 3–12)
Neutro Abs: 18.2 10*3/uL — ABNORMAL HIGH (ref 1.7–7.7)
Neutrophils Relative %: 88 % — ABNORMAL HIGH (ref 43–77)
Platelets: 405 10*3/uL — ABNORMAL HIGH (ref 150–400)
RBC: 4.61 MIL/uL (ref 3.87–5.11)
RDW: 13.8 % (ref 11.5–15.5)
WBC: 20.8 10*3/uL — ABNORMAL HIGH (ref 4.0–10.5)

## 2014-08-07 LAB — LIPASE, BLOOD: Lipase: 20 U/L (ref 11–59)

## 2014-08-07 LAB — COMPREHENSIVE METABOLIC PANEL
ALT: 12 U/L (ref 0–35)
AST: 13 U/L (ref 0–37)
Albumin: 4.2 g/dL (ref 3.5–5.2)
Alkaline Phosphatase: 85 U/L (ref 39–117)
Anion gap: 12 (ref 5–15)
BUN: 9 mg/dL (ref 6–23)
CO2: 23 mmol/L (ref 19–32)
Calcium: 9.2 mg/dL (ref 8.4–10.5)
Chloride: 103 mmol/L (ref 96–112)
Creatinine, Ser: 0.73 mg/dL (ref 0.50–1.10)
GFR calc Af Amer: >90 mL/min (ref >90)
GFR calc non Af Amer: >90 mL/min (ref >90)
Glucose, Bld: 163 mg/dL — ABNORMAL HIGH (ref 70–99)
Potassium: 3.4 mmol/L — ABNORMAL LOW (ref 3.5–5.1)
Sodium: 138 mmol/L (ref 135–145)
Total Bilirubin: 0.6 mg/dL (ref 0.3–1.2)
Total Protein: 8.1 g/dL (ref 6.0–8.3)

## 2014-08-07 LAB — I-STAT CHEM 8, ED
BUN: 8 mg/dL (ref 6–23)
Calcium, Ion: 1.04 mmol/L — ABNORMAL LOW (ref 1.12–1.23)
Chloride: 108 mmol/L (ref 96–112)
Creatinine, Ser: 0.7 mg/dL (ref 0.50–1.10)
Glucose, Bld: 156 mg/dL — ABNORMAL HIGH (ref 70–99)
HCT: 46 % (ref 36.0–46.0)
Hemoglobin: 15.6 g/dL — ABNORMAL HIGH (ref 12.0–15.0)
Potassium: 3.6 mmol/L (ref 3.5–5.1)
Sodium: 141 mmol/L (ref 135–145)
TCO2: 21 mmol/L (ref 0–100)

## 2014-08-07 LAB — POC URINE PREG, ED: Preg Test, Ur: NEGATIVE

## 2014-08-07 MED ORDER — MORPHINE SULFATE 4 MG/ML IJ SOLN
4.0000 mg | Freq: Once | INTRAMUSCULAR | Status: AC
  Administered 2014-08-07: 4 mg via INTRAVENOUS
  Filled 2014-08-07: qty 1

## 2014-08-07 MED ORDER — ONDANSETRON HCL 4 MG/2ML IJ SOLN
4.0000 mg | Freq: Once | INTRAMUSCULAR | Status: AC
  Administered 2014-08-07: 4 mg via INTRAVENOUS
  Filled 2014-08-07: qty 2

## 2014-08-07 MED ORDER — SODIUM CHLORIDE 0.9 % IV BOLUS (SEPSIS)
1000.0000 mL | Freq: Once | INTRAVENOUS | Status: AC
  Administered 2014-08-07: 1000 mL via INTRAVENOUS

## 2014-08-07 NOTE — ED Provider Notes (Addendum)
CSN: [id number]     Arrival date & time 08/07/14  2149 History  This chart was scribed for non-physician practitioner, [name], NP-C working with Rolland Porter, MD, by Chester Holstein, ED Scribe. This patient was seen in room WA20/WA20 and the patient's care was started at 11:47 PM.    Chief Complaint  Patient presents with  . Abdominal Pain     Patient is a 49 y.o. female presenting with abdominal pain.  Abdominal Pain Associated symptoms: diarrhea and vomiting   Associated symptoms: no fever, no shortness of breath, no vaginal bleeding and no vaginal discharge    HPI Comments: Alexis [name] is a 49 y.o. female who presents to the Emergency Department complainin abdominal pain with onset today. Pt notes assocaited watery diarrhea, nausea and vomiting. Pt was last seen in ED 2 weeks ago with CT scan done; results normal. Pt with h/o IBS, IUD with irregular menses, ovarian cysts, and colitis. Pt LNMP was 2 weeks ago. Pt denies fever, vaginal bleeding, vaginal discharge, SOB, and hematochezia.     Past Medical History  Diagnosis Date  . Hypertension   . GERD (gastroesophageal reflux disease)   . Arthritis   . Asthma   . Dyspnea   . Degenerative disk disease   . Interstitial cystitis   . Endometriosis 01/18/01  . Fibromyalgia   . DJD (degenerative joint disease) of knee   . Right knee meniscal tear   . Sickle cell trait   . History of thyroid cancer     2010--  TOTAL THYROIDECTOMY--  NO RECURRENCE  . History of thyroiditis   . Cervical spondylosis     C5-6  . OSA on CPAP   . Varicose veins   . History of colon polyps   . Hypothyroidism, postsurgical   . Lymphedema   . Glaucoma of both eyes   . Vocal cord paralysis, unilateral complete     RIGHT--  POST TOTAL THYROIDECTOMY  . Chronic colitis   . H/O hiatal hernia   . Complication of anesthesia     has awakened in past/  RIGHT VOCAL CORD PARALYSIS  POST TOTAL THYROIDECTOMY  03-08-2009 ([facility])  . Uterine fibroid   .  Bilateral dry eyes    Past Surgical History  Procedure Laterality Date  . Achilles tendon repair Left 2007  . Vein surgery    . Temporal artery biopsy / ligation  2010  . Hydradenitis excision  06/29/2011    Procedure: EXCISION HYDRADENITIS AXILLA;  Surgeon: Hermelinda Dellen, MD;  Location: [facility];  Service: Plastics;;  right breast hydradenitis excioion  . Dx laparoscopy/  pelvic bx's/  lysis adhesions  06-26-2000  . Removal perianal abscess  02-14-2001  . Temporal artery biopsy / ligation Right 06-16-2002  . Cysto/  urethral dilation /  bilateral unroofing ureterocele/  bilateral ureteral stent placement  12-01-2002  . Cysto/  bilateral retrograde pyelogram/  hydrodistention/  instillation therapy  05-08-2003  . Wide excision mass,  chest area  10-29-2003  . D & c hysteroscopy/  removal iud  12-06-2003  . Dilatation and curettage with suction  05-29-2005    RETAINED PLACENTA  . Cysto/  hydrodistention/  instillation therapy  06-04-2005  &  03-04-2007  . Total thyroidectomy  03-08-2009  . Pilonidal cyst excision  1983  . Tonsillectomy  1990  . Breast enhancement surgery Bilateral 1997  . Laparoscopy abdomen diagnostic  SEPT  2003   [place]    AND  PLACEMENT IUD  . Left wrist tendon release  MAY  2007  . Bunionectomy with hammertoe reconstruction and gastroc slide Left FEB  2011    REMOVAL HARDWARE  NOV  2011  . Hydradenitis excision Left 2000  . Colonoscopy with esophagogastroduodenoscopy (egd)  MULTIPLE --  LAST ONE   NOV  2014  . Knee arthroscopy with lateral menisectomy Right 09/08/2013    Procedure: RIGHT KNEE ARTHROSCOPY PARTIAL LATERAL MENISCECTOMY AND DEBRIDEMENT ;  Surgeon: Johnn Hai, MD;  Location: [facility];  Service: Orthopedics;  Laterality: Right;   Family History  Problem Relation Age of Onset  . Cancer Paternal Aunt     breast, colon   History  Substance Use Topics  . Smoking status: Never Smoker   . Smokeless  tobacco: Never Used  . Alcohol Use: Yes     Comment: rarely   OB History    Gravida Para Term Preterm AB TAB SAB Ectopic Multiple Living   1 1        1      Review of Systems  Constitutional: Negative for fever.  Respiratory: Negative for shortness of breath.   Gastrointestinal: Positive for vomiting, abdominal pain and diarrhea. Negative for blood in stool.  Genitourinary: Negative for vaginal bleeding and vaginal discharge.      Allergies  Adhesive; Bactrim; Clarithromycin; Ibuprofen; Nexium; Nsaids; Sulfa antibiotics; and Latex  Home Medications   Prior to Admission medications   Medication Sig Start Date End Date Taking? Authorizing Provider  albuterol (PROVENTIL HFA;VENTOLIN HFA) 108 (90 BASE) MCG/ACT inhaler Inhale 2 puffs into the lungs every 4 (four) hours.     Historical Provider, MD  albuterol (PROVENTIL) (2.5 MG/3ML) 0.083% nebulizer solution Take 2.5 mg by nebulization every 6 (six) hours as needed for wheezing or shortness of breath.    Historical Provider, MD  brimonidine (ALPHAGAN P) 0.1 % SOLN Place 1 drop into both eyes 3 (three) times daily.    Historical Provider, MD  budesonide-formoterol (SYMBICORT) 160-4.5 MCG/ACT inhaler Inhale 1 puff into the lungs 2 (two) times daily.    Historical Provider, MD  cephALEXin (KEFLEX) 500 MG capsule Take 1 capsule (500 mg total) by mouth 4 (four) times daily. 03/16/14   Montine Circle, PA-C  cetirizine (ZYRTEC) 10 MG tablet Take 10 mg by mouth daily.    Historical Provider, MD  cyclobenzaprine (FLEXERIL) 10 MG tablet Take 10 mg by mouth 3 (three) times daily as needed. For muscle spasms.    Historical Provider, MD  cycloSPORINE (RESTASIS) 0.05 % ophthalmic emulsion Place 1 drop into both eyes 2 (two) times daily.    Historical Provider, MD  dicyclomine (BENTYL) 20 MG tablet Take 20 mg by mouth 3 (three) times daily.     Historical Provider, MD  fluconazole (DIFLUCAN) 150 MG tablet Take 1 tablet (150 mg total) by mouth once.  03/16/14   Montine Circle, PA-C  furosemide (LASIX) 40 MG tablet Take 40 mg by mouth daily.    Historical Provider, MD  HYDROmorphone (DILAUDID) 2 MG tablet Take 1 tablet (2 mg total) by mouth every 4 (four) hours as needed for severe pain. 09/09/13   [name], NP  hydrOXYzine (ATARAX/VISTARIL) 25 MG tablet Take 25 mg by mouth 3 (three) times daily as needed for anxiety or itching.     Historical Provider, MD  Iron-FA-B Cmp-C-Biot-Probiotic (FUSION PLUS) CAPS Take 1 capsule by mouth daily.    Historical Provider, MD  ketotifen (REFRESH EYE ITCH RELIEF) 0.025 % ophthalmic solution  Place 1 drop into both eyes daily as needed (dry eyes).     Historical Provider, MD  levothyroxine (SYNTHROID, LEVOTHROID) 125 MCG tablet Take 125 mcg by mouth daily before breakfast.    Historical Provider, MD  loperamide (IMODIUM) 2 MG capsule Take 1 capsule (2 mg total) by mouth 4 (four) times daily as needed for diarrhea or loose stools. 05/24/13   Varney Biles, MD  metoCLOPramide (REGLAN) 10 MG tablet Take 1 tablet (10 mg total) by mouth every 6 (six) hours. 08/08/14   [name], NP  Omeprazole-Sodium Bicarbonate (ZEGERID) 20-1100 MG CAPS capsule Take 1 capsule by mouth daily before breakfast.    Historical Provider, MD  ondansetron (ZOFRAN) 4 MG tablet Take 4 mg by mouth every 8 (eight) hours as needed for nausea or vomiting. 07/25/13   Antonietta Breach, PA-C  Oxycodone HCl 20 MG TABS Take 20 mg by mouth every 4 (four) hours.     Historical Provider, MD  oxyCODONE-acetaminophen (PERCOCET/ROXICET) 5-325 MG per tablet Take 1-2 tablets by mouth every 6 (six) hours as needed for severe pain. 08/08/14   [name], NP  oxymorphone (OPANA ER) 20 MG 12 hr tablet Take 20 mg by mouth every 12 (twelve) hours.    Historical Provider, MD  potassium chloride (K-DUR,KLOR-CON) 10 MEQ tablet Take 10 mEq by mouth daily.     Historical Provider, MD  predniSONE (DELTASONE) 5 MG tablet Take 5 mg by mouth daily with breakfast.    Historical  Provider, MD  pregabalin (LYRICA) 75 MG capsule Take 75 mg by mouth daily.    Historical Provider, MD  promethazine (PHENERGAN) 25 MG tablet Take 25 mg by mouth every 6 (six) hours as needed for nausea or vomiting.    Historical Provider, MD  Vitamin D, Ergocalciferol, (DRISDOL) 50000 UNITS CAPS Take 50,000 Units by mouth every 7 (seven) days. Take on Saturdays.    Historical Provider, MD   BP 116/86 mmHg  Pulse 92  Temp(Src) 98 F (36.7 C) (Oral)  Resp 16  SpO2 97%  LMP 07/25/2014 Physical Exam  Constitutional: She is oriented to person, place, and time. She appears well-developed and well-nourished.  HENT:  Head: Normocephalic.  Eyes: Conjunctivae are normal.  Neck: Normal range of motion. Neck supple.  Cardiovascular: Regular rhythm and normal heart sounds.  Tachycardia present.   Pulmonary/Chest: Effort normal and breath sounds normal. No respiratory distress. She has no decreased breath sounds. She has no wheezes. She has no rhonchi.  Abdominal: Bowel sounds are increased. There is tenderness (diffuse).  Musculoskeletal: Normal range of motion.       Thoracic back: She exhibits no tenderness.       Lumbar back: She exhibits no tenderness.  Neurological: She is alert and oriented to person, place, and time.  Skin: Skin is warm and dry.  Psychiatric: She has a normal mood and affect. Her behavior is normal.  Nursing note and vitals reviewed.   ED Course  Procedures (including critical care time) DIAGNOSTIC STUDIES: Oxygen Saturation is 98% on room air, normal by my interpretation.    COORDINATION OF CARE: 11:29 PM Discussed treatment plan with patient at beside, the patient agrees with the plan and has no further questions at this time.   Labs Review Labs Reviewed  CBC WITH DIFFERENTIAL/PLATELET - Abnormal; Notable for the following:    WBC 20.8 (*)    Platelets 405 (*)    Neutrophils Relative % 88 (*)    Neutro Abs 18.2 (*)  Lymphocytes Relative 8 (*)    All other  components within normal limits  COMPREHENSIVE METABOLIC PANEL - Abnormal; Notable for the following:    Potassium 3.4 (*)    Glucose, Bld 163 (*)    All other components within normal limits  URINALYSIS, ROUTINE W REFLEX MICROSCOPIC - Abnormal; Notable for the following:    APPearance CLOUDY (*)    Hgb urine dipstick MODERATE (*)    Ketones, ur >80 (*)    Protein, ur 30 (*)    All other components within normal limits  I-STAT CHEM 8, ED - Abnormal; Notable for the following:    Glucose, Bld 156 (*)    Calcium, Ion 1.04 (*)    Hemoglobin 15.6 (*)    All other components within normal limits  LIPASE, BLOOD  URINE RAPID DRUG SCREEN (HOSP PERFORMED)  URINE MICROSCOPIC-ADD ON  POC URINE PREG, ED    Imaging Review Ct Abdomen Pelvis W Contrast  08/08/2014   CLINICAL DATA:  Abdominal pain, nausea, vomiting, and diarrhea.  EXAM: CT ABDOMEN AND PELVIS WITH CONTRAST  TECHNIQUE: Multidetector CT imaging of the abdomen and pelvis was performed using the standard protocol following bolus administration of intravenous contrast.  CONTRAST:  163mL OMNIPAQUE IOHEXOL 300 MG/ML  SOLN  COMPARISON:  CT 07/25/2013  FINDINGS: The included lung bases are clear.  The liver demonstrates diffusely decreased density consistent with hepatic steatosis. Mild fatty sparing adjacent to the gallbladder fossa. Gallbladder is decompressed. The spleen, pancreas, and adrenal glands are normal. The kidneys demonstrate symmetric enhancement and excretion. No hydronephrosis. There is a 1.5 cm cyst in the mid left kidney, and 9 mm cyst in the upper right kidney. An additional tiny hypodensity is seen in the upper right kidney. No significant perinephric stranding.  Stomach is physiologically distended. There are no dilated or thickened bowel loops. The appendix tentatively identified and normal. Small volume of stool throughout the colon. No free air, free fluid, or intra-abdominal fluid collection.  The abdominal aorta is normal in  caliber. There is no retroperitoneal adenopathy. Subcutaneous soft tissues are normal.  The urinary bladder is physiologically distended. Probable uterine fibroid. No adnexal mass. No pelvic free fluid.  Degenerative change in the lumbar spine at L4-L5 and L5-S1. No acute or suspicious osseous abnormality.  IMPRESSION: 1. No acute abnormality in the abdomen/pelvis. 2. Chronic findings include hepatic steatosis, bilateral renal cysts, and probably uterine fibroids.   Electronically Signed   By: Jeb Levering M.D.   On: 08/08/2014 02:45     EKG Interpretation None     Skull CT findings with patient, although she does have an elevated white count file is safe for her to return home with pain control and antiemetic and follow-up with Dr. Cristina Gong, who treats her for her IBS  Looked up on the [place] control substance abuse reporting system, and she received 6020 mg Opana on February 27 percent.  Her 30 day supply as well as 180 30 mg oxycodone on February 27, which was again a 30 day supply MDM   Final diagnoses:  Abdominal cramping  Diarrhea    Will get labs, urine, IV fluids, pain and nausea control. At this time I do not feel a repeat CT scan is warranted because she had a normal CT scan 2 weeks ago.    Due to increase white count.  Will repeat CT scan    [name], NP 08/08/14 [place], MD 08/08/14 0354  [name], NP 08/08/14  [place], MD 08/08/14 [id number]

## 2014-08-07 NOTE — ED Notes (Signed)
Per ems pt from home, co generalized abdominal pain started today. Emesis an diarrhea. Hx colitis, pt reports this episode is worse. Pt received 4 mg Zofran IV. No emesis after that. Pt appears to be agitated and per ems pt was combative. Pt was given 100 mcg Fentanyl IV. Pt reports no relief and pain 10/10.

## 2014-08-07 NOTE — ED Notes (Signed)
Bed: WA20 Expected date:  Expected time:  Means of arrival:  Comments: EMS Abdominal pain

## 2014-08-08 ENCOUNTER — Emergency Department (HOSPITAL_COMMUNITY): Payer: Medicaid Other

## 2014-08-08 LAB — URINALYSIS, ROUTINE W REFLEX MICROSCOPIC
Bilirubin Urine: NEGATIVE
Glucose, UA: NEGATIVE mg/dL
Ketones, ur: >80 mg/dL — AB
Leukocytes, UA: NEGATIVE
Nitrite: NEGATIVE
Protein, ur: 30 mg/dL — AB
Specific Gravity, Urine: 1.02 (ref 1.005–1.030)
Urobilinogen, UA: 0.2 mg/dL (ref 0.0–1.0)
pH: 7 (ref 5.0–8.0)

## 2014-08-08 LAB — URINE MICROSCOPIC-ADD ON

## 2014-08-08 MED ORDER — METOCLOPRAMIDE HCL 10 MG PO TABS: 10.0000 mg | ORAL_TABLET | Freq: Four times a day (QID) | ORAL | Status: DC

## 2014-08-08 MED ORDER — IOHEXOL 300 MG/ML  SOLN
50.0000 mL | Freq: Once | INTRAMUSCULAR | Status: AC | PRN
  Administered 2014-08-08: 50 mL via ORAL

## 2014-08-08 MED ORDER — DIPHENHYDRAMINE HCL 50 MG/ML IJ SOLN
12.5000 mg | Freq: Once | INTRAMUSCULAR | Status: AC
  Administered 2014-08-08: 12.5 mg via INTRAVENOUS
  Filled 2014-08-08: qty 1

## 2014-08-08 MED ORDER — OXYCODONE-ACETAMINOPHEN 5-325 MG PO TABS: 1.0000 | ORAL_TABLET | Freq: Four times a day (QID) | ORAL | Status: DC | PRN

## 2014-08-08 MED ORDER — HYDROMORPHONE HCL 1 MG/ML IJ SOLN
1.0000 mg | Freq: Once | INTRAMUSCULAR | Status: AC
  Administered 2014-08-08: 1 mg via INTRAVENOUS
  Filled 2014-08-08: qty 1

## 2014-08-08 MED ORDER — METOCLOPRAMIDE HCL 5 MG/ML IJ SOLN
10.0000 mg | Freq: Once | INTRAMUSCULAR | Status: AC
  Administered 2014-08-08: 10 mg via INTRAVENOUS
  Filled 2014-08-08: qty 2

## 2014-08-08 MED ORDER — HYDROMORPHONE HCL 1 MG/ML IJ SOLN
1.0000 mg | Freq: Once | INTRAMUSCULAR | Status: AC
Start: 2014-08-08 — End: 2014-08-08
  Administered 2014-08-08: 1 mg via INTRAVENOUS
  Filled 2014-08-08: qty 1

## 2014-08-08 MED ORDER — IOHEXOL 300 MG/ML  SOLN
100.0000 mL | Freq: Once | INTRAMUSCULAR | Status: AC | PRN
  Administered 2014-08-08: 100 mL via INTRAVENOUS

## 2014-08-08 NOTE — ED Notes (Signed)
Patient transported to CT 

## 2014-08-08 NOTE — Discharge Instructions (Signed)
Abdominal Pain, Women °Abdominal (stomach, pelvic, or belly) pain can be caused by many things. It is important to tell your doctor: °· The location of the pain. °· Does it come and go or is it present all the time? °· Are there things that start the pain (eating certain foods, exercise)? °· Are there other symptoms associated with the pain (fever, nausea, vomiting, diarrhea)? °All of this is helpful to know when trying to find the cause of the pain. °CAUSES  °· Stomach: virus or bacteria infection, or ulcer. °· Intestine: appendicitis (inflamed appendix), regional ileitis (Crohn's disease), ulcerative colitis (inflamed colon), irritable bowel syndrome, diverticulitis (inflamed diverticulum of the colon), or cancer of the stomach or intestine. °· Gallbladder disease or stones in the gallbladder. °· Kidney disease, kidney stones, or infection. °· Pancreas infection or cancer. °· Fibromyalgia (pain disorder). °· Diseases of the female organs: °¨ Uterus: fibroid (non-cancerous) tumors or infection. °¨ Fallopian tubes: infection or tubal pregnancy. °¨ Ovary: cysts or tumors. °¨ Pelvic adhesions (scar tissue). °¨ Endometriosis (uterus lining tissue growing in the pelvis and on the pelvic organs). °¨ Pelvic congestion syndrome (female organs filling up with blood just before the menstrual period). °¨ Pain with the menstrual period. °¨ Pain with ovulation (producing an egg). °¨ Pain with an IUD (intrauterine device, birth control) in the uterus. °¨ Cancer of the female organs. °· Functional pain (pain not caused by a disease, may improve without treatment). °· Psychological pain. °· Depression. °DIAGNOSIS  °Your doctor will decide the seriousness of your pain by doing an examination. °· Blood tests. °· X-rays. °· Ultrasound. °· CT scan (computed tomography, special type of X-ray). °· MRI (magnetic resonance imaging). °· Cultures, for infection. °· Barium enema (dye inserted in the large intestine, to better view it with  X-rays). °· Colonoscopy (looking in intestine with a lighted tube). °· Laparoscopy (minor surgery, looking in abdomen with a lighted tube). °· Major abdominal exploratory surgery (looking in abdomen with a large incision). °TREATMENT  °The treatment will depend on the cause of the pain.  °· Many cases can be observed and treated at home. °· Over-the-counter medicines recommended by your caregiver. °· Prescription medicine. °· Antibiotics, for infection. °· Birth control pills, for painful periods or for ovulation pain. °· Hormone treatment, for endometriosis. °· Nerve blocking injections. °· Physical therapy. °· Antidepressants. °· Counseling with a psychologist or psychiatrist. °· Minor or major surgery. °HOME CARE INSTRUCTIONS  °· Do not take laxatives, unless directed by your caregiver. °· Take over-the-counter pain medicine only if ordered by your caregiver. Do not take aspirin because it can cause an upset stomach or bleeding. °· Try a clear liquid diet (broth or water) as ordered by your caregiver. Slowly move to a bland diet, as tolerated, if the pain is related to the stomach or intestine. °· Have a thermometer and take your temperature several times a day, and record it. °· Bed rest and sleep, if it helps the pain. °· Avoid sexual intercourse, if it causes pain. °· Avoid stressful situations. °· Keep your follow-up appointments and tests, as your caregiver orders. °· If the pain does not go away with medicine or surgery, you may try: °¨ Acupuncture. °¨ Relaxation exercises (yoga, meditation). °¨ Group therapy. °¨ Counseling. °SEEK MEDICAL CARE IF:  °· You notice certain foods cause stomach pain. °· Your home care treatment is not helping your pain. °· You need stronger pain medicine. °· You want your IUD removed. °· You feel faint or   lightheaded. °· You develop nausea and vomiting. °· You develop a rash. °· You are having side effects or an allergy to your medicine. °SEEK IMMEDIATE MEDICAL CARE IF:  °· Your  pain does not go away or gets worse. °· You have a fever. °· Your pain is felt only in portions of the abdomen. The right side could possibly be appendicitis. The left lower portion of the abdomen could be colitis or diverticulitis. °· You are passing blood in your stools (bright red or black tarry stools, with or without vomiting). °· You have blood in your urine. °· You develop chills, with or without a fever. °· You pass out. °MAKE SURE YOU:  °· Understand these instructions. °· Will watch your condition. °· Will get help right away if you are not doing well or get worse. °Document Released: 03/01/2007 Document Revised: 09/18/2013 Document Reviewed: 03/21/2009 °[employer]® Patient Information ©2015 [employer]. This information is not intended to replace advice given to you by your health care provider. Make sure you discuss any questions you have with your health care provider. ° °

## 2014-11-16 ENCOUNTER — Other Ambulatory Visit: Payer: Self-pay | Admitting: Physician Assistant

## 2014-11-16 DIAGNOSIS — Z1231 Encounter for screening mammogram for malignant neoplasm of breast: Secondary | ICD-10-CM

## 2014-11-16 DIAGNOSIS — Z9889 Other specified postprocedural states: Secondary | ICD-10-CM

## 2014-11-28 ENCOUNTER — Ambulatory Visit
Admission: RE | Admit: 2014-11-28 | Discharge: 2014-11-28 | Disposition: A | Discharge status: Home / Self Care | Payer: Medicaid Other | Source: Ambulatory Visit | Attending: Physician Assistant | Admitting: Physician Assistant

## 2014-11-28 ENCOUNTER — Other Ambulatory Visit: Payer: Self-pay | Admitting: Physician Assistant

## 2014-11-28 DIAGNOSIS — Z9889 Other specified postprocedural states: Secondary | ICD-10-CM

## 2014-11-28 DIAGNOSIS — N6310 Unspecified lump in the right breast, unspecified quadrant: Secondary | ICD-10-CM

## 2014-11-28 DIAGNOSIS — Z1231 Encounter for screening mammogram for malignant neoplasm of breast: Secondary | ICD-10-CM

## 2014-12-11 ENCOUNTER — Telehealth: Payer: Self-pay

## 2014-12-11 ENCOUNTER — Ambulatory Visit: Payer: Medicaid Other | Admitting: Neurology

## 2014-12-11 NOTE — Telephone Encounter (Signed)
Patient canceled appointment 30 minutes prior to appointment time.

## 2014-12-14 ENCOUNTER — Ambulatory Visit (INDEPENDENT_AMBULATORY_CARE_PROVIDER_SITE_OTHER): Payer: Medicaid Other | Admitting: Neurology

## 2014-12-14 ENCOUNTER — Encounter: Payer: Self-pay | Admitting: Neurology

## 2014-12-14 VITALS — BP 122/82 | HR 89 | Ht 62.0 in | Wt 218.2 lb

## 2014-12-14 DIAGNOSIS — M79603 Pain in arm, unspecified: Secondary | ICD-10-CM

## 2014-12-14 DIAGNOSIS — R202 Paresthesia of skin: Secondary | ICD-10-CM | POA: Diagnosis not present

## 2014-12-14 DIAGNOSIS — R251 Tremor, unspecified: Secondary | ICD-10-CM | POA: Diagnosis not present

## 2014-12-14 DIAGNOSIS — E1140 Type 2 diabetes mellitus with diabetic neuropathy, unspecified: Secondary | ICD-10-CM | POA: Diagnosis not present

## 2014-12-14 DIAGNOSIS — M79602 Pain in left arm: Secondary | ICD-10-CM

## 2014-12-14 DIAGNOSIS — M79601 Pain in right arm: Secondary | ICD-10-CM

## 2014-12-14 HISTORY — DX: Type 2 diabetes mellitus with diabetic neuropathy, unspecified: E11.40

## 2014-12-14 HISTORY — DX: Tremor, unspecified: R25.1

## 2014-12-14 NOTE — Patient Instructions (Addendum)
   We will do EMG and NCV study to look for a neuropathy or carpal tunnel syndrome. We will check a MRI of the head due to the left sided numbness.   Tremor Tremor is a rhythmic, involuntary muscular contraction characterized by oscillations (to-and-fro movements) of a part of the body. The most common of all involuntary movements, tremor can affect various body parts such as the hands, head, facial structures, vocal cords, trunk, and legs; most tremors, however, occur in the hands. Tremor often accompanies neurological disorders associated with aging. Although the disorder is not life-threatening, it can be responsible for functional disability and social embarrassment. TREATMENT  There are many types of tremor and several ways in which tremor is classified. The most common classification is by behavioral context or position. There are five categories of tremor within this classification: resting, postural, kinetic, task-specific, and psychogenic. Resting or static tremor occurs when the muscle is at rest, for example when the hands are lying on the lap. This type of tremor is often seen in patients with Parkinson's disease. Postural tremor occurs when a patient attempts to maintain posture, such as holding the hands outstretched. Postural tremors include physiological tremor, essential tremor, tremor with basal ganglia disease (also seen in patients with Parkinson's disease), cerebellar postural tremor, tremor with peripheral neuropathy, post-traumatic tremor, and alcoholic tremor. Kinetic or intention (action) tremor occurs during purposeful movement, for example during finger-to-nose testing. Task-specific tremor appears when performing goal-oriented tasks such as handwriting, speaking, or standing. This group consists of primary writing tremor, vocal tremor, and orthostatic tremor. Psychogenic tremor occurs in both older and younger patients. The key feature of this tremor is that it dramatically lessens  or disappears when the patient is distracted. PROGNOSIS There are some treatment options available for tremor; the appropriate treatment depends on accurate diagnosis of the cause. Some tremors respond to treatment of the underlying condition, for example in some cases of psychogenic tremor treating the patient's underlying mental problem may cause the tremor to disappear. Also, patients with tremor due to Parkinson's disease may be treated with Levodopa drug therapy. Symptomatic drug therapy is available for several other tremors as well. For those cases of tremor in which there is no effective drug treatment, physical measures such as teaching the patient to brace the affected limb during the tremor are sometimes useful. Surgical intervention such as thalamotomy or deep brain stimulation may be useful in certain cases. Document Released: 04/24/2002 Document Revised: 07/27/2011 Document Reviewed: 05/04/2005 [facility] Patient Information 2015 [place], [place]. This information is not intended to replace advice given to you by your health care provider. Make sure you discuss any questions you have with your health care provider.

## 2014-12-14 NOTE — Progress Notes (Signed)
Reason for visit: Tremor  Referring physician: Dr. [name] is a 49 y.o. female  History of present illness:  Alexis [name] is a 49 year old right-handed black female with a history of fibromyalgia, back pain and right leg discomfort, and a history of fibromyalgia. The patient reports a 5 year history of tremor that has been intermittent in nature, with the tremor being more noticeable over the last 3 years, and carefully since January 2016. The patient indicates that the tremor will come and go, and is more prominent on the left than the right. She will have tremor with activity and at rest. She indicates that she has numbness in both hands, particularly in the morning, she will drop things out of the hands as well. The patient feels weak all over, and she has the sensation of numbness on the left face, arm, and leg. She does have some burning and stinging sensations in both feet. She has the diagnosis of diabetes, but she is not on any medications for this. She also has a history of thyroid cancer, status post thyroid resection and she is monitored through an endocrinologist for this, she indicates that they have been keeping her TSH low on medications. The patient feels somewhat staggery, she will fall on occasion. She does report a prominent family history of tremor; her mother, 2 maternal uncles, 1 maternal aunt, and one sister have tremor. She is sent to this office for an evaluation.  Past Medical History  Diagnosis Date  . Hypertension   . GERD (gastroesophageal reflux disease)   . Arthritis   . Asthma   . Dyspnea   . Degenerative disk disease   . Interstitial cystitis   . Endometriosis 01/18/01  . Fibromyalgia   . DJD (degenerative joint disease) of knee   . Right knee meniscal tear   . Sickle cell trait   . History of thyroid cancer     2010--  TOTAL THYROIDECTOMY--  NO RECURRENCE  . History of thyroiditis   . Cervical spondylosis     C5-6  . OSA on CPAP     . Varicose veins   . History of colon polyps   . Hypothyroidism, postsurgical   . Lymphedema   . Glaucoma of both eyes   . Vocal cord paralysis, unilateral complete     RIGHT--  POST TOTAL THYROIDECTOMY  . Chronic colitis   . H/O hiatal hernia   . Complication of anesthesia     has awakened in past/  RIGHT VOCAL CORD PARALYSIS  POST TOTAL THYROIDECTOMY  03-08-2009 ([facility])  . Uterine fibroid   . Bilateral dry eyes   . IBS (irritable bowel syndrome)   . Chronic pain   . Sickle cell trait   . Tremor 12/14/2014  . Diabetic neuropathy, type II diabetes mellitus 12/14/2014    Past Surgical History  Procedure Laterality Date  . Achilles tendon repair Left 2007  . Vein surgery    . Temporal artery biopsy / ligation  2010  . Hydradenitis excision  06/29/2011    Procedure: EXCISION HYDRADENITIS AXILLA;  Surgeon: Hermelinda Dellen, MD;  Location: [facility];  Service: Plastics;;  right breast hydradenitis excioion  . Dx laparoscopy/  pelvic bx's/  lysis adhesions  06-26-2000  . Removal perianal abscess  02-14-2001  . Temporal artery biopsy / ligation Right 06-16-2002  . Cysto/  urethral dilation /  bilateral unroofing ureterocele/  bilateral ureteral stent placement  12-01-2002  .  Cysto/  bilateral retrograde pyelogram/  hydrodistention/  instillation therapy  05-08-2003  . Wide excision mass,  chest area  10-29-2003  . D & c hysteroscopy/  removal iud  12-06-2003  . Dilatation and curettage with suction  05-29-2005    RETAINED PLACENTA  . Cysto/  hydrodistention/  instillation therapy  06-04-2005  &  03-04-2007  . Total thyroidectomy  03-08-2009  . Pilonidal cyst excision  1983  . Tonsillectomy  1990  . Breast enhancement surgery Bilateral 1997  . Laparoscopy abdomen diagnostic  SEPT  2003   [facility]  . Left wrist tendon release  MAY  2007  . Bunionectomy with hammertoe reconstruction and gastroc slide Left FEB  2011    REMOVAL HARDWARE   NOV  2011  . Hydradenitis excision Left 2000  . Colonoscopy with esophagogastroduodenoscopy (egd)  MULTIPLE --  LAST ONE   NOV  2014  . Knee arthroscopy with lateral menisectomy Right 09/08/2013    Procedure: RIGHT KNEE ARTHROSCOPY PARTIAL LATERAL MENISCECTOMY AND DEBRIDEMENT ;  Surgeon: Johnn Hai, MD;  Location: [facility];  Service: Orthopedics;  Laterality: Right;    Family History  Problem Relation Age of Onset  . Cancer Paternal Aunt     breast, colon  . Atrial fibrillation Mother   . Diabetes Mother   . Thyroid disease Mother   . Cushing syndrome Mother   . Tremor Mother   . Atrial fibrillation Father   . Glaucoma Father   . Tremor Sister     Social history:  reports that she has never smoked. She has never used smokeless tobacco. She reports that she drinks alcohol. She reports that she does not use illicit drugs.  Medications:  Prior to Admission medications   Medication Sig Start Date End Date Taking? Authorizing Provider  albuterol (PROVENTIL HFA;VENTOLIN HFA) 108 (90 BASE) MCG/ACT inhaler Inhale 2 puffs into the lungs every 4 (four) hours.    Yes Historical Provider, MD  albuterol (PROVENTIL) (2.5 MG/3ML) 0.083% nebulizer solution Take 2.5 mg by nebulization every 6 (six) hours as needed for wheezing or shortness of breath.   Yes Historical Provider, MD  brimonidine (ALPHAGAN P) 0.1 % SOLN Place 1 drop into both eyes 3 (three) times daily.   Yes Historical Provider, MD  budesonide-formoterol (SYMBICORT) 160-4.5 MCG/ACT inhaler Inhale 1 puff into the lungs 2 (two) times daily.   Yes Historical Provider, MD  cephALEXin (KEFLEX) 500 MG capsule Take 1 capsule (500 mg total) by mouth 4 (four) times daily. 03/16/14  Yes Montine Circle, PA-C  cetirizine (ZYRTEC) 10 MG tablet Take 10 mg by mouth daily.   Yes Historical Provider, MD  cyclobenzaprine (FLEXERIL) 10 MG tablet Take 10 mg by mouth 3 (three) times daily as needed. For muscle spasms.   Yes Historical  Provider, MD  cycloSPORINE (RESTASIS) 0.05 % ophthalmic emulsion Place 1 drop into both eyes 2 (two) times daily.   Yes Historical Provider, MD  dicyclomine (BENTYL) 20 MG tablet Take 20 mg by mouth 4 (four) times daily.    Yes Historical Provider, MD  fluconazole (DIFLUCAN) 150 MG tablet Take 1 tablet (150 mg total) by mouth once. Patient taking differently: Take 150 mg by mouth as needed.  03/16/14  Yes Montine Circle, PA-C  furosemide (LASIX) 40 MG tablet Take 40 mg by mouth daily.   Yes Historical Provider, MD  hydrOXYzine (ATARAX/VISTARIL) 25 MG tablet Take 25 mg by mouth 3 (three) times daily  as needed for anxiety or itching.    Yes Historical Provider, MD  ketotifen (REFRESH EYE ITCH RELIEF) 0.025 % ophthalmic solution Place 1 drop into both eyes daily as needed (dry eyes).    Yes Historical Provider, MD  levothyroxine (SYNTHROID, LEVOTHROID) 125 MCG tablet Take 125 mcg by mouth daily before breakfast.   Yes Historical Provider, MD  lidocaine (XYLOCAINE) 2 % solution Use as directed 5 mLs in the mouth or throat every 6 (six) hours as needed. 11/20/14  Yes Historical Provider, MD  loperamide (IMODIUM) 2 MG capsule Take 1 capsule (2 mg total) by mouth 4 (four) times daily as needed for diarrhea or loose stools. 05/24/13  Yes Varney Biles, MD  nystatin (MYCOSTATIN) 100000 UNIT/ML suspension Use as directed 5 mLs in the mouth or throat 4 (four) times daily.   Yes Historical Provider, MD  omeprazole-sodium bicarbonate (ZEGERID) 40-1100 MG per capsule Take 1 capsule by mouth daily before breakfast.   Yes Historical Provider, MD  ondansetron (ZOFRAN) 4 MG tablet Take 8 mg by mouth every 8 (eight) hours as needed for nausea or vomiting.  07/25/13  Yes Antonietta Breach, PA-C  oxycodone (ROXICODONE) 30 MG immediate release tablet Take 30 mg by mouth every 4 (four) hours as needed for pain.   Yes Historical Provider, MD  oxymorphone (OPANA ER) 20 MG 12 hr tablet Take 20 mg by mouth every 12 (twelve) hours.   Yes  Historical Provider, MD  potassium chloride (K-DUR,KLOR-CON) 10 MEQ tablet Take 10 mEq by mouth daily.    Yes Historical Provider, MD  predniSONE (DELTASONE) 5 MG tablet Take 5 mg by mouth daily with breakfast.   Yes Historical Provider, MD  pregabalin (LYRICA) 75 MG capsule Take 75 mg by mouth daily.   Yes Historical Provider, MD  promethazine (PHENERGAN) 25 MG tablet Take 25 mg by mouth every 6 (six) hours as needed for nausea or vomiting.   Yes Historical Provider, MD  Vitamin D, Ergocalciferol, (DRISDOL) 50000 UNITS CAPS Take 50,000 Units by mouth every 7 (seven) days. Take on Saturdays.   Yes Historical Provider, MD      Allergies  Allergen Reactions  . Adhesive [Tape] Other (See Comments)    SKIN IRRITATION  . Bactrim Nausea And Vomiting  . Clarithromycin Nausea And Vomiting  . Ibuprofen Nausea And Vomiting    SEVERE STOMACH PAIN  . Nexium [Esomeprazole] Other (See Comments)    SIDE EFFECT-- Cramps and headaches  . Nsaids Other (See Comments)    AVOIDS DUE TO GERD  . Sulfa Antibiotics Nausea And Vomiting    ORAL MED.   (PER  , IV,  NO ISSUES)  . Latex Rash and Other (See Comments)    SEVERE ITCHING    ROS:  Out of a complete 14 system review of symptoms, the patient complains only of the following symptoms, and all other reviewed systems are negative.  Tremor, numbness  Blood pressure 122/82, pulse 89, height 5\' 2"  (1.575 m), weight 218 lb 3.2 oz (98.975 kg).  Physical Exam  General: The patient is alert and cooperative at the time of the examination. The patient is moderately obese.  Eyes: Pupils are equal, round, and reactive to light. Discs are flat bilaterally.  Neck: The neck is supple, no carotid bruits are noted.  Respiratory: The respiratory examination is clear.  Cardiovascular: The cardiovascular examination reveals a regular rate and rhythm, no obvious murmurs or rubs are noted.  Skin: Extremities are without significant edema.  Neurologic  Exam  Mental status: The patient is alert and oriented x 3 at the time of the examination. The patient has apparent normal recent and remote memory, with an apparently normal attention span and concentration ability.  Cranial nerves: Facial symmetry is present. There is good sensation of the face to pinprick and soft touch on the right, decreased on the left. The patient splits the midline of the forehead with vibration sensation, decreased on the left. The strength of the facial muscles and the muscles to head turning and shoulder shrug are normal bilaterally. Speech is well enunciated, no aphasia or dysarthria is noted. Extraocular movements are full. Visual fields are full. The tongue is midline, and the patient has symmetric elevation of the soft palate. No obvious hearing deficits are noted.  Motor: The motor testing reveals 5 over 5 strength of all 4 extremities. Good symmetric motor tone is noted throughout.  Sensory: Sensory testing is intact to pinprick, soft touch, vibration sensation, and position sense on all 4 extremities, with exception of decreased vibration sensation and pinprick sensation on the left arm and leg, there is a stocking pattern sensory deficit in the distal third of the legs below the knees bilaterally. No evidence of extinction is noted. Tinel sign at the wrists are minimally positive bilaterally.  Coordination: Cerebellar testing reveals good finger-nose-finger and heel-to-shin bilaterally. A mild intention tremor seen with finger-nose-finger bilaterally, left greater than right.  Gait and station: Gait is normal. Tandem gait is normal. Romberg is negative. No drift is seen.  Reflexes: Deep tendon reflexes are symmetric, but are depressed bilaterally. Toes are downgoing bilaterally.   Assessment/Plan:  1. Tremor  2. Left hemisensory deficit  3. Possible peripheral neuropathy, diabetic  4. Nonorganic neurologic examination  The patient indicates a left sided  hemisensory deficit, but she splits the midline of the forehead with vibration sensation consistent with a nonorganic neurologic examination. The patient will be set up for MRI evaluation of the brain. The patient has a fine action type tremor that does not appear to be completely consistent with an inherited essential tremor, but she offers a very prominent family history of tremor. The patient reports difficulty with dropping things from her hands, numbness of the hands in the morning, the patient may have carpal tunnel syndrome, and she may have a diabetic peripheral neuropathy. Nerve conduction studies will be done on one leg, both arms, and EMG evaluation will be done on the left arm. Blood work may be done in the future. The patient indicates that her endocrinologist keeps her TSH low, it is possible that some of her tremor may be related to her thyroid medication. She will follow-up with EMG evaluation.  Jill Alexanders MD 12/15/2014 12:17 PM  [facility] [address] [place],  [address]  Phone [phone] Fax [phone]

## 2014-12-28 ENCOUNTER — Ambulatory Visit
Admission: RE | Admit: 2014-12-28 | Discharge: 2014-12-28 | Disposition: A | Discharge status: Home / Self Care | Payer: Medicaid Other | Source: Ambulatory Visit | Attending: Neurology | Admitting: Neurology

## 2014-12-28 DIAGNOSIS — E1140 Type 2 diabetes mellitus with diabetic neuropathy, unspecified: Secondary | ICD-10-CM

## 2014-12-28 DIAGNOSIS — M79602 Pain in left arm: Secondary | ICD-10-CM

## 2014-12-28 DIAGNOSIS — R202 Paresthesia of skin: Secondary | ICD-10-CM | POA: Diagnosis not present

## 2014-12-28 DIAGNOSIS — R251 Tremor, unspecified: Secondary | ICD-10-CM | POA: Diagnosis not present

## 2014-12-28 DIAGNOSIS — M79601 Pain in right arm: Secondary | ICD-10-CM

## 2014-12-30 ENCOUNTER — Encounter: Payer: Self-pay | Admitting: Neurology

## 2014-12-30 ENCOUNTER — Telehealth: Payer: Self-pay | Admitting: Neurology

## 2014-12-30 NOTE — Telephone Encounter (Signed)
I called patient. MRI the brain shows very minimal white matter changes. The patient has diabetes, hypertension, and history migraine. The patient indicates that she was recently diagnosed with Sjogren's syndrome. The patient will follow-up for EMG and nerve conduction study evaluation. She reports transient migratory numbness on one arm or one leg, oftentimes on opposite sides of the body.   MRI brain 12/28/2014:  IMPRESSION: This MRI of the brain without contrast shows the following: 1. There are a few scattered T2/FLAIR hyperintense foci in the subcortical white matter of the frontal lobes. This could be idiopathic or due to migraines or to small vessel ischemic changes.  2. The pituitary tissue is thin within a normal sized sella turcica and the optic nerve sheaths are widened. This could be idiopathic but could also be seen with idiopathic intracranial hypertension in the right clinical setting.

## 2015-01-01 ENCOUNTER — Ambulatory Visit (INDEPENDENT_AMBULATORY_CARE_PROVIDER_SITE_OTHER): Payer: Medicaid Other | Admitting: Neurology

## 2015-01-01 ENCOUNTER — Ambulatory Visit (INDEPENDENT_AMBULATORY_CARE_PROVIDER_SITE_OTHER): Payer: Self-pay | Admitting: Neurology

## 2015-01-01 ENCOUNTER — Encounter: Payer: Self-pay | Admitting: Neurology

## 2015-01-01 DIAGNOSIS — R202 Paresthesia of skin: Secondary | ICD-10-CM | POA: Diagnosis not present

## 2015-01-01 DIAGNOSIS — R251 Tremor, unspecified: Secondary | ICD-10-CM

## 2015-01-01 DIAGNOSIS — M79601 Pain in right arm: Secondary | ICD-10-CM

## 2015-01-01 DIAGNOSIS — M79602 Pain in left arm: Secondary | ICD-10-CM

## 2015-01-01 DIAGNOSIS — E1140 Type 2 diabetes mellitus with diabetic neuropathy, unspecified: Secondary | ICD-10-CM

## 2015-01-01 MED ORDER — PREGABALIN 50 MG PO CAPS: ORAL_CAPSULE | ORAL | Status: DC

## 2015-01-01 NOTE — Progress Notes (Signed)
Please refer to EMG and nerve conduction study procedure note. 

## 2015-01-01 NOTE — Progress Notes (Signed)
Alexis [name] is a 49 year old patient with a history of diabetes, fibromyalgia. The patient reports paresthesias on all 4 extremities, some discomfort in the lower extremities bilaterally with right greater than left hip discomfort. The patient comes in for EMG and nerve conduction study evaluation.  Nerve conduction studies on both arms and the left leg were unremarkable, no evidence of a peripheral neuropathy is seen. EMG evaluation of the left arm was normal.  In the past, the patient has been on Lyrica, but she has not had the medication since the spring of 2016. A prescription was rewritten for this with the 50 mg capsules to begin taking 1 daily for 2 weeks, then take 1 twice daily. We will continue to increase the medication as tolerated. The patient will follow-up in 4 or 5 months. The patient has a pre-existing history of fibromyalgia.

## 2015-01-01 NOTE — Procedures (Signed)
     HISTORY:  Alexis [name] is a 49 year old patient with a history of numbness and discomfort on all 4 extremities. The patient carries a diagnosis of fibromyalgia, and she has a history of diabetes. The patient being evaluated for possible neuropathy or a radiculopathy.  NERVE CONDUCTION STUDIES:  Nerve conduction studies were performed on both upper extremities. The distal motor latencies and motor amplitudes for the median and ulnar nerves were within normal limits. The F wave latencies and nerve conduction velocities for these nerves were also normal. The sensory latencies for the median and ulnar nerves were normal.  Nerve conduction studies were performed on the left lower extremity. The distal motor latencies and motor amplitudes for the peroneal and posterior tibial nerves were within normal limits. The nerve conduction velocities for these nerves were also normal. The sensory latency for the peroneal nerve was within normal limits.   EMG STUDIES:  EMG study was performed on the left upper extremity:  The first dorsal interosseous muscle reveals 2 to 4 K units with full recruitment. No fibrillations or positive waves were noted. The abductor pollicis brevis muscle reveals 2 to 4 K units with full recruitment. No fibrillations or positive waves were noted. The extensor indicis proprius muscle reveals 1 to 3 K units with full recruitment. No fibrillations or positive waves were noted. The pronator teres muscle reveals 2 to 3 K units with full recruitment. No fibrillations or positive waves were noted. The biceps muscle reveals 1 to 2 K units with full recruitment. No fibrillations or positive waves were noted. The triceps muscle reveals 2 to 4 K units with full recruitment. No fibrillations or positive waves were noted. The anterior deltoid muscle reveals 2 to 3 K units with full recruitment. No fibrillations or positive waves were noted. The cervical paraspinal muscles were tested  at 2 levels. No abnormalities of insertional activity were seen at either level tested. There was good relaxation.   IMPRESSION:  Nerve conduction studies done on both upper extremities and on the left lower extremity were unremarkable, without evidence of a peripheral neuropathy. EMG evaluation of the left upper extremity was unremarkable, without evidence of an overlying cervical radiculopathy.  Jill Alexanders MD 01/01/2015 11:44 AM  [facility] [address] [place], [place] [address]  Phone [phone] Fax [phone]

## 2015-01-10 ENCOUNTER — Telehealth: Payer: Self-pay | Admitting: Neurology

## 2015-01-10 NOTE — Telephone Encounter (Signed)
Medicaid has changed their formulary, and Lyrica is not a preferred drug.  I called back and spoke with the patient.  Says she has tried and failed two preferred meds, Gabapentin and Topamax.  States Medicaid hasn't actually paid for this drug since 2014, and she has just been taking samples given to her by her PCP.  I contacted Medicaid.  We were able to get an approval effective until 01/03/2016 Ref # [id number].

## 2015-01-10 NOTE — Telephone Encounter (Signed)
Patient called regarding if prior auth for pregabalin (LYRICA) 50 MG capsule was approved. Please call and advise. Patient can be reached at [phone]. She is also inquiring about result from NCV/EMG. She states he went over some results but it was not finalized.

## 2015-01-10 NOTE — Telephone Encounter (Signed)
I called the patient. I told her that according to Dr. Jannifer Franklin' procedure note, the EMG/NCV was normal. She stated that [facility] has not received her Lyrica prior auth. I advised the patient that I would check with Janett Billow to find out if she has any updates on the Lyrica.

## 2015-02-27 ENCOUNTER — Encounter (HOSPITAL_COMMUNITY): Payer: Self-pay | Admitting: Family Medicine

## 2015-02-27 ENCOUNTER — Emergency Department (HOSPITAL_COMMUNITY)
Admission: EM | Admit: 2015-02-27 | Discharge: 2015-02-27 | Disposition: A | Discharge status: Home / Self Care | Payer: Medicaid Other | Attending: Emergency Medicine | Admitting: Emergency Medicine

## 2015-02-27 DIAGNOSIS — K589 Irritable bowel syndrome without diarrhea: Secondary | ICD-10-CM | POA: Diagnosis not present

## 2015-02-27 DIAGNOSIS — L02412 Cutaneous abscess of left axilla: Secondary | ICD-10-CM | POA: Insufficient documentation

## 2015-02-27 DIAGNOSIS — Z79899 Other long term (current) drug therapy: Secondary | ICD-10-CM | POA: Insufficient documentation

## 2015-02-27 DIAGNOSIS — G8929 Other chronic pain: Secondary | ICD-10-CM | POA: Diagnosis not present

## 2015-02-27 DIAGNOSIS — Z7952 Long term (current) use of systemic steroids: Secondary | ICD-10-CM | POA: Diagnosis not present

## 2015-02-27 DIAGNOSIS — E890 Postprocedural hypothyroidism: Secondary | ICD-10-CM | POA: Diagnosis not present

## 2015-02-27 DIAGNOSIS — I10 Essential (primary) hypertension: Secondary | ICD-10-CM | POA: Insufficient documentation

## 2015-02-27 DIAGNOSIS — Z85850 Personal history of malignant neoplasm of thyroid: Secondary | ICD-10-CM | POA: Diagnosis not present

## 2015-02-27 DIAGNOSIS — Z87448 Personal history of other diseases of urinary system: Secondary | ICD-10-CM | POA: Diagnosis not present

## 2015-02-27 DIAGNOSIS — M797 Fibromyalgia: Secondary | ICD-10-CM | POA: Insufficient documentation

## 2015-02-27 DIAGNOSIS — E1140 Type 2 diabetes mellitus with diabetic neuropathy, unspecified: Secondary | ICD-10-CM | POA: Insufficient documentation

## 2015-02-27 DIAGNOSIS — J45909 Unspecified asthma, uncomplicated: Secondary | ICD-10-CM | POA: Insufficient documentation

## 2015-02-27 DIAGNOSIS — Z91040 Latex allergy status: Secondary | ICD-10-CM | POA: Insufficient documentation

## 2015-02-27 DIAGNOSIS — L0291 Cutaneous abscess, unspecified: Secondary | ICD-10-CM

## 2015-02-27 DIAGNOSIS — Z9981 Dependence on supplemental oxygen: Secondary | ICD-10-CM | POA: Diagnosis not present

## 2015-02-27 DIAGNOSIS — K219 Gastro-esophageal reflux disease without esophagitis: Secondary | ICD-10-CM | POA: Insufficient documentation

## 2015-02-27 DIAGNOSIS — G4733 Obstructive sleep apnea (adult) (pediatric): Secondary | ICD-10-CM | POA: Insufficient documentation

## 2015-02-27 DIAGNOSIS — Z862 Personal history of diseases of the blood and blood-forming organs and certain disorders involving the immune mechanism: Secondary | ICD-10-CM | POA: Diagnosis not present

## 2015-02-27 DIAGNOSIS — Z86010 Personal history of colonic polyps: Secondary | ICD-10-CM | POA: Insufficient documentation

## 2015-02-27 DIAGNOSIS — Z792 Long term (current) use of antibiotics: Secondary | ICD-10-CM | POA: Diagnosis not present

## 2015-02-27 DIAGNOSIS — M1990 Unspecified osteoarthritis, unspecified site: Secondary | ICD-10-CM | POA: Diagnosis not present

## 2015-02-27 DIAGNOSIS — Z86018 Personal history of other benign neoplasm: Secondary | ICD-10-CM | POA: Diagnosis not present

## 2015-02-27 MED ORDER — DOXYCYCLINE HYCLATE 100 MG PO CAPS: 100.0000 mg | ORAL_CAPSULE | Freq: Two times a day (BID) | ORAL | Status: DC

## 2015-02-27 MED ORDER — FLUCONAZOLE 150 MG PO TABS: 150.0000 mg | ORAL_TABLET | Freq: Once | ORAL | Status: DC

## 2015-02-27 MED ORDER — LIDOCAINE-EPINEPHRINE (PF) 2 %-1:200000 IJ SOLN
10.0000 mL | Freq: Once | INTRAMUSCULAR | Status: AC
  Administered 2015-02-27: 10 mL
  Filled 2015-02-27: qty 20

## 2015-02-27 NOTE — ED Provider Notes (Signed)
CSN: [id number]     Arrival date & time 02/27/15  1056 History  By signing my name below, I, Alexis [name], attest that this documentation has been prepared under the direction and in the presence of non-physician practitioner, Alexis Pean, PA-C. Electronically Signed: Evelene [name], Scribe. 02/27/2015. 12:19 PM.  Chief Complaint  Patient presents with  . Abscess   The history is provided by the patient. No language interpreter was used.    HPI Comments:  Alexis [name] is a 49 y.o. female with a history of hidradenitis supprativa who presents to the Emergency Department complaining of an abscess under her left axilla first noticed ~ 2 days ago, a few hours after scratching the area. She denies drainage from the site. She has applied warm compresses without relief. She reports associated subjective fever since yesterday. No recent abx use. No alleviating factors noted.  Past Medical History  Diagnosis Date  . Hypertension   . GERD (gastroesophageal reflux disease)   . Arthritis   . Asthma   . Dyspnea   . Degenerative disk disease   . Interstitial cystitis   . Endometriosis 01/18/01  . Fibromyalgia   . DJD (degenerative joint disease) of knee   . Right knee meniscal tear   . Sickle cell trait ([place])   . History of thyroid cancer     2010--  TOTAL THYROIDECTOMY--  NO RECURRENCE  . History of thyroiditis   . Cervical spondylosis     C5-6  . OSA on CPAP   . Varicose veins   . History of colon polyps   . Hypothyroidism, postsurgical   . Lymphedema   . Glaucoma of both eyes   . Vocal cord paralysis, unilateral complete     RIGHT--  POST TOTAL THYROIDECTOMY  . Chronic colitis   . H/O hiatal hernia   . Complication of anesthesia     has awakened in past/  RIGHT VOCAL CORD PARALYSIS  POST TOTAL THYROIDECTOMY  03-08-2009 ([facility])  . Uterine fibroid   . Bilateral dry eyes   . IBS (irritable bowel syndrome)   . Chronic pain   . Sickle cell trait ([place])   . Tremor  12/14/2014  . Diabetic neuropathy, type II diabetes mellitus ([place]) 12/14/2014  . Sjogren's syndrome [facility])    Past Surgical History  Procedure Laterality Date  . Achilles tendon repair Left 2007  . Vein surgery    . Temporal artery biopsy / ligation  2010  . Hydradenitis excision  06/29/2011    Procedure: EXCISION HYDRADENITIS AXILLA;  Surgeon: Hermelinda Dellen, MD;  Location: [place];  Service: Plastics;;  right breast hydradenitis excioion  . Dx laparoscopy/  pelvic bx's/  lysis adhesions  06-26-2000  . Removal perianal abscess  02-14-2001  . Temporal artery biopsy / ligation Right 06-16-2002  . Cysto/  urethral dilation /  bilateral unroofing ureterocele/  bilateral ureteral stent placement  12-01-2002  . Cysto/  bilateral retrograde pyelogram/  hydrodistention/  instillation therapy  05-08-2003  . Wide excision mass,  chest area  10-29-2003  . D & c hysteroscopy/  removal iud  12-06-2003  . Dilatation and curettage with suction  05-29-2005    RETAINED PLACENTA  . Cysto/  hydrodistention/  instillation therapy  06-04-2005  &  03-04-2007  . Total thyroidectomy  03-08-2009  . Pilonidal cyst excision  1983  . Tonsillectomy  1990  . Breast enhancement surgery Bilateral 1997  . Laparoscopy abdomen diagnostic  SEPT  2003  [place] IUD  . Left wrist tendon release  MAY  2007  . Bunionectomy with hammertoe reconstruction and gastroc slide Left FEB  2011    REMOVAL HARDWARE  NOV  2011  . Hydradenitis excision Left 2000  . Colonoscopy with esophagogastroduodenoscopy (egd)  MULTIPLE --  LAST ONE   NOV  2014  . Knee arthroscopy with lateral menisectomy Right 09/08/2013    Procedure: RIGHT KNEE ARTHROSCOPY PARTIAL LATERAL MENISCECTOMY AND DEBRIDEMENT ;  Surgeon: Johnn Hai, MD;  Location: [place];  Service: Orthopedics;  Laterality: Right;   Family History  Problem Relation Age of Onset  . Cancer Paternal Aunt     breast, colon   . Atrial fibrillation Mother   . Diabetes Mother   . Thyroid disease Mother   . Cushing syndrome Mother   . Tremor Mother   . Atrial fibrillation Father   . Glaucoma Father   . Tremor Sister    Social History  Substance Use Topics  . Smoking status: Never Smoker   . Smokeless tobacco: Never Used  . Alcohol Use: 0.0 oz/week    0 Standard drinks or equivalent per week     Comment: very rarely   OB History    Gravida Para Term Preterm AB TAB SAB Ectopic Multiple Living   1 1        1      Review of Systems  Constitutional: Positive for fever (subjective). Negative for chills.  Skin: Positive for rash (Abscess left axilla ). Negative for color change.      Allergies  Adhesive; Bactrim; Clarithromycin; Ibuprofen; Nexium; Nsaids; Sulfa antibiotics; and Latex  Home Medications   Prior to Admission medications   Medication Sig Start Date End Date Taking? Authorizing Provider  albuterol (PROVENTIL HFA;VENTOLIN HFA) 108 (90 BASE) MCG/ACT inhaler Inhale 2 puffs into the lungs every 4 (four) hours.     Historical Provider, MD  albuterol (PROVENTIL) (2.5 MG/3ML) 0.083% nebulizer solution Take 2.5 mg by nebulization every 6 (six) hours as needed for wheezing or shortness of breath.    Historical Provider, MD  brimonidine (ALPHAGAN P) 0.1 % SOLN Place 1 drop into both eyes 3 (three) times daily.    Historical Provider, MD  budesonide-formoterol (SYMBICORT) 160-4.5 MCG/ACT inhaler Inhale 1 puff into the lungs 2 (two) times daily.    Historical Provider, MD  cephALEXin (KEFLEX) 500 MG capsule Take 1 capsule (500 mg total) by mouth 4 (four) times daily. 03/16/14   Montine Circle, PA-C  cetirizine (ZYRTEC) 10 MG tablet Take 10 mg by mouth daily.    Historical Provider, MD  cyclobenzaprine (FLEXERIL) 10 MG tablet Take 10 mg by mouth 3 (three) times daily as needed. For muscle spasms.    Historical Provider, MD  cycloSPORINE (RESTASIS) 0.05 % ophthalmic emulsion Place 1 drop into both eyes 2  (two) times daily.    Historical Provider, MD  dicyclomine (BENTYL) 20 MG tablet Take 20 mg by mouth 4 (four) times daily.     Historical Provider, MD  doxycycline (VIBRAMYCIN) 100 MG capsule Take 1 capsule (100 mg total) by mouth 2 (two) times daily. 02/27/15   Alexis Pean, PA-C  fluconazole (DIFLUCAN) 150 MG tablet Take 1 tablet (150 mg total) by mouth once. 02/27/15   Alexis Pean, PA-C  furosemide (LASIX) 40 MG tablet Take 40 mg by mouth daily.    Historical Provider, MD  hydrOXYzine (ATARAX/VISTARIL) 25 MG tablet Take 25 mg by mouth 3 (  three) times daily as needed for anxiety or itching.     Historical Provider, MD  ketotifen (REFRESH EYE ITCH RELIEF) 0.025 % ophthalmic solution Place 1 drop into both eyes daily as needed (dry eyes).     Historical Provider, MD  levothyroxine (SYNTHROID, LEVOTHROID) 125 MCG tablet Take 125 mcg by mouth daily before breakfast.    Historical Provider, MD  lidocaine (XYLOCAINE) 2 % solution Use as directed 5 mLs in the mouth or throat every 6 (six) hours as needed. 11/20/14   Historical Provider, MD  loperamide (IMODIUM) 2 MG capsule Take 1 capsule (2 mg total) by mouth 4 (four) times daily as needed for diarrhea or loose stools. 05/24/13   Varney Biles, MD  nystatin (MYCOSTATIN) 100000 UNIT/ML suspension Use as directed 5 mLs in the mouth or throat 4 (four) times daily.    Historical Provider, MD  omeprazole-sodium bicarbonate (ZEGERID) 40-1100 MG per capsule Take 1 capsule by mouth daily before breakfast.    Historical Provider, MD  ondansetron (ZOFRAN) 4 MG tablet Take 8 mg by mouth every 8 (eight) hours as needed for nausea or vomiting.  07/25/13   Antonietta Breach, PA-C  oxycodone (ROXICODONE) 30 MG immediate release tablet Take 30 mg by mouth every 4 (four) hours as needed for pain.    Historical Provider, MD  oxymorphone (OPANA ER) 20 MG 12 hr tablet Take 20 mg by mouth every 12 (twelve) hours.    Historical Provider, MD  potassium chloride (K-DUR,KLOR-CON) 10  MEQ tablet Take 10 mEq by mouth daily.     Historical Provider, MD  predniSONE (DELTASONE) 5 MG tablet Take 5 mg by mouth daily with breakfast.    Historical Provider, MD  pregabalin (LYRICA) 50 MG capsule One capsule daily for 2 weeks, then take one capsule twice a day 01/01/15   Kathrynn Ducking, MD  promethazine (PHENERGAN) 25 MG tablet Take 25 mg by mouth every 6 (six) hours as needed for nausea or vomiting.    Historical Provider, MD  Vitamin D, Ergocalciferol, (DRISDOL) 50000 UNITS CAPS Take 50,000 Units by mouth every 7 (seven) days. Take on Saturdays.    Historical Provider, MD   BP 134/82 mmHg  Pulse 92  Temp(Src) 97.9 F (36.6 C) (Oral)  Resp 20  SpO2 98% Physical Exam  Constitutional: She appears well-developed and well-nourished. No distress.  HENT:  Head: Normocephalic and atraumatic.  Eyes: Right eye exhibits no discharge. Left eye exhibits no discharge.  Pulmonary/Chest: Effort normal. No respiratory distress.  Neurological: She is alert. Coordination normal.  Skin: She is not diaphoretic.  3cm fluctant abscess to the left axila No overlying or surrounding erythema   Psychiatric: She has a normal mood and affect. Her behavior is normal.  Nursing note and vitals reviewed.   ED Course  .Marland KitchenIncision and Drainage Date/Time: 02/27/2015 12:30 PM Performed by: Alexis [name] Authorized by: Alexis [name] Consent: Verbal consent obtained. Risks and benefits: risks, benefits and alternatives were discussed Consent given by: patient Patient understanding: patient states understanding of the procedure being performed Patient consent: the patient's understanding of the procedure matches consent given Procedure consent: procedure consent matches procedure scheduled Relevant documents: relevant documents present and verified Test results: test results available and properly labeled Site marked: the operative site was marked Imaging studies: imaging studies available Required  items: required blood products, implants, devices, and special equipment available Patient identity confirmed: verbally with patient Time out: Immediately prior to procedure a "time out" was called to verify the correct  patient, procedure, equipment, support staff and site/side marked as required. Type: abscess Body area: upper extremity (left axialla. ) Anesthesia: local infiltration Local anesthetic: lidocaine 2% with epinephrine Anesthetic total: 2 ml Patient sedated: no Scalpel size: 11 Incision type: single straight Incision depth: dermal Complexity: simple Drainage: purulent Drainage amount: moderate Wound treatment: wound left open Packing material: none Patient tolerance: Patient tolerated the procedure well with no immediate complications     DIAGNOSTIC STUDIES:  Oxygen Saturation is 95% on RA, adequate by my interpretation.   EMERGENCY DEPARTMENT US SOFT TISSUE INTERPRETATION "Study: Limited Ultrasound of the noted body part in comments below"  INDICATIONS: Pain Multiple views of the body part are obtained with a multi-frequency linear probe  PERFORMED BY:  Myself  IMAGES ARCHIVED?: Yes  SIDE:Left  BODY PART:Axilla  FINDINGS: Abcess present  LIMITATIONS:  Body Habitus  INTERPRETATION:  Abcess present  COMMENT:  Soft tissue abscess of left axilla.    COORDINATION OF CARE:  11:59 AM Discussed treatment plan with pt at bedside and pt agreed to plan.    Labs Review Labs Reviewed - No data to display  Imaging Review No results found.    EKG Interpretation None      Filed Vitals:   02/27/15 1110 02/27/15 1239  BP: 138/82 134/82  Pulse: 91 92  Temp: 99 F (37.2 C) 97.9 F (36.6 C)  TempSrc: Oral Oral  Resp: 18 20  SpO2: 95% 98%     MDM   Meds given in ED:  Medications  lidocaine-EPINEPHrine (XYLOCAINE W/EPI) 2 %-1:200000 (PF) injection 10 mL (10 mLs Infiltration Given 02/27/15 1205)    Discharge Medication List as of 02/27/2015  12:34 PM    START taking these medications   Details  doxycycline (VIBRAMYCIN) 100 MG capsule Take 1 capsule (100 mg total) by mouth 2 (two) times daily., Starting 02/27/2015, Until Discontinued, Print        Final diagnoses:  Abscess   This is a 49 year old female with a history of hidradenitis suppurativa who presents to the emergency department complaining of an abscess to her left axilla for the past 2 days. She denies any discharge. She reports subjective fever. On exam she is afebrile nontoxic appearing. Patient has 3 similar fluctuant abscess to her left axilla. No drainage noted. Ultrasound was performed at bedside of her abscess. Incision and drainage performed by me and medical student Glennon Mac and tolerated well by the patient. Copious amount of purulent discharge obtained. We'll discharge with prescriptions for doxycycline and have her follow-up with her primary care provider. Wound care instructions provided. I advised the patient to follow-up with their primary care provider this week. I advised the patient to return to the emergency department with new or worsening symptoms or new concerns. The patient verbalized understanding and agreement with plan.    I, Hanley Hays, personally performed the services described in this documentation. All medical record entries made by the scribe were at my direction and in my presence.  I have reviewed the chart and discharge instructions and agree that the record reflects my personal performance and is accurate and complete. Oliviya Gilkison Duncan.  02/27/2015. 1:12 PM.     Alexis Pean, PA-C 02/27/15 [place], DO 02/27/15 1623

## 2015-02-27 NOTE — Discharge Instructions (Signed)

## 2015-02-27 NOTE — ED Notes (Signed)
Declined W/C at D/C and was escorted to lobby by RN. 

## 2015-02-27 NOTE — ED Notes (Signed)
Pt here for abscess to left axilla area. sts hx of the same. Denies drainage.

## 2015-05-11 ENCOUNTER — Other Ambulatory Visit: Payer: Self-pay | Admitting: Neurology

## 2015-05-14 ENCOUNTER — Other Ambulatory Visit: Payer: Self-pay | Admitting: Neurology

## 2015-05-14 ENCOUNTER — Other Ambulatory Visit: Payer: Self-pay

## 2015-05-14 MED ORDER — PREGABALIN 50 MG PO CAPS: 50.0000 mg | ORAL_CAPSULE | Freq: Two times a day (BID) | ORAL | Status: DC

## 2015-05-14 NOTE — Telephone Encounter (Signed)
Dr Willis is out of the office.  Request entered, forwarded to WID for review.    

## 2015-05-15 ENCOUNTER — Other Ambulatory Visit: Payer: Self-pay

## 2015-06-28 ENCOUNTER — Other Ambulatory Visit: Payer: Self-pay | Admitting: Diagnostic Neuroimaging

## 2015-07-02 NOTE — Telephone Encounter (Signed)
LVM requesting patient call back and schedule FU with DR Jannifer Franklin in order to receive refill on medication. Left this caller's name, number.

## 2015-07-03 ENCOUNTER — Other Ambulatory Visit: Payer: Self-pay

## 2015-07-03 MED ORDER — PREGABALIN 50 MG PO CAPS
50.0000 mg | ORAL_CAPSULE | Freq: Two times a day (BID) | ORAL | Status: DC
Start: 2015-07-03 — End: 2017-05-16

## 2015-09-09 NOTE — H&P (Signed)
OFFICE VISIT NOTES COPIED TO EPIC FOR DOCUMENTATION  . History of Present Illness [name] AGNP-C; 09/08/15 2:16 PM) The patient is a 50 year old female who presents for a Follow-up for Shortness of breath. She has a history of hypertension, diabetes, and hyperlipidemia. She presented for evaluation of worsening dyspnea on exertion and intermittent episodes of chest pain. She states she has been told in the past that she has heart failure, however it's been many years since she has been seen by cardiologist or had any cardiac evaluation. She recently established care with a new PCP and reported these symptoms.   She denies any PND, orthopnea, dizziness, syncope, or symptoms suggestive of claudication or TIA. At her last visit, she was started on Diovan/HCT and scheduled for echo and stress test and presents today for follow up.   Problem List/Past Medical [name]; 2015-09-08 1:42 PM) Shortness of breath (R06.02)  Echocardiogram 08/01/2015: Left ventricle cavity is normal in size. Mild concentric hypertrophy of the left ventricle. Normal global wall motion. Normal diastolic filling pattern. Calculated EF 55%. Left atrial cavity is normal in size. Interatrial septum bulges to the left suggests elevated Right heart pressure. 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 Right atrial cavity is mildly dilated. Right ventricle cavity is mildly dilated. Mildly reduced right ventricular function. Mild tricuspid regurgitation. No evidence of pulmonary hypertension. Compared to the study done on 11/23/2014, ASD is now appreciated and RV is now well seen. Bilateral lower extremity edema (R60.0)  Chest pain, atypical (R07.89)  Lexiscan myoview stress test 07/22/2015: 1. The resting electrocardiogram demonstrated normal sinus rhythm, normal resting conduction and no resting arrhythmias. Stress EKG is non-diagnostic for ischemia as it a pharmacologic  stress using Lexiscan. Stress symptoms included dyspnea. 2. Myocardial perfusion imaging is normal. Overall left ventricular systolic function was normal without regional wall motion abnormalities. The left ventricular ejection fraction was visually normal but calculated at 46%. This is a low risk study. Hyperlipidemia, group A (E78.00)  07/10/2015: Total cholesterol 176, triglycerides 109, HDL 90, LDL 64, creatinine 0.68, CMP normal, PLT 406, CBC otherwise normal Essential hypertension (I10)  Controlled type 2 diabetes mellitus without complication, without long-term current use of insulin (E11.9)  OSA (obstructive sleep apnea) (G47.33)  Not on CPAP Obesity, morbid, BMI 40.0-49.9 (E66.01)  Asthma (J45.909)  Fibromyalgia (M79.7)  Hypothyroidism (E03.9)  Endometriosis (N80.9)  Vitamin D deficiency (E55.9)  CHF (congestive heart failure) (I50.9)  Dysphagia (R13.10)  Dyspepsia (R10.13)  Sickle cell trait (D57.3)  IC (interstitial cystitis) (N30.10)  IBS (irritable bowel syndrome) (K58.9)  Colitis (K52.9)  Neuropathy (G62.9)  Lymphedema (I89.0)  Sjogren's disease (M35.00)   Allergies ([name]; 09/08/2015 1:28 PM) Latex Exam Gloves *MEDICAL DEVICES AND SUPPLIES*  Ibuprofen *ANALGESICS - ANTI-INFLAMMATORY*  Biaxin *MACROLIDES*  Bactrim *ANTI-INFECTIVE AGENTS - MISC.*  NexIUM *ULCER DRUGS*  Lisinopril *ANTIHYPERTENSIVES*  Cough.  Family History [name]; 09-08-15 1:28 PM) Mother  Deceased. at age 83 from ARDS; Mi at age 41, several TIA's, CHF, HTN, thyroid disease, afib with a cardioversion, stent also placed, and poor circulation but no other cardiovascular conditions Father  Deceased. at age 39 from MI complications; no prior MIs, no strokes; atrial fibrillation but no other cardiovascular conditions Sister 1  In good health. 8 yrs older; no known cardiovascular conditions  Social History [name]; 09-08-2015 1:28 PM) Marital status   Single. Living Situation  Lives with relatives. youngest son lives with her Number of Children  2.  Current tobacco use  Never smoker. Non Drinker/No Alcohol Use   Past Surgical History [name]; 08/19/2015 1:28 PM) Cyst Removal 1983 Tonsillectomy 1991 Breast Reduction 09/1995 Dilation and Curettage of Uterus 06/07/2005 Wrist Surgery 09/2005 Ankle Surgery 01/2006 Thyroidectomy; Total 11/06/2008 Bunionectomy 03/2011 Skin Lesion; Local Excision 07/01/2011 Arthroscopic Knee Surgery - Right 09/06/2013  Medication History ([name]; 08/19/2015 1:45 PM) Diovan HCT (160-12.5MG  Tablet, 1 (one) Tablet Oral daily, Taken starting 07/11/2015) Active. OxyCODONE HCl (30MG  Tablet, 1 Oral every 4 to 6 hours as needed for pain) Active. Lyrica (100MG  Capsule, 1 Oral at bedtime) Active. Armour Thyroid (60MG  Tablet, 1 1/2 M,W,F; 1 all other days Oral daily) Active. Atorvastatin Calcium (20MG  Tablet, 1 Oral daily) Active. Cetirizine HCl (10MG  Tablet, 1 Oral daily) Active. Cyclobenzaprine HCl (10MG  Tablet, 1 Oral three times daily as needed) Active. Dicyclomine HCl (20MG  Tablet, 1 Oral four times daily) Active. Azopt (1% Suspension, 1 drop each eye Ophthalmic three times daily) Active. Restasis (0.05% Emulsion, 1 drop each eye Ophthalmic two times daily) Active. HydrOXYzine Pamoate (50MG  Capsule, 1 Oral four times daily) Active. Betagan (0.5% Solution, 1 drop each eye Ophthalmic daily) Active. Omeprazole-Sodium Bicarbonate (40-1100MG  Capsule, 1 Oral daily) Active. Potassium Chloride ER (10MEQ Capsule ER, 1 Oral daily) Active. PredniSONE (20MG  Tablet, 1 Oral daily) Active. Symbicort (160-4.5MCG/ACT Aerosol, 1 puff Inhalation two times daily) Active. Promethazine HCl (25MG  Tablet, 1 Oral as needed) Active. ProAir HFA (108 (90 Base)MCG/ACT Aerosol Soln, 1 to 2 puffs Inhalation as needed) Active. Imodium (2MG  Capsule, 1 Oral as needed) Active. Oxybutynin  Chloride (10MG  Tablet ER, 1 Oral daily) Active. Lidocaine Viscous (2% Solution, use as directed Mouth/Throat) Active. Benicar HCT (20-12.5MG  Tablet, 1 Oral daily) Active. Medications Reconciled (verbally with pt/ some medication and list present)  Diagnostic Studies History [name]; 08/19/2015 1:28 PM) Endoscopy  Renal Doppler 2013 Colonoscopy 2014 Lower Extremity Dopplers 2014 Sleep Study 05/2012 Nuclear stress test 07/22/2015 1. The resting electrocardiogram demonstrated normal sinus rhythm, normal resting conduction and no resting arrhythmias. Stress EKG is non-diagnostic for ischemia as it a pharmacologic stress using Lexiscan. Stress symptoms included dyspnea. 2. Myocardial perfusion imaging is normal. Overall left ventricular systolic function was normal without regional wall motion abnormalities. The left ventricular ejection fraction was visually normal but calculated at 46%. This is a low risk study. Echocardiogram 08/01/2015 Left ventricle cavity is normal in size. Mild concentric hypertrophy of the left ventricle. Normal global wall motion. Normal diastolic filling pattern. Calculated EF 55%. Left atrial cavity is normal in size. Interatrial septum bulges to the left suggests elevated Right heart pressure. 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 Right atrial cavity is mildly dilated. Right ventricle cavity is mildly dilated. Mildly reduced right ventricular function. Mild tricuspid regurgitation. No evidence of pulmonary hypertension. Compared to the study done on 11/23/2014, ASD is now appreciated and RV is now well seen.    Review of Systems [facility] Ebony Hail, [place]; 08/19/2015 3:54 PM) General Not Present- Anorexia, Fatigue and Fever. Respiratory Present- Decreased Exercise Tolerance and Difficulty Breathing on Exertion. Not Present- Cough. Cardiovascular Present- Chest Pain. Not Present- Claudications, Edema,  Orthopnea, Palpitations and Paroxysmal Nocturnal Dyspnea. Gastrointestinal Not Present- Black, Tarry Stool, Change in Bowel Habits and Nausea. Neurological Not Present- Focal Neurological Symptoms and Syncope. Endocrine Not Present- Cold Intolerance, Excessive Sweating, Heat Intolerance and Thyroid Problems. Hematology Not Present- Anemia, Easy Bruising, Petechiae and Prolonged Bleeding.  Vitals [name]; 08/19/2015 1:49 PM) 08/19/2015 1:35 PM Weight: 213.5 lb Height:  62in Body Surface Area: 1.97 m Body Mass Index: 39.05 kg/m  Pulse: 106 (Regular)  P.OX: 98% (Room air) BP: 90/60 (Sitting, Left Arm, Standard)       Physical Exam (Bridgette Ebony Hail, AGNP-C; 08/19/2015 3:54 PM) General Mental Status-Alert. General Appearance-Cooperative and Appears stated age. Build & Nutrition-Moderately built and Morbidly obese.  Head and Neck Thyroid Gland Characteristics - normal size and consistency and no palpable nodules.  Chest and Lung Exam Chest and lung exam reveals -quiet, even and easy respiratory effort with no use of accessory muscles, non-tender and on auscultation, normal breath sounds, no adventitious sounds.  Cardiovascular Cardiovascular examination reveals -normal heart sounds, regular rate and rhythm with no murmurs, carotid auscultation reveals no bruits, abdominal aorta auscultation reveals no bruits and no prominent pulsation, femoral artery auscultation bilaterally reveals normal pulses, no bruits, no thrills, normal pedal pulses bilaterally and no digital clubbing, cyanosis, edema, increased warmth or tenderness(adiposity around ankles).  Abdomen Inspection Contour - Obese. Palpation/Percussion Normal exam - Non Tender and No hepatosplenomegaly.  Neurologic Neurologic evaluation reveals -alert and oriented x 3 with no impairment of recent or remote memory. Motor-Grossly intact without any focal deficits.  Musculoskeletal Global  Assessment Left Lower Extremity - no deformities, masses or tenderness, no known fractures. Right Lower Extremity - no deformities, masses or tenderness, no known fractures.    Assessment & Plan (Bridgette Ebony Hail AGNP-C; 08/19/2015 3:54 PM)  Shortness of breath (R06.02) Story: Echocardiogram 08/01/2015: Left ventricle cavity is normal in size. Mild concentric hypertrophy of the left ventricle. Normal global wall motion. Normal diastolic filling pattern. Calculated EF 55%. Left atrial cavity is normal in size. Interatrial septum bulges to the left suggests elevated Right heart pressure. 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 Right atrial cavity is mildly dilated. Right ventricle cavity is mildly dilated. Mildly reduced right ventricular function. Mild tricuspid regurgitation. No evidence of pulmonary hypertension. Compared to the study done on 11/23/2014, ASD is now appreciated and RV is now well seen.  Impression: EKG 07/11/2015: Sinus rhythm at a rate of 88 bpm, normal axis, normal intervals, poor R-wave progression, cannot exclude anterior infarct old. Diffuse nonspecific T-wave abnormality.  Chest pain, atypical (R07.89) Story: Lexiscan myoview stress test 07/22/2015: 1. The resting electrocardiogram demonstrated normal sinus rhythm, normal resting conduction and no resting arrhythmias. Stress EKG is non-diagnostic for ischemia as it a pharmacologic stress using Lexiscan. Stress symptoms included dyspnea. 2. Myocardial perfusion imaging is normal. Overall left ventricular systolic function was normal without regional wall motion abnormalities. The left ventricular ejection fraction was visually normal but calculated at 46%. This is a low risk study.  ASD (atrial septal defect) (Q21.1)  Hyperlipidemia, group A (E78.00) Story: 07/10/2015: Total cholesterol 176, triglycerides 109, HDL 90, LDL 64, creatinine 0.68, CMP normal, PLT 406, CBC  otherwise normal  Essential hypertension (I10)  Controlled type 2 diabetes mellitus without complication, without long-term current use of insulin (E11.9)  Current Plans Mechanism of underlying disease process and action of medications discussed with the patient. She presents for follow up and reevaluation after undergoing stress test and echocardiogram for SOB and atypical chest pain. Stress test negative for evidence of ischemia. Echocardiogram reveals an ASD with bidirectional shunting and RV dilatation. Given echo findings and symptoms, would recommend TEE to better evaluate the structural abnormality. I have explained risks, benefits, alternatives to TEE, including but not limited to rare esophageal perforation, bleeding, aspiration pneumonia. She is scheduled for an EGD on 09/05/2015 to  evaluate for esophageal stricture. We will schedule TEE after this evaluation has been completed. Follow-up after TEE for reevaluation and further recommendations.  *I have discussed this case with Dr. Einar Gip and he participated in formulating the plan.*  Signed by [name], AGNP-C (08/19/2015 3:55 PM)

## 2015-09-22 NOTE — H&P (Signed)
OFFICE VISIT NOTES COPIED TO EPIC FOR DOCUMENTATION  . History of Present Illness Alexis [name] AGNP-C; 09/08/15 2:16 PM) The patient is a 50 year old female who presents for a Follow-up for Shortness of breath. She has a history of hypertension, diabetes, and hyperlipidemia. She presented for evaluation of worsening dyspnea on exertion and intermittent episodes of chest pain. She states she has been told in the past that she has heart failure, however it's been many years since she has been seen by cardiologist or had any cardiac evaluation. She recently established care with a new PCP and reported these symptoms.   She denies any PND, orthopnea, dizziness, syncope, or symptoms suggestive of claudication or TIA. At her last visit, she was started on Diovan/HCT and scheduled for echo and stress test and presents today for follow up.   Problem List/Past Medical Alexis [name]; 2015-09-08 1:42 PM) Shortness of breath (R06.02)  Echocardiogram 08/01/2015: Left ventricle cavity is normal in size. Mild concentric hypertrophy of the left ventricle. Normal global wall motion. Normal diastolic filling pattern. Calculated EF 55%. Left atrial cavity is normal in size. Interatrial septum bulges to the left suggests elevated Right heart pressure. 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 Right atrial cavity is mildly dilated. Right ventricle cavity is mildly dilated. Mildly reduced right ventricular function. Mild tricuspid regurgitation. No evidence of pulmonary hypertension. Compared to the study done on 11/23/2014, ASD is now appreciated and RV is now well seen. Bilateral lower extremity edema (R60.0)  Chest pain, atypical (R07.89)  Lexiscan myoview stress test 07/22/2015: 1. The resting electrocardiogram demonstrated normal sinus rhythm, normal resting conduction and no resting arrhythmias. Stress EKG is non-diagnostic for ischemia as it a pharmacologic  stress using Lexiscan. Stress symptoms included dyspnea. 2. Myocardial perfusion imaging is normal. Overall left ventricular systolic function was normal without regional wall motion abnormalities. The left ventricular ejection fraction was visually normal but calculated at 46%. This is a low risk study. Hyperlipidemia, group A (E78.00)  07/10/2015: Total cholesterol 176, triglycerides 109, HDL 90, LDL 64, creatinine 0.68, CMP normal, PLT 406, CBC otherwise normal Essential hypertension (I10)  Controlled type 2 diabetes mellitus without complication, without long-term current use of insulin (E11.9)  OSA (obstructive sleep apnea) (G47.33)  Not on CPAP Obesity, morbid, BMI 40.0-49.9 (E66.01)  Asthma (J45.909)  Fibromyalgia (M79.7)  Hypothyroidism (E03.9)  Endometriosis (N80.9)  Vitamin D deficiency (E55.9)  CHF (congestive heart failure) (I50.9)  Dysphagia (R13.10)  Dyspepsia (R10.13)  Sickle cell trait (D57.3)  IC (interstitial cystitis) (N30.10)  IBS (irritable bowel syndrome) (K58.9)  Colitis (K52.9)  Neuropathy (G62.9)  Lymphedema (I89.0)  Sjogren's disease (M35.00)   Allergies (Alexis [name]; 09/08/2015 1:28 PM) Latex Exam Gloves *MEDICAL DEVICES AND SUPPLIES*  Ibuprofen *ANALGESICS - ANTI-INFLAMMATORY*  Biaxin *MACROLIDES*  Bactrim *ANTI-INFECTIVE AGENTS - MISC.*  NexIUM *ULCER DRUGS*  Lisinopril *ANTIHYPERTENSIVES*  Cough.  Family History Alexis [name]; 09-08-15 1:28 PM) Mother  Deceased. at age 83 from ARDS; Mi at age 41, several TIA's, CHF, HTN, thyroid disease, afib with a cardioversion, stent also placed, and poor circulation but no other cardiovascular conditions Father  Deceased. at age 39 from MI complications; no prior MIs, no strokes; atrial fibrillation but no other cardiovascular conditions Sister 1  In good health. 8 yrs older; no known cardiovascular conditions  Social History Alexis [name]; 09-08-2015 1:28 PM) Marital status   Single. Living Situation  Lives with relatives. youngest son lives with her Number of Children  2.  Current tobacco use  Never smoker. Non Drinker/No Alcohol Use   Past Surgical History Alexis [name]; 08/19/2015 1:28 PM) Cyst Removal 1983 Tonsillectomy 1991 Breast Reduction 09/1995 Dilation and Curettage of Uterus 06/07/2005 Wrist Surgery 09/2005 Ankle Surgery 01/2006 Thyroidectomy; Total 11/06/2008 Bunionectomy 03/2011 Skin Lesion; Local Excision 07/01/2011 Arthroscopic Knee Surgery - Right 09/06/2013  Medication History (Alexis [name]; 08/19/2015 1:45 PM) Diovan HCT (160-12.5MG  Tablet, 1 (one) Tablet Oral daily, Taken starting 07/11/2015) Active. OxyCODONE HCl (30MG  Tablet, 1 Oral every 4 to 6 hours as needed for pain) Active. Lyrica (100MG  Capsule, 1 Oral at bedtime) Active. Armour Thyroid (60MG  Tablet, 1 1/2 M,W,F; 1 all other days Oral daily) Active. Atorvastatin Calcium (20MG  Tablet, 1 Oral daily) Active. Cetirizine HCl (10MG  Tablet, 1 Oral daily) Active. Cyclobenzaprine HCl (10MG  Tablet, 1 Oral three times daily as needed) Active. Dicyclomine HCl (20MG  Tablet, 1 Oral four times daily) Active. Azopt (1% Suspension, 1 drop each eye Ophthalmic three times daily) Active. Restasis (0.05% Emulsion, 1 drop each eye Ophthalmic two times daily) Active. HydrOXYzine Pamoate (50MG  Capsule, 1 Oral four times daily) Active. Betagan (0.5% Solution, 1 drop each eye Ophthalmic daily) Active. Omeprazole-Sodium Bicarbonate (40-1100MG  Capsule, 1 Oral daily) Active. Potassium Chloride ER (10MEQ Capsule ER, 1 Oral daily) Active. PredniSONE (20MG  Tablet, 1 Oral daily) Active. Symbicort (160-4.5MCG/ACT Aerosol, 1 puff Inhalation two times daily) Active. Promethazine HCl (25MG  Tablet, 1 Oral as needed) Active. ProAir HFA (108 (90 Base)MCG/ACT Aerosol Soln, 1 to 2 puffs Inhalation as needed) Active. Imodium (2MG  Capsule, 1 Oral as needed) Active. Oxybutynin  Chloride (10MG  Tablet ER, 1 Oral daily) Active. Lidocaine Viscous (2% Solution, use as directed Mouth/Throat) Active. Benicar HCT (20-12.5MG  Tablet, 1 Oral daily) Active. Medications Reconciled (verbally with pt/ some medication and list present)  Diagnostic Studies History Alexis [name]; 08/19/2015 1:28 PM) Endoscopy  Renal Doppler 2013 Colonoscopy 2014 Lower Extremity Dopplers 2014 Sleep Study 05/2012 Nuclear stress test 07/22/2015 1. The resting electrocardiogram demonstrated normal sinus rhythm, normal resting conduction and no resting arrhythmias. Stress EKG is non-diagnostic for ischemia as it a pharmacologic stress using Lexiscan. Stress symptoms included dyspnea. 2. Myocardial perfusion imaging is normal. Overall left ventricular systolic function was normal without regional wall motion abnormalities. The left ventricular ejection fraction was visually normal but calculated at 46%. This is a low risk study. Echocardiogram 08/01/2015 Left ventricle cavity is normal in size. Mild concentric hypertrophy of the left ventricle. Normal global wall motion. Normal diastolic filling pattern. Calculated EF 55%. Left atrial cavity is normal in size. Interatrial septum bulges to the left suggests elevated Right heart pressure. 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 Right atrial cavity is mildly dilated. Right ventricle cavity is mildly dilated. Mildly reduced right ventricular function. Mild tricuspid regurgitation. No evidence of pulmonary hypertension. Compared to the study done on 11/23/2014, ASD is now appreciated and RV is now well seen.    Review of Systems [facility] Ebony Hail, [place]; 08/19/2015 3:54 PM) General Not Present- Anorexia, Fatigue and Fever. Respiratory Present- Decreased Exercise Tolerance and Difficulty Breathing on Exertion. Not Present- Cough. Cardiovascular Present- Chest Pain. Not Present- Claudications, Edema,  Orthopnea, Palpitations and Paroxysmal Nocturnal Dyspnea. Gastrointestinal Not Present- Black, Tarry Stool, Change in Bowel Habits and Nausea. Neurological Not Present- Focal Neurological Symptoms and Syncope. Endocrine Not Present- Cold Intolerance, Excessive Sweating, Heat Intolerance and Thyroid Problems. Hematology Not Present- Anemia, Easy Bruising, Petechiae and Prolonged Bleeding.  Vitals Alexis [name]; 08/19/2015 1:49 PM) 08/19/2015 1:35 PM Weight: 213.5 lb Height:  62in Body Surface Area: 1.97 m Body Mass Index: 39.05 kg/m  Pulse: 106 (Regular)  P.OX: 98% (Room air) BP: 90/60 (Sitting, Left Arm, Standard)       Physical Exam (Bridgette Ebony Hail, AGNP-C; 08/19/2015 3:54 PM) General Mental Status-Alert. General Appearance-Cooperative and Appears stated age. Build & Nutrition-Moderately built and Morbidly obese.  Head and Neck Thyroid Gland Characteristics - normal size and consistency and no palpable nodules.  Chest and Lung Exam Chest and lung exam reveals -quiet, even and easy respiratory effort with no use of accessory muscles, non-tender and on auscultation, normal breath sounds, no adventitious sounds.  Cardiovascular Cardiovascular examination reveals -normal heart sounds, regular rate and rhythm with no murmurs, carotid auscultation reveals no bruits, abdominal aorta auscultation reveals no bruits and no prominent pulsation, femoral artery auscultation bilaterally reveals normal pulses, no bruits, no thrills, normal pedal pulses bilaterally and no digital clubbing, cyanosis, edema, increased warmth or tenderness(adiposity around ankles).  Abdomen Inspection Contour - Obese. Palpation/Percussion Normal exam - Non Tender and No hepatosplenomegaly.  Neurologic Neurologic evaluation reveals -alert and oriented x 3 with no impairment of recent or remote memory. Motor-Grossly intact without any focal deficits.  Musculoskeletal Global  Assessment Left Lower Extremity - no deformities, masses or tenderness, no known fractures. Right Lower Extremity - no deformities, masses or tenderness, no known fractures.  Assessment & Plan (Bridgette Ebony Hail AGNP-C; 08/19/2015 3:54 PM)  Shortness of breath (R06.02) Story: Echocardiogram 08/01/2015: Left ventricle cavity is normal in size. Mild concentric hypertrophy of the left ventricle. Normal global wall motion. Normal diastolic filling pattern. Calculated EF 55%. Left atrial cavity is normal in size. Interatrial septum bulges to the left suggests elevated Right heart pressure. 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 Right atrial cavity is mildly dilated. Right ventricle cavity is mildly dilated. Mildly reduced right ventricular function. Mild tricuspid regurgitation. No evidence of pulmonary hypertension. Compared to the study done on 11/23/2014, ASD is now appreciated and RV is now well seen. Impression: EKG 07/11/2015: Sinus rhythm at a rate of 88 bpm, normal axis, normal intervals, poor R-wave progression, cannot exclude anterior infarct old. Diffuse nonspecific T-wave abnormality. Chest pain, atypical (R07.89) Story: Lexiscan myoview stress test 07/22/2015: 1. The resting electrocardiogram demonstrated normal sinus rhythm, normal resting conduction and no resting arrhythmias. Stress EKG is non-diagnostic for ischemia as it a pharmacologic stress using Lexiscan. Stress symptoms included dyspnea. 2. Myocardial perfusion imaging is normal. Overall left ventricular systolic function was normal without regional wall motion abnormalities. The left ventricular ejection fraction was visually normal but calculated at 46%. This is a low risk study. ASD (atrial septal defect) (Q21.1) Hyperlipidemia, group A (E78.00) Story: 07/10/2015: Total cholesterol 176, triglycerides 109, HDL 90, LDL 64, creatinine 0.68, CMP normal, PLT 406, CBC otherwise  normal Essential hypertension (I10) Controlled type 2 diabetes mellitus without complication, without long-term current use of insulin (E11.9) Current Plans Mechanism of underlying disease process and action of medications discussed with the patient. She presents for follow up and reevaluation after undergoing stress test and echocardiogram for SOB and atypical chest pain. Stress test negative for evidence of ischemia. Echocardiogram reveals an ASD with bidirectional shunting and RV dilatation. Given echo findings and symptoms, would recommend TEE to better evaluate the structural abnormality. I have explained risks, benefits, alternatives to TEE, including but not limited to rare esophageal perforation, bleeding, aspiration pneumonia. She is scheduled for an EGD on 09/05/2015 to evaluate for esophageal stricture. We will schedule TEE after  this evaluation has been completed. Follow-up after TEE for reevaluation and further recommendations.  *I have discussed this case with Dr. Einar Gip and he participated in formulating the plan.*  Signed by Alexis [name], AGNP-C (08/19/2015 3:55 PM)

## 2015-09-26 ENCOUNTER — Ambulatory Visit (HOSPITAL_COMMUNITY)
Admission: RE | Admit: 2015-09-26 | Discharge: 2015-09-26 | Disposition: A | Discharge status: Home / Self Care | Payer: Medicaid Other | Source: Ambulatory Visit | Attending: Cardiology | Admitting: Cardiology

## 2015-09-26 ENCOUNTER — Encounter (HOSPITAL_COMMUNITY)
Admission: RE | Disposition: A | Discharge status: Home / Self Care | Payer: Self-pay | Source: Ambulatory Visit | Attending: Cardiology

## 2015-09-26 ENCOUNTER — Ambulatory Visit (HOSPITAL_COMMUNITY): Payer: Medicaid Other

## 2015-09-26 ENCOUNTER — Encounter (HOSPITAL_COMMUNITY): Payer: Self-pay | Admitting: *Deleted

## 2015-09-26 ENCOUNTER — Ambulatory Visit (HOSPITAL_COMMUNITY): Admit: 2015-09-26 | Payer: Self-pay | Admitting: Cardiology

## 2015-09-26 ENCOUNTER — Ambulatory Visit (HOSPITAL_COMMUNITY): Payer: Medicaid Other | Admitting: Anesthesiology

## 2015-09-26 DIAGNOSIS — Z79891 Long term (current) use of opiate analgesic: Secondary | ICD-10-CM | POA: Diagnosis not present

## 2015-09-26 DIAGNOSIS — M3500 Sicca syndrome, unspecified: Secondary | ICD-10-CM | POA: Insufficient documentation

## 2015-09-26 DIAGNOSIS — E785 Hyperlipidemia, unspecified: Secondary | ICD-10-CM | POA: Insufficient documentation

## 2015-09-26 DIAGNOSIS — Z6838 Body mass index (BMI) 38.0-38.9, adult: Secondary | ICD-10-CM | POA: Insufficient documentation

## 2015-09-26 DIAGNOSIS — K219 Gastro-esophageal reflux disease without esophagitis: Secondary | ICD-10-CM | POA: Insufficient documentation

## 2015-09-26 DIAGNOSIS — I110 Hypertensive heart disease with heart failure: Secondary | ICD-10-CM | POA: Insufficient documentation

## 2015-09-26 DIAGNOSIS — E039 Hypothyroidism, unspecified: Secondary | ICD-10-CM | POA: Insufficient documentation

## 2015-09-26 DIAGNOSIS — Z79899 Other long term (current) drug therapy: Secondary | ICD-10-CM | POA: Diagnosis not present

## 2015-09-26 DIAGNOSIS — N3010 Interstitial cystitis (chronic) without hematuria: Secondary | ICD-10-CM | POA: Insufficient documentation

## 2015-09-26 DIAGNOSIS — G4733 Obstructive sleep apnea (adult) (pediatric): Secondary | ICD-10-CM | POA: Insufficient documentation

## 2015-09-26 DIAGNOSIS — M797 Fibromyalgia: Secondary | ICD-10-CM | POA: Diagnosis not present

## 2015-09-26 DIAGNOSIS — J45909 Unspecified asthma, uncomplicated: Secondary | ICD-10-CM | POA: Insufficient documentation

## 2015-09-26 DIAGNOSIS — E1151 Type 2 diabetes mellitus with diabetic peripheral angiopathy without gangrene: Secondary | ICD-10-CM | POA: Insufficient documentation

## 2015-09-26 DIAGNOSIS — I509 Heart failure, unspecified: Secondary | ICD-10-CM | POA: Insufficient documentation

## 2015-09-26 DIAGNOSIS — E1140 Type 2 diabetes mellitus with diabetic neuropathy, unspecified: Secondary | ICD-10-CM | POA: Diagnosis not present

## 2015-09-26 DIAGNOSIS — E559 Vitamin D deficiency, unspecified: Secondary | ICD-10-CM | POA: Insufficient documentation

## 2015-09-26 DIAGNOSIS — Z7951 Long term (current) use of inhaled steroids: Secondary | ICD-10-CM | POA: Insufficient documentation

## 2015-09-26 DIAGNOSIS — R0600 Dyspnea, unspecified: Secondary | ICD-10-CM | POA: Diagnosis present

## 2015-09-26 DIAGNOSIS — R0602 Shortness of breath: Secondary | ICD-10-CM | POA: Diagnosis present

## 2015-09-26 DIAGNOSIS — Z792 Long term (current) use of antibiotics: Secondary | ICD-10-CM | POA: Diagnosis not present

## 2015-09-26 DIAGNOSIS — Q211 Atrial septal defect: Secondary | ICD-10-CM | POA: Insufficient documentation

## 2015-09-26 DIAGNOSIS — Z7952 Long term (current) use of systemic steroids: Secondary | ICD-10-CM | POA: Insufficient documentation

## 2015-09-26 HISTORY — PX: TEE WITHOUT CARDIOVERSION: SHX5443

## 2015-09-26 SURGERY — CANCELLED PROCEDURE

## 2015-09-26 SURGERY — ECHOCARDIOGRAM, TRANSESOPHAGEAL
Anesthesia: Monitor Anesthesia Care

## 2015-09-26 MED ORDER — PROPOFOL 500 MG/50ML IV EMUL
INTRAVENOUS | Status: DC | PRN
  Administered 2015-09-26: 100 ug/kg/min via INTRAVENOUS

## 2015-09-26 MED ORDER — SODIUM CHLORIDE 0.9 % IV SOLN: INTRAVENOUS | Status: DC

## 2015-09-26 MED ORDER — PROPOFOL 10 MG/ML IV BOLUS
INTRAVENOUS | Status: DC | PRN
  Administered 2015-09-26 (×3): 20 mg via INTRAVENOUS

## 2015-09-26 MED ORDER — SODIUM CHLORIDE 0.9 % IV SOLN
INTRAVENOUS | Status: DC
  Administered 2015-09-26: 08:00:00 via INTRAVENOUS

## 2015-09-26 NOTE — CV Procedure (Signed)
TEE: Under moderate sedation, TEE was performed without complications: LV: Normal. Normal EF. RV: Normal LA: Normal. Left atrial appendage: Normal without thrombus. Inter atrial septum shows a very tiny fenestrated ASD with left to right shunting only by color Doppler, Double contrast study negative for atrial level shunting. No late appearance of bubbles either. This suggests there is no significant ASD. RA: Normal  MV: Normal Trace MR. TV: Normal Trace TR. No pulmonary hypertension AV: Normal. No AI or AS. PV: Normal. Trace PI.  Thoracic and ascending aorta: Normal without significant plaque or atheromatous changes.  Essentially normal TEE.

## 2015-09-26 NOTE — Discharge Instructions (Signed)
Transesophageal Echocardiogram °Transesophageal echocardiography (TEE) is a picture test of your heart using sound waves. The pictures taken can give very detailed pictures of your heart. This can help your doctor see if there are problems with your heart. TEE can check: °· If your heart has blood clots in it. °· How well your heart valves are working. °· If you have an infection on the inside of your heart. °· Some of the major arteries of your heart. °· If your heart valve is working after a repair. °· Your heart before a procedure that uses a shock to your heart to get the rhythm back to normal. °BEFORE THE PROCEDURE °· Do not eat or drink for 6 hours before the procedure or as told by your doctor. °· Make plans to have someone drive you home after the procedure. Do not drive yourself home. °· An IV tube will be put in your arm. °PROCEDURE °· You will be given a medicine to help you relax (sedative). It will be given through the IV tube. °· A numbing medicine will be sprayed or gargled in the back of your throat to help numb it. °· The tip of the probe is placed into the back of your mouth. You will be asked to swallow. This helps to pass the probe into your esophagus. °· Once the tip of the probe is in the right place, your doctor can take pictures of your heart. °· You may feel pressure at the back of your throat. °AFTER THE PROCEDURE °· You will be taken to a recovery area so the sedative can wear off. °· Your throat may be sore and scratchy. This will go away slowly over time. °· You will go home when you are fully awake and able to swallow liquids. °· You should have someone stay with you for the next 24 hours. °· Do not drive or operate machinery for the next 24 hours. °  °This information is not intended to replace advice given to you by your health care provider. Make sure you discuss any questions you have with your health care provider. °  °Document Released: 03/01/2009 Document Revised: 05/09/2013  Document Reviewed: 11/03/2012 °Elsevier Interactive Patient Education ©2016 [employer] ° ° ° °Moderate Conscious Sedation, Adult, Care After °Refer to this sheet in the next few weeks. These instructions provide you with information on caring for yourself after your procedure. Your health care provider may also give you more specific instructions. Your treatment has been planned according to current medical practices, but problems sometimes occur. Call your health care provider if you have any problems or questions after your procedure. °WHAT TO EXPECT AFTER THE PROCEDURE  °After your procedure: °· You may feel sleepy, clumsy, and have poor balance for several hours. °· Vomiting may occur if you eat too soon after the procedure. °HOME CARE INSTRUCTIONS °· Do not participate in any activities where you could become injured for at least 24 hours. Do not: °¨ Drive. °¨ Swim. °¨ Ride a bicycle. °¨ Operate heavy machinery. °¨ Cook. °¨ Use power tools. °¨ Climb ladders. °¨ Work from a high place. °· Do not make important decisions or sign legal documents until you are improved. °· If you vomit, drink water, juice, or soup when you can drink without vomiting. Make sure you have little or no nausea before eating solid foods. °· Only take over-the-counter or prescription medicines for pain, discomfort, or fever as directed by your health care provider. °· Make sure you   and your family fully understand everything about the medicines given to you, including what side effects may occur. °· You should not drink alcohol, take sleeping pills, or take medicines that cause drowsiness for at least 24 hours. °· If you smoke, do not smoke without supervision. °· If you are feeling better, you may resume normal activities 24 hours after you were sedated. °· Keep all appointments with your health care provider. °SEEK MEDICAL CARE IF: °· Your skin is pale or bluish in color. °· You continue to feel nauseous or vomit. °· Your pain is  getting worse and is not helped by medicine. °· You have bleeding or swelling. °· You are still sleepy or feeling clumsy after 24 hours. °SEEK IMMEDIATE MEDICAL CARE IF: °· You develop a rash. °· You have difficulty breathing. °· You develop any type of allergic problem. °· You have a fever. °MAKE SURE YOU: °· Understand these instructions. °· Will watch your condition. °· Will get help right away if you are not doing well or get worse. °  °This information is not intended to replace advice given to you by your health care provider. Make sure you discuss any questions you have with your health care provider. °  °Document Released: 02/22/2013 Document Revised: 05/25/2014 Document Reviewed: 02/22/2013 °Elsevier Interactive Patient Education ©2016 [employer] ° °

## 2015-09-26 NOTE — Interval H&P Note (Signed)
History and Physical Interval Note:  09/26/2015 8:08 AM  Alexis [name]  has presented today for surgery, with the diagnosis of dyspnea  The various methods of treatment have been discussed with the patient and family. After consideration of risks, benefits and other options for treatment, the patient has consented to  Procedure(s): TRANSESOPHAGEAL ECHOCARDIOGRAM (TEE) (N/A) as a surgical intervention .  The patient's history has been reviewed, patient examined, no change in status, stable for surgery.  I have reviewed the patient's chart and labs.  Questions were answered to the patient's satisfaction.     Adrian Prows

## 2015-09-26 NOTE — Anesthesia Preprocedure Evaluation (Signed)
Anesthesia Evaluation  Patient identified by MRN, date of birth, ID band Patient awake    Reviewed: Allergy & Precautions, NPO status , Patient's Chart, lab work & pertinent test results  History of Anesthesia Complications Negative for: history of anesthetic complications  Airway Mallampati: III  TM Distance: >3 FB Neck ROM: Full    Dental  (+) Teeth Intact   Pulmonary shortness of breath and with exertion, asthma , sleep apnea and Continuous Positive Airway Pressure Ventilation ,    breath sounds clear to auscultation       Cardiovascular hypertension, Pt. on medications (-) angina+ Peripheral Vascular Disease  (-) Past MI  Rhythm:Regular  Asd, sob   Neuro/Psych  Neuromuscular disease    GI/Hepatic Neg liver ROS, hiatal hernia, GERD  Medicated and Poorly Controlled,  Endo/Other  diabetes, Type 2Hypothyroidism   Renal/GU negative Renal ROS     Musculoskeletal  (+) Arthritis , Fibromyalgia -  Abdominal   Peds  Hematology   Anesthesia Other Findings   Reproductive/Obstetrics                             Anesthesia Physical Anesthesia Plan  ASA: III  Anesthesia Plan: MAC   Post-op Pain Management:    Induction: Intravenous  Airway Management Planned: Natural Airway, Nasal Cannula and Simple Face Mask  Additional Equipment: None  Intra-op Plan:   Post-operative Plan:   Informed Consent: I have reviewed the patients History and Physical, chart, labs and discussed the procedure including the risks, benefits and alternatives for the proposed anesthesia with the patient or authorized representative who has indicated his/her understanding and acceptance.   Dental advisory given  Plan Discussed with: CRNA and Surgeon  Anesthesia Plan Comments:         Anesthesia Quick Evaluation

## 2015-09-26 NOTE — Progress Notes (Signed)
  Echocardiogram Echocardiogram Transesophageal has been performed.  Alexis [name] 09/26/2015, 8:48 AM

## 2015-09-26 NOTE — Transfer of Care (Signed)
Immediate Anesthesia Transfer of Care Note  Patient: Alexis [name]  Procedure(s) Performed: Procedure(s): TRANSESOPHAGEAL ECHOCARDIOGRAM (TEE) (N/A)  Patient Location: Endoscopy Unit  Anesthesia Type:MAC  Level of Consciousness: awake, oriented and patient cooperative  Airway & Oxygen Therapy: Patient Spontanous Breathing and Patient connected to nasal cannula oxygen  Post-op Assessment: Report given to RN and Post -op Vital signs reviewed and stable  Post vital signs: Reviewed and stable  Last Vitals:  Filed Vitals:   09/26/15 0739  BP: 114/81  Pulse: 98  Temp: 36.6 C  Resp: 15    Last Pain: There were no vitals filed for this visit.       Complications: No apparent anesthesia complications

## 2015-09-26 NOTE — Anesthesia Postprocedure Evaluation (Signed)
Anesthesia Post Note  Patient: Alexis [name]  Procedure(s) Performed: Procedure(s) (LRB): TRANSESOPHAGEAL ECHOCARDIOGRAM (TEE) (N/A)  Patient location during evaluation: Endoscopy Anesthesia Type: MAC Level of consciousness: awake Pain management: pain level controlled Vital Signs Assessment: post-procedure vital signs reviewed and stable Respiratory status: spontaneous breathing Cardiovascular status: stable Postop Assessment: no signs of nausea or vomiting Anesthetic complications: no    Last Vitals:  Filed Vitals:   09/26/15 0840 09/26/15 0850  BP: 104/49 96/62  Pulse: 91 87  Temp:    Resp: 9 16    Last Pain: There were no vitals filed for this visit.               Glendal Cassaday

## 2015-09-27 ENCOUNTER — Encounter (HOSPITAL_COMMUNITY): Payer: Self-pay | Admitting: Cardiology

## 2015-10-27 NOTE — H&P (Signed)
OFFICE VISIT NOTES COPIED TO EPIC FOR DOCUMENTATION  . History of Present Illness Alexis Page MD; 10/17/2015 5:48 PM) Patient words: Last O/V 08/19/15; 7-10 day F/U for TEE; she states she is still having shortness of breath, dizzy spells, swelling in both legs and is having a right temporal headache.  The patient is a 50 year old female who presents for a follow-up for Shortness of breath. She has a history of hypertension, diabetes, and hyperlipidemia. She presented for evaluation of worsening dyspnea on exertion and intermittent episodes of chest pain. Pharmacologic Stress test on 07/22/2015 did not reveal any evidence of ischemia, EF was low normal at 46%. Echocardiogram on 08/01/15 had revealed possibility of mild RV dilatation and probable ASD. Normal LV systolic function. She underwent TEE on 09/26/2015 revealing no intracardiac shunting. Bubble study was negative for right-to-left shunting in general. She now presents here for follow-up. Continues to complain of marked dyspnea on exertion and states that she has not had any answers of why she continues to have shortness of breath. She still has sharp chest pains in the middle of the chest but mostly nonexertional.  She also complains of leg edema, has been evaluated by Dr. Trula Slade in the past and felt that it was lipedemia, no evidence of venous insufficiency in 2014.  She denies any PND, orthopnea, dizziness, syncope, or symptoms suggestive of claudication or TIA.   Problem List/Past Medical Alexis [name]; Oct 17, 2015 11:49 AM) Shortness of breath (R06.02)  Echocardiogram 08/01/2015: Left ventricle cavity is normal in size. Mild concentric hypertrophy of the left ventricle. Normal global wall motion. Normal diastolic filling pattern. Calculated EF 55%. Left atrial cavity is normal in size. Interatrial septum bulges to the left suggests elevated Right heart pressure. 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 Right atrial cavity is mildly dilated. Right ventricle cavity is mildly dilated. Mildly reduced right ventricular function. Mild tricuspid regurgitation. No evidence of pulmonary hypertension. Compared to the study done on 11/23/2014, ASD is now appreciated and RV is now well seen. Bilateral lower extremity edema (R60.0)  Chest pain, atypical (R07.89)  Lexiscan myoview stress test 07/22/2015: 1. The resting electrocardiogram demonstrated normal sinus rhythm, normal resting conduction and no resting arrhythmias. Stress EKG is non-diagnostic for ischemia as it a pharmacologic stress using Lexiscan. Stress symptoms included dyspnea. 2. Myocardial perfusion imaging is normal. Overall left ventricular systolic function was normal without regional wall motion abnormalities. The left ventricular ejection fraction was visually normal but calculated at 46%. This is a low risk study. Hyperlipidemia, group A (E78.00)  Essential hypertension (I10)  Controlled type 2 diabetes mellitus without complication, without long-term current use of insulin (E11.9)  OSA (obstructive sleep apnea) (G47.33)  Not on CPAP, having issues with supplies Obesity, morbid, BMI 40.0-49.9 (E66.01)  Asthma (J45.909)  Fibromyalgia (M79.7)  Hypothyroidism (E03.9)  Endometriosis (N80.9)  Vitamin D deficiency (E55.9)  Dysphagia (R13.10)  Dyspepsia (R10.13)  Sickle cell trait (D57.3)  IC (interstitial cystitis) (N30.10)  IBS (irritable bowel syndrome) (K58.9)  Colitis (K52.9)  Neuropathy (G62.9)  Lymphedema (I89.0)  Sjogren's disease (M35.00)   Allergies Alexis [name]; 10/17/2015 11:14 AM) Latex Exam Gloves *MEDICAL DEVICES AND SUPPLIES*  Ibuprofen *ANALGESICS - ANTI-INFLAMMATORY*  Biaxin *MACROLIDES*  Bactrim *ANTI-INFECTIVE AGENTS - MISC.*  NexIUM *ULCER DRUGS*  Lisinopril *ANTIHYPERTENSIVES*  Cough.  Family History Alexis [name]; October 17, 2015 11:14 AM) Mother  Deceased. at age  42 from ARDS; Mi at age 22, several TIA's, CHF, HTN, thyroid disease, afib  with a cardioversion, stent also placed, and poor circulation but no other cardiovascular conditions Father  Deceased. at age 56 from MI complications; no prior MIs, no strokes; atrial fibrillation but no other cardiovascular conditions Sister 1  In good health. 8 yrs older; no known cardiovascular conditions  Social History Alexis [name]; 10/09/2015 11:14 AM) Marital status  Single. Living Situation  Lives with relatives. youngest son lives with her Number of Children  2. Current tobacco use  Never smoker. Non Drinker/No Alcohol Use   Past Surgical History Alexis [name]; 10/09/2015 11:14 AM) Cyst Removal 1983 Tonsillectomy 1991 Breast Reduction 09/1995 Dilation and Curettage of Uterus 06/07/2005 Wrist Surgery 09/2005 Ankle Surgery 01/2006 Thyroidectomy; Total 11/06/2008 Bunionectomy 03/2011 Skin Lesion; Local Excision 07/01/2011 Arthroscopic Knee Surgery - Right 09/06/2013  Medication History Alexis [name]; 10/09/2015 11:34 AM) OxyCODONE HCl (30MG  Tablet, 1 Oral every 4 to 6 hours as needed for pain) Active. Lyrica (100MG  Capsule, 1 Oral at bedtime) Active. Armour Thyroid (60MG  Tablet, 1 1/2 M,W,F; 1 all other days Oral daily) Active. Atorvastatin Calcium (20MG  Tablet, 1 Oral daily) Active. Cetirizine HCl (10MG  Tablet, 1 Oral daily) Active. Cyclobenzaprine HCl (10MG  Tablet, 1 Oral three times daily as needed) Active. Dicyclomine HCl (20MG  Tablet, 1 Oral four times daily) Active. Azopt (1% Suspension, 1 drop each eye Ophthalmic three times daily) Active. Restasis (0.05% Emulsion, 1 drop each eye Ophthalmic two times daily) Active. HydrOXYzine Pamoate (50MG  Capsule, 1 Oral four times daily) Active. Betagan (0.5% Solution, 1 drop each eye Ophthalmic) Active. (as needed) Omeprazole-Sodium Bicarbonate (40-1100MG  Capsule, 1 Oral daily) Active. Potassium Chloride ER (10MEQ Capsule ER, 1  Oral daily) Active. Symbicort (160-4.5MCG/ACT Aerosol, 1 puff Inhalation two times daily) Active. Promethazine HCl (25MG  Tablet, 1 Oral as needed) Active. ProAir HFA (108 (90 Base)MCG/ACT Aerosol Soln, 1 to 2 puffs Inhalation as needed) Active. Imodium (2MG  Capsule, 1 Oral as needed) Active. Oxybutynin Chloride (10MG  Tablet ER, 1 Oral daily) Active. Lidocaine Viscous (2% Solution, use as directed Mouth/Throat) Active. Benicar HCT (20-12.5MG  Tablet, 1/2 Oral daily) Active. (08/20/2015-decreased from 1/qd due to low BP) Medications Reconciled (verbally with patient)  Diagnostic Studies History Alexis [name]; 10/09/2015 5:50 PM) Endoscopy  Labwork  08/19/2015: creatinine 1.0, potassium 4.3, BMP normal 07/10/2015: Total cholesterol 176, triglycerides 109, HDL 90, LDL 64, creatinine 0.68, CMP normal, PLT 406, CBC otherwise normal Renal Doppler 2013 Colonoscopy 2014 Lower Extremity Dopplers 2014 Sleep Study 05/2012 Nuclear stress test 07/22/2015 1. The resting electrocardiogram demonstrated normal sinus rhythm, normal resting conduction and no resting arrhythmias. Stress EKG is non-diagnostic for ischemia as it a pharmacologic stress using Lexiscan. Stress symptoms included dyspnea. 2. Myocardial perfusion imaging is normal. Overall left ventricular systolic function was normal without regional wall motion abnormalities. The left ventricular ejection fraction was visually normal but calculated at 46%. This is a low risk study. Echocardiogram 08/01/2015 Left ventricle cavity is normal in size. Mild concentric hypertrophy of the left ventricle. Normal global wall motion. Normal diastolic filling pattern. Calculated EF 55%. Left atrial cavity is normal in size. Interatrial septum bulges to the left suggests elevated Right heart pressure. 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 Right atrial cavity is mildly dilated. Right ventricle cavity is mildly  dilated. Mildly reduced right ventricular function. Mild tricuspid regurgitation. No evidence of pulmonary hypertension. Compared to the study done on 11/23/2014, ASD is now appreciated and RV is now well seen. TEE 09/26/2015 - Left ventricle: Systolic function was normal. Wall motion was normal; there were  no regional wall motion abnormalities. - Left atrium: No evidence of thrombus in the atrial cavity or appendage. - Right atrium: No evidence of thrombus in the atrial cavity or appendage. - Atrial septum: There was a possible, small fenestrated pinhole ASD by color Doppler only There was no atrial level shunt. - Impressions: Normal study.    Review of Systems Alexis Page MD; 10/09/2015 5:49 PM) General Not Present- Anorexia, Fatigue and Fever. Respiratory Present- Decreased Exercise Tolerance and Difficulty Breathing on Exertion. Not Present- Cough. Cardiovascular Present- Chest Pain. Not Present- Claudications, Edema, Orthopnea, Palpitations and Paroxysmal Nocturnal Dyspnea. Gastrointestinal Not Present- Black, Tarry Stool, Change in Bowel Habits and Nausea. Neurological Present- Headaches. Not Present- Focal Neurological Symptoms and Syncope. Endocrine Not Present- Cold Intolerance, Excessive Sweating, Heat Intolerance and Thyroid Problems. Hematology Not Present- Anemia, Easy Bruising, Petechiae and Prolonged Bleeding.  Vitals Alexis Grayer [place]; 10/09/2015 11:36 AM) 10/09/2015 11:17 AM Weight: 217.19 lb Height: 62in Body Surface Area: 1.98 m Body Mass Index: 39.72 kg/m  Pulse: 100 (Regular)  P.OX: 99% (Room air) BP: 102/72 (Sitting, Left Arm, Standard)       Physical Exam Alexis Page, MD; 10/09/2015 5:50 PM) General Mental Status-Alert. General Appearance-Cooperative and Appears stated age. Build & Nutrition-Moderately built and Morbidly obese.  Head and Neck Thyroid Gland Characteristics - normal size and consistency and no palpable nodules.  Chest  and Lung Exam Chest and lung exam reveals -quiet, even and easy respiratory effort with no use of accessory muscles, non-tender and on auscultation, normal breath sounds, no adventitious sounds.  Cardiovascular Cardiovascular examination reveals -normal heart sounds, regular rate and rhythm with no murmurs, carotid auscultation reveals no bruits, abdominal aorta auscultation reveals no bruits and no prominent pulsation, femoral artery auscultation bilaterally reveals normal pulses, no bruits, no thrills, normal pedal pulses bilaterally and no digital clubbing, cyanosis, edema, increased warmth or tenderness(adiposity around ankles).  Abdomen Inspection Contour - Obese. Palpation/Percussion Normal exam - Non Tender and No hepatosplenomegaly.  Neurologic Neurologic evaluation reveals -alert and oriented x 3 with no impairment of recent or remote memory. Motor-Grossly intact without any focal deficits.  Musculoskeletal Global Assessment Left Lower Extremity - no deformities, masses or tenderness, no known fractures. Right Lower Extremity - no deformities, masses or tenderness, no known fractures.    Assessment & Plan Alexis Page MD; 10/09/2015 5:50 PM) Shortness of breath (R06.02) Story: Echocardiogram 08/01/2015: Left ventricle cavity is normal in size. Mild concentric hypertrophy of the left ventricle. Normal global wall motion. Normal diastolic filling pattern. Calculated EF 55%. Left atrial cavity is normal in size. Interatrial septum bulges to the left suggests elevated Right heart pressure. 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 TEE 09/26/2015: NO ASD Right atrial cavity is mildly dilated. Right ventricle cavity is mildly dilated. Mildly reduced right ventricular function. Mild tricuspid regurgitation. No evidence of pulmonary hypertension. Compared to the study done on 11/23/2014, ASD is now appreciated and RV is now  well seen. Impression: EKG 07/11/2015: Sinus rhythm at a rate of 88 bpm, normal axis, normal intervals, poor R-wave progression, cannot exclude anterior infarct old. Diffuse nonspecific T-wave abnormality. Future Plans [id number]: METABOLIC PANEL, BASIC (99991111) - one time 10/17/2015: CBC & PLATELETS (AUTO) [id number]) - one time 10/17/2015: PT (PROTHROMBIN TIME) (60454) - one time Obesity, morbid, BMI 40.0-49.9 (E66.01) Controlled type 2 diabetes mellitus without complication, without long-term current use of insulin (E11.9)   Current Plans Mechanism of underlying disease process and action of  medications discussed with the patient. She presents for follow up and reevaluation after undergoing stress test and echocardiogram for SOB and atypical chest pain. Stress test negative for evidence of ischemia. 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  I'm not very sure of the etiology for her dyspnea, only option left is to perform right heart catheterization to exclude pulmonary hypertension. Patient is willing to proceed with the same. Clinically I suspect she probably has significant deconditioning with obesity hypoventilation. I advised her to be compliant with CPAP.  Advised her to keep her appointment with the GI. She was scheduled for evaluation of esophageal stricture, but could not keep up appointment.    Signed by Alexis Page, MD (10/09/2015 5:51 PM)

## 2015-10-28 ENCOUNTER — Encounter (HOSPITAL_COMMUNITY): Payer: Self-pay | Admitting: Cardiology

## 2015-10-28 ENCOUNTER — Encounter (HOSPITAL_COMMUNITY)
Admission: RE | Disposition: A | Discharge status: Home / Self Care | Payer: Self-pay | Source: Ambulatory Visit | Attending: Cardiology

## 2015-10-28 ENCOUNTER — Ambulatory Visit (HOSPITAL_COMMUNITY)
Admission: RE | Admit: 2015-10-28 | Discharge: 2015-10-28 | Disposition: A | Discharge status: Home / Self Care | Payer: Medicaid Other | Source: Ambulatory Visit | Attending: Cardiology | Admitting: Cardiology

## 2015-10-28 DIAGNOSIS — R600 Localized edema: Secondary | ICD-10-CM | POA: Insufficient documentation

## 2015-10-28 DIAGNOSIS — D573 Sickle-cell trait: Secondary | ICD-10-CM | POA: Insufficient documentation

## 2015-10-28 DIAGNOSIS — Z6838 Body mass index (BMI) 38.0-38.9, adult: Secondary | ICD-10-CM | POA: Diagnosis not present

## 2015-10-28 DIAGNOSIS — E039 Hypothyroidism, unspecified: Secondary | ICD-10-CM | POA: Diagnosis not present

## 2015-10-28 DIAGNOSIS — G4733 Obstructive sleep apnea (adult) (pediatric): Secondary | ICD-10-CM | POA: Insufficient documentation

## 2015-10-28 DIAGNOSIS — M797 Fibromyalgia: Secondary | ICD-10-CM | POA: Diagnosis not present

## 2015-10-28 DIAGNOSIS — R0789 Other chest pain: Secondary | ICD-10-CM | POA: Insufficient documentation

## 2015-10-28 DIAGNOSIS — E1140 Type 2 diabetes mellitus with diabetic neuropathy, unspecified: Secondary | ICD-10-CM | POA: Insufficient documentation

## 2015-10-28 DIAGNOSIS — I10 Essential (primary) hypertension: Secondary | ICD-10-CM | POA: Diagnosis not present

## 2015-10-28 DIAGNOSIS — R0609 Other forms of dyspnea: Secondary | ICD-10-CM | POA: Insufficient documentation

## 2015-10-28 DIAGNOSIS — E785 Hyperlipidemia, unspecified: Secondary | ICD-10-CM | POA: Insufficient documentation

## 2015-10-28 DIAGNOSIS — M3500 Sicca syndrome, unspecified: Secondary | ICD-10-CM | POA: Insufficient documentation

## 2015-10-28 DIAGNOSIS — J45909 Unspecified asthma, uncomplicated: Secondary | ICD-10-CM | POA: Insufficient documentation

## 2015-10-28 DIAGNOSIS — R0602 Shortness of breath: Secondary | ICD-10-CM | POA: Diagnosis present

## 2015-10-28 HISTORY — PX: CARDIAC CATHETERIZATION: SHX172

## 2015-10-28 LAB — POCT I-STAT 3, VENOUS BLOOD GAS (G3P V)
Acid-Base Excess: 3 mmol/L — ABNORMAL HIGH (ref 0.0–2.0)
Bicarbonate: 27.9 mEq/L — ABNORMAL HIGH (ref 20.0–24.0)
O2 Saturation: 78 %
TCO2: 29 mmol/L (ref 0–100)
pCO2, Ven: 45.1 mmHg (ref 45.0–50.0)
pH, Ven: 7.4 — ABNORMAL HIGH (ref 7.250–7.300)
pO2, Ven: 43 mmHg (ref 31.0–45.0)

## 2015-10-28 SURGERY — RIGHT HEART CATH

## 2015-10-28 MED ORDER — SODIUM CHLORIDE 0.9% FLUSH: 3.0000 mL | INTRAVENOUS | Status: DC | PRN

## 2015-10-28 MED ORDER — SODIUM CHLORIDE 0.9 % IV SOLN
INTRAVENOUS | Status: DC
  Administered 2015-10-28: 08:00:00 via INTRAVENOUS

## 2015-10-28 MED ORDER — MIDAZOLAM HCL 2 MG/2ML IJ SOLN
INTRAMUSCULAR | Status: AC
  Filled 2015-10-28: qty 2

## 2015-10-28 MED ORDER — SODIUM CHLORIDE 0.9 % IV SOLN: 250.0000 mL | INTRAVENOUS | Status: DC | PRN

## 2015-10-28 MED ORDER — HEPARIN (PORCINE) IN NACL 2-0.9 UNIT/ML-% IJ SOLN
INTRAMUSCULAR | Status: DC | PRN
  Administered 2015-10-28: 500 mL

## 2015-10-28 MED ORDER — SODIUM CHLORIDE 0.9% FLUSH: 3.0000 mL | Freq: Two times a day (BID) | INTRAVENOUS | Status: DC

## 2015-10-28 MED ORDER — MIDAZOLAM HCL 2 MG/2ML IJ SOLN
INTRAMUSCULAR | Status: DC | PRN
  Administered 2015-10-28: 1 mg via INTRAVENOUS

## 2015-10-28 MED ORDER — HEPARIN (PORCINE) IN NACL 2-0.9 UNIT/ML-% IJ SOLN
INTRAMUSCULAR | Status: AC
  Filled 2015-10-28: qty 1000

## 2015-10-28 MED ORDER — FENTANYL CITRATE (PF) 100 MCG/2ML IJ SOLN
INTRAMUSCULAR | Status: AC
  Filled 2015-10-28: qty 2

## 2015-10-28 MED ORDER — LIDOCAINE HCL (PF) 1 % IJ SOLN
INTRAMUSCULAR | Status: AC
  Filled 2015-10-28: qty 30

## 2015-10-28 MED ORDER — FENTANYL CITRATE (PF) 100 MCG/2ML IJ SOLN
INTRAMUSCULAR | Status: DC | PRN
  Administered 2015-10-28: 50 ug via INTRAVENOUS

## 2015-10-28 MED ORDER — LIDOCAINE HCL (PF) 1 % IJ SOLN
INTRAMUSCULAR | Status: DC | PRN
  Administered 2015-10-28: 2 mL

## 2015-10-28 SURGICAL SUPPLY — 8 items
CATH BALLN WEDGE 5F 110CM (CATHETERS) ×1 IMPLANT
PACK CARDIAC CATHETERIZATION (CUSTOM PROCEDURE TRAY) ×1 IMPLANT
PROTECTION STATION PRESSURIZED (MISCELLANEOUS) ×2
SHEATH FAST CATH BRACH 5F 5CM (SHEATH) ×1 IMPLANT
STATION PROTECTION PRESSURIZED (MISCELLANEOUS) IMPLANT
TRANSDUCER W/STOPCOCK (MISCELLANEOUS) ×1 IMPLANT
TUBING ART PRESS 72  MALE/FEM (TUBING) ×1
TUBING ART PRESS 72 MALE/FEM (TUBING) IMPLANT

## 2015-10-28 NOTE — Interval H&P Note (Signed)
History and Physical Interval Note:  10/28/2015 8:55 AM  Alexis [name]  has presented today for surgery, with the diagnosis of shortness of breath  The various methods of treatment have been discussed with the patient and family. After consideration of risks, benefits and other options for treatment, the patient has consented to  Procedure(s): Right Heart Cath (N/A) as a surgical intervention .  The patient's history has been reviewed, patient examined, no change in status, stable for surgery.  I have reviewed the patient's chart and labs.  Questions were answered to the patient's satisfaction.     Adrian Prows

## 2015-10-28 NOTE — Discharge Instructions (Signed)
Angiogram, Care After °Refer to this sheet in the next few weeks. These instructions provide you with information about caring for yourself after your procedure. Your health care provider may also give you more specific instructions. Your treatment has been planned according to current medical practices, but problems sometimes occur. Call your health care provider if you have any problems or questions after your procedure. °WHAT TO EXPECT AFTER THE PROCEDURE °After your procedure, it is typical to have the following: °· Bruising at the catheter insertion site that usually fades within 1-2 weeks. °· Blood collecting in the tissue (hematoma) that may be painful to the touch. It should usually decrease in size and tenderness within 1-2 weeks. °HOME CARE INSTRUCTIONS °· Take medicines only as directed by your health care provider. °· You may shower 24-48 hours after the procedure or as directed by your health care provider. Remove the bandage (dressing) and gently wash the site with plain soap and water. Pat the area dry with a clean towel. Do not rub the site, because this may cause bleeding. °· Do not take baths, swim, or use a hot tub until your health care provider approves. °· Check your insertion site every day for redness, swelling, or drainage. °· Do not apply powder or lotion to the site. °· Do not lift over 10 lb (4.5 kg) for 5 days after your procedure or as directed by your health care provider. °· Ask your health care provider when it is okay to: °¨ Return to work or school. °¨ Resume usual physical activities or sports. °¨ Resume sexual activity. °· Do not drive home if you are discharged the same day as the procedure. Have someone else drive you. °· You may drive 24 hours after the procedure unless otherwise instructed by your health care provider. °· Do not operate machinery or power tools for 24 hours after the procedure or as directed by your health care provider. °· If your procedure was done as an  outpatient procedure, which means that you went home the same day as your procedure, a responsible adult should be with you for the first 24 hours after you arrive home. °· Keep all follow-up visits as directed by your health care provider. This is important. °SEEK MEDICAL CARE IF: °· You have a fever. °· You have chills. °· You have increased bleeding from the catheter insertion site. Hold pressure on the site. °SEEK IMMEDIATE MEDICAL CARE IF: °· You have unusual pain at the catheter insertion site. °· You have redness, warmth, or swelling at the catheter insertion site. °· You have drainage (other than a small amount of blood on the dressing) from the catheter insertion site. °· The catheter insertion site is bleeding, and the bleeding does not stop after 30 minutes of holding steady pressure on the site. °· The area near or just beyond the catheter insertion site becomes pale, cool, tingly, or numb. °  °This information is not intended to replace advice given to you by your health care provider. Make sure you discuss any questions you have with your health care provider. °  °Document Released: 11/20/2004 Document Revised: 05/25/2014 Document Reviewed: 10/05/2012 °Elsevier Interactive Patient Education ©2016 [employer] ° °

## 2015-10-29 MED FILL — Heparin Sodium (Porcine) 2 Unit/ML in Sodium Chloride 0.9%: INTRAMUSCULAR | Qty: 500 | Status: AC

## 2015-12-02 ENCOUNTER — Emergency Department (HOSPITAL_COMMUNITY)
Admission: EM | Admit: 2015-12-02 | Discharge: 2015-12-02 | Disposition: A | Discharge status: Home / Self Care | Payer: Medicaid Other | Attending: Emergency Medicine | Admitting: Emergency Medicine

## 2015-12-02 ENCOUNTER — Emergency Department (HOSPITAL_COMMUNITY): Payer: Medicaid Other

## 2015-12-02 ENCOUNTER — Encounter (HOSPITAL_COMMUNITY): Payer: Self-pay | Admitting: Emergency Medicine

## 2015-12-02 DIAGNOSIS — R0602 Shortness of breath: Secondary | ICD-10-CM | POA: Diagnosis present

## 2015-12-02 DIAGNOSIS — D649 Anemia, unspecified: Secondary | ICD-10-CM | POA: Insufficient documentation

## 2015-12-02 DIAGNOSIS — G8929 Other chronic pain: Secondary | ICD-10-CM | POA: Diagnosis not present

## 2015-12-02 DIAGNOSIS — Z85850 Personal history of malignant neoplasm of thyroid: Secondary | ICD-10-CM | POA: Insufficient documentation

## 2015-12-02 DIAGNOSIS — J45909 Unspecified asthma, uncomplicated: Secondary | ICD-10-CM | POA: Diagnosis not present

## 2015-12-02 DIAGNOSIS — Z91040 Latex allergy status: Secondary | ICD-10-CM | POA: Diagnosis not present

## 2015-12-02 DIAGNOSIS — I890 Lymphedema, not elsewhere classified: Secondary | ICD-10-CM | POA: Insufficient documentation

## 2015-12-02 DIAGNOSIS — E1140 Type 2 diabetes mellitus with diabetic neuropathy, unspecified: Secondary | ICD-10-CM | POA: Diagnosis not present

## 2015-12-02 DIAGNOSIS — R0600 Dyspnea, unspecified: Secondary | ICD-10-CM | POA: Diagnosis not present

## 2015-12-02 DIAGNOSIS — I10 Essential (primary) hypertension: Secondary | ICD-10-CM | POA: Insufficient documentation

## 2015-12-02 LAB — BASIC METABOLIC PANEL
Anion gap: 6 (ref 5–15)
BUN: 6 mg/dL (ref 6–20)
CO2: 28 mmol/L (ref 22–32)
Calcium: 9 mg/dL (ref 8.9–10.3)
Chloride: 105 mmol/L (ref 101–111)
Creatinine, Ser: 0.76 mg/dL (ref 0.44–1.00)
GFR calc Af Amer: >60 mL/min (ref >60)
GFR calc non Af Amer: >60 mL/min (ref >60)
Glucose, Bld: 107 mg/dL — ABNORMAL HIGH (ref 65–99)
Potassium: 3.8 mmol/L (ref 3.5–5.1)
Sodium: 139 mmol/L (ref 135–145)

## 2015-12-02 LAB — BRAIN NATRIURETIC PEPTIDE: B Natriuretic Peptide: 41.8 pg/mL (ref 0.0–100.0)

## 2015-12-02 LAB — I-STAT TROPONIN, ED: Troponin i, poc: 0 ng/mL (ref 0.00–0.08)

## 2015-12-02 LAB — CBC
HCT: 32.2 % — ABNORMAL LOW (ref 36.0–46.0)
Hemoglobin: 10.6 g/dL — ABNORMAL LOW (ref 12.0–15.0)
MCH: 29 pg (ref 26.0–34.0)
MCHC: 32.9 g/dL (ref 30.0–36.0)
MCV: 88.2 fL (ref 78.0–100.0)
Platelets: 417 10*3/uL — ABNORMAL HIGH (ref 150–400)
RBC: 3.65 MIL/uL — ABNORMAL LOW (ref 3.87–5.11)
RDW: 13.6 % (ref 11.5–15.5)
WBC: 11 10*3/uL — ABNORMAL HIGH (ref 4.0–10.5)

## 2015-12-02 LAB — D-DIMER, QUANTITATIVE (NOT AT ARMC): D-Dimer, Quant: 0.31 ug/mL-FEU (ref 0.00–0.50)

## 2015-12-02 MED ORDER — SODIUM CHLORIDE 0.9 % IV BOLUS (SEPSIS)
1000.0000 mL | Freq: Once | INTRAVENOUS | Status: AC
  Administered 2015-12-02: 1000 mL via INTRAVENOUS

## 2015-12-02 NOTE — Discharge Instructions (Signed)
Don't take any Lasix or Benicar today. Make sure that you drink at least six 8 ounce glasses of water. An appointment has been scheduled for you at [facility] office tomorrow at 8:45 AM. Your medications will probably need to be adjusted further. Your hemoglobin today is slightly low at 10.6. Please contact her gastroenterologist. He will need further evaluation to determine if you have intestinal bleeding. Take these instructions with you to Dr.[email or web address]'s office

## 2015-12-02 NOTE — ED Provider Notes (Addendum)
CSN: [id number]     Arrival date & time 12/02/15  0540 History   First MD Initiated Contact with Patient 12/02/15 0719     Chief Complaint  Patient presents with  . Shortness of Breath  . Leg Swelling     (Consider location/radiation/quality/duration/timing/severity/associated sxs/prior Treatment) HPI Complains of shortness of breath and dyspnea on exertion and leg swelling for the past 5 years. She presents today as her feet have become painful over the past 2 days. She's also had increased thirst and some lightheadedness. She was recently hospitalized at [facility] and told to cut back on her Lasix. She denies orthopnea. No other associated symptoms. No history of shortness of breath. Patient had TEE May 2017 showing tiny atrial septal defect by Doppler only otherwise normal. She also had a heart catheterization June 2017 which was normal. Presently feels thirsty and dehydrated no other associated symptoms. Nothing makes symptoms better or worse. Dyspnea is worse with exertion. She has chronic leg edema which she describes as "lymphedema Past Medical History  Diagnosis Date  . Hypertension   . GERD (gastroesophageal reflux disease)   . Arthritis   . Asthma   . Dyspnea   . Degenerative disk disease   . Interstitial cystitis   . Endometriosis 01/18/01  . Fibromyalgia   . DJD (degenerative joint disease) of knee   . Right knee meniscal tear   . Sickle cell trait ([place])   . History of thyroid cancer     2010--  TOTAL THYROIDECTOMY--  NO RECURRENCE  . History of thyroiditis   . Cervical spondylosis     C5-6  . OSA on CPAP   . Varicose veins   . History of colon polyps   . Hypothyroidism, postsurgical   . Lymphedema   . Glaucoma of both eyes   . Vocal cord paralysis, unilateral complete     RIGHT--  POST TOTAL THYROIDECTOMY  . Chronic colitis   . H/O hiatal hernia   . Complication of anesthesia     has awakened in past/  RIGHT VOCAL CORD PARALYSIS  POST TOTAL THYROIDECTOMY  03-08-2009  ([facility])  . Uterine fibroid   . Bilateral dry eyes   . IBS (irritable bowel syndrome)   . Chronic pain   . Sickle cell trait ([place])   . Tremor 12/14/2014  . Diabetic neuropathy, type II diabetes mellitus ([place]) 12/14/2014  . Sjogren's syndrome [facility])    Past Surgical History  Procedure Laterality Date  . Achilles tendon repair Left 2007  . Vein surgery    . Temporal artery biopsy / ligation  2010  . Hydradenitis excision  06/29/2011    Procedure: EXCISION HYDRADENITIS AXILLA;  Surgeon: Hermelinda Dellen, MD;  Location: [place];  Service: Plastics;;  right breast hydradenitis excioion  . Dx laparoscopy/  pelvic bx's/  lysis adhesions  06-26-2000  . Removal perianal abscess  02-14-2001  . Temporal artery biopsy / ligation Right 06-16-2002  . Cysto/  urethral dilation /  bilateral unroofing ureterocele/  bilateral ureteral stent placement  12-01-2002  . Cysto/  bilateral retrograde pyelogram/  hydrodistention/  instillation therapy  05-08-2003  . Wide excision mass,  chest area  10-29-2003  . D & c hysteroscopy/  removal iud  12-06-2003  . Dilatation and curettage with suction  05-29-2005    RETAINED PLACENTA  . Cysto/  hydrodistention/  instillation therapy  06-04-2005  &  03-04-2007  . Total thyroidectomy  03-08-2009  . Pilonidal cyst excision  1983  . Tonsillectomy  1990  . Breast enhancement surgery Bilateral 1997  . Laparoscopy abdomen diagnostic  SEPT  2003   [place] IUD  . Left wrist tendon release  MAY  2007  . Bunionectomy with hammertoe reconstruction and gastroc slide Left FEB  2011    REMOVAL HARDWARE  NOV  2011  . Hydradenitis excision Left 2000  . Colonoscopy with esophagogastroduodenoscopy (egd)  MULTIPLE --  LAST ONE   NOV  2014  . Knee arthroscopy with lateral menisectomy Right 09/08/2013    Procedure: RIGHT KNEE ARTHROSCOPY PARTIAL LATERAL MENISCECTOMY AND DEBRIDEMENT ;  Surgeon: Johnn Hai, MD;  Location: [place];  Service: Orthopedics;  Laterality: Right;  . Tee without cardioversion N/A 09/26/2015    Procedure: TRANSESOPHAGEAL ECHOCARDIOGRAM (TEE);  Surgeon: Adrian Prows, MD;  Location: [place];  Service: Cardiovascular;  Laterality: N/A;  . Cardiac catheterization N/A 10/28/2015    Procedure: Right Heart Cath;  Surgeon: Adrian Prows, MD;  Location: [place] [facility];  Service: Cardiovascular;  Laterality: N/A;   Family History  Problem Relation Age of Onset  . Cancer Paternal Aunt     breast, colon  . Atrial fibrillation Mother   . Diabetes Mother   . Thyroid disease Mother   . Cushing syndrome Mother   . Tremor Mother   . Atrial fibrillation Father   . Glaucoma Father   . Tremor Sister    Social History  Substance Use Topics  . Smoking status: Never Smoker   . Smokeless tobacco: Never Used  . Alcohol Use: 0.0 oz/week    0 Standard drinks or equivalent per week     Comment: very rarely   OB History    Gravida Para Term Preterm AB TAB SAB Ectopic Multiple Living   1 1        1      Review of Systems  Respiratory: Positive for shortness of breath.   Cardiovascular: Positive for leg swelling.  Gastrointestinal: Positive for blood in stool.       Blood in stool last time 2 weeks ago  Musculoskeletal:       Bilateral foot pain  All other systems reviewed and are negative.     Allergies  Adhesive; Bactrim; Clarithromycin; Ibuprofen; Nexium; Nsaids; Other; Sulfa antibiotics; Tolmetin; and Latex  Home Medications   Prior to Admission medications   Medication Sig Start Date End Date Taking? Authorizing Provider  albuterol (PROVENTIL HFA;VENTOLIN HFA) 108 (90 BASE) MCG/ACT inhaler Inhale 2 puffs into the lungs every 4 (four) hours as needed for wheezing or shortness of breath.     Historical Provider, MD  albuterol (PROVENTIL) (2.5 MG/3ML) 0.083% nebulizer solution Take 2.5 mg by nebulization every 6 (six) hours as needed for wheezing or shortness of breath.     Historical Provider, MD  atorvastatin (LIPITOR) 20 MG tablet Take 20 mg by mouth daily. 10/12/15   Historical Provider, MD  brinzolamide (AZOPT) 1 % ophthalmic suspension Place 1 drop into both eyes 3 (three) times daily. 06/03/15   Historical Provider, MD  budesonide-formoterol (SYMBICORT) 160-4.5 MCG/ACT inhaler Inhale 1 puff into the lungs 2 (two) times daily.    Historical Provider, MD  cetirizine (ZYRTEC) 10 MG tablet Take 10 mg by mouth daily.    Historical Provider, MD  cyclobenzaprine (FLEXERIL) 10 MG tablet Take 10 mg by mouth 3 (three) times daily as needed for muscle spasms.     Historical Provider, MD  cycloSPORINE (RESTASIS) 0.05 % ophthalmic emulsion Place 1 drop into both eyes 2 (two) times daily.    Historical Provider, MD  dicyclomine (BENTYL) 20 MG tablet Take 20 mg by mouth 4 (four) times daily.     Historical Provider, MD  diphenhydrAMINE-zinc acetate (BENADRYL) cream Apply 1 application topically as needed for itching.    Historical Provider, MD  furosemide (LASIX) 40 MG tablet Take 40 mg by mouth daily.    Historical Provider, MD  hydrOXYzine (VISTARIL) 50 MG capsule Take 50 mg by mouth 4 (four) times daily as needed for anxiety or itching.  08/04/15   Historical Provider, MD  ketotifen (REFRESH EYE ITCH RELIEF) 0.025 % ophthalmic solution Place 1 drop into both eyes daily as needed (dry eyes).     Historical Provider, MD  lidocaine (XYLOCAINE) 2 % jelly Apply 1 application topically 2 (two) times daily as needed (Apply to affected area.).  08/26/15   Historical Provider, MD  lidocaine (XYLOCAINE) 2 % solution Use as directed 5 mLs in the mouth or throat every 6 (six) hours as needed for mouth pain.  11/20/14   Historical Provider, MD  nystatin (MYCOSTATIN) 100000 UNIT/ML suspension Use as directed 5 mLs in the mouth or throat 4 (four) times daily as needed (For mouth sores.).     Historical Provider, MD  omeprazole-sodium bicarbonate (ZEGERID) 40-1100 MG per capsule Take 1 capsule by  mouth daily before breakfast.    Historical Provider, MD  ondansetron (ZOFRAN) 4 MG tablet Take 8 mg by mouth every 8 (eight) hours as needed for nausea or vomiting.  07/25/13   Antonietta Breach, PA-C  oxycodone (ROXICODONE) 30 MG immediate release tablet Take 30 mg by mouth every 4 (four) hours as needed for pain.    Historical Provider, MD  potassium chloride (K-DUR,KLOR-CON) 10 MEQ tablet Take 10 mEq by mouth daily.     Historical Provider, MD  pregabalin (LYRICA) 50 MG capsule Take 1 capsule (50 mg total) by mouth 2 (two) times daily. Patient taking differently: Take 50 mg by mouth 2 (two) times daily as needed (For pain.).  07/03/15   Kathrynn Ducking, MD  promethazine (PHENERGAN) 25 MG tablet Take 25 mg by mouth every 6 (six) hours as needed for nausea or vomiting.    Historical Provider, MD  silver sulfADIAZINE (SILVADENE) 1 % cream Apply 1 application topically as needed (Apply to affected area.).    Historical Provider, MD  solifenacin (VESICARE) 10 MG tablet Take 10 mg by mouth daily. 07/03/15   Historical Provider, MD  thyroid (ARMOUR THYROID) 60 MG tablet Take 60-90 mg by mouth daily. Take one and a half tablets on Monday, Wednesday, Friday and one tablet the rest of the week. 09/07/15   Historical Provider, MD   BP 129/83 mmHg  Pulse 91  Temp(Src) 98.7 F (37.1 C) (Oral)  Resp 16  SpO2 97% Physical Exam  Constitutional: She is oriented to person, place, and time. She appears well-developed and well-nourished. No distress.  HENT:  Head: Normocephalic and atraumatic.  Eyes: Conjunctivae are normal. Pupils are equal, round, and reactive to light.  Neck: Neck supple. No JVD present. No tracheal deviation present. No thyromegaly present.  Cardiovascular: Normal rate and regular rhythm.   No murmur heard. Pulmonary/Chest: Effort normal and breath sounds normal.  Abdominal: Soft. Bowel sounds are normal. She exhibits no distension. There is no tenderness.  Obese  Musculoskeletal: Normal  range of motion. She exhibits edema. She exhibits no tenderness.  Bilateral lower  extremities with 2+ nonpitting edema. DP pulses 2+ bilaterally  Neurological: She is alert and oriented to person, place, and time. Coordination normal.  Skin: Skin is warm and dry. No rash noted.  Psychiatric: She has a normal mood and affect.  Nursing note and vitals reviewed.   ED Course  Procedures (including critical care time) Labs Review Labs Reviewed  BASIC METABOLIC PANEL - Abnormal; Notable for the following:    Glucose, Bld 107 (*)    All other components within normal limits  CBC - Abnormal; Notable for the following:    WBC 11.0 (*)    RBC 3.65 (*)    Hemoglobin 10.6 (*)    HCT 32.2 (*)    Platelets 417 (*)    All other components within normal limits  BRAIN NATRIURETIC PEPTIDE  I-STAT TROPOININ, ED    Imaging Review Dg Chest 2 View  12/02/2015  CLINICAL DATA:  Swelling lower extremities. EXAM: CHEST  2 VIEW COMPARISON:  09/04/2013. FINDINGS: Mediastinum hilar structures normal. Lungs are clear. Heart size normal. No pleural effusion or pneumothorax. No acute bony abnormality . IMPRESSION: No acute cardiopulmonary disease. Electronically Signed   By: Marcello Moores  Register   On: 12/02/2015 06:52   I have personally reviewed and evaluated these images and lab results as part of my medical decision-making.   EKG Interpretation   Date/Time:  Monday December 02 2015 05:48:32 EDT Ventricular Rate:  90 PR Interval:  156 QRS Duration: 80 QT Interval:  402 QTC Calculation: 491 R Axis:   18 Text Interpretation:  Normal sinus rhythm Minimal voltage criteria for  LVH, may be normal variant Nonspecific T wave abnormality Prolonged QT  Abnormal ECG No significant change since last tracing Confirmed by  Winfred Leeds  MD, Krissie Merrick [phone]) on 12/02/2015 7:20:13 AM      Results for orders placed or performed during the hospital encounter of [id number]  Basic metabolic panel  Result Value Ref Range   Sodium  139 135 - 145 mmol/L   Potassium 3.8 3.5 - 5.1 mmol/L   Chloride 105 101 - 111 mmol/L   CO2 28 22 - 32 mmol/L   Glucose, Bld 107 (H) 65 - 99 mg/dL   BUN 6 6 - 20 mg/dL   Creatinine, Ser 0.76 0.44 - 1.00 mg/dL   Calcium 9.0 8.9 - 10.3 mg/dL   GFR calc non Af Amer >60 >60 mL/min   GFR calc Af Amer >60 >60 mL/min   Anion gap 6 5 - 15  CBC  Result Value Ref Range   WBC 11.0 (H) 4.0 - 10.5 K/uL   RBC 3.65 (L) 3.87 - 5.11 MIL/uL   Hemoglobin 10.6 (L) 12.0 - 15.0 g/dL   HCT 32.2 (L) 36.0 - 46.0 %   MCV 88.2 78.0 - 100.0 fL   MCH 29.0 26.0 - 34.0 pg   MCHC 32.9 30.0 - 36.0 g/dL   RDW 13.6 11.5 - 15.5 %   Platelets 417 (H) 150 - 400 K/uL  Brain natriuretic peptide  Result Value Ref Range   B Natriuretic Peptide 41.8 0.0 - 100.0 pg/mL  D-dimer, quantitative (not at [facility])  Result Value Ref Range   D-Dimer, Quant 0.31 0.00 - 0.50 ug/mL-FEU  I-stat troponin, ED  Result Value Ref Range   Troponin i, poc 0.00 0.00 - 0.08 ng/mL   Comment 3           Dg Chest 2 View  12/02/2015  CLINICAL DATA:  Swelling lower extremities. EXAM: CHEST  2 VIEW COMPARISON:  09/04/2013. FINDINGS: Mediastinum hilar structures normal. Lungs are clear. Heart size normal. No pleural effusion or pneumothorax. No acute bony abnormality . IMPRESSION: No acute cardiopulmonary disease. Electronically Signed   By: Marcello Moores  Register   On: 12/02/2015 06:52    11:15 AM patient feels improved after treatment with intravenous fluids and she ate a meal the emergency department she does complain of low back pain which is chronic. She takes oxycodone regularly for that. Results for orders placed or performed during the hospital encounter of [id number]  Basic metabolic panel  Result Value Ref Range   Sodium 139 135 - 145 mmol/L   Potassium 3.8 3.5 - 5.1 mmol/L   Chloride 105 101 - 111 mmol/L   CO2 28 22 - 32 mmol/L   Glucose, Bld 107 (H) 65 - 99 mg/dL   BUN 6 6 - 20 mg/dL   Creatinine, Ser 0.76 0.44 - 1.00 mg/dL   Calcium 9.0  8.9 - 10.3 mg/dL   GFR calc non Af Amer >60 >60 mL/min   GFR calc Af Amer >60 >60 mL/min   Anion gap 6 5 - 15  CBC  Result Value Ref Range   WBC 11.0 (H) 4.0 - 10.5 K/uL   RBC 3.65 (L) 3.87 - 5.11 MIL/uL   Hemoglobin 10.6 (L) 12.0 - 15.0 g/dL   HCT 32.2 (L) 36.0 - 46.0 %   MCV 88.2 78.0 - 100.0 fL   MCH 29.0 26.0 - 34.0 pg   MCHC 32.9 30.0 - 36.0 g/dL   RDW 13.6 11.5 - 15.5 %   Platelets 417 (H) 150 - 400 K/uL  Brain natriuretic peptide  Result Value Ref Range   B Natriuretic Peptide 41.8 0.0 - 100.0 pg/mL  D-dimer, quantitative (not at [facility])  Result Value Ref Range   D-Dimer, Quant 0.31 0.00 - 0.50 ug/mL-FEU  I-stat troponin, ED  Result Value Ref Range   Troponin i, poc 0.00 0.00 - 0.08 ng/mL   Comment 3           Dg Chest 2 View  12/02/2015  CLINICAL DATA:  Swelling lower extremities. EXAM: CHEST  2 VIEW COMPARISON:  09/04/2013. FINDINGS: Mediastinum hilar structures normal. Lungs are clear. Heart size normal. No pleural effusion or pneumothorax. No acute bony abnormality . IMPRESSION: No acute cardiopulmonary disease. Electronically Signed   By: Marcello Moores  Register   On: 12/02/2015 06:52    MDM  Pretest clinical suspicion for pulmonary embolism is low. Negative d-dimer. Strongly doubt acute coronary syndrome and patient clinically not in congestive heart failure with low BNP Patient agrees with taking oxycodone when she gets home as she is driving. I discussed case with Dr.Osei bonsu via telephone. An appointment has been scheduled for patient tomorrow at his office at 8:45 AM. I feel the patient is dehydrated. She was told to withhold Benicar-HCt today and to withhold Lasix today. Leg edema is chronic. She is also told to hydrate orally at home today. Patient advised to contact gastroenterologist to schedule evaluation for blood in stool Diagnosis #1 dyspnea #2 anemia #3 chronic pain #4 lymphedema. Final diagnoses:  None        Orlie Dakin, MD 12/02/15 [place], MD 12/02/15 1126

## 2015-12-02 NOTE — ED Notes (Signed)
Patient arrives with complaint of lower extremity swelling coupled with shortness of breath. States history of similar, but denies CHF history when asked states "its unclear". Patient noted to be taking breaths during conversation, and having difficulty completing sentences.

## 2015-12-02 NOTE — ED Notes (Signed)
Family at bedside. 

## 2016-01-21 ENCOUNTER — Emergency Department (HOSPITAL_COMMUNITY): Payer: Medicaid Other

## 2016-01-21 ENCOUNTER — Encounter (HOSPITAL_COMMUNITY): Payer: Self-pay | Admitting: Emergency Medicine

## 2016-01-21 ENCOUNTER — Emergency Department (HOSPITAL_COMMUNITY)
Admission: EM | Admit: 2016-01-21 | Discharge: 2016-01-21 | Disposition: A | Discharge status: Home / Self Care | Payer: Medicaid Other | Attending: Emergency Medicine | Admitting: Emergency Medicine

## 2016-01-21 DIAGNOSIS — R002 Palpitations: Secondary | ICD-10-CM | POA: Diagnosis not present

## 2016-01-21 DIAGNOSIS — R109 Unspecified abdominal pain: Secondary | ICD-10-CM

## 2016-01-21 DIAGNOSIS — E1140 Type 2 diabetes mellitus with diabetic neuropathy, unspecified: Secondary | ICD-10-CM | POA: Diagnosis not present

## 2016-01-21 DIAGNOSIS — K625 Hemorrhage of anus and rectum: Secondary | ICD-10-CM | POA: Diagnosis not present

## 2016-01-21 DIAGNOSIS — Z91040 Latex allergy status: Secondary | ICD-10-CM | POA: Insufficient documentation

## 2016-01-21 DIAGNOSIS — J45909 Unspecified asthma, uncomplicated: Secondary | ICD-10-CM | POA: Insufficient documentation

## 2016-01-21 DIAGNOSIS — R1084 Generalized abdominal pain: Secondary | ICD-10-CM | POA: Diagnosis present

## 2016-01-21 DIAGNOSIS — E890 Postprocedural hypothyroidism: Secondary | ICD-10-CM | POA: Diagnosis not present

## 2016-01-21 LAB — COMPREHENSIVE METABOLIC PANEL
ALT: 11 U/L — ABNORMAL LOW (ref 14–54)
AST: 14 U/L — ABNORMAL LOW (ref 15–41)
Albumin: 4.7 g/dL (ref 3.5–5.0)
Alkaline Phosphatase: 93 U/L (ref 38–126)
Anion gap: 10 (ref 5–15)
BUN: 7 mg/dL (ref 6–20)
CO2: 25 mmol/L (ref 22–32)
Calcium: 10.2 mg/dL (ref 8.9–10.3)
Chloride: 108 mmol/L (ref 101–111)
Creatinine, Ser: 0.84 mg/dL (ref 0.44–1.00)
GFR calc Af Amer: >60 mL/min (ref >60)
GFR calc non Af Amer: >60 mL/min (ref >60)
Glucose, Bld: 83 mg/dL (ref 65–99)
Potassium: 3.3 mmol/L — ABNORMAL LOW (ref 3.5–5.1)
Sodium: 143 mmol/L (ref 135–145)
Total Bilirubin: 0.4 mg/dL (ref 0.3–1.2)
Total Protein: 8.8 g/dL — ABNORMAL HIGH (ref 6.5–8.1)

## 2016-01-21 LAB — URINE MICROSCOPIC-ADD ON

## 2016-01-21 LAB — URINALYSIS, ROUTINE W REFLEX MICROSCOPIC
Bilirubin Urine: NEGATIVE
Glucose, UA: NEGATIVE mg/dL
Ketones, ur: NEGATIVE mg/dL
Nitrite: NEGATIVE
Protein, ur: NEGATIVE mg/dL
Specific Gravity, Urine: 1.019 (ref 1.005–1.030)
pH: 6 (ref 5.0–8.0)

## 2016-01-21 LAB — CBC
HCT: 41.7 % (ref 36.0–46.0)
Hemoglobin: 13.9 g/dL (ref 12.0–15.0)
MCH: 28.7 pg (ref 26.0–34.0)
MCHC: 33.3 g/dL (ref 30.0–36.0)
MCV: 86 fL (ref 78.0–100.0)
Platelets: 458 10*3/uL — ABNORMAL HIGH (ref 150–400)
RBC: 4.85 MIL/uL (ref 3.87–5.11)
RDW: 13.3 % (ref 11.5–15.5)
WBC: 14.3 10*3/uL — ABNORMAL HIGH (ref 4.0–10.5)

## 2016-01-21 LAB — LIPASE, BLOOD: Lipase: 35 U/L (ref 11–51)

## 2016-01-21 LAB — TYPE AND SCREEN
ABO/RH(D): A POS
Antibody Screen: NEGATIVE

## 2016-01-21 LAB — ABO/RH: ABO/RH(D): A POS

## 2016-01-21 MED ORDER — OXYCODONE HCL 5 MG PO TABS
30.0000 mg | ORAL_TABLET | Freq: Once | ORAL | Status: AC
  Administered 2016-01-21: 30 mg via ORAL
  Filled 2016-01-21: qty 6

## 2016-01-21 MED ORDER — SODIUM CHLORIDE 0.9 % IV SOLN
1000.0000 mL | Freq: Once | INTRAVENOUS | Status: AC
  Administered 2016-01-21: 1000 mL via INTRAVENOUS

## 2016-01-21 MED ORDER — ONDANSETRON 8 MG PO TBDP: 8.0000 mg | ORAL_TABLET | Freq: Three times a day (TID) | ORAL | 0 refills | Status: DC | PRN

## 2016-01-21 MED ORDER — IOPAMIDOL (ISOVUE-300) INJECTION 61%
INTRAVENOUS | Status: AC
  Administered 2016-01-21: 100 mL
  Filled 2016-01-21: qty 100

## 2016-01-21 MED ORDER — POTASSIUM CHLORIDE 20 MEQ/15ML (10%) PO SOLN
40.0000 meq | Freq: Once | ORAL | Status: AC
  Administered 2016-01-21: 40 meq via ORAL
  Filled 2016-01-21: qty 30

## 2016-01-21 MED ORDER — ONDANSETRON HCL 4 MG/2ML IJ SOLN
4.0000 mg | Freq: Once | INTRAMUSCULAR | Status: AC
  Administered 2016-01-21: 4 mg via INTRAVENOUS
  Filled 2016-01-21: qty 2

## 2016-01-21 MED ORDER — MORPHINE SULFATE (PF) 4 MG/ML IV SOLN
6.0000 mg | Freq: Once | INTRAVENOUS | Status: AC
  Administered 2016-01-21: 6 mg via INTRAVENOUS
  Filled 2016-01-21: qty 2

## 2016-01-21 MED ORDER — OXYCODONE-ACETAMINOPHEN 5-325 MG PO TABS
2.0000 | ORAL_TABLET | Freq: Once | ORAL | Status: DC
  Filled 2016-01-21: qty 2

## 2016-01-21 MED ORDER — SODIUM CHLORIDE 0.9 % IV SOLN
1000.0000 mL | INTRAVENOUS | Status: DC
  Administered 2016-01-21: 1000 mL via INTRAVENOUS

## 2016-01-21 NOTE — ED Notes (Signed)
Pt requesting pain medication, refused percocet. Dr.Campos aware and will update pt. Pt made aware

## 2016-01-21 NOTE — Discharge Instructions (Signed)
Please call your gastroenterology team at [facility] for additional recommendations.

## 2016-01-21 NOTE — ED Provider Notes (Signed)
[facility] DEPT Provider Note   CSN: [id number] Arrival date & time: 01/21/16  1208     History   Chief Complaint Chief Complaint  Patient presents with  . Abdominal Pain  . Shortness of Breath  . GI Bleeding    HPI OLIVA COLUMBO is a 50 y.o. female.  Patient presents to the emergency department with complaints of generalized abdominal cramping as well as blood in her stool over the past 3 days.  She has a long-standing history of colitis for which she follows with the gastroenterology team at [facility].  She reports nausea without vomiting.  She denies hematemesis.  She's not on blood thinners.  She reports some mild right-sided abdominal discomfort and pain as well.  She does have a history of chronic back pain for which she is on 30 mg oxycodone as she continues to take pain medication but it does not improve her symptoms.  Today she felt weak and dizzy but never had a syncopal episode.  She does have a history of colonic polyps and requires colonoscopies every 5 years.  She is scheduled for repeat endoscopy and colonoscopy in the next several months.   The history is provided by the patient and medical records.    Past Medical History:  Diagnosis Date  . Arthritis   . Asthma   . Bilateral dry eyes   . Cervical spondylosis    C5-6  . Chronic colitis   . Chronic pain   . Complication of anesthesia    has awakened in past/  RIGHT VOCAL CORD PARALYSIS  POST TOTAL THYROIDECTOMY  03-08-2009 ([facility])  . Degenerative disk disease   . Diabetic neuropathy, type II diabetes mellitus ([place]) 12/14/2014  . DJD (degenerative joint disease) of knee   . Dyspnea   . Endometriosis 01/18/01  . Fibromyalgia   . GERD (gastroesophageal reflux disease)   . Glaucoma of both eyes   . H/O hiatal hernia   . History of colon polyps   . History of thyroid cancer    2010--  TOTAL THYROIDECTOMY--  NO RECURRENCE  . History of thyroiditis   . Hypertension   . Hypothyroidism,  postsurgical   . IBS (irritable bowel syndrome)   . Interstitial cystitis   . Lymphedema   . OSA on CPAP   . Right knee meniscal tear   . Sickle cell trait ([place])   . Sickle cell trait ([place])   . Sjogren's syndrome ([place])   . Tremor 12/14/2014  . Uterine fibroid   . Varicose veins   . Vocal cord paralysis, unilateral complete    RIGHT--  POST TOTAL THYROIDECTOMY    Patient Active Problem List   Diagnosis Date Noted  . Tremor 12/14/2014  . Diabetic neuropathy, type II diabetes mellitus ([place]) 12/14/2014  . Varicose veins of lower extremities with other complications [id number]  . Leg swelling 07/25/2012  . SOB (shortness of breath) 02/22/2012  . Chronic pelvic pain in female 06/26/2000    Past Surgical History:  Procedure Laterality Date  . ACHILLES TENDON REPAIR Left 2007  . BREAST ENHANCEMENT SURGERY Bilateral 1997  . BUNIONECTOMY WITH HAMMERTOE RECONSTRUCTION AND GASTROC SLIDE Left FEB  2011   REMOVAL HARDWARE  NOV  2011  . CARDIAC CATHETERIZATION N/A 10/28/2015   Procedure: Right Heart Cath;  Surgeon: Adrian Prows, MD;  Location: [place] [facility];  Service: Cardiovascular;  Laterality: N/A;  . COLONOSCOPY WITH ESOPHAGOGASTRODUODENOSCOPY (EGD)  MULTIPLE --  LAST ONE  NOV  2014  . CYSTO/  BILATERAL RETROGRADE PYELOGRAM/  HYDRODISTENTION/  INSTILLATION THERAPY  05-08-2003  . CYSTO/  HYDRODISTENTION/  INSTILLATION THERAPY  06-04-2005  &  03-04-2007  . CYSTO/  URETHRAL DILATION /  BILATERAL UNROOFING URETEROCELE/  BILATERAL URETERAL STENT PLACEMENT  12-01-2002  . D & C HYSTEROSCOPY/  REMOVAL IUD  12-06-2003  . DILATATION AND CURETTAGE WITH SUCTION  05-29-2005   RETAINED PLACENTA  . DX LAPAROSCOPY/  PELVIC BX'S/  LYSIS ADHESIONS  06-26-2000  . HYDRADENITIS EXCISION  06/29/2011   Procedure: EXCISION HYDRADENITIS AXILLA;  Surgeon: Hermelinda Dellen, MD;  Location: [facility];  Service: Plastics;;  right breast hydradenitis excioion  . HYDRADENITIS EXCISION Left 2000    . KNEE ARTHROSCOPY WITH LATERAL MENISECTOMY Right 09/08/2013   Procedure: RIGHT KNEE ARTHROSCOPY PARTIAL LATERAL MENISCECTOMY AND DEBRIDEMENT ;  Surgeon: Johnn Hai, MD;  Location: [facility];  Service: Orthopedics;  Laterality: Right;  . LAPAROSCOPY ABDOMEN DIAGNOSTIC  SEPT  2003   [place]   AND PLACEMENT IUD  . LEFT WRIST TENDON RELEASE  MAY  2007  . PILONIDAL CYST EXCISION  1983  . REMOVAL PERIANAL ABSCESS  02-14-2001  . TEE WITHOUT CARDIOVERSION N/A 09/26/2015   Procedure: TRANSESOPHAGEAL ECHOCARDIOGRAM (TEE);  Surgeon: Adrian Prows, MD;  Location: [facility];  Service: Cardiovascular;  Laterality: N/A;  . TEMPORAL ARTERY BIOPSY / LIGATION  2010  . TEMPORAL ARTERY BIOPSY / LIGATION Right 06-16-2002  . TONSILLECTOMY  1990  . TOTAL THYROIDECTOMY  03-08-2009  . VEIN SURGERY    . WIDE EXCISION MASS,  CHEST AREA  10-29-2003    OB History    Gravida Para Term Preterm AB Living   1 1       1    SAB TAB Ectopic Multiple Live Births           1       Home Medications    Prior to Admission medications   Medication Sig Start Date End Date Taking? Authorizing Provider  albuterol (PROVENTIL HFA;VENTOLIN HFA) 108 (90 BASE) MCG/ACT inhaler Inhale 2 puffs into the lungs every 4 (four) hours as needed for wheezing or shortness of breath.    Yes Historical Provider, MD  albuterol (PROVENTIL) (2.5 MG/3ML) 0.083% nebulizer solution Take 2.5 mg by nebulization every 6 (six) hours as needed for wheezing or shortness of breath.   Yes Historical Provider, MD  brinzolamide (AZOPT) 1 % ophthalmic suspension Place 1 drop into both eyes 3 (three) times daily. 06/03/15  Yes Historical Provider, MD  budesonide-formoterol (SYMBICORT) 160-4.5 MCG/ACT inhaler Inhale 1 puff into the lungs 2 (two) times daily.   Yes Historical Provider, MD  cetirizine (ZYRTEC) 10 MG tablet Take 10 mg by mouth daily as needed.    Yes Historical Provider, MD  cyclobenzaprine (FLEXERIL) 10 MG tablet Take 10 mg  by mouth 3 (three) times daily as needed for muscle spasms.    Yes Historical Provider, MD  cycloSPORINE (RESTASIS) 0.05 % ophthalmic emulsion Place 1 drop into both eyes 2 (two) times daily.   Yes Historical Provider, MD  dicyclomine (BENTYL) 20 MG tablet Take 20 mg by mouth 4 (four) times daily.    Yes Historical Provider, MD  ketotifen (REFRESH EYE ITCH RELIEF) 0.025 % ophthalmic solution Place 1 drop into both eyes daily as needed (dry eyes).    Yes Historical Provider, MD  lidocaine (XYLOCAINE) 2 % jelly Apply 1 application topically 2 (two) times daily as needed (Apply to  affected area.).  08/26/15  Yes Historical Provider, MD  lidocaine (XYLOCAINE) 2 % solution Use as directed 5 mLs in the mouth or throat every 6 (six) hours as needed for mouth pain.  11/20/14  Yes Historical Provider, MD  nystatin (MYCOSTATIN) 100000 UNIT/ML suspension Use as directed 5 mLs in the mouth or throat 4 (four) times daily as needed (For mouth sores.).    Yes Historical Provider, MD  ondansetron (ZOFRAN) 4 MG tablet Take 8 mg by mouth every 8 (eight) hours as needed for nausea or vomiting.  07/25/13  Yes Antonietta Breach, PA-C  oxycodone (ROXICODONE) 30 MG immediate release tablet Take 30 mg by mouth every 4 (four) hours as needed for pain.   Yes Historical Provider, MD  potassium chloride (K-DUR,KLOR-CON) 10 MEQ tablet Take 10 mEq by mouth daily.    Yes Historical Provider, MD  pregabalin (LYRICA) 50 MG capsule Take 1 capsule (50 mg total) by mouth 2 (two) times daily. Patient taking differently: Take 50 mg by mouth 2 (two) times daily as needed (For pain.).  07/03/15  Yes Kathrynn Ducking, MD  promethazine (PHENERGAN) 25 MG tablet Take 25 mg by mouth every 6 (six) hours as needed for nausea or vomiting.   Yes Historical Provider, MD  silver sulfADIAZINE (SILVADENE) 1 % cream Apply 1 application topically as needed (Apply to affected area.).   Yes Historical Provider, MD  solifenacin (VESICARE) 10 MG tablet Take 10 mg by  mouth daily. 07/03/15  Yes Historical Provider, MD  thyroid (ARMOUR THYROID) 60 MG tablet Take 60-90 mg by mouth daily. Take one and a half tablets on Monday, Wednesday, Friday and one tablet the rest of the week. 09/07/15  Yes Historical Provider, MD  diphenhydrAMINE-zinc acetate (BENADRYL) cream Apply 1 application topically as needed for itching.    Historical Provider, MD  hydrOXYzine (VISTARIL) 50 MG capsule Take 50 mg by mouth 4 (four) times daily as needed for anxiety or itching.  08/04/15   Historical Provider, MD    Family History Family History  Problem Relation Age of Onset  . Atrial fibrillation Mother   . Diabetes Mother   . Thyroid disease Mother   . Cushing syndrome Mother   . Tremor Mother   . Atrial fibrillation Father   . Glaucoma Father   . Tremor Sister   . Cancer Paternal Aunt     breast, colon    Social History Social History  Substance Use Topics  . Smoking status: Never Smoker  . Smokeless tobacco: Never Used  . Alcohol use 0.0 oz/week     Comment: very rarely     Allergies   Adhesive [tape]; Bactrim; Clarithromycin; Ibuprofen; Nexium [esomeprazole]; Nsaids; Other; Peg 3350-electrolytes; Sulfa antibiotics; Tolmetin; and Latex   Review of Systems Review of Systems  All other systems reviewed and are negative.    Physical Exam Updated Vital Signs BP 108/76   Pulse 96   Temp 99.1 F (37.3 C) (Oral)   Resp 18   Ht 5\' 2"  (1.575 m)   Wt 195 lb (88.5 kg)   SpO2 100%   BMI 35.67 kg/m   Physical Exam  Constitutional: She is oriented to person, place, and time. She appears well-developed and well-nourished. No distress.  HENT:  Head: Normocephalic and atraumatic.  Eyes: EOM are normal.  Neck: Normal range of motion.  Cardiovascular: Normal rate, regular rhythm and normal heart sounds.   Pulmonary/Chest: Effort normal and breath sounds normal.  Abdominal: Soft. She exhibits no distension.  Right-sided abdominal tenderness without guarding or  rebound  Musculoskeletal: Normal range of motion.  Neurological: She is alert and oriented to person, place, and time.  Skin: Skin is warm and dry.  Psychiatric: She has a normal mood and affect. Judgment normal.  Nursing note and vitals reviewed.    ED Treatments / Results  Labs (all labs ordered are listed, but only abnormal results are displayed) Labs Reviewed  COMPREHENSIVE METABOLIC PANEL - Abnormal; Notable for the following:       Result Value   Potassium 3.3 (*)    Total Protein 8.8 (*)    AST 14 (*)    ALT 11 (*)    All other components within normal limits  CBC - Abnormal; Notable for the following:    WBC 14.3 (*)    Platelets 458 (*)    All other components within normal limits  URINALYSIS, ROUTINE W REFLEX MICROSCOPIC (NOT AT [facility]) - Abnormal; Notable for the following:    Hgb urine dipstick TRACE (*)    Leukocytes, UA TRACE (*)    All other components within normal limits  URINE MICROSCOPIC-ADD ON - Abnormal; Notable for the following:    Squamous Epithelial / LPF 0-5 (*)    Bacteria, UA RARE (*)    All other components within normal limits  LIPASE, BLOOD  TYPE AND SCREEN  ABO/RH    EKG  EKG Interpretation  Date/Time:  Tuesday January 21 2016 12:13:57 EDT Ventricular Rate:  111 PR Interval:  134 QRS Duration: 78 QT Interval:  338 QTC Calculation: 459 R Axis:   5 Text Interpretation:  Sinus tachycardia Minimal voltage criteria for LVH, may be normal variant Cannot rule out Anterior infarct , age undetermined Abnormal ECG No significant change was found Confirmed by Standley Bargo  MD, Lennette Bihari ([id number]) on 01/21/2016 3:51:23 PM       Radiology Ct Abdomen Pelvis W Contrast  Result Date: 01/21/2016 CLINICAL DATA:  Right-sided abdominal pain. EXAM: CT ABDOMEN AND PELVIS WITH CONTRAST TECHNIQUE: Multidetector CT imaging of the abdomen and pelvis was performed using the standard protocol following bolus administration of intravenous contrast. CONTRAST:  173mL  ISOVUE-300 IOPAMIDOL (ISOVUE-300) INJECTION 61% COMPARISON:  08/08/2014 FINDINGS: Lower chest:  No acute findings. Hepatobiliary: No masses or other significant abnormality. Pancreas: No mass, inflammatory changes, or other significant abnormality. Spleen: Within normal limits in size and appearance. Adrenals/Urinary Tract: No masses identified. No evidence of hydronephrosis. Stomach/Bowel: No evidence of obstruction, inflammatory process, or abnormal fluid collections. Normal appendix. No pneumatosis, pneumoperitoneum or portal venous gas. No abdominal or pelvic free fluid. Vascular/Lymphatic: No pathologically enlarged lymph nodes. No evidence of abdominal aortic aneurysm. Reproductive: No mass or other significant abnormality. Other: None. Musculoskeletal: No lytic or sclerotic osseous lesion. Degenerative disc disease with disc height loss at L5-S1. Bilateral facet arthropathy at L4-5 and L5-S1. IMPRESSION: No acute abdominal or pelvic pathology. Electronically Signed   By: Kathreen Devoid   On: 01/21/2016 17:52   Dg Chest Portable 1 View  Result Date: 01/21/2016 CLINICAL DATA:  Short of breath EXAM: PORTABLE CHEST 1 VIEW COMPARISON:  12/02/2015 FINDINGS: Normal heart size. Lungs clear. No pneumothorax. No pleural effusion. IMPRESSION: No active disease. Electronically Signed   By: Marybelle Killings M.D.   On: 01/21/2016 15:42    Procedures Procedures (including critical care time)  Medications Ordered in ED Medications  0.9 %  sodium chloride infusion (0 mLs Intravenous Stopped 01/21/16 1843)    Followed by  0.9 %  sodium chloride infusion (  1,000 mLs Intravenous New Bag/Given 01/21/16 1902)  oxyCODONE-acetaminophen (PERCOCET/ROXICET) 5-325 MG per tablet 2 tablet (2 tablets Oral Not Given 01/21/16 1907)  oxyCODONE (Oxy IR/ROXICODONE) immediate release tablet 30 mg (not administered)  potassium chloride 20 MEQ/15ML (10%) solution 40 mEq (40 mEq Oral Given 01/21/16 1609)  morphine 4 MG/ML injection 6 mg (6 mg  Intravenous Given 01/21/16 1609)  ondansetron (ZOFRAN) injection 4 mg (4 mg Intravenous Given 01/21/16 1609)  iopamidol (ISOVUE-300) 61 % injection (100 mLs  Contrast Given 01/21/16 1724)     Initial Impression / Assessment and Plan / ED Course  I have reviewed the triage vital signs and the nursing notes.  Pertinent labs & imaging results that were available during my care of the patient were reviewed by me and considered in my medical decision making (see chart for details).  Clinical Course    Patient be given her home dose of 30 mg of oxycodone.  Her CT scan is without acute pathology.  She'll need ongoing follow-up with her gastroenterology team.  I do not think she needs additional workup or hospitalization at this time.  Her vital signs are fine.  In passing she does report some palpitations and feeling lightheadedness with what she describes as bradycardia.  She has seen a cardiologist as an outpatient but was never placed on a Holter monitor.  She is asking for referral to a cardiologist.  I provided her the information for [facility]  Final Clinical Impressions(s) / ED Diagnoses   Final diagnoses:  None    New Prescriptions New Prescriptions   No medications on file     Jola Schmidt, MD 01/21/16 2020

## 2016-01-21 NOTE — ED Notes (Signed)
Provider at bedside

## 2016-01-21 NOTE — ED Triage Notes (Signed)
Last Thursday started to have diarrhea and then has had blood in her stool since Sunday ( hx of colititis) has had rt abd pain and got clammy and dizzy today, felt weak

## 2016-01-21 NOTE — ED Notes (Signed)
Pt to CT

## 2016-01-30 ENCOUNTER — Other Ambulatory Visit: Payer: Self-pay | Admitting: Neurology

## 2016-02-21 ENCOUNTER — Ambulatory Visit (INDEPENDENT_AMBULATORY_CARE_PROVIDER_SITE_OTHER): Payer: Medicaid Other | Admitting: Neurology

## 2016-02-21 ENCOUNTER — Telehealth: Payer: Self-pay | Admitting: Neurology

## 2016-02-21 ENCOUNTER — Encounter: Payer: Self-pay | Admitting: Neurology

## 2016-02-21 VITALS — BP 139/87 | HR 105 | Ht 62.0 in | Wt 210.0 lb

## 2016-02-21 DIAGNOSIS — M797 Fibromyalgia: Secondary | ICD-10-CM

## 2016-02-21 DIAGNOSIS — R251 Tremor, unspecified: Secondary | ICD-10-CM | POA: Diagnosis not present

## 2016-02-21 MED ORDER — NORTRIPTYLINE HCL 10 MG PO CAPS: ORAL_CAPSULE | ORAL | 3 refills | Status: DC

## 2016-02-21 NOTE — Progress Notes (Signed)
Reason for visit: Tremors  Alexis [name] is an 50 y.o. female  History of present illness:  Alexis [name] is a 50 year old right-handed white female with a history of fibromyalgia. The patient has reported tremors, she has undergone MRI of the brain that was unremarkable, she has undergone EMG and nerve conduction studies given reports of numbness and discomfort, no evidence of a peripheral neuropathy is seen. No evidence of carpal tunnel syndrome seen on either side. The patient has been placed on Lyrica for the fibromyalgia, she could not tolerate the medication well, taking the medication during the day resulted in too much drowsiness. Gabapentin in the past resulted in stomach upset, and she could not tolerate Cymbalta. The patient reports some pain from the neck down to the lower back and into the legs. She has had issues with left-sided numbness including the face, arm, and leg that is felt to be nonorganic.  Past Medical History:  Diagnosis Date  . Arthritis   . Asthma   . Bilateral dry eyes   . Cervical spondylosis    C5-6  . Chronic colitis   . Chronic pain   . Complication of anesthesia    has awakened in past/  RIGHT VOCAL CORD PARALYSIS  POST TOTAL THYROIDECTOMY  03-08-2009 ([facility])  . Degenerative disk disease   . Diabetic neuropathy, type II diabetes mellitus ([place]) 12/14/2014  . DJD (degenerative joint disease) of knee   . Dyspnea   . Endometriosis 01/18/01  . Fibromyalgia   . GERD (gastroesophageal reflux disease)   . GI bleed   . Glaucoma of both eyes   . H/O hiatal hernia   . History of colon polyps   . History of thyroid cancer    2010--  TOTAL THYROIDECTOMY--  NO RECURRENCE  . History of thyroiditis   . Hypertension   . Hypothyroidism, postsurgical   . IBS (irritable bowel syndrome)   . Interstitial cystitis   . Lymphedema   . OSA on CPAP   . Right knee meniscal tear   . Sickle cell trait ([place])   . Sickle cell trait ([place])   . Sjogren's syndrome  ([place])   . Tremor 12/14/2014  . Uterine fibroid   . Varicose veins   . Vocal cord paralysis, unilateral complete    RIGHT--  POST TOTAL THYROIDECTOMY    Past Surgical History:  Procedure Laterality Date  . ACHILLES TENDON REPAIR Left 2007  . BREAST ENHANCEMENT SURGERY Bilateral 1997  . BUNIONECTOMY WITH HAMMERTOE RECONSTRUCTION AND GASTROC SLIDE Left FEB  2011   REMOVAL HARDWARE  NOV  2011  . CARDIAC CATHETERIZATION N/A 10/28/2015   Procedure: Right Heart Cath;  Surgeon: Adrian Prows, MD;  Location: [facility];  Service: Cardiovascular;  Laterality: N/A;  . COLONOSCOPY WITH ESOPHAGOGASTRODUODENOSCOPY (EGD)  MULTIPLE --  LAST ONE   NOV  2014  . CYSTO/  BILATERAL RETROGRADE PYELOGRAM/  HYDRODISTENTION/  INSTILLATION THERAPY  05-08-2003  . CYSTO/  HYDRODISTENTION/  INSTILLATION THERAPY  06-04-2005  &  03-04-2007  . CYSTO/  URETHRAL DILATION /  BILATERAL UNROOFING URETEROCELE/  BILATERAL URETERAL STENT PLACEMENT  12-01-2002  . D & C HYSTEROSCOPY/  REMOVAL IUD  12-06-2003  . DILATATION AND CURETTAGE WITH SUCTION  05-29-2005   RETAINED PLACENTA  . DX LAPAROSCOPY/  PELVIC BX'S/  LYSIS ADHESIONS  06-26-2000  . HYDRADENITIS EXCISION  06/29/2011   Procedure: EXCISION HYDRADENITIS AXILLA;  Surgeon: Hermelinda Dellen, MD;  Location: [facility];  Service: Plastics;;  right breast hydradenitis excioion  . HYDRADENITIS EXCISION Left 2000  . KNEE ARTHROSCOPY WITH LATERAL MENISECTOMY Right 09/08/2013   Procedure: RIGHT KNEE ARTHROSCOPY PARTIAL LATERAL MENISCECTOMY AND DEBRIDEMENT ;  Surgeon: Johnn Hai, MD;  Location: [facility];  Service: Orthopedics;  Laterality: Right;  . LAPAROSCOPY ABDOMEN DIAGNOSTIC  SEPT  2003   [place]   AND PLACEMENT IUD  . LEFT WRIST TENDON RELEASE  MAY  2007  . PILONIDAL CYST EXCISION  1983  . REMOVAL PERIANAL ABSCESS  02-14-2001  . TEE WITHOUT CARDIOVERSION N/A 09/26/2015   Procedure: TRANSESOPHAGEAL ECHOCARDIOGRAM (TEE);   Surgeon: Adrian Prows, MD;  Location: [facility];  Service: Cardiovascular;  Laterality: N/A;  . TEMPORAL ARTERY BIOPSY / LIGATION  2010  . TEMPORAL ARTERY BIOPSY / LIGATION Right 06-16-2002  . TONSILLECTOMY  1990  . TOTAL THYROIDECTOMY  03-08-2009  . VEIN SURGERY    . WIDE EXCISION MASS,  CHEST AREA  10-29-2003    Family History  Problem Relation Age of Onset  . Atrial fibrillation Mother   . Diabetes Mother   . Thyroid disease Mother   . Cushing syndrome Mother   . Tremor Mother   . Atrial fibrillation Father   . Glaucoma Father   . Tremor Sister   . Cancer Paternal Aunt     breast, colon    Social history:  reports that she has never smoked. She has never used smokeless tobacco. She reports that she drinks alcohol. She reports that she does not use drugs.    Allergies  Allergen Reactions  . Adhesive [Tape] Other (See Comments)    SKIN IRRITATION  . Bactrim Nausea And Vomiting  . Clarithromycin Nausea And Vomiting  . Ibuprofen Nausea And Vomiting and Other (See Comments)    SEVERE STOMACH PAIN - CAN TAKE IV WITH NO ISSUES  . Nexium [Esomeprazole] Other (See Comments)    Cramps and headaches  . Nsaids Other (See Comments)    AVOIDS DUE TO GERD  . Other Nausea And Vomiting and Other (See Comments)    Pulp in all fruits - can only eat if cooked  . Peg 3350-Electrolytes Other (See Comments)    Aspiration   . Sulfa Antibiotics Nausea And Vomiting and Other (See Comments)    ORAL MED.   (PER  , IV,  NO ISSUES)  . Tolmetin Other (See Comments)    AVOIDS DUE TO GERD  . Latex Rash and Other (See Comments)    SEVERE ITCHING    Medications:  Prior to Admission medications   Medication Sig Start Date End Date Taking? Authorizing Provider  albuterol (PROVENTIL HFA;VENTOLIN HFA) 108 (90 BASE) MCG/ACT inhaler Inhale 2 puffs into the lungs every 4 (four) hours as needed for wheezing or shortness of breath.    Yes Historical Provider, MD  albuterol (PROVENTIL) (2.5 MG/3ML)  0.083% nebulizer solution Take 2.5 mg by nebulization every 6 (six) hours as needed for wheezing or shortness of breath.   Yes Historical Provider, MD  brinzolamide (AZOPT) 1 % ophthalmic suspension Place 1 drop into both eyes 3 (three) times daily. 06/03/15  Yes Historical Provider, MD  budesonide-formoterol (SYMBICORT) 160-4.5 MCG/ACT inhaler Inhale 1 puff into the lungs 2 (two) times daily.   Yes Historical Provider, MD  cetirizine (ZYRTEC) 10 MG tablet Take 10 mg by mouth daily as needed.    Yes Historical Provider, MD  cyclobenzaprine (FLEXERIL) 10 MG tablet Take 10 mg by mouth 3 (three) times daily  as needed for muscle spasms.    Yes Historical Provider, MD  cycloSPORINE (RESTASIS) 0.05 % ophthalmic emulsion Place 1 drop into both eyes 2 (two) times daily.   Yes Historical Provider, MD  dicyclomine (BENTYL) 20 MG tablet Take 20 mg by mouth 4 (four) times daily.    Yes Historical Provider, MD  diphenhydrAMINE-zinc acetate (BENADRYL) cream Apply 1 application topically as needed for itching.   Yes Historical Provider, MD  hydrOXYzine (VISTARIL) 50 MG capsule Take 50 mg by mouth 4 (four) times daily as needed for anxiety or itching.  08/04/15  Yes Historical Provider, MD  lidocaine (XYLOCAINE) 2 % jelly Apply 1 application topically 2 (two) times daily as needed (Apply to affected area.).  08/26/15  Yes Historical Provider, MD  lidocaine (XYLOCAINE) 2 % solution Use as directed 5 mLs in the mouth or throat every 6 (six) hours as needed for mouth pain.  11/20/14  Yes Historical Provider, MD  nystatin (MYCOSTATIN) 100000 UNIT/ML suspension Use as directed 5 mLs in the mouth or throat 4 (four) times daily as needed (For mouth sores.).    Yes Historical Provider, MD  ondansetron (ZOFRAN ODT) 8 MG disintegrating tablet Take 1 tablet (8 mg total) by mouth every 8 (eight) hours as needed for nausea or vomiting. 01/21/16  Yes Jola Schmidt, MD  ondansetron (ZOFRAN) 4 MG tablet Take 8 mg by mouth every 8 (eight)  hours as needed for nausea or vomiting.  07/25/13  Yes Antonietta Breach, PA-C  oxycodone (ROXICODONE) 30 MG immediate release tablet Take 30 mg by mouth every 4 (four) hours as needed for pain.   Yes Historical Provider, MD  potassium chloride (K-DUR,KLOR-CON) 10 MEQ tablet Take 10 mEq by mouth daily.    Yes Historical Provider, MD  pregabalin (LYRICA) 50 MG capsule Take 1 capsule (50 mg total) by mouth 2 (two) times daily. Patient taking differently: Take 50 mg by mouth 2 (two) times daily as needed (For pain.).  07/03/15  Yes Kathrynn Ducking, MD  promethazine (PHENERGAN) 25 MG tablet Take 25 mg by mouth every 6 (six) hours as needed for nausea or vomiting.   Yes Historical Provider, MD  silver sulfADIAZINE (SILVADENE) 1 % cream Apply 1 application topically as needed (Apply to affected area.).   Yes Historical Provider, MD  solifenacin (VESICARE) 10 MG tablet Take 10 mg by mouth daily. 07/03/15  Yes Historical Provider, MD  thyroid (ARMOUR THYROID) 60 MG tablet Take 90 mg by mouth daily. Take one and a half tablets on Monday, Wednesday, Friday and one tablet the rest of the week. 09/07/15  Yes Historical Provider, MD    ROS:  Out of a complete 14 system review of symptoms, the patient complains only of the following symptoms, and all other reviewed systems are negative.  Decreased appetite, chills, fatigue, weight change Ringing in the ears, difficulty swallowing Eye redness, light sensitivity, double vision, eye pain, blurred vision Wheezing, shortness of breath, choking, chest tightness Chest pain, leg swelling Cold intolerance, excessive thirst Swollen abdomen, abdominal pain, rectal bleeding, black stools, constipation, nausea Restless legs, insomnia, sleep apnea, frequent waking, daytime sleepiness, snoring, sleep talking, acting out dreams Food allergies Difficulty urinating, frequency of urination, blood in the urine, urinary urgency Joint pain, joint swelling, back pain, achy muscles,  muscle cramps, walking difficulty, neck pain, neck stiffness Moles, itching Bruising easily, anemia Dizziness, headache, numbness, speech difficulty, weakness, tremors Agitation, confusion, decreased concentration  Blood pressure 139/87, pulse (!) 105, height 5\' 2"  (1.575 m),  weight 210 lb (95.3 kg).  Physical Exam  General: The patient is alert and cooperative at the time of the examination. The patient is moderately obese.  Neuromuscular: Range of movement a cervical spine lacks about 10 of full lateral rotation of the cervical spine bilaterally. Range of movement low back is full.  Skin: No significant peripheral edema is noted.   Neurologic Exam  Mental status: The patient is alert and oriented x 3 at the time of the examination. The patient has apparent normal recent and remote memory, with an apparently normal attention span and concentration ability.   Cranial nerves: Facial symmetry is present. Speech is normal, no aphasia or dysarthria is noted. Extraocular movements are full. Visual fields are full.  Motor: The patient has good strength in all 4 extremities.  Sensory examination: Soft touch sensation is decreased on the left face, arm, and leg.  Coordination: The patient has good finger-nose-finger and heel-to-shin bilaterally. No tremor was seen.  Gait and station: The patient has a normal gait. Tandem gait is normal. Romberg is negative. No drift is seen.  Reflexes: Deep tendon reflexes are symmetric.   MRI brain 12/28/2014:  IMPRESSION: This MRI of the brain without contrast shows the following: 1. There are a few scattered T2/FLAIR hyperintense foci in the subcortical white matter of the frontal lobes. This could be idiopathic or due to migraines or to small vessel ischemic changes.  2. The pituitary tissue is thin within a normal sized sella turcica and the optic nerve sheaths are widened. This could be idiopathic but could also be seen with idiopathic  intracranial hypertension in the right clinical setting.   * MRI scan images were reviewed online. I agree with the written report.    Assessment/Plan:  1. Fibromyalgia  2. Reported tremor  3. Left hemisensory deficit, nonorganic  The patient reports a tremor, but no tremor was seen on clinical examination today. The patient has ongoing reports of neuromuscular discomfort associated with her fibromyalgia. In the past, she has not been able to tolerate gabapentin and Cymbalta, Lyrica resulted in daytime drowsiness. We will give her a trial on low-dose nortriptyline. She will follow-up in 6 months, sooner if needed.  Jill Alexanders MD 02/21/2016 12:17 PM  [facility] [address] [place], [place] [address]  Phone [phone] Fax [phone]

## 2016-02-21 NOTE — Telephone Encounter (Signed)
Patient called 11:18:19 to advise she has sick child, not feeling well herself, just realized she has appointment today (11:30, to arrive 11:15) will be here shortly for appointment. Patient advised, will still be seen up to 10 minutes past appointment time, after that it will be up to the Doctor's discretion.

## 2016-06-19 DIAGNOSIS — Z0271 Encounter for disability determination: Secondary | ICD-10-CM

## 2016-08-24 ENCOUNTER — Ambulatory Visit: Payer: Medicaid Other | Admitting: Adult Health

## 2016-08-25 ENCOUNTER — Encounter: Payer: Self-pay | Admitting: Adult Health

## 2016-08-26 ENCOUNTER — Ambulatory Visit: Payer: Medicaid Other | Admitting: Adult Health

## 2016-10-20 ENCOUNTER — Encounter (HOSPITAL_COMMUNITY): Payer: Self-pay | Admitting: Emergency Medicine

## 2016-10-20 ENCOUNTER — Emergency Department (HOSPITAL_COMMUNITY): Payer: Medicaid Other

## 2016-10-20 ENCOUNTER — Emergency Department (HOSPITAL_COMMUNITY)
Admission: EM | Admit: 2016-10-20 | Discharge: 2016-10-20 | Disposition: A | Discharge status: Home / Self Care | Payer: Medicaid Other | Attending: Emergency Medicine | Admitting: Emergency Medicine

## 2016-10-20 DIAGNOSIS — M6281 Muscle weakness (generalized): Secondary | ICD-10-CM | POA: Diagnosis not present

## 2016-10-20 DIAGNOSIS — Z91040 Latex allergy status: Secondary | ICD-10-CM | POA: Diagnosis not present

## 2016-10-20 DIAGNOSIS — R202 Paresthesia of skin: Secondary | ICD-10-CM | POA: Diagnosis present

## 2016-10-20 DIAGNOSIS — M5416 Radiculopathy, lumbar region: Secondary | ICD-10-CM | POA: Insufficient documentation

## 2016-10-20 DIAGNOSIS — R471 Dysarthria and anarthria: Secondary | ICD-10-CM

## 2016-10-20 DIAGNOSIS — Z85850 Personal history of malignant neoplasm of thyroid: Secondary | ICD-10-CM | POA: Diagnosis not present

## 2016-10-20 DIAGNOSIS — J45909 Unspecified asthma, uncomplicated: Secondary | ICD-10-CM | POA: Insufficient documentation

## 2016-10-20 DIAGNOSIS — M5417 Radiculopathy, lumbosacral region: Secondary | ICD-10-CM

## 2016-10-20 DIAGNOSIS — E1140 Type 2 diabetes mellitus with diabetic neuropathy, unspecified: Secondary | ICD-10-CM | POA: Diagnosis not present

## 2016-10-20 DIAGNOSIS — I10 Essential (primary) hypertension: Secondary | ICD-10-CM | POA: Insufficient documentation

## 2016-10-20 DIAGNOSIS — Z79899 Other long term (current) drug therapy: Secondary | ICD-10-CM | POA: Diagnosis not present

## 2016-10-20 LAB — COMPREHENSIVE METABOLIC PANEL
ALT: 12 U/L — ABNORMAL LOW (ref 14–54)
AST: 15 U/L (ref 15–41)
Albumin: 4 g/dL (ref 3.5–5.0)
Alkaline Phosphatase: 80 U/L (ref 38–126)
Anion gap: 7 (ref 5–15)
BUN: 11 mg/dL (ref 6–20)
CO2: 32 mmol/L (ref 22–32)
Calcium: 9.2 mg/dL (ref 8.9–10.3)
Chloride: 104 mmol/L (ref 101–111)
Creatinine, Ser: 0.85 mg/dL (ref 0.44–1.00)
GFR calc Af Amer: >60 mL/min (ref >60)
GFR calc non Af Amer: >60 mL/min (ref >60)
Glucose, Bld: 110 mg/dL — ABNORMAL HIGH (ref 65–99)
Potassium: 3.4 mmol/L — ABNORMAL LOW (ref 3.5–5.1)
Sodium: 143 mmol/L (ref 135–145)
Total Bilirubin: 0.1 mg/dL — ABNORMAL LOW (ref 0.3–1.2)
Total Protein: 7.1 g/dL (ref 6.5–8.1)

## 2016-10-20 LAB — CBC WITH DIFFERENTIAL/PLATELET
Basophils Absolute: 0 10*3/uL (ref 0.0–0.1)
Basophils Relative: 0 %
Eosinophils Absolute: 0.1 10*3/uL (ref 0.0–0.7)
Eosinophils Relative: 1 %
HCT: 34.8 % — ABNORMAL LOW (ref 36.0–46.0)
Hemoglobin: 11.8 g/dL — ABNORMAL LOW (ref 12.0–15.0)
Lymphocytes Relative: 28 %
Lymphs Abs: 3.8 10*3/uL (ref 0.7–4.0)
MCH: 28.2 pg (ref 26.0–34.0)
MCHC: 33.9 g/dL (ref 30.0–36.0)
MCV: 83.1 fL (ref 78.0–100.0)
Monocytes Absolute: 1 10*3/uL (ref 0.1–1.0)
Monocytes Relative: 7 %
Neutro Abs: 8.4 10*3/uL — ABNORMAL HIGH (ref 1.7–7.7)
Neutrophils Relative %: 64 %
Platelets: 338 10*3/uL (ref 150–400)
RBC: 4.19 MIL/uL (ref 3.87–5.11)
RDW: 14.4 % (ref 11.5–15.5)
WBC: 13.3 10*3/uL — ABNORMAL HIGH (ref 4.0–10.5)

## 2016-10-20 LAB — POCT I-STAT TROPONIN I: Troponin i, poc: 0 ng/mL (ref 0.00–0.08)

## 2016-10-20 MED ORDER — OXYCODONE HCL 5 MG PO TABS
20.0000 mg | ORAL_TABLET | ORAL | Status: DC
  Filled 2016-10-20: qty 4

## 2016-10-20 MED ORDER — HYDROMORPHONE HCL 1 MG/ML IJ SOLN
1.0000 mg | Freq: Once | INTRAMUSCULAR | Status: AC
  Administered 2016-10-20: 1 mg via INTRAVENOUS
  Filled 2016-10-20: qty 1

## 2016-10-20 MED ORDER — LORAZEPAM 2 MG/ML IJ SOLN
1.0000 mg | Freq: Once | INTRAMUSCULAR | Status: AC
  Administered 2016-10-20: 1 mg via INTRAVENOUS
  Filled 2016-10-20: qty 1

## 2016-10-20 NOTE — ED Notes (Signed)
Unable to complete vitals assessment d/t pt completing MRI. [employer]

## 2016-10-20 NOTE — ED Notes (Signed)
Pt ambulatory w/steady gait, A&Ox4.  States that she feels comfortable going home.  Advised not to drive w/strong pain medications.  Denies any further quetsions.

## 2016-10-20 NOTE — ED Provider Notes (Signed)
[facility] DEPT Provider Note   CSN: [id number] Arrival date & time: 10/20/16  1727     History   Chief Complaint Chief Complaint  Patient presents with  . Weakness  . Numbness    HPI Alexis [name] is a 51 y.o. female.  HPI   Pt with hx fibromyalgia, chronic pain, DDD, chronic left sided numbness (thought to be nonorganic, Dr Jannifer Franklin, 02/2016) p/w multiple episodes of neurologic complaints throughout the day today.  States at 0245 this morning she developed numbness, heaviness, followed by pain in the left leg.  This improved then recoccured a few hours later in conjunction with hand tremors.  This also resolved shortly thereafter.  At 1300 she was on the phone and noticed her speech was slurred.  This also resolved.  Pt was seen at pain management this afternoon and they recommended coming to ED for workup.  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    Past Medical History:  Diagnosis Date  . Arthritis   . Asthma   . Bilateral dry eyes   . Cervical spondylosis    C5-6  . Chronic colitis   . Chronic pain   . Complication of anesthesia    has awakened in past/  RIGHT VOCAL CORD PARALYSIS  POST TOTAL THYROIDECTOMY  03-08-2009 ([facility])  . Degenerative disk disease   . Diabetic neuropathy, type II diabetes mellitus ([place]) 12/14/2014  . DJD (degenerative joint disease) of knee   . Dyspnea   . Endometriosis 01/18/01  . Fibromyalgia   . GERD (gastroesophageal reflux disease)   . GI bleed   . Glaucoma of both eyes   . H/O hiatal hernia   . History of colon polyps   . History of thyroid cancer    2010--  TOTAL THYROIDECTOMY--  NO RECURRENCE  . History of thyroiditis   . Hypertension   . Hypothyroidism, postsurgical   . IBS (irritable bowel syndrome)   . Interstitial cystitis   . Lymphedema   . OSA on CPAP   . Right knee meniscal tear   . Sickle cell trait ([place])   . Sickle cell trait ([place])   .  Sjogren's syndrome ([place])   . Tremor 12/14/2014  . Uterine fibroid   . Varicose veins   . Vocal cord paralysis, unilateral complete    RIGHT--  POST TOTAL THYROIDECTOMY    Patient Active Problem List   Diagnosis Date Noted  . Fibromyalgia 02/21/2016  . Tremor 12/14/2014  . Diabetic neuropathy, type II diabetes mellitus ([place]) 12/14/2014  . Varicose veins of lower extremities with other complications 99/35/7017  . Leg swelling 07/25/2012  . SOB (shortness of breath) 02/22/2012  . Chronic pelvic pain in female 06/26/2000    Past Surgical History:  Procedure Laterality Date  . ACHILLES TENDON REPAIR Left 2007  . BREAST ENHANCEMENT SURGERY Bilateral 1997  . BUNIONECTOMY WITH HAMMERTOE RECONSTRUCTION AND GASTROC SLIDE Left FEB  2011   REMOVAL HARDWARE  NOV  2011  . CARDIAC CATHETERIZATION N/A 10/28/2015   Procedure: Right Heart Cath;  Surgeon: Adrian Prows, MD;  Location: [facility];  Service: Cardiovascular;  Laterality: N/A;  . COLONOSCOPY WITH ESOPHAGOGASTRODUODENOSCOPY (EGD)  MULTIPLE --  LAST ONE   NOV  2014  . CYSTO/  BILATERAL RETROGRADE PYELOGRAM/  HYDRODISTENTION/  INSTILLATION THERAPY  05-08-2003  . CYSTO/  HYDRODISTENTION/  INSTILLATION THERAPY  06-04-2005  &  03-04-2007  .  CYSTO/  URETHRAL DILATION /  BILATERAL UNROOFING URETEROCELE/  BILATERAL URETERAL STENT PLACEMENT  12-01-2002  . D & C HYSTEROSCOPY/  REMOVAL IUD  12-06-2003  . DILATATION AND CURETTAGE WITH SUCTION  05-29-2005   RETAINED PLACENTA  . DX LAPAROSCOPY/  PELVIC BX'S/  LYSIS ADHESIONS  06-26-2000  . HYDRADENITIS EXCISION  06/29/2011   Procedure: EXCISION HYDRADENITIS AXILLA;  Surgeon: Hermelinda Dellen, MD;  Location: [place];  Service: Plastics;;  right breast hydradenitis excioion  . HYDRADENITIS EXCISION Left 2000  . KNEE ARTHROSCOPY WITH LATERAL MENISECTOMY Right 09/08/2013   Procedure: RIGHT KNEE ARTHROSCOPY PARTIAL LATERAL MENISCECTOMY AND DEBRIDEMENT ;  Surgeon: Johnn Hai, MD;   Location: [place];  Service: Orthopedics;  Laterality: Right;  . LAPAROSCOPY ABDOMEN DIAGNOSTIC  SEPT  2003   [place]   AND PLACEMENT IUD  . LEFT WRIST TENDON RELEASE  MAY  2007  . PILONIDAL CYST EXCISION  1983  . REMOVAL PERIANAL ABSCESS  02-14-2001  . TEE WITHOUT CARDIOVERSION N/A 09/26/2015   Procedure: TRANSESOPHAGEAL ECHOCARDIOGRAM (TEE);  Surgeon: Adrian Prows, MD;  Location: [facility];  Service: Cardiovascular;  Laterality: N/A;  . TEMPORAL ARTERY BIOPSY / LIGATION  2010  . TEMPORAL ARTERY BIOPSY / LIGATION Right 06-16-2002  . TONSILLECTOMY  1990  . TOTAL THYROIDECTOMY  03-08-2009  . VEIN SURGERY    . WIDE EXCISION MASS,  CHEST AREA  10-29-2003    OB History    Gravida Para Term Preterm AB Living   1 1       1    SAB TAB Ectopic Multiple Live Births           1       Home Medications    Prior to Admission medications   Medication Sig Start Date End Date Taking? Authorizing Provider  albuterol (PROVENTIL HFA;VENTOLIN HFA) 108 (90 BASE) MCG/ACT inhaler Inhale 2 puffs into the lungs every 4 (four) hours as needed for wheezing or shortness of breath.    Yes [provider]  albuterol (PROVENTIL) (2.5 MG/3ML) 0.083% nebulizer solution Take 2.5 mg by nebulization every 6 (six) hours as needed for wheezing or shortness of breath.   Yes [provider]  ARMOUR THYROID 120 MG tablet Take 120 mg by mouth daily. 09/25/16  Yes [provider]  brinzolamide (AZOPT) 1 % ophthalmic suspension Place 1 drop into both eyes 3 (three) times daily. 06/03/15  Yes [provider]  budesonide-formoterol (SYMBICORT) 160-4.5 MCG/ACT inhaler Inhale 1 puff into the lungs 2 (two) times daily.   Yes [provider]  cetirizine (ZYRTEC) 10 MG tablet Take 10 mg by mouth daily.    Yes [provider]  cyclobenzaprine (FLEXERIL) 10 MG tablet Take 10 mg by mouth 3 (three) times daily as needed for muscle spasms.    Yes [provider]  cycloSPORINE (RESTASIS) 0.05 % ophthalmic emulsion Place 1 drop into both eyes 2 (two) times daily.   Yes [provider]  dicyclomine (BENTYL) 10 MG capsule TAKE 2 CAPSULE 4 TIMES DAILY 08/11/16  Yes [provider]  hydrOXYzine (ATARAX/VISTARIL) 50 MG tablet Take 50 mg by mouth 4 (four) times daily. 09/15/16  Yes [provider]  lidocaine (XYLOCAINE) 2 % solution Use as directed 5 mLs in the mouth or throat every 6 (six) hours as needed for mouth pain.  11/20/14  Yes [provider]  LYRICA 75 MG capsule Take 75 mg by mouth at bedtime.  07/24/16  Yes [provider]  omeprazole-sodium bicarbonate (ZEGERID) 40-1100 MG capsule Take 1 capsule by mouth daily. 10/11/16  Yes [provider]  ondansetron (ZOFRAN) 4 MG tablet Take 8 mg by mouth every 8 (eight) hours as needed for nausea or vomiting.  07/25/13  Yes Antonietta Breach, PA-C  oxycodone (ROXICODONE) 30 MG immediate release tablet Take 30 mg by mouth every 4 (four) hours as needed for pain.   Yes [provider]  predniSONE (DELTASONE) 20 MG tablet Take 20 mg by mouth every other day. 10/11/16  Yes [provider]  promethazine (PHENERGAN) 25 MG tablet Take 25 mg by mouth every 6 (six) hours as needed for nausea or vomiting.   Yes [provider]  solifenacin (VESICARE) 10 MG tablet Take 10 mg by mouth at bedtime.  07/03/15  Yes [provider]  XTAMPZA ER 27 MG C12A TAKE ONE CAPSULE EVERY 12 HOURS WITH FOOD 10/15/16  Yes [provider]  D3-50 50000 units capsule Take 50,000 Units by mouth once a week. 08/26/16   [provider]  diphenhydrAMINE-zinc acetate (BENADRYL) cream Apply 1 application topically as needed for itching.    [provider]  lidocaine (XYLOCAINE) 2 % jelly Apply 1 application topically 2 (two) times daily as needed (Apply to affected area.).  08/26/15   [provider]  MOVANTIK 25 MG TABS tablet TAKE  1 TABLET(S) BY MOUTH DAILY ON EMPTY STOMACH 1 HOUR BEFORE OR 2 HOURS AFTER FIRST MEAL. 09/16/16   [provider]  nortriptyline (PAMELOR) 10 MG capsule Take one capsule at night for one week, then take 2 capsules at night for one week, then take 3 capsules at night Patient not taking: Reported on 10/20/2016 02/21/16   Kathrynn Ducking, MD  nystatin (MYCOSTATIN) 100000 UNIT/ML suspension Use as directed 5 mLs in the mouth or throat 4 (four) times daily as needed (For mouth sores.).     [provider]  ondansetron (ZOFRAN ODT) 8 MG disintegrating tablet Take 1 tablet (8 mg total) by mouth every 8 (eight) hours as needed for nausea or vomiting. Patient not taking: Reported on 10/20/2016 01/21/16   Jola Schmidt, MD  pregabalin (LYRICA) 50 MG capsule Take 1 capsule (50 mg total) by mouth 2 (two) times daily. Patient not taking: Reported on 10/20/2016 07/03/15   Kathrynn Ducking, MD  silver sulfADIAZINE (SILVADENE) 1 % cream Apply 1 application topically as needed (Apply to affected area.).    [provider]    Family History Family History  Problem Relation Age of Onset  . Atrial fibrillation Mother   . Diabetes Mother   . Thyroid disease Mother   . Cushing syndrome Mother   . Tremor Mother   . Atrial fibrillation Father   . Glaucoma Father   . Tremor Sister   . Cancer Paternal Aunt        breast, colon    Social History Social History  Substance Use Topics  . Smoking status: Never Smoker  . Smokeless tobacco: Never Used  . Alcohol use 0.0 oz/week     Comment: very rarely     Allergies   Adhesive [tape]; Bactrim; Clarithromycin; Ibuprofen; Nexium [esomeprazole]; Nsaids; Other; Peg 3350-electrolytes; Sulfa antibiotics; Tolmetin; and Latex   Review of Systems Review of Systems  All other systems reviewed and are negative.    Physical Exam Updated Vital Signs BP 127/85   Pulse 93   Temp 98.2 F (36.8 C) (Oral)   Resp (!) 21  Ht 5\' 4"  (1.626 m)   Wt  97.1 kg (214 lb)   LMP 07/25/2014 Comment: negative urine pregnancy test 08/07/14  SpO2 98%   BMI 36.73 kg/m   Physical Exam  Constitutional: She appears well-developed and well-nourished. No distress.  HENT:  Head: Normocephalic and atraumatic.  Neck: Neck supple.  Cardiovascular: Normal rate and regular rhythm.   Pulmonary/Chest: Effort normal and breath sounds normal. No respiratory distress. She has no wheezes. She has no rales.  Abdominal: Soft. She exhibits no distension. There is no tenderness. There is no rebound and no guarding.  Neurological: She is alert.  CN II-XII intact (chronic decreased sensation left face, unchanged), EOMs intact, no pronator drift, grip strengths equal bilaterally; strength 5/5 in all extremities, sensation intact in all extremities though chronically decreased (and unchanged from baseline) on left; finger to nose, heel to shin, rapid alternating movements normal.     Skin: She is not diaphoretic.  Nursing note and vitals reviewed.    ED Treatments / Results  Labs (all labs ordered are listed, but only abnormal results are displayed) Labs Reviewed  COMPREHENSIVE METABOLIC PANEL - Abnormal; Notable for the following:       Result Value   Potassium 3.4 (*)    Glucose, Bld 110 (*)    ALT 12 (*)    Total Bilirubin 0.1 (*)    All other components within normal limits  CBC WITH DIFFERENTIAL/PLATELET - Abnormal; Notable for the following:    WBC 13.3 (*)    Hemoglobin 11.8 (*)    HCT 34.8 (*)    Neutro Abs 8.4 (*)    All other components within normal limits  I-STAT TROPOININ, ED  POCT I-STAT TROPONIN I    EKG  EKG Interpretation None       Radiology Dg Chest 2 View  Result Date: 10/20/2016 CLINICAL DATA:  Chronic shortness-of-breath with left-sided numbness. EXAM: CHEST  2 VIEW COMPARISON:  01/21/2016 FINDINGS: Lungs are clear. Cardiomediastinal silhouette is within normal. Mild degenerate change of the spine. Surgical clips over the neck  compatible with thyroidectomy. IMPRESSION: No active cardiopulmonary disease. Electronically Signed   By: Marin Olp M.D.   On: 10/20/2016 19:17   Ct Head Wo Contrast  Result Date: 10/20/2016 CLINICAL DATA:  Stroke-like symptoms. Left-sided weakness and numbness. Slurred speech. EXAM: CT HEAD WITHOUT CONTRAST TECHNIQUE: Contiguous axial images were obtained from the base of the skull through the vertex without intravenous contrast. COMPARISON:  09/04/2013 FINDINGS: Brain: No evidence of acute infarction, hemorrhage, hydrocephalus, extra-axial collection or mass lesion/mass effect. Vascular: No hyperdense vessel or unexpected calcification. Skull: No osseous abnormality. Sinuses/Orbits: Visualized paranasal sinuses are clear. Visualized mastoid sinuses are clear. Visualized orbits demonstrate no focal abnormality. Other: None IMPRESSION: No acute intracranial pathology. Electronically Signed   By: Kathreen Devoid   On: 10/20/2016 20:10   Mr Brain Wo Contrast  Result Date: 10/20/2016 CLINICAL DATA:  Initial evaluation for intermittent left leg numbness and weakness, left hand tremor, dysarthria. EXAM: MRI HEAD WITHOUT CONTRAST TECHNIQUE: Multiplanar, multiecho pulse sequences of the brain and surrounding structures were obtained without intravenous contrast. COMPARISON:  Prior CT from earlier the same day as well as previous MRI from 12/28/2014. FINDINGS: Brain: Study moderately degraded by motion artifact. Cerebral volume within normal limits for age. No significant cerebral white matter disease identified. No abnormal foci of restricted diffusion to suggest acute or subacute ischemia. Gray-white matter differentiation well maintained. No areas of chronic infarction identified. No evidence for acute or  chronic intracranial hemorrhage. No mass lesion, midline shift or mass effect. Ventricles normal size without evidence for hydrocephalus. No extra-axial fluid collection. Major dural sinuses are grossly patent.  Incidental note made of an empty sella. Suprasellar region normal. Midline structures intact and normal. Vascular: Major intracranial vascular flow voids maintained. Skull and upper cervical spine: Craniocervical junction normal. Visualized upper cervical spine unremarkable. Bone marrow signal intensity within normal limits. No scalp soft tissue abnormality. Sinuses/Orbits: Globes and orbital soft tissues grossly normal. Paranasal sinuses are clear. No mastoid effusion. Inner ear structures normal. Other: None. IMPRESSION: Normal MRI of the brain.  No acute intracranial process identified. Electronically Signed   By: Jeannine Boga M.D.   On: 10/20/2016 22:23    Procedures Procedures (including critical care time)  Medications Ordered in ED Medications  LORazepam (ATIVAN) injection 1 mg (1 mg Intravenous Given 10/20/16 2115)  HYDROmorphone (DILAUDID) injection 1 mg (1 mg Intravenous Given 10/20/16 2226)     Initial Impression / Assessment and Plan / ED Course  I have reviewed the triage vital signs and the nursing notes.  Pertinent labs & imaging results that were available during my care of the patient were reviewed by me and considered in my medical decision making (see chart for details).     Afebrile nontoxic patient with several episodes of neurologic symptoms, some of which seem related to her chronic problems.  Her initial symptoms of left leg weakness and numbness was accompanied by severe pain - this is likely related to radiculopathy.  She then experienced tremors in her hand, which has also happened to her chronically.  The dysarthria may be new, but she also has known chronic vocal cord dysfunction.  She never had any deficits while in ED and only complaints were about her chronic pain.  Workup was reassuring, including negative MRI brain.   Pt also seen and assessed by Dr Rogene Houston.  Considered TIA , though we suspect this is more likely related to exacerbations of chronic  symptoms.  Pt given strict return precautions.  Plan is for d/c home with neurology and PCP follow up.  Discussed result, findings, treatment, and follow up  with patient.  Pt given return precautions.  Pt verbalizes understanding and agrees with plan.      Final Clinical Impressions(s) / ED Diagnoses   Final diagnoses:  Lumbosacral radiculopathy  Paresthesia  Dysarthria    New Prescriptions New Prescriptions   No medications on file     Clayton Bibles, Hershal Coria 10/20/16 2311    Fredia Sorrow, MD 10/23/16 [phone]

## 2016-10-20 NOTE — ED Provider Notes (Addendum)
Medical screening examination/treatment/procedure(s) were conducted as a shared visit with non-physician practitioner(s) and myself.  I personally evaluated the patient during the encounter.   EKG Interpretation None       Results for orders placed or performed during the hospital encounter of 10/20/16  Comprehensive metabolic panel  Result Value Ref Range   Sodium 143 135 - 145 mmol/L   Potassium 3.4 (L) 3.5 - 5.1 mmol/L   Chloride 104 101 - 111 mmol/L   CO2 32 22 - 32 mmol/L   Glucose, Bld 110 (H) 65 - 99 mg/dL   BUN 11 6 - 20 mg/dL   Creatinine, Ser 0.85 0.44 - 1.00 mg/dL   Calcium 9.2 8.9 - 10.3 mg/dL   Total Protein 7.1 6.5 - 8.1 g/dL   Albumin 4.0 3.5 - 5.0 g/dL   AST 15 15 - 41 U/L   ALT 12 (L) 14 - 54 U/L   Alkaline Phosphatase 80 38 - 126 U/L   Total Bilirubin 0.1 (L) 0.3 - 1.2 mg/dL   GFR calc non Af Amer >60 >60 mL/min   GFR calc Af Amer >60 >60 mL/min   Anion gap 7 5 - 15  CBC with Differential  Result Value Ref Range   WBC 13.3 (H) 4.0 - 10.5 K/uL   RBC 4.19 3.87 - 5.11 MIL/uL   Hemoglobin 11.8 (L) 12.0 - 15.0 g/dL   HCT 34.8 (L) 36.0 - 46.0 %   MCV 83.1 78.0 - 100.0 fL   MCH 28.2 26.0 - 34.0 pg   MCHC 33.9 30.0 - 36.0 g/dL   RDW 14.4 11.5 - 15.5 %   Platelets 338 150 - 400 K/uL   Neutrophils Relative % 64 %   Neutro Abs 8.4 (H) 1.7 - 7.7 K/uL   Lymphocytes Relative 28 %   Lymphs Abs 3.8 0.7 - 4.0 K/uL   Monocytes Relative 7 %   Monocytes Absolute 1.0 0.1 - 1.0 K/uL   Eosinophils Relative 1 %   Eosinophils Absolute 0.1 0.0 - 0.7 K/uL   Basophils Relative 0 %   Basophils Absolute 0.0 0.0 - 0.1 K/uL  POCT i-Stat troponin I  Result Value Ref Range   Troponin i, poc 0.00 0.00 - 0.08 ng/mL   Comment 3           Dg Chest 2 View  Result Date: 10/20/2016 CLINICAL DATA:  Chronic shortness-of-breath with left-sided numbness. EXAM: CHEST  2 VIEW COMPARISON:  01/21/2016 FINDINGS: Lungs are clear. Cardiomediastinal silhouette is within normal. Mild degenerate  change of the spine. Surgical clips over the neck compatible with thyroidectomy. IMPRESSION: No active cardiopulmonary disease. Electronically Signed   By: Marin Olp M.D.   On: 10/20/2016 19:17   Ct Head Wo Contrast  Result Date: 10/20/2016 CLINICAL DATA:  Stroke-like symptoms. Left-sided weakness and numbness. Slurred speech. EXAM: CT HEAD WITHOUT CONTRAST TECHNIQUE: Contiguous axial images were obtained from the base of the skull through the vertex without intravenous contrast. COMPARISON:  09/04/2013 FINDINGS: Brain: No evidence of acute infarction, hemorrhage, hydrocephalus, extra-axial collection or mass lesion/mass effect. Vascular: No hyperdense vessel or unexpected calcification. Skull: No osseous abnormality. Sinuses/Orbits: Visualized paranasal sinuses are clear. Visualized mastoid sinuses are clear. Visualized orbits demonstrate no focal abnormality. Other: None IMPRESSION: No acute intracranial pathology. Electronically Signed   By: Kathreen Devoid   On: 10/20/2016 20:10   Mr Brain Wo Contrast  Result Date: 10/20/2016 CLINICAL DATA:  Initial evaluation for intermittent left leg numbness and weakness, left hand tremor,  dysarthria. EXAM: MRI HEAD WITHOUT CONTRAST TECHNIQUE: Multiplanar, multiecho pulse sequences of the brain and surrounding structures were obtained without intravenous contrast. COMPARISON:  Prior CT from earlier the same day as well as previous MRI from 12/28/2014. FINDINGS: Brain: Study moderately degraded by motion artifact. Cerebral volume within normal limits for age. No significant cerebral white matter disease identified. No abnormal foci of restricted diffusion to suggest acute or subacute ischemia. Gray-white matter differentiation well maintained. No areas of chronic infarction identified. No evidence for acute or chronic intracranial hemorrhage. No mass lesion, midline shift or mass effect. Ventricles normal size without evidence for hydrocephalus. No extra-axial fluid  collection. Major dural sinuses are grossly patent. Incidental note made of an empty sella. Suprasellar region normal. Midline structures intact and normal. Vascular: Major intracranial vascular flow voids maintained. Skull and upper cervical spine: Craniocervical junction normal. Visualized upper cervical spine unremarkable. Bone marrow signal intensity within normal limits. No scalp soft tissue abnormality. Sinuses/Orbits: Globes and orbital soft tissues grossly normal. Paranasal sinuses are clear. No mastoid effusion. Inner ear structures normal. Other: None. IMPRESSION: Normal MRI of the brain.  No acute intracranial process identified. Electronically Signed   By: Jeannine Boga M.D.   On: 10/20/2016 22:23     Patient followed by Dr. Jannifer Franklin of [facility] neurology. Patient seen by me along with the physician assistant.  Patient has a history of fibromyalgia chronic pain history according to their notes of the chronic left-sided numbness.  Patient had 2 new onset symptoms today had some stuttering speech problems for about 30 minutes and had numbness to the left leg and heaviness feeling lasting about 30 minutes. All symptoms have since resolved.  Patient states that the leg symptoms occurred about 2 in the morning and that the speech problems occurred this afternoon.   MRI was negative. Possible his symptoms could be related to a TIA but based on her past history and neurological complaints in the past feel the patient is stable for discharge home. And close follow-up with her neurologist.   Fredia Sorrow, MD 10/20/16 2259    Fredia Sorrow, MD 10/20/16 [phone]

## 2016-10-20 NOTE — Discharge Instructions (Signed)
Read the information below.  You may return to the Emergency Department at any time for worsening condition or any new symptoms that concern you.   SEEK IMMEDIATE MEDICAL ATTENTION (Call 911) IF: The original symptoms that brought you in are getting worse, or if you develop any new change in speech, vision, swallowing, or understanding, incoordination, weakness, numbness, tingling, dizziness, fainting, severe headache, chest pain, or other concerns.

## 2016-10-20 NOTE — ED Triage Notes (Signed)
Patient complaining of numbess of left leg that started at 0245 am. Patient states the whole left side began to come at 8 am. Patient is having numbness from shoulder to foot.

## 2017-05-16 ENCOUNTER — Inpatient Hospital Stay (HOSPITAL_COMMUNITY)
Admission: EM | Admit: 2017-05-16 | Discharge: 2017-05-18 | DRG: 395 | Disposition: A | Discharge status: Home / Self Care | Payer: Medicaid Other | Attending: Internal Medicine | Admitting: Internal Medicine

## 2017-05-16 ENCOUNTER — Other Ambulatory Visit: Payer: Self-pay

## 2017-05-16 ENCOUNTER — Encounter (HOSPITAL_COMMUNITY): Payer: Self-pay | Admitting: Emergency Medicine

## 2017-05-16 DIAGNOSIS — Z8349 Family history of other endocrine, nutritional and metabolic diseases: Secondary | ICD-10-CM

## 2017-05-16 DIAGNOSIS — R112 Nausea with vomiting, unspecified: Secondary | ICD-10-CM | POA: Diagnosis not present

## 2017-05-16 DIAGNOSIS — Z79890 Hormone replacement therapy (postmenopausal): Secondary | ICD-10-CM

## 2017-05-16 DIAGNOSIS — Z7951 Long term (current) use of inhaled steroids: Secondary | ICD-10-CM

## 2017-05-16 DIAGNOSIS — Z888 Allergy status to other drugs, medicaments and biological substances status: Secondary | ICD-10-CM

## 2017-05-16 DIAGNOSIS — R102 Pelvic and perineal pain: Secondary | ICD-10-CM | POA: Diagnosis not present

## 2017-05-16 DIAGNOSIS — Z833 Family history of diabetes mellitus: Secondary | ICD-10-CM

## 2017-05-16 DIAGNOSIS — K219 Gastro-esophageal reflux disease without esophagitis: Secondary | ICD-10-CM | POA: Diagnosis present

## 2017-05-16 DIAGNOSIS — K625 Hemorrhage of anus and rectum: Secondary | ICD-10-CM

## 2017-05-16 DIAGNOSIS — K648 Other hemorrhoids: Principal | ICD-10-CM | POA: Diagnosis present

## 2017-05-16 DIAGNOSIS — Z23 Encounter for immunization: Secondary | ICD-10-CM

## 2017-05-16 DIAGNOSIS — B9622 Other specified Shiga toxin-producing Escherichia coli [E. coli] (STEC) as the cause of diseases classified elsewhere: Secondary | ICD-10-CM | POA: Diagnosis present

## 2017-05-16 DIAGNOSIS — G8929 Other chronic pain: Secondary | ICD-10-CM | POA: Diagnosis present

## 2017-05-16 DIAGNOSIS — Z91048 Other nonmedicinal substance allergy status: Secondary | ICD-10-CM

## 2017-05-16 DIAGNOSIS — Z883 Allergy status to other anti-infective agents status: Secondary | ICD-10-CM

## 2017-05-16 DIAGNOSIS — Z85850 Personal history of malignant neoplasm of thyroid: Secondary | ICD-10-CM

## 2017-05-16 DIAGNOSIS — J3801 Paralysis of vocal cords and larynx, unilateral: Secondary | ICD-10-CM | POA: Diagnosis present

## 2017-05-16 DIAGNOSIS — G4733 Obstructive sleep apnea (adult) (pediatric): Secondary | ICD-10-CM | POA: Diagnosis present

## 2017-05-16 DIAGNOSIS — Z79899 Other long term (current) drug therapy: Secondary | ICD-10-CM

## 2017-05-16 DIAGNOSIS — H409 Unspecified glaucoma: Secondary | ICD-10-CM | POA: Diagnosis present

## 2017-05-16 DIAGNOSIS — E1140 Type 2 diabetes mellitus with diabetic neuropathy, unspecified: Secondary | ICD-10-CM | POA: Diagnosis present

## 2017-05-16 DIAGNOSIS — E860 Dehydration: Secondary | ICD-10-CM | POA: Diagnosis present

## 2017-05-16 DIAGNOSIS — I10 Essential (primary) hypertension: Secondary | ICD-10-CM | POA: Diagnosis present

## 2017-05-16 DIAGNOSIS — M3500 Sicca syndrome, unspecified: Secondary | ICD-10-CM | POA: Diagnosis present

## 2017-05-16 DIAGNOSIS — D573 Sickle-cell trait: Secondary | ICD-10-CM | POA: Diagnosis present

## 2017-05-16 DIAGNOSIS — K529 Noninfective gastroenteritis and colitis, unspecified: Secondary | ICD-10-CM | POA: Diagnosis present

## 2017-05-16 DIAGNOSIS — Z86010 Personal history of colonic polyps: Secondary | ICD-10-CM

## 2017-05-16 DIAGNOSIS — E890 Postprocedural hypothyroidism: Secondary | ICD-10-CM | POA: Diagnosis present

## 2017-05-16 DIAGNOSIS — M797 Fibromyalgia: Secondary | ICD-10-CM | POA: Diagnosis present

## 2017-05-16 DIAGNOSIS — Z886 Allergy status to analgesic agent status: Secondary | ICD-10-CM

## 2017-05-16 DIAGNOSIS — Z7952 Long term (current) use of systemic steroids: Secondary | ICD-10-CM

## 2017-05-16 DIAGNOSIS — Z91040 Latex allergy status: Secondary | ICD-10-CM

## 2017-05-16 DIAGNOSIS — Z881 Allergy status to other antibiotic agents status: Secondary | ICD-10-CM

## 2017-05-16 DIAGNOSIS — Z79891 Long term (current) use of opiate analgesic: Secondary | ICD-10-CM

## 2017-05-16 DIAGNOSIS — Z83511 Family history of glaucoma: Secondary | ICD-10-CM

## 2017-05-16 HISTORY — DX: Dorsalgia, unspecified: M54.9

## 2017-05-16 HISTORY — DX: Unspecified osteoarthritis, unspecified site: M19.90

## 2017-05-16 HISTORY — DX: Unspecified disorder of vestibular function, unspecified ear: H81.90

## 2017-05-16 HISTORY — DX: Dizziness and giddiness: R42

## 2017-05-16 HISTORY — DX: Malignant neoplasm of thyroid gland: C73

## 2017-05-16 HISTORY — DX: Other chronic pain: G89.29

## 2017-05-16 HISTORY — DX: Gastric ulcer, unspecified as acute or chronic, without hemorrhage or perforation: K25.9

## 2017-05-16 HISTORY — DX: Family history of other specified conditions: Z84.89

## 2017-05-16 HISTORY — DX: Pure hypercholesterolemia, unspecified: E78.00

## 2017-05-16 HISTORY — DX: Gastrointestinal hemorrhage, unspecified: K92.2

## 2017-05-16 HISTORY — DX: Heart failure, unspecified: I50.9

## 2017-05-16 HISTORY — DX: Migraine, unspecified, not intractable, without status migrainosus: G43.909

## 2017-05-16 HISTORY — DX: Other specified disorders of adrenal gland: E27.8

## 2017-05-16 HISTORY — DX: Unspecified chronic bronchitis: J42

## 2017-05-16 LAB — URINALYSIS, ROUTINE W REFLEX MICROSCOPIC
Bilirubin Urine: NEGATIVE
Glucose, UA: 50 mg/dL — AB
Ketones, ur: 20 mg/dL — AB
Leukocytes, UA: NEGATIVE
Nitrite: NEGATIVE
Protein, ur: 30 mg/dL — AB
Specific Gravity, Urine: 1.014 (ref 1.005–1.030)
pH: 6 (ref 5.0–8.0)

## 2017-05-16 LAB — COMPREHENSIVE METABOLIC PANEL
ALT: 12 U/L — ABNORMAL LOW (ref 14–54)
AST: 16 U/L (ref 15–41)
Albumin: 4.4 g/dL (ref 3.5–5.0)
Alkaline Phosphatase: 108 U/L (ref 38–126)
Anion gap: 11 (ref 5–15)
BUN: 7 mg/dL (ref 6–20)
CO2: 24 mmol/L (ref 22–32)
Calcium: 10.1 mg/dL (ref 8.9–10.3)
Chloride: 106 mmol/L (ref 101–111)
Creatinine, Ser: 0.76 mg/dL (ref 0.44–1.00)
GFR calc Af Amer: >60 mL/min (ref >60)
GFR calc non Af Amer: >60 mL/min (ref >60)
Glucose, Bld: 113 mg/dL — ABNORMAL HIGH (ref 65–99)
Potassium: 3.1 mmol/L — ABNORMAL LOW (ref 3.5–5.1)
Sodium: 141 mmol/L (ref 135–145)
Total Bilirubin: 0.8 mg/dL (ref 0.3–1.2)
Total Protein: 8.7 g/dL — ABNORMAL HIGH (ref 6.5–8.1)

## 2017-05-16 LAB — LIPASE, BLOOD: Lipase: 26 U/L (ref 11–51)

## 2017-05-16 LAB — CBC
HCT: 43.5 % (ref 36.0–46.0)
Hemoglobin: 14.7 g/dL (ref 12.0–15.0)
MCH: 29.1 pg (ref 26.0–34.0)
MCHC: 33.8 g/dL (ref 30.0–36.0)
MCV: 86 fL (ref 78.0–100.0)
Platelets: 477 10*3/uL — ABNORMAL HIGH (ref 150–400)
RBC: 5.06 MIL/uL (ref 3.87–5.11)
RDW: 13.8 % (ref 11.5–15.5)
WBC: 15.4 10*3/uL — ABNORMAL HIGH (ref 4.0–10.5)

## 2017-05-16 LAB — I-STAT BETA HCG BLOOD, ED (MC, WL, AP ONLY): I-stat hCG, quantitative: <5 m[IU]/mL (ref <5)

## 2017-05-16 MED ORDER — SODIUM CHLORIDE 0.9% FLUSH: 3.0000 mL | INTRAVENOUS | Status: DC | PRN

## 2017-05-16 MED ORDER — IOPAMIDOL (ISOVUE-300) INJECTION 61%
INTRAVENOUS | Status: AC
  Filled 2017-05-16: qty 30

## 2017-05-16 MED ORDER — PREGABALIN 75 MG PO CAPS
75.0000 mg | ORAL_CAPSULE | Freq: Every day | ORAL | Status: DC
  Administered 2017-05-17: 75 mg via ORAL
  Filled 2017-05-16: qty 3
  Filled 2017-05-16: qty 1

## 2017-05-16 MED ORDER — MORPHINE SULFATE (PF) 4 MG/ML IV SOLN
4.0000 mg | Freq: Once | INTRAVENOUS | Status: AC
  Administered 2017-05-16: 4 mg via INTRAVENOUS
  Filled 2017-05-16: qty 1

## 2017-05-16 MED ORDER — OXYCODONE HCL 5 MG PO TABS
30.0000 mg | ORAL_TABLET | ORAL | Status: DC | PRN
  Administered 2017-05-17 – 2017-05-18 (×7): 30 mg via ORAL
  Filled 2017-05-16 (×7): qty 6

## 2017-05-16 MED ORDER — POTASSIUM CHLORIDE CRYS ER 20 MEQ PO TBCR
20.0000 meq | EXTENDED_RELEASE_TABLET | Freq: Once | ORAL | Status: AC
  Administered 2017-05-16: 20 meq via ORAL
  Filled 2017-05-16: qty 1

## 2017-05-16 MED ORDER — VITAMIN D (ERGOCALCIFEROL) 1.25 MG (50000 UNIT) PO CAPS: 50000.0000 [IU] | ORAL_CAPSULE | ORAL | Status: DC

## 2017-05-16 MED ORDER — CYCLOSPORINE 0.05 % OP EMUL
1.0000 [drp] | Freq: Two times a day (BID) | OPHTHALMIC | Status: DC
  Administered 2017-05-17 – 2017-05-18 (×3): 1 [drp] via OPHTHALMIC
  Filled 2017-05-16 (×4): qty 1

## 2017-05-16 MED ORDER — SODIUM CHLORIDE 0.9 % IV BOLUS (SEPSIS)
1000.0000 mL | Freq: Once | INTRAVENOUS | Status: AC
  Administered 2017-05-16: 1000 mL via INTRAVENOUS

## 2017-05-16 MED ORDER — CYCLOBENZAPRINE HCL 10 MG PO TABS: 10.0000 mg | ORAL_TABLET | Freq: Three times a day (TID) | ORAL | Status: DC | PRN

## 2017-05-16 MED ORDER — ALBUTEROL SULFATE HFA 108 (90 BASE) MCG/ACT IN AERS: 2.0000 | INHALATION_SPRAY | RESPIRATORY_TRACT | Status: DC | PRN

## 2017-05-16 MED ORDER — BRINZOLAMIDE 1 % OP SUSP
1.0000 [drp] | Freq: Three times a day (TID) | OPHTHALMIC | Status: DC
  Administered 2017-05-17 – 2017-05-18 (×5): 1 [drp] via OPHTHALMIC
  Filled 2017-05-16: qty 10

## 2017-05-16 MED ORDER — ALBUTEROL SULFATE (2.5 MG/3ML) 0.083% IN NEBU: 2.5000 mg | INHALATION_SOLUTION | Freq: Four times a day (QID) | RESPIRATORY_TRACT | Status: DC | PRN

## 2017-05-16 MED ORDER — METRONIDAZOLE IN NACL 5-0.79 MG/ML-% IV SOLN
500.0000 mg | Freq: Three times a day (TID) | INTRAVENOUS | Status: DC
  Administered 2017-05-16 – 2017-05-17 (×3): 500 mg via INTRAVENOUS
  Filled 2017-05-16 (×3): qty 100

## 2017-05-16 MED ORDER — ONDANSETRON HCL 4 MG/2ML IJ SOLN
4.0000 mg | Freq: Once | INTRAMUSCULAR | Status: AC
  Administered 2017-05-16: 4 mg via INTRAVENOUS
  Filled 2017-05-16: qty 2

## 2017-05-16 MED ORDER — THYROID 120 MG PO TABS
120.0000 mg | ORAL_TABLET | Freq: Every day | ORAL | Status: DC
Start: 2017-05-17 — End: 2017-05-18
  Administered 2017-05-17 – 2017-05-18 (×2): 120 mg via ORAL
  Filled 2017-05-16 (×2): qty 1

## 2017-05-16 MED ORDER — SODIUM CHLORIDE 0.9 % IV SOLN: 250.0000 mL | INTRAVENOUS | Status: DC | PRN

## 2017-05-16 MED ORDER — ONDANSETRON HCL 4 MG/2ML IJ SOLN
4.0000 mg | Freq: Four times a day (QID) | INTRAMUSCULAR | Status: DC | PRN
  Administered 2017-05-17: 4 mg via INTRAVENOUS
  Filled 2017-05-16: qty 2

## 2017-05-16 MED ORDER — SODIUM CHLORIDE 0.9% FLUSH
3.0000 mL | Freq: Two times a day (BID) | INTRAVENOUS | Status: DC
  Administered 2017-05-16 – 2017-05-18 (×3): 3 mL via INTRAVENOUS

## 2017-05-16 MED ORDER — DICYCLOMINE HCL 10 MG PO CAPS
10.0000 mg | ORAL_CAPSULE | Freq: Three times a day (TID) | ORAL | Status: DC
  Administered 2017-05-17 – 2017-05-18 (×5): 10 mg via ORAL
  Filled 2017-05-16 (×5): qty 1

## 2017-05-16 MED ORDER — SODIUM CHLORIDE 0.9 % IV SOLN
INTRAVENOUS | Status: DC
  Administered 2017-05-16: 21:00:00 via INTRAVENOUS

## 2017-05-16 MED ORDER — ONDANSETRON HCL 4 MG PO TABS
4.0000 mg | ORAL_TABLET | Freq: Four times a day (QID) | ORAL | Status: DC | PRN
  Administered 2017-05-17: 4 mg via ORAL
  Filled 2017-05-16: qty 1

## 2017-05-16 MED ORDER — METHYLPREDNISOLONE SODIUM SUCC 125 MG IJ SOLR
125.0000 mg | Freq: Once | INTRAMUSCULAR | Status: AC
  Administered 2017-05-16: 125 mg via INTRAVENOUS
  Filled 2017-05-16: qty 2

## 2017-05-16 MED ORDER — PREGABALIN 50 MG PO CAPS
50.0000 mg | ORAL_CAPSULE | Freq: Two times a day (BID) | ORAL | Status: DC
  Administered 2017-05-17 (×2): 50 mg via ORAL
  Filled 2017-05-16 (×2): qty 2

## 2017-05-16 MED ORDER — CIPROFLOXACIN IN D5W 400 MG/200ML IV SOLN
400.0000 mg | Freq: Two times a day (BID) | INTRAVENOUS | Status: DC
  Administered 2017-05-16 – 2017-05-18 (×4): 400 mg via INTRAVENOUS
  Filled 2017-05-16 (×4): qty 200

## 2017-05-16 NOTE — ED Notes (Signed)
Admitting provider at bedside.

## 2017-05-16 NOTE — ED Provider Notes (Signed)
[facility] EMERGENCY DEPARTMENT Provider Note   CSN: [id number] Arrival date & time: 05/16/17  1043     History   Chief Complaint Chief Complaint  Patient presents with  . Abdominal Pain  . Emesis  . Nausea    HPI Alexis [name] is a 51 y.o. female.  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  She reports that this happens intermittently, she has been seen and evaluated by multiple different gastroenterologist and is currently actively treated at [facility] by her gastroenterologist for her chronic colitis.  She reports that she has had colonoscopies, she has never been told that she had Crohn's disease, it has never been confirmed whether this was ulcerative colitis or colitis from other sources.  She has had some abdominal cramping which has been going on since 23 December however she has been feeling more and more dehydrated because of the massive amounts of diarrhea with it.  She started having bloody stools at 3:00 AM today and has had about 5 liquid bloody stools with abdominal cramping and multiple episodes of nausea and vomiting.  She has become lightheaded and weak and had some difficulty ambulating because of general weakness prehospital.  She denies fevers but has had some chills.  She denies dysuria, denies swelling of the legs, denies chest pain or shortness of breath and denies any headaches.  She does report that she took an aspirin last week because of a brief episode of chest pain.  She no longer has that.  She does not take other anticoagulants.  Past Medical History:  Diagnosis Date  . Arthritis   . Asthma   . Bilateral dry eyes   . Cervical spondylosis    C5-6  . Chronic colitis   . Chronic pain   .  Complication of anesthesia    has awakened in past/  RIGHT VOCAL CORD PARALYSIS  POST TOTAL THYROIDECTOMY  03-08-2009 ([facility])  . Degenerative disk disease   . Diabetic neuropathy, type II diabetes mellitus ([place]) 12/14/2014  . DJD (degenerative joint disease) of knee   . Dyspnea   . Endometriosis 01/18/01  . Fibromyalgia   . GERD (gastroesophageal reflux disease)   . GI bleed   . Glaucoma of both eyes   . H/O hiatal hernia   . History of colon polyps   . History of thyroid cancer    2010--  TOTAL THYROIDECTOMY--  NO RECURRENCE  . History of thyroiditis   . Hypertension   . Hypothyroidism, postsurgical   . IBS (irritable bowel syndrome)   . Interstitial cystitis   . Lymphedema   . OSA on CPAP   . Right knee meniscal tear   . Sickle cell trait ([place])   . Sickle cell trait ([place])   . Sjogren's syndrome ([place])   . Tremor 12/14/2014  . Uterine fibroid   . Varicose veins   . Vocal cord paralysis, unilateral complete    RIGHT--  POST TOTAL THYROIDECTOMY    Patient Active Problem List   Diagnosis Date Noted  . Colitis with rectal bleeding 05/16/2017  . Fibromyalgia 02/21/2016  . Tremor 12/14/2014  . Diabetic neuropathy, type II diabetes mellitus ([employer]) 12/14/2014  . Varicose veins of lower extremities with other complications  07/25/2012  . Leg swelling 07/25/2012  . SOB (shortness of breath) 02/22/2012  . Chronic pelvic pain in female 06/26/2000    Past Surgical History:  Procedure Laterality Date  . ACHILLES TENDON REPAIR Left 2007  . BREAST ENHANCEMENT SURGERY Bilateral 1997  . BUNIONECTOMY WITH HAMMERTOE RECONSTRUCTION AND GASTROC SLIDE Left FEB  2011   REMOVAL HARDWARE  NOV  2011  . CARDIAC CATHETERIZATION N/A 10/28/2015   Procedure: Right Heart Cath;  Surgeon: Adrian Prows, MD;  Location: [facility];  Service: Cardiovascular;  Laterality: N/A;  . COLONOSCOPY WITH ESOPHAGOGASTRODUODENOSCOPY (EGD)  MULTIPLE --  LAST ONE   NOV  2014  . CYSTO/  BILATERAL RETROGRADE  PYELOGRAM/  HYDRODISTENTION/  INSTILLATION THERAPY  05-08-2003  . CYSTO/  HYDRODISTENTION/  INSTILLATION THERAPY  06-04-2005  &  03-04-2007  . CYSTO/  URETHRAL DILATION /  BILATERAL UNROOFING URETEROCELE/  BILATERAL URETERAL STENT PLACEMENT  12-01-2002  . D & C HYSTEROSCOPY/  REMOVAL IUD  12-06-2003  . DILATATION AND CURETTAGE WITH SUCTION  05-29-2005   RETAINED PLACENTA  . DX LAPAROSCOPY/  PELVIC BX'S/  LYSIS ADHESIONS  06-26-2000  . HYDRADENITIS EXCISION  06/29/2011   Procedure: EXCISION HYDRADENITIS AXILLA;  Surgeon: Hermelinda Dellen, MD;  Location: [facility];  Service: Plastics;;  right breast hydradenitis excioion  . HYDRADENITIS EXCISION Left 2000  . KNEE ARTHROSCOPY WITH LATERAL MENISECTOMY Right 09/08/2013   Procedure: RIGHT KNEE ARTHROSCOPY PARTIAL LATERAL MENISCECTOMY AND DEBRIDEMENT ;  Surgeon: Johnn Hai, MD;  Location: [facility];  Service: Orthopedics;  Laterality: Right;  . LAPAROSCOPY ABDOMEN DIAGNOSTIC  SEPT  2003   [place]   AND PLACEMENT IUD  . LEFT WRIST TENDON RELEASE  MAY  2007  . PILONIDAL CYST EXCISION  1983  . REMOVAL PERIANAL ABSCESS  02-14-2001  . TEE WITHOUT CARDIOVERSION N/A 09/26/2015   Procedure: TRANSESOPHAGEAL ECHOCARDIOGRAM (TEE);  Surgeon: Adrian Prows, MD;  Location: [facility];  Service: Cardiovascular;  Laterality: N/A;  . TEMPORAL ARTERY BIOPSY / LIGATION  2010  . TEMPORAL ARTERY BIOPSY / LIGATION Right 06-16-2002  . TONSILLECTOMY  1990  . TOTAL THYROIDECTOMY  03-08-2009  . VEIN SURGERY    . WIDE EXCISION MASS,  CHEST AREA  10-29-2003    OB History    Gravida Para Term Preterm AB Living   1 1       1    SAB TAB Ectopic Multiple Live Births           1       Home Medications    Prior to Admission medications   Medication Sig Start Date End Date Taking? Authorizing Provider  albuterol (PROVENTIL HFA;VENTOLIN HFA) 108 (90 BASE) MCG/ACT inhaler Inhale 2 puffs into the lungs every 4 (four) hours as needed  for wheezing or shortness of breath.     [provider]  albuterol (PROVENTIL) (2.5 MG/3ML) 0.083% nebulizer solution Take 2.5 mg by nebulization every 6 (six) hours as needed for wheezing or shortness of breath.    [provider]  ARMOUR THYROID 120 MG tablet Take 120 mg by mouth daily. 09/25/16   [provider]  brinzolamide (AZOPT) 1 % ophthalmic suspension Place 1 drop into both eyes 3 (three) times daily. 06/03/15   [provider]  budesonide-formoterol (SYMBICORT) 160-4.5 MCG/ACT inhaler Inhale 1 puff into the lungs 2 (two) times daily.    [provider]  cetirizine (ZYRTEC) 10 MG tablet Take 10 mg by mouth daily.  [provider]  cyclobenzaprine (FLEXERIL) 10 MG tablet Take 10 mg by mouth 3 (three) times daily as needed for muscle spasms.     [provider]  cycloSPORINE (RESTASIS) 0.05 % ophthalmic emulsion Place 1 drop into both eyes 2 (two) times daily.    [provider]  D3-50 50000 units capsule Take 50,000 Units by mouth once a week. 08/26/16   [provider]  dicyclomine (BENTYL) 10 MG capsule TAKE 2 CAPSULE 4 TIMES DAILY 08/11/16   [provider]  diphenhydrAMINE-zinc acetate (BENADRYL) cream Apply 1 application topically as needed for itching.    [provider]  hydrOXYzine (ATARAX/VISTARIL) 50 MG tablet Take 50 mg by mouth 4 (four) times daily. 09/15/16   [provider]  lidocaine (XYLOCAINE) 2 % jelly Apply 1 application topically 2 (two) times daily as needed (Apply to affected area.).  08/26/15   [provider]  lidocaine (XYLOCAINE) 2 % solution Use as directed 5 mLs in the mouth or throat every 6 (six) hours as needed for mouth pain.  11/20/14   [provider]  LYRICA 75 MG capsule Take 75 mg by mouth at bedtime.  07/24/16   [provider]  MOVANTIK 25 MG TABS tablet TAKE 1 TABLET(S) BY MOUTH DAILY ON EMPTY STOMACH 1 HOUR BEFORE OR 2 HOURS  AFTER FIRST MEAL. 09/16/16   [provider]  nortriptyline (PAMELOR) 10 MG capsule Take one capsule at night for one week, then take 2 capsules at night for one week, then take 3 capsules at night Patient not taking: Reported on 10/20/2016 02/21/16   Kathrynn Ducking, MD  nystatin (MYCOSTATIN) 100000 UNIT/ML suspension Use as directed 5 mLs in the mouth or throat 4 (four) times daily as needed (For mouth sores.).     [provider]  omeprazole-sodium bicarbonate (ZEGERID) 40-1100 MG capsule Take 1 capsule by mouth daily. 10/11/16   [provider]  ondansetron (ZOFRAN ODT) 8 MG disintegrating tablet Take 1 tablet (8 mg total) by mouth every 8 (eight) hours as needed for nausea or vomiting. Patient not taking: Reported on 10/20/2016 01/21/16   Jola Schmidt, MD  ondansetron [facility]) 4 MG tablet Take 8 mg by mouth every 8 (eight) hours as needed for nausea or vomiting.  07/25/13   Antonietta Breach, PA-C  oxycodone (ROXICODONE) 30 MG immediate release tablet Take 30 mg by mouth every 4 (four) hours as needed for pain.    [provider]  predniSONE (DELTASONE) 20 MG tablet Take 20 mg by mouth every other day. 10/11/16   [provider]  pregabalin (LYRICA) 50 MG capsule Take 1 capsule (50 mg total) by mouth 2 (two) times daily. Patient not taking: Reported on 10/20/2016 07/03/15   Kathrynn Ducking, MD  promethazine (PHENERGAN) 25 MG tablet Take 25 mg by mouth every 6 (six) hours as needed for nausea or vomiting.    [provider]  silver sulfADIAZINE (SILVADENE) 1 % cream Apply 1 application topically as needed (Apply to affected area.).    [provider]  solifenacin (VESICARE) 10 MG tablet Take 10 mg by mouth at bedtime.  07/03/15   [provider]  XTAMPZA ER 27 MG C12A TAKE ONE CAPSULE EVERY 12 HOURS WITH FOOD 10/15/16   [provider]    Family History Family History  Problem Relation Age of Onset  . Atrial fibrillation Mother    . Diabetes Mother   . Thyroid disease Mother   . [place]  syndrome Mother   . Tremor Mother   . Atrial fibrillation Father   . Glaucoma Father   . Tremor Sister   . Cancer Paternal Aunt        breast, colon    Social History Social History   Tobacco Use  . Smoking status: Never Smoker  . Smokeless tobacco: Never Used  Substance Use Topics  . Alcohol use: Yes    Alcohol/week: 0.0 oz    Comment: very rarely  . Drug use: No     Allergies   Adhesive [tape]; Bactrim; Clarithromycin; Ibuprofen; Nexium [esomeprazole]; Nsaids; Other; Peg 3350-electrolytes; Sulfa antibiotics; Tolmetin; and Latex   Review of Systems Review of Systems  All other systems reviewed and are negative.    Physical Exam Updated Vital Signs BP 123/81   Pulse 93   Temp 98.2 F (36.8 C) (Oral)   Resp (!) 26   LMP 07/25/2014 Comment: negative urine pregnancy test 08/07/14  SpO2 99%   Physical Exam  Constitutional: She appears well-developed and well-nourished. No distress.  HENT:  Head: Normocephalic and atraumatic.  Mouth/Throat: Oropharynx is clear and moist. No oropharyngeal exudate.  Eyes: Conjunctivae and EOM are normal. Pupils are equal, round, and reactive to light. Right eye exhibits no discharge. Left eye exhibits no discharge. No scleral icterus.  Neck: Normal range of motion. Neck supple. No JVD present. No thyromegaly present.  Cardiovascular: Regular rhythm, normal heart sounds and intact distal pulses. Exam reveals no gallop and no friction rub.  No murmur heard. Tachycardic 115 bpm on my exam, normal pulses, no edema  Pulmonary/Chest: Effort normal and breath sounds normal. No respiratory distress. She has no wheezes. She has no rales.  Abdominal: Soft. Bowel sounds are normal. She exhibits no distension and no mass. There is tenderness ( Tenderness across the upper abdomen to the epigastrium and left upper quadrant, no tenderness to the lower abdomen or the right lower quadrant).    No guarding or peritoneal signs, very soft  Musculoskeletal: Normal range of motion. She exhibits no edema or tenderness.  Lymphadenopathy:    She has no cervical adenopathy.  Neurological: She is alert. Coordination normal.  Skin: Skin is warm and dry. No rash noted. No erythema.  Psychiatric: She has a normal mood and affect. Her behavior is normal.  Nursing note and vitals reviewed.    ED Treatments / Results  Labs (all labs ordered are listed, but only abnormal results are displayed) Labs Reviewed  COMPREHENSIVE METABOLIC PANEL - Abnormal; Notable for the following components:      Result Value   Potassium 3.1 (*)    Glucose, Bld 113 (*)    Total Protein 8.7 (*)    ALT 12 (*)    All other components within normal limits  CBC - Abnormal; Notable for the following components:   WBC 15.4 (*)    Platelets 477 (*)    All other components within normal limits  LIPASE, BLOOD  URINALYSIS, ROUTINE W REFLEX MICROSCOPIC  I-STAT BETA HCG BLOOD, ED ([facility], [facility], [facility] ONLY)     Radiology No results found.  Procedures Procedures (including critical care time)  Medications Ordered in ED Medications  ondansetron (ZOFRAN) injection 4 mg (not administered)  0.9 %  sodium chloride infusion (not administered)  methylPREDNISolone sodium succinate (SOLU-MEDROL) 125 mg/2 mL injection 125 mg (not administered)  sodium chloride 0.9 % bolus 1,000 mL (0 mLs Intravenous Stopped 05/16/17 1725)  ondansetron (ZOFRAN) injection 4 mg (4 mg Intravenous Given 05/16/17 1552)  morphine 4 MG/ML injection 4 mg (4 mg Intravenous Given 05/16/17 1554)  morphine 4 MG/ML injection 4 mg (4 mg Intravenous Given 05/16/17 1732)  sodium chloride 0.9 % bolus 1,000 mL (0 mLs Intravenous Stopped 05/16/17 1845)     Initial Impression / Assessment and Plan / ED Course  I have reviewed the triage vital signs and the nursing notes.  Pertinent labs & imaging results that were available during my care of the patient were  reviewed by me and considered in my medical decision making (see chart for details).     The patient has produced pictures of her last 5 bowel movements which characterize bright red liquid blood in the commode, there is no stool at all.  I do not feel that the patient needs a rectal exam to confirm that she is actually bleeding given this evidence.  Her hemoglobin is normal, she does have a leukocytosis and this is all suggestive of an active inflammatory process or infectious process such as colitis.  Due to her tachycardia likely from dehydration she will be given fluids, pain medications, antiemetics and I anticipate treatment for colitis.  The utility of a CT scan in the situation is low given her nonsurgical abdomen and history of chronic colitis, the patient is in agreement with that as well.  The patient has been given medications including pain medicine x2, Zofran x2, IV fluids, she has tolerated liquids but has ongoing pain and has had multiple episodes of bright red blood today including in the emergency department.  Because of her ongoing pain and inability to tolerate her meds for the last several days she has felt like she should probably stay in the hospital, I feel this is reasonable given her ongoing symptoms.  I discussed her care with Dr. Shanon Brow, I appreciate her expertise and willingness to admit the patient.  Final Clinical Impressions(s) / ED Diagnoses   Final diagnoses:  Colitis  Rectal bleeding  Nausea and vomiting, intractability of vomiting not specified, unspecified vomiting type    ED Discharge Orders    None       Noemi Chapel, MD 05/16/17 2053

## 2017-05-16 NOTE — H&P (Signed)
History and Physical    Alexis [name] YBO:[id number] DOB: [birth year] DOA: 05/16/2017  PCP: Alexis Mccreedy, MD  Patient coming from:  home  Chief Complaint:  Rectal bleeding, n/v, abdominal pain  HPI: Alexis [name] is a 51 y.o. female with medical history significant of fibromyalgia, IBS, chronic pain, colitis comes in with one week of worsening generalized abdominal pain and 3 days of bloody diarrhea with nausea and vomiting.  Chills but no fevers.  Pt reports chronic cholitis issues that have been going since 2013, has had 6 colonscopies with no definitive diagnosis of any chronic GI process.  She use to see GI here but changed to [facility] years ago.  No wt loss.  No blood in vomit.  Pt reports no vomiting since arrival to ED but very nauseas.  Pt referred for admission for vomiting and unable to keep anything down.  She does not report any recent hospitalizations.  Review of Systems: As per HPI otherwise 10 point review of systems negative.   Past Medical History:  Diagnosis Date  . Arthritis   . Asthma   . Bilateral dry eyes   . Cervical spondylosis    C5-6  . Chronic colitis   . Chronic pain   . Complication of anesthesia    has awakened in past/  RIGHT VOCAL CORD PARALYSIS  POST TOTAL THYROIDECTOMY  03-08-2009 ([facility])  . Degenerative disk disease   . Diabetic neuropathy, type II diabetes mellitus ([place]) 12/14/2014  . DJD (degenerative joint disease) of knee   . Dyspnea   . Endometriosis 01/18/01  . Fibromyalgia   . GERD (gastroesophageal reflux disease)   . GI bleed   . Glaucoma of both eyes   . H/O hiatal hernia   . History of colon polyps   . History of thyroid cancer    2010--  TOTAL THYROIDECTOMY--  NO RECURRENCE  . History of thyroiditis   . Hypertension   . Hypothyroidism, postsurgical   . IBS (irritable bowel syndrome)   . Interstitial cystitis   . Lymphedema   . OSA on CPAP   . Right knee meniscal tear   . Sickle cell trait ([place])   . Sickle cell  trait ([place])   . Sjogren's syndrome ([place])   . Tremor 12/14/2014  . Uterine fibroid   . Varicose veins   . Vocal cord paralysis, unilateral complete    RIGHT--  POST TOTAL THYROIDECTOMY    Past Surgical History:  Procedure Laterality Date  . ACHILLES TENDON REPAIR Left 2007  . BREAST ENHANCEMENT SURGERY Bilateral 1997  . BUNIONECTOMY WITH HAMMERTOE RECONSTRUCTION AND GASTROC SLIDE Left FEB  2011   REMOVAL HARDWARE  NOV  2011  . CARDIAC CATHETERIZATION N/A 10/28/2015   Procedure: Right Heart Cath;  Surgeon: Adrian Prows, MD;  Location: [place] [facility];  Service: Cardiovascular;  Laterality: N/A;  . COLONOSCOPY WITH ESOPHAGOGASTRODUODENOSCOPY (EGD)  MULTIPLE --  LAST ONE   NOV  2014  . CYSTO/  BILATERAL RETROGRADE PYELOGRAM/  HYDRODISTENTION/  INSTILLATION THERAPY  05-08-2003  . CYSTO/  HYDRODISTENTION/  INSTILLATION THERAPY  06-04-2005  &  03-04-2007  . CYSTO/  URETHRAL DILATION /  BILATERAL UNROOFING URETEROCELE/  BILATERAL URETERAL STENT PLACEMENT  12-01-2002  . D & C HYSTEROSCOPY/  REMOVAL IUD  12-06-2003  . DILATATION AND CURETTAGE WITH SUCTION  05-29-2005   RETAINED PLACENTA  . DX LAPAROSCOPY/  PELVIC BX'S/  LYSIS ADHESIONS  06-26-2000  . HYDRADENITIS EXCISION  06/29/2011  Procedure: EXCISION HYDRADENITIS AXILLA;  Surgeon: Hermelinda Dellen, MD;  Location: [place];  Service: Plastics;;  right breast hydradenitis excioion  . HYDRADENITIS EXCISION Left 2000  . KNEE ARTHROSCOPY WITH LATERAL MENISECTOMY Right 09/08/2013   Procedure: RIGHT KNEE ARTHROSCOPY PARTIAL LATERAL MENISCECTOMY AND DEBRIDEMENT ;  Surgeon: Johnn Hai, MD;  Location: [place];  Service: Orthopedics;  Laterality: Right;  . LAPAROSCOPY ABDOMEN DIAGNOSTIC  SEPT  2003   [place]   AND PLACEMENT IUD  . LEFT WRIST TENDON RELEASE  MAY  2007  . PILONIDAL CYST EXCISION  1983  . REMOVAL PERIANAL ABSCESS  02-14-2001  . TEE WITHOUT CARDIOVERSION N/A 09/26/2015   Procedure:  TRANSESOPHAGEAL ECHOCARDIOGRAM (TEE);  Surgeon: Adrian Prows, MD;  Location: [place];  Service: Cardiovascular;  Laterality: N/A;  . TEMPORAL ARTERY BIOPSY / LIGATION  2010  . TEMPORAL ARTERY BIOPSY / LIGATION Right 06-16-2002  . TONSILLECTOMY  1990  . TOTAL THYROIDECTOMY  03-08-2009  . VEIN SURGERY    . WIDE EXCISION MASS,  CHEST AREA  10-29-2003     reports that  has never smoked. she has never used smokeless tobacco. She reports that she drinks alcohol. She reports that she does not use drugs.  Allergies  Allergen Reactions  . Adhesive [Tape] Other (See Comments)    SKIN IRRITATION  . Bactrim Nausea And Vomiting  . Clarithromycin Nausea And Vomiting  . Ibuprofen Nausea And Vomiting and Other (See Comments)    SEVERE STOMACH PAIN - CAN TAKE IV WITH NO ISSUES  . Nexium [Esomeprazole] Other (See Comments)    Cramps and headaches  . Nsaids Other (See Comments)    AVOIDS DUE TO GERD  . Other Nausea And Vomiting and Other (See Comments)    Pulp in all fruits - can only eat if cooked  . Peg 3350-Electrolytes Other (See Comments)    Aspiration   . Sulfa Antibiotics Nausea And Vomiting and Other (See Comments)    ORAL MED.   (PER  , IV,  NO ISSUES)  . Tolmetin Other (See Comments)    AVOIDS DUE TO GERD  . Latex Rash and Other (See Comments)    SEVERE ITCHING    Family History  Problem Relation Age of Onset  . Atrial fibrillation Mother   . Diabetes Mother   . Thyroid disease Mother   . Cushing syndrome Mother   . Tremor Mother   . Atrial fibrillation Father   . Glaucoma Father   . Tremor Sister   . Cancer Paternal Aunt        breast, colon    Prior to Admission medications   Medication Sig Start Date End Date Taking? Authorizing Provider  albuterol (PROVENTIL HFA;VENTOLIN HFA) 108 (90 BASE) MCG/ACT inhaler Inhale 2 puffs into the lungs every 4 (four) hours as needed for wheezing or shortness of breath.     [provider]  albuterol (PROVENTIL) (2.5 MG/3ML)  0.083% nebulizer solution Take 2.5 mg by nebulization every 6 (six) hours as needed for wheezing or shortness of breath.    [provider]  ARMOUR THYROID 120 MG tablet Take 120 mg by mouth daily. 09/25/16   [provider]  brinzolamide (AZOPT) 1 % ophthalmic suspension Place 1 drop into both eyes 3 (three) times daily. 06/03/15   [provider]  budesonide-formoterol (SYMBICORT) 160-4.5 MCG/ACT inhaler Inhale 1 puff into the lungs 2 (two) times daily.    [provider]  cetirizine (ZYRTEC) 10  MG tablet Take 10 mg by mouth daily.     [provider]  cyclobenzaprine (FLEXERIL) 10 MG tablet Take 10 mg by mouth 3 (three) times daily as needed for muscle spasms.     [provider]  cycloSPORINE (RESTASIS) 0.05 % ophthalmic emulsion Place 1 drop into both eyes 2 (two) times daily.    [provider]  D3-50 50000 units capsule Take 50,000 Units by mouth once a week. 08/26/16   [provider]  dicyclomine (BENTYL) 10 MG capsule TAKE 2 CAPSULE 4 TIMES DAILY 08/11/16   [provider]  diphenhydrAMINE-zinc acetate (BENADRYL) cream Apply 1 application topically as needed for itching.    [provider]  hydrOXYzine (ATARAX/VISTARIL) 50 MG tablet Take 50 mg by mouth 4 (four) times daily. 09/15/16   [provider]  lidocaine (XYLOCAINE) 2 % jelly Apply 1 application topically 2 (two) times daily as needed (Apply to affected area.).  08/26/15   [provider]  lidocaine (XYLOCAINE) 2 % solution Use as directed 5 mLs in the mouth or throat every 6 (six) hours as needed for mouth pain.  11/20/14   [provider]  LYRICA 75 MG capsule Take 75 mg by mouth at bedtime.  07/24/16   [provider]  MOVANTIK 25 MG TABS tablet TAKE 1 TABLET(S) BY MOUTH DAILY ON EMPTY STOMACH 1 HOUR BEFORE OR 2 HOURS AFTER FIRST MEAL. 09/16/16   [provider]  nortriptyline (PAMELOR) 10 MG capsule Take one  capsule at night for one week, then take 2 capsules at night for one week, then take 3 capsules at night Patient not taking: Reported on 10/20/2016 02/21/16   Kathrynn Ducking, MD  nystatin (MYCOSTATIN) 100000 UNIT/ML suspension Use as directed 5 mLs in the mouth or throat 4 (four) times daily as needed (For mouth sores.).     [provider]  omeprazole-sodium bicarbonate (ZEGERID) 40-1100 MG capsule Take 1 capsule by mouth daily. 10/11/16   [provider]  ondansetron (ZOFRAN ODT) 8 MG disintegrating tablet Take 1 tablet (8 mg total) by mouth every 8 (eight) hours as needed for nausea or vomiting. Patient not taking: Reported on 10/20/2016 01/21/16   Jola Schmidt, MD  ondansetron [facility]) 4 MG tablet Take 8 mg by mouth every 8 (eight) hours as needed for nausea or vomiting.  07/25/13   Antonietta Breach, PA-C  oxycodone (ROXICODONE) 30 MG immediate release tablet Take 30 mg by mouth every 4 (four) hours as needed for pain.    [provider]  predniSONE (DELTASONE) 20 MG tablet Take 20 mg by mouth every other day. 10/11/16   [provider]  pregabalin (LYRICA) 50 MG capsule Take 1 capsule (50 mg total) by mouth 2 (two) times daily. Patient not taking: Reported on 10/20/2016 07/03/15   Kathrynn Ducking, MD  promethazine (PHENERGAN) 25 MG tablet Take 25 mg by mouth every 6 (six) hours as needed for nausea or vomiting.    [provider]  silver sulfADIAZINE (SILVADENE) 1 % cream Apply 1 application topically as needed (Apply to affected area.).    [provider]  solifenacin (VESICARE) 10 MG tablet Take 10 mg by mouth at bedtime.  07/03/15   [provider]  XTAMPZA ER 27 MG C12A TAKE ONE CAPSULE EVERY 12 HOURS WITH FOOD 10/15/16   [provider]    Physical Exam: Vitals:   05/16/17 1945 05/16/17 2000 05/16/17 2030 05/16/17 2045  BP: 131/80 133/88 123/81 118/77  Pulse: 95  93 99  Resp: 11 (!) 26    Temp:      TempSrc:      SpO2: 99%   99% 99%      Constitutional: NAD, calm, comfortable Vitals:   05/16/17 1945 05/16/17 2000 05/16/17 2030 05/16/17 2045  BP: 131/80 133/88 123/81 118/77  Pulse: 95  93 99  Resp: 11 (!) 26    Temp:      TempSrc:      SpO2: 99%  99% 99%   Eyes: PERRL, lids and conjunctivae normal ENMT: Mucous membranes are moist. Posterior pharynx clear of any exudate or lesions.Normal dentition.  Neck: normal, supple, no masses, no thyromegaly Respiratory: clear to auscultation bilaterally, no wheezing, no crackles. Normal respiratory effort. No accessory muscle use.  Cardiovascular: Regular rate and rhythm, no murmurs / rubs / gallops. No extremity edema. 2+ pedal pulses. No carotid bruits.  Abdomen: no tenderness, no masses palpated. No hepatosplenomegaly. Bowel sounds positive.  Musculoskeletal: no clubbing / cyanosis. No joint deformity upper and lower extremities. Good ROM, no contractures. Normal muscle tone.  Skin: no rashes, lesions, ulcers. No induration Neurologic: CN 2-12 grossly intact. Sensation intact, DTR normal. Strength 5/5 in all 4.  Psychiatric: Normal judgment and insight. Alert and oriented x 3. Normal mood.    Labs on Admission: I have personally reviewed following labs and imaging studies  CBC: Recent Labs  Lab 05/16/17 1116  WBC 15.4*  HGB 14.7  HCT 43.5  MCV 86.0  PLT 151*   Basic Metabolic Panel: Recent Labs  Lab 05/16/17 1116  NA 141  K 3.1*  CL 106  CO2 24  GLUCOSE 113*  BUN 7  CREATININE 0.76  CALCIUM 10.1   GFR: CrCl cannot be calculated (Unknown ideal weight.). Liver Function Tests: Recent Labs  Lab 05/16/17 1116  AST 16  ALT 12*  ALKPHOS 108  BILITOT 0.8  PROT 8.7*  ALBUMIN 4.4   Recent Labs  Lab 05/16/17 1116  LIPASE 26   No results for input(s): AMMONIA in the last 168 hours. Coagulation Profile: No results for input(s): INR, PROTIME in the last 168 hours. Cardiac Enzymes: No results for input(s): CKTOTAL, CKMB, CKMBINDEX,  TROPONINI in the last 168 hours. BNP (last 3 results) No results for input(s): PROBNP in the last 8760 hours. HbA1C: No results for input(s): HGBA1C in the last 72 hours. CBG: No results for input(s): GLUCAP in the last 168 hours. Lipid Profile: No results for input(s): CHOL, HDL, LDLCALC, TRIG, CHOLHDL, LDLDIRECT in the last 72 hours. Thyroid Function Tests: No results for input(s): TSH, T4TOTAL, FREET4, T3FREE, THYROIDAB in the last 72 hours. Anemia Panel: No results for input(s): VITAMINB12, FOLATE, FERRITIN, TIBC, IRON, RETICCTPCT in the last 72 hours. Urine analysis:    Component Value Date/Time   COLORURINE YELLOW 05/16/2017 2020   APPEARANCEUR HAZY (A) 05/16/2017 2020   LABSPEC 1.014 05/16/2017 2020   PHURINE 6.0 05/16/2017 2020   GLUCOSEU 50 (A) 05/16/2017 2020   HGBUR MODERATE (A) 05/16/2017 2020   BILIRUBINUR NEGATIVE 05/16/2017 2020   KETONESUR 20 (A) 05/16/2017 2020   PROTEINUR 30 (A) 05/16/2017 2020   UROBILINOGEN 0.2 08/07/2014 2335   NITRITE NEGATIVE 05/16/2017 2020   LEUKOCYTESUR NEGATIVE 05/16/2017 2020   Sepsis Labs: !!!!!!!!!!!!!!!!!!!!!!!!!!!!!!!!!!!!!!!!!!!! @LABRCNTIP (procalcitonin:4,lacticidven:4) )No results found for this or any previous visit (from the past 240 hour(s)).   Radiological Exams on Admission: No results found.  Old chart reviewed Case discussed with edp   Assessment/Plan 51 yo female with  colitis : rectal bleeding, n/v, abdominal pain with multiple previous cscopes that are reportedly normal  Principal Problem:   Colitis with rectal bleeding- abd exam benign.  Wbc 15.  Ck ct abd/pelvis.  Treat with iv cipro and flagyl.  Full liq diet. Given a dose of solumedrol in ED, will not continue at this time.  Ck gi panel.  Monitor closely for significant bleeding (hgb nml)  Active Problems:   Chronic pelvic pain in female- noted   Diabetic neuropathy, type II diabetes mellitus ([place])- noted   Fibromyalgia- noted   Nausea and vomiting-  noted   Home med list pending pharm review   DVT prophylaxis:  scds Code Status:  full Family Communication:  none  Disposition Plan:  Per day team Consults called:  none Admission status:  observation   Corene Resnick A MD [facility]  If 7PM-7AM, please contact night-coverage [email or web address] Password [email or web address]  05/16/2017, 9:12 PM

## 2017-05-16 NOTE — ED Notes (Signed)
Orthostatic vitals done, pt states she feels weak and a "tingling sensation in extremities" once she stands up. The feeling goes away once she lays back down in the bed.

## 2017-05-16 NOTE — ED Triage Notes (Signed)
Pt. Stated, Im having a flare up abdominal pain and cramping from colitis with N/V last Sunday night.

## 2017-05-17 ENCOUNTER — Encounter (HOSPITAL_COMMUNITY): Payer: Self-pay | Admitting: General Practice

## 2017-05-17 ENCOUNTER — Other Ambulatory Visit: Payer: Self-pay

## 2017-05-17 ENCOUNTER — Observation Stay (HOSPITAL_COMMUNITY): Payer: Medicaid Other

## 2017-05-17 DIAGNOSIS — K219 Gastro-esophageal reflux disease without esophagitis: Secondary | ICD-10-CM | POA: Diagnosis present

## 2017-05-17 DIAGNOSIS — J3801 Paralysis of vocal cords and larynx, unilateral: Secondary | ICD-10-CM | POA: Diagnosis present

## 2017-05-17 DIAGNOSIS — E1140 Type 2 diabetes mellitus with diabetic neuropathy, unspecified: Secondary | ICD-10-CM

## 2017-05-17 DIAGNOSIS — E860 Dehydration: Secondary | ICD-10-CM | POA: Diagnosis present

## 2017-05-17 DIAGNOSIS — Z23 Encounter for immunization: Secondary | ICD-10-CM | POA: Diagnosis not present

## 2017-05-17 DIAGNOSIS — G4733 Obstructive sleep apnea (adult) (pediatric): Secondary | ICD-10-CM | POA: Diagnosis present

## 2017-05-17 DIAGNOSIS — K921 Melena: Secondary | ICD-10-CM | POA: Diagnosis not present

## 2017-05-17 DIAGNOSIS — D573 Sickle-cell trait: Secondary | ICD-10-CM | POA: Diagnosis present

## 2017-05-17 DIAGNOSIS — Z7952 Long term (current) use of systemic steroids: Secondary | ICD-10-CM | POA: Diagnosis not present

## 2017-05-17 DIAGNOSIS — K648 Other hemorrhoids: Secondary | ICD-10-CM | POA: Diagnosis present

## 2017-05-17 DIAGNOSIS — R102 Pelvic and perineal pain: Secondary | ICD-10-CM | POA: Diagnosis present

## 2017-05-17 DIAGNOSIS — B9622 Other specified Shiga toxin-producing Escherichia coli [E. coli] (STEC) as the cause of diseases classified elsewhere: Secondary | ICD-10-CM | POA: Diagnosis present

## 2017-05-17 DIAGNOSIS — G8929 Other chronic pain: Secondary | ICD-10-CM | POA: Diagnosis present

## 2017-05-17 DIAGNOSIS — Z79899 Other long term (current) drug therapy: Secondary | ICD-10-CM | POA: Diagnosis not present

## 2017-05-17 DIAGNOSIS — E890 Postprocedural hypothyroidism: Secondary | ICD-10-CM | POA: Diagnosis present

## 2017-05-17 DIAGNOSIS — Z79890 Hormone replacement therapy (postmenopausal): Secondary | ICD-10-CM | POA: Diagnosis not present

## 2017-05-17 DIAGNOSIS — H409 Unspecified glaucoma: Secondary | ICD-10-CM | POA: Diagnosis present

## 2017-05-17 DIAGNOSIS — Z79891 Long term (current) use of opiate analgesic: Secondary | ICD-10-CM | POA: Diagnosis not present

## 2017-05-17 DIAGNOSIS — Z7951 Long term (current) use of inhaled steroids: Secondary | ICD-10-CM | POA: Diagnosis not present

## 2017-05-17 DIAGNOSIS — I10 Essential (primary) hypertension: Secondary | ICD-10-CM | POA: Diagnosis present

## 2017-05-17 DIAGNOSIS — M3500 Sicca syndrome, unspecified: Secondary | ICD-10-CM | POA: Diagnosis present

## 2017-05-17 DIAGNOSIS — K529 Noninfective gastroenteritis and colitis, unspecified: Secondary | ICD-10-CM | POA: Diagnosis present

## 2017-05-17 DIAGNOSIS — M797 Fibromyalgia: Secondary | ICD-10-CM | POA: Diagnosis present

## 2017-05-17 DIAGNOSIS — Z86010 Personal history of colonic polyps: Secondary | ICD-10-CM | POA: Diagnosis not present

## 2017-05-17 DIAGNOSIS — A498 Other bacterial infections of unspecified site: Secondary | ICD-10-CM | POA: Diagnosis not present

## 2017-05-17 DIAGNOSIS — Z85850 Personal history of malignant neoplasm of thyroid: Secondary | ICD-10-CM | POA: Diagnosis not present

## 2017-05-17 LAB — GASTROINTESTINAL PANEL BY PCR, STOOL (REPLACES STOOL CULTURE)

## 2017-05-17 LAB — BASIC METABOLIC PANEL
Anion gap: 10 (ref 5–15)
BUN: 8 mg/dL (ref 6–20)
CO2: 19 mmol/L — ABNORMAL LOW (ref 22–32)
Calcium: 9.2 mg/dL (ref 8.9–10.3)
Chloride: 111 mmol/L (ref 101–111)
Creatinine, Ser: 0.94 mg/dL (ref 0.44–1.00)
GFR calc Af Amer: >60 mL/min (ref >60)
GFR calc non Af Amer: >60 mL/min (ref >60)
Glucose, Bld: 174 mg/dL — ABNORMAL HIGH (ref 65–99)
Potassium: 3.9 mmol/L (ref 3.5–5.1)
Sodium: 140 mmol/L (ref 135–145)

## 2017-05-17 LAB — CBC
HCT: 36 % (ref 36.0–46.0)
Hemoglobin: 12.2 g/dL (ref 12.0–15.0)
MCH: 29.2 pg (ref 26.0–34.0)
MCHC: 33.9 g/dL (ref 30.0–36.0)
MCV: 86.1 fL (ref 78.0–100.0)
Platelets: 405 10*3/uL — ABNORMAL HIGH (ref 150–400)
RBC: 4.18 MIL/uL (ref 3.87–5.11)
RDW: 14 % (ref 11.5–15.5)
WBC: 13.8 10*3/uL — ABNORMAL HIGH (ref 4.0–10.5)

## 2017-05-17 LAB — C DIFFICILE QUICK SCREEN W PCR REFLEX
C Diff antigen: NEGATIVE
C Diff interpretation: NOT DETECTED
C Diff toxin: NEGATIVE

## 2017-05-17 MED ORDER — SODIUM CHLORIDE 0.9 % IV SOLN
8.0000 mg | Freq: Four times a day (QID) | INTRAVENOUS | Status: DC | PRN
  Filled 2017-05-17: qty 4

## 2017-05-17 MED ORDER — SODIUM CHLORIDE 0.9 % IV SOLN
INTRAVENOUS | Status: AC
  Administered 2017-05-17: 11:00:00 via INTRAVENOUS

## 2017-05-17 MED ORDER — ONDANSETRON HCL 4 MG PO TABS
8.0000 mg | ORAL_TABLET | Freq: Four times a day (QID) | ORAL | Status: DC | PRN
  Administered 2017-05-18: 8 mg via ORAL
  Filled 2017-05-17: qty 2

## 2017-05-17 MED ORDER — IOPAMIDOL (ISOVUE-300) INJECTION 61%
INTRAVENOUS | Status: AC
  Administered 2017-05-17: 100 mL
  Filled 2017-05-17: qty 100

## 2017-05-17 MED ORDER — PROMETHAZINE HCL 25 MG/ML IJ SOLN
12.5000 mg | Freq: Once | INTRAMUSCULAR | Status: AC
  Administered 2017-05-17: 12.5 mg via INTRAVENOUS
  Filled 2017-05-17: qty 1

## 2017-05-17 MED ORDER — INFLUENZA VAC SPLIT QUAD 0.5 ML IM SUSY
0.5000 mL | PREFILLED_SYRINGE | INTRAMUSCULAR | Status: AC
  Administered 2017-05-18: 0.5 mL via INTRAMUSCULAR
  Filled 2017-05-17: qty 0.5

## 2017-05-17 NOTE — ED Notes (Signed)
Handoff report given to Kelly RN

## 2017-05-17 NOTE — Progress Notes (Signed)
PROGRESS NOTE    Alexis [name]  DQQ:[id number] DOB: 08/22/1965 DOA: 05/16/2017 PCP: Benito Mccreedy, MD   Outpatient Specialists:     Brief Narrative:  Alexis [name] is a 51 y.o. female with medical history significant of fibromyalgia, IBS, chronic pain, colitis comes in with one week of worsening generalized abdominal pain and 3 days of bloody diarrhea with nausea and vomiting.  Chills but no fevers.  Pt reports chronic cholitis issues that have been going since 2013, has had 6 colonscopies with no definitive diagnosis of any chronic GI process.  She use to see GI here but changed to [facility] years ago.  No wt loss.  No blood in vomit.  Pt reports no vomiting since arrival to ED but very nauseas.  Pt referred for admission for vomiting and unable to keep anything down.  She does not report any recent hospitalizations.    Assessment & Plan:   Principal Problem:   Colitis with rectal bleeding Active Problems:   Chronic pelvic pain in female   Diabetic neuropathy, type II diabetes mellitus ([facility])   Fibromyalgia   Nausea and vomiting   Diarrhea with rectal bleeding- suspect due to known internal hemorrhoids  -ct abd/pelvis: Unremarkable contrast-enhanced CT of the abdomen and pelvis   -stool studies pending -does not appear to be colitis but has been started on iv cipro and flagyl -continue IVF    Chronic pelvic pain in female- noted    Diabetic neuropathy, type II diabetes mellitus ([place])- noted    Fibromyalgia- noted    Nausea and vomiting- resolved     DVT prophylaxis:  SCD's  Code Status: Full Code   Family Communication:   Disposition Plan:  Home in AM if Hgb stable and patient able to eat   Consultants:      Subjective: C/o cramping in stomach  Objective: Vitals:   05/17/17 0600 05/17/17 0800 05/17/17 0835 05/17/17 0836  BP: 125/63   127/85  Pulse: 94 99 93   Resp: 13 19 18    Temp:      TempSrc:      SpO2: 97% 97% 97%      Intake/Output Summary (Last 24 hours) at 05/17/2017 1301 Last data filed at 05/17/2017 0755 Gross per 24 hour  Intake 3400 ml  Output -  Net 3400 ml   There were no vitals filed for this visit.  Examination:  General exam: Appears calm and comfortable  Respiratory system: Clear to auscultation. Respiratory effort normal. Cardiovascular system: S1 & S2 heard, RRR. No JVD, murmurs, rubs, gallops or clicks. No pedal edema. Gastrointestinal system: Abdomen is nondistended, soft and nontender. No organomegaly or masses felt. Normal bowel sounds heard. Central nervous system: Alert and oriented. No focal neurological deficits. Extremities: Symmetric 5 x 5 power. Skin: No rashes, lesions or ulcers Psychiatry: Judgement and insight appear normal. Mood & affect appropriate.     Data Reviewed: I have personally reviewed following labs and imaging studies  CBC: Recent Labs  Lab 05/16/17 1116 05/17/17 0402  WBC 15.4* 13.8*  HGB 14.7 12.2  HCT 43.5 36.0  MCV 86.0 86.1  PLT 477* 194*   Basic Metabolic Panel: Recent Labs  Lab 05/16/17 1116 05/17/17 0402  NA 141 140  K 3.1* 3.9  CL 106 111  CO2 24 19*  GLUCOSE 113* 174*  BUN 7 8  CREATININE 0.76 0.94  CALCIUM 10.1 9.2   GFR: CrCl cannot be calculated (Unknown ideal weight.). Liver Function Tests: Recent Labs  Lab 05/16/17 1116  AST 16  ALT 12*  ALKPHOS 108  BILITOT 0.8  PROT 8.7*  ALBUMIN 4.4   Recent Labs  Lab 05/16/17 1116  LIPASE 26   No results for input(s): AMMONIA in the last 168 hours. Coagulation Profile: No results for input(s): INR, PROTIME in the last 168 hours. Cardiac Enzymes: No results for input(s): CKTOTAL, CKMB, CKMBINDEX, TROPONINI in the last 168 hours. BNP (last 3 results) No results for input(s): PROBNP in the last 8760 hours. HbA1C: No results for input(s): HGBA1C in the last 72 hours. CBG: No results for input(s): GLUCAP in the last 168 hours. Lipid Profile: No results for  input(s): CHOL, HDL, LDLCALC, TRIG, CHOLHDL, LDLDIRECT in the last 72 hours. Thyroid Function Tests: No results for input(s): TSH, T4TOTAL, FREET4, T3FREE, THYROIDAB in the last 72 hours. Anemia Panel: No results for input(s): VITAMINB12, FOLATE, FERRITIN, TIBC, IRON, RETICCTPCT in the last 72 hours. Urine analysis:    Component Value Date/Time   COLORURINE YELLOW 05/16/2017 2020   APPEARANCEUR HAZY (A) 05/16/2017 2020   LABSPEC 1.014 05/16/2017 2020   PHURINE 6.0 05/16/2017 2020   GLUCOSEU 50 (A) 05/16/2017 2020   HGBUR MODERATE (A) 05/16/2017 2020   BILIRUBINUR NEGATIVE 05/16/2017 2020   KETONESUR 20 (A) 05/16/2017 2020   PROTEINUR 30 (A) 05/16/2017 2020   UROBILINOGEN 0.2 08/07/2014 2335   NITRITE NEGATIVE 05/16/2017 2020   LEUKOCYTESUR NEGATIVE 05/16/2017 2020     )No results found for this or any previous visit (from the past 240 hour(s)).    Anti-infectives (From admission, onward)   Start     Dose/Rate Route Frequency Ordered Stop   05/16/17 2230  ciprofloxacin (CIPRO) IVPB 400 mg     400 mg 200 mL/hr over 60 Minutes Intravenous Every 12 hours 05/16/17 2227     05/16/17 2230  metroNIDAZOLE (FLAGYL) IVPB 500 mg     500 mg 100 mL/hr over 60 Minutes Intravenous Every 8 hours 05/16/17 2227         Radiology Studies: Ct Abdomen Pelvis W Contrast  Result Date: 05/17/2017 CLINICAL DATA:  Acute onset of generalized abdominal pain and cramping. Nausea and vomiting. EXAM: CT ABDOMEN AND PELVIS WITH CONTRAST TECHNIQUE: Multidetector CT imaging of the abdomen and pelvis was performed using the standard protocol following bolus administration of intravenous contrast. CONTRAST:  157mL ISOVUE-300 IOPAMIDOL (ISOVUE-300) INJECTION 61% COMPARISON:  CT of the abdomen and pelvis performed 01/21/2016 FINDINGS: Lower chest: The visualized lung bases are grossly clear. The visualized portions of the mediastinum are unremarkable. Hepatobiliary: The liver is unremarkable in appearance. The  gallbladder is unremarkable in appearance. The common bile duct remains normal in caliber. Pancreas: The pancreas is within normal limits. Spleen: The spleen is unremarkable in appearance. Adrenals/Urinary Tract: The adrenal glands are unremarkable in appearance. The kidneys are within normal limits. There is no evidence of hydronephrosis. No renal or ureteral stones are identified. No perinephric stranding is seen. Stomach/Bowel: The stomach is unremarkable in appearance. The small bowel is within normal limits. The appendix is normal in caliber, without evidence of appendicitis. The colon is unremarkable in appearance. Vascular/Lymphatic: The abdominal aorta is unremarkable in appearance. The inferior vena cava is grossly unremarkable. No retroperitoneal lymphadenopathy is seen. No pelvic sidewall lymphadenopathy is identified. Reproductive: The bladder is mildly distended and grossly unremarkable. The uterus is unremarkable in appearance. The ovaries are relatively symmetric. No suspicious adnexal masses are seen. Other: No additional soft tissue abnormalities are seen. Musculoskeletal: No acute osseous abnormalities are  identified. Vacuum phenomenon is noted at L5-S1. The visualized musculature is unremarkable in appearance. IMPRESSION: Unremarkable contrast-enhanced CT of the abdomen and pelvis. Electronically Signed   By: Garald Balding M.D.   On: 05/17/2017 01:05        Scheduled Meds: . brinzolamide  1 drop Both Eyes TID  . cycloSPORINE  1 drop Both Eyes BID  . dicyclomine  10 mg Oral TID AC  . pregabalin  50 mg Oral BID  . pregabalin  75 mg Oral QHS  . sodium chloride flush  3 mL Intravenous Q12H  . thyroid  120 mg Oral Daily  . [START ON 05/21/2017] Vitamin D (Ergocalciferol)  50,000 Units Oral Q Fri   Continuous Infusions: . sodium chloride    . sodium chloride 75 mL/hr at 05/17/17 1041  . ciprofloxacin 400 mg (05/17/17 1045)  . metronidazole Stopped (05/17/17 0755)     LOS: 0 days      Time spent: 35 min    Geradine Girt, DO [facility] Pager [phone]  If 7PM-7AM, please contact night-coverage [email or web address] Password [email or web address] 05/17/2017, 1:01 PM

## 2017-05-17 NOTE — ED Notes (Signed)
[facility] called with Stool Specimen Results , + Enteroaggregative E. Coli.  Reported the results to Primary RN Ophelia Charter and also sent message to Admitting MD, Eulogio Bear, 05/07/2017, 15:18 Lsb, RN

## 2017-05-17 NOTE — ED Notes (Signed)
Admitting MD at bedside. Visitor almost here to pick up her son who is at bedside.

## 2017-05-18 DIAGNOSIS — A498 Other bacterial infections of unspecified site: Secondary | ICD-10-CM

## 2017-05-18 MED ORDER — FAMOTIDINE 20 MG PO TABS
20.0000 mg | ORAL_TABLET | Freq: Two times a day (BID) | ORAL | Status: DC
Start: 2017-05-18 — End: 2017-05-18

## 2017-05-18 MED ORDER — OXYCODONE HCL 30 MG PO TABA: 30.0000 mg | ORAL_TABLET | Freq: Four times a day (QID) | ORAL | 0 refills | Status: DC | PRN

## 2017-05-18 MED ORDER — CIPROFLOXACIN HCL 500 MG PO TABS: 500.0000 mg | ORAL_TABLET | Freq: Two times a day (BID) | ORAL | 0 refills | Status: DC

## 2017-05-18 MED ORDER — FLUCONAZOLE 150 MG PO TABS: 150.0000 mg | ORAL_TABLET | Freq: Every day | ORAL | 0 refills | Status: AC

## 2017-05-18 NOTE — Discharge Summary (Signed)
Physician Discharge Summary  Alexis [name] UUV:[id number] DOB: [birth year] DOA: 05/16/2017  PCP: Benito Mccreedy, MD  Admit date: 05/16/2017 Discharge date: 05/18/2017   Recommendations for Outpatient Follow-Up:   Outpatient follow up with pain  clinic  Discharge Diagnosis:   Principal Problem:   Colitis with rectal bleeding Active Problems:   Chronic pelvic pain in female   Diabetic neuropathy, type II diabetes mellitus (HCC)   Fibromyalgia   Nausea and vomiting   Discharge disposition:  Home.  Discharge Condition: Improved.  Diet recommendation: regular  Wound care: None.   History of Present Illness:   Alexis [name] is a 52 y.o. female with medical history significant of fibromyalgia, IBS, chronic pain, colitis comes in with one week of worsening generalized abdominal pain and 3 days of bloody diarrhea with nausea and vomiting.  Chills but no fevers.  Pt reports chronic cholitis issues that have been going since 2013, has had 6 colonscopies with no definitive diagnosis of any chronic GI process.  She use to see GI here but changed to [facility] years ago.  No wt loss.  No blood in vomit.  Pt reports no vomiting since arrival to ED but very nauseas.  Pt referred for admission for vomiting and unable to keep anything down.  She does not report any recent hospitalizations.     Hospital Course by Problem:   Bloody diarrhea due to enteroaggregative E coli -symptomatic treatment -will finish short course of cipro -tolerating diet  Patient missed appointment with pain clinic yesterday so will provide short prescription until she can get into her pain medicine office being today is a holiday-- her board of pharmacy was reviewed      Medical Consultants:    None.   Discharge Exam:   Vitals:   05/18/17 0553 05/18/17 1329  BP: 120/78 139/72  Pulse: 78 80  Resp: 14 16  Temp: 98.2 F (36.8 C) 98.4 F (36.9 C)  SpO2: 98% 96%   Vitals:   05/17/17 0836  05/17/17 2222 05/18/17 0553 05/18/17 1329  BP: 127/85 135/82 120/78 139/72  Pulse:  84 78 80  Resp:  14 14 16   Temp:  98.4 F (36.9 C) 98.2 F (36.8 C) 98.4 F (36.9 C)  TempSrc:  Oral Oral   SpO2:  97% 98% 96%    Gen:  NAD- BMs have lessened   The results of significant diagnostics from this hospitalization (including imaging, microbiology, ancillary and laboratory) are listed below for reference.     Procedures and Diagnostic Studies:   Ct Abdomen Pelvis W Contrast  Result Date: 05/17/2017 CLINICAL DATA:  Acute onset of generalized abdominal pain and cramping. Nausea and vomiting. EXAM: CT ABDOMEN AND PELVIS WITH CONTRAST TECHNIQUE: Multidetector CT imaging of the abdomen and pelvis was performed using the standard protocol following bolus administration of intravenous contrast. CONTRAST:  171mL ISOVUE-300 IOPAMIDOL (ISOVUE-300) INJECTION 61% COMPARISON:  CT of the abdomen and pelvis performed 01/21/2016 FINDINGS: Lower chest: The visualized lung bases are grossly clear. The visualized portions of the mediastinum are unremarkable. Hepatobiliary: The liver is unremarkable in appearance. The gallbladder is unremarkable in appearance. The common bile duct remains normal in caliber. Pancreas: The pancreas is within normal limits. Spleen: The spleen is unremarkable in appearance. Adrenals/Urinary Tract: The adrenal glands are unremarkable in appearance. The kidneys are within normal limits. There is no evidence of hydronephrosis. No renal or ureteral stones are identified. No perinephric stranding is seen. Stomach/Bowel: The stomach is unremarkable in appearance.  The small bowel is within normal limits. The appendix is normal in caliber, without evidence of appendicitis. The colon is unremarkable in appearance. Vascular/Lymphatic: The abdominal aorta is unremarkable in appearance. The inferior vena cava is grossly unremarkable. No retroperitoneal lymphadenopathy is seen. No pelvic sidewall  lymphadenopathy is identified. Reproductive: The bladder is mildly distended and grossly unremarkable. The uterus is unremarkable in appearance. The ovaries are relatively symmetric. No suspicious adnexal masses are seen. Other: No additional soft tissue abnormalities are seen. Musculoskeletal: No acute osseous abnormalities are identified. Vacuum phenomenon is noted at L5-S1. The visualized musculature is unremarkable in appearance. IMPRESSION: Unremarkable contrast-enhanced CT of the abdomen and pelvis. Electronically Signed   By: Garald Balding M.D.   On: 05/17/2017 01:05     Labs:   Basic Metabolic Panel: Recent Labs  Lab 05/16/17 1116 05/17/17 0402  NA 141 140  K 3.1* 3.9  CL 106 111  CO2 24 19*  GLUCOSE 113* 174*  BUN 7 8  CREATININE 0.76 0.94  CALCIUM 10.1 9.2   GFR CrCl cannot be calculated (Unknown ideal weight.). Liver Function Tests: Recent Labs  Lab 05/16/17 1116  AST 16  ALT 12*  ALKPHOS 108  BILITOT 0.8  PROT 8.7*  ALBUMIN 4.4   Recent Labs  Lab 05/16/17 1116  LIPASE 26   No results for input(s): AMMONIA in the last 168 hours. Coagulation profile No results for input(s): INR, PROTIME in the last 168 hours.  CBC: Recent Labs  Lab 05/16/17 1116 05/17/17 0402  WBC 15.4* 13.8*  HGB 14.7 12.2  HCT 43.5 36.0  MCV 86.0 86.1  PLT 477* 405*   Cardiac Enzymes: No results for input(s): CKTOTAL, CKMB, CKMBINDEX, TROPONINI in the last 168 hours. BNP: Invalid input(s): POCBNP CBG: No results for input(s): GLUCAP in the last 168 hours. D-Dimer No results for input(s): DDIMER in the last 72 hours. Hgb A1c No results for input(s): HGBA1C in the last 72 hours. Lipid Profile No results for input(s): CHOL, HDL, LDLCALC, TRIG, CHOLHDL, LDLDIRECT in the last 72 hours. Thyroid function studies No results for input(s): TSH, T4TOTAL, T3FREE, THYROIDAB in the last 72 hours.  Invalid input(s): FREET3 Anemia work up No results for input(s): VITAMINB12, FOLATE,  FERRITIN, TIBC, IRON, RETICCTPCT in the last 72 hours. Microbiology Recent Results (from the past 240 hour(s))  Gastrointestinal Panel by PCR , Stool     Status: Abnormal   Collection Time: 05/17/17  6:45 AM  Result Value Ref Range Status   Campylobacter species NOT DETECTED NOT DETECTED Final   Plesimonas shigelloides NOT DETECTED NOT DETECTED Final   Salmonella species NOT DETECTED NOT DETECTED Final   Yersinia enterocolitica NOT DETECTED NOT DETECTED Final   Vibrio species NOT DETECTED NOT DETECTED Final   Vibrio cholerae NOT DETECTED NOT DETECTED Final   Enteroaggregative E coli (EAEC) DETECTED (A) NOT DETECTED Final    Comment: RESULT CALLED TO, READ BACK BY AND VERIFIED WITH: LORI BERDIK AT 1740 ON 05/17/2017 JJB    Enteropathogenic E coli (EPEC) NOT DETECTED NOT DETECTED Final   Enterotoxigenic E coli (ETEC) NOT DETECTED NOT DETECTED Final   Shiga like toxin producing E coli (STEC) NOT DETECTED NOT DETECTED Final   Shigella/Enteroinvasive E coli (EIEC) NOT DETECTED NOT DETECTED Final   Cryptosporidium NOT DETECTED NOT DETECTED Final   Cyclospora cayetanensis NOT DETECTED NOT DETECTED Final   Entamoeba histolytica NOT DETECTED NOT DETECTED Final   Giardia lamblia NOT DETECTED NOT DETECTED Final   Adenovirus F40/41 NOT DETECTED  NOT DETECTED Final   Astrovirus NOT DETECTED NOT DETECTED Final   Norovirus GI/GII NOT DETECTED NOT DETECTED Final   Rotavirus A NOT DETECTED NOT DETECTED Final   Sapovirus (I, II, IV, and V) NOT DETECTED NOT DETECTED Final    Comment: Performed at [facility], [address] [place],  [address]  C difficile quick scan w PCR reflex     Status: None   Collection Time: 05/17/17  1:27 PM  Result Value Ref Range Status   C Diff antigen NEGATIVE NEGATIVE Final   C Diff toxin NEGATIVE NEGATIVE Final   C Diff interpretation No C. difficile detected.  Final     Discharge Instructions:   Discharge Instructions    Discharge instructions    Complete by:  As directed    Follow up in AM with pain clinic doctor for ASAP appointment   Increase activity slowly   Complete by:  As directed      Allergies as of 05/18/2017      Reactions   Adhesive [tape] Other (See Comments)   SKIN IRRITATION   Bactrim Nausea And Vomiting   Clarithromycin Nausea And Vomiting   Ibuprofen Nausea And Vomiting, Other (See Comments)   SEVERE STOMACH PAIN - CAN TAKE IV WITH NO ISSUES   Nexium [esomeprazole] Other (See Comments)   Cramps and headaches   Nsaids Other (See Comments)   AVOIDS DUE TO GERD   Other Nausea And Vomiting, Other (See Comments)   Pulp in all fruits - can only eat if cooked   Peg 3350-electrolytes Other (See Comments)   Aspiration    Sulfa Antibiotics Nausea And Vomiting, Other (See Comments)   ORAL MED.   (PER  , IV,  NO ISSUES)   Tisagenlecleucel    Tolmetin Other (See Comments)   AVOIDS DUE TO GERD   Latex Rash, Other (See Comments)   SEVERE ITCHING      Medication List    TAKE these medications   albuterol 108 (90 Base) MCG/ACT inhaler Commonly known as:  PROVENTIL HFA;VENTOLIN HFA Inhale 2 puffs into the lungs every 4 (four) hours as needed for wheezing or shortness of breath.   albuterol (2.5 MG/3ML) 0.083% nebulizer solution Commonly known as:  PROVENTIL Take 2.5 mg by nebulization every 6 (six) hours as needed for wheezing or shortness of breath.   ARMOUR THYROID 120 MG tablet Generic drug:  thyroid Take 60-120 mg by mouth See admin instructions. Take 1/2 tablet on Sunday then take 1 tablet all the other days   atorvastatin 20 MG tablet Commonly known as:  LIPITOR Take 20 mg by mouth daily.   AZOPT 1 % ophthalmic suspension Generic drug:  brinzolamide Place 1 drop into both eyes 3 (three) times daily.   ciprofloxacin 500 MG tablet Commonly known as:  CIPRO Take 1 tablet (500 mg total) by mouth 2 (two) times daily.   cycloSPORINE 0.05 % ophthalmic emulsion Commonly known as:  RESTASIS Place 1  drop into both eyes 2 (two) times daily.   D3-50 50000 units capsule Generic drug:  Cholecalciferol Take 50,000 Units by mouth once a week.   dicyclomine 10 MG capsule Commonly known as:  BENTYL TAKE 2 CAPSULE 4 TIMES DAILY   DULERA 200-5 MCG/ACT Aero Generic drug:  mometasone-formoterol Inhale 2 puffs into the lungs 2 (two) times daily.   hydrOXYzine 50 MG tablet Commonly known as:  ATARAX/VISTARIL Take 50 mg by mouth 4 (four) times daily.   levorphanol 2 MG tablet  Commonly known as:  LEVODROMORAN Take 4 mg by mouth every 6 (six) hours.   LYRICA 75 MG capsule Generic drug:  pregabalin Take 75 mg by mouth at bedtime.   omeprazole-sodium bicarbonate 40-1100 MG capsule Commonly known as:  ZEGERID Take 1 capsule by mouth daily.   ondansetron 4 MG tablet Commonly known as:  ZOFRAN Take 4 mg by mouth every 8 (eight) hours as needed for nausea or vomiting.   OxyCODONE HCl 30 MG Taba Take 30 mg by mouth 4 (four) times daily as needed (pain). What changed:    when to take this  reasons to take this   predniSONE 20 MG tablet Commonly known as:  DELTASONE Take 20 mg by mouth every other day.   promethazine 25 MG tablet Commonly known as:  PHENERGAN Take 25 mg by mouth every 6 (six) hours as needed for nausea or vomiting.   propranolol 10 MG tablet Commonly known as:  INDERAL Take 10 mg by mouth 2 (two) times daily.   VESICARE 10 MG tablet Generic drug:  solifenacin Take 10 mg by mouth at bedtime.      Follow-up Information    Osei-Bonsu, Iona Beard, MD Follow up in 1 week(s).   Specialty:  Internal Medicine Contact information: [address] [place] [address] [phone]            Time coordinating discharge: 35 min  Signed:  Geradine Girt   [facility] 05/18/2017, 2:32 PM

## 2017-05-18 NOTE — Progress Notes (Signed)
Discharge instructions (including medications) discussed with and copy provided to patient/caregiver 

## 2018-11-23 ENCOUNTER — Encounter: Payer: Self-pay | Admitting: Neurology

## 2018-11-23 ENCOUNTER — Other Ambulatory Visit: Payer: Self-pay

## 2018-11-23 ENCOUNTER — Telehealth: Payer: Self-pay | Admitting: Neurology

## 2018-11-23 ENCOUNTER — Ambulatory Visit: Payer: Medicaid Other | Admitting: Neurology

## 2018-11-23 VITALS — BP 140/86 | HR 93 | Temp 97.7°F | Ht 64.0 in | Wt 238.3 lb

## 2018-11-23 DIAGNOSIS — R269 Unspecified abnormalities of gait and mobility: Secondary | ICD-10-CM | POA: Diagnosis not present

## 2018-11-23 DIAGNOSIS — G459 Transient cerebral ischemic attack, unspecified: Secondary | ICD-10-CM | POA: Diagnosis not present

## 2018-11-23 DIAGNOSIS — E538 Deficiency of other specified B group vitamins: Secondary | ICD-10-CM | POA: Diagnosis not present

## 2018-11-23 MED ORDER — ALPRAZOLAM 0.5 MG PO TABS: ORAL_TABLET | ORAL | 0 refills | Status: DC

## 2018-11-23 NOTE — Telephone Encounter (Signed)
Medicaid order sent to GI. They will obtain the auth and reach out to the patient to schedule.  

## 2018-11-23 NOTE — Progress Notes (Signed)
Reason for visit: Tremor, gait disorder, dizziness, low back pain, numbness  Referring physician: Dr. [name] is a 53 y.o. female  History of present illness:  Alexis [name] is a 53 year old right-handed black female with a history of tremor.  The patient was seen through this office about 4 years ago for evaluation of the tremor that had its onset in 2011.  She has a history of fibromyalgia and chronic low back pain, she has been seen by Dr. Christella Noa for this and will be following up soon in this regard.  The patient has a history of diabetes, prior nerve conduction studies did not show evidence of a diabetic neuropathy.  She has a history of thyroid cancer.  The patient is on chronic steroid treatment for Sjogren's syndrome and colitis of some sort.  She is followed through a pain center and is on chronic opioid therapy.  She is sent to the office today with problems with balance, when seen back in 2016, she reports some balance issues at that time as well.  The patient does take Lyrica but she only takes 75 mg at night.  She reports a 1 or 40-month history of intermittent left-sided numbness and paralysis that may occur, this started with the left leg but has spread to the left arm.  The episodes may last 30 minutes and then clear.  The patient has had MRI of the brain done in 2018 that was normal.  She claims that she is dropping things from her hands, left greater than right.  She has occasional numbness of the left face.  She also reports some dizziness that is present with sitting and with standing and may be associated with her balance problems, she claims that she will stumble and fall frequently.  She does not use a cane for ambulation.  She reports cramps in the lower extremities and feet that may come and go.  She does have some neck and low back pain that again is chronic in nature.  She comes here for further evaluation.  Past Medical History:  Diagnosis Date  .  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 Asthma   . Bilateral dry eyes   . Cervical spondylosis    C5-6  . CHF (congestive heart failure) ([place])   . Chronic back pain   . Chronic bronchitis ([place])   . Chronic colitis   . Chronic lower GI bleeding   . Complication of anesthesia    has awakened in past/  RIGHT VOCAL CORD PARALYSIS  POST TOTAL THYROIDECTOMY  03-08-2009 ([facility])  . Degenerative disk disease    "cervical to sacrum" (05/17/2017)  . Diabetic neuropathy, type II diabetes mellitus ([place]) 12/14/2014  . DJD (degenerative joint disease) of knee   . Dyspnea   . Endometriosis 01/18/01  . Family history of adverse reaction to anesthesia    "mom wakes up during procedures"  . Fibromyalgia   . Gastric ulcer   . GERD (gastroesophageal reflux disease)   . Glaucoma of both eyes   . H/O hiatal hernia   . High cholesterol   . History of colon polyps   . Hypertension   . Hypothyroidism, postsurgical   . IBS (irritable bowel syndrome)   . Interstitial cystitis   . Lymphedema   . Migraine    "monthly" (05/17/2017)  . OSA on CPAP   . Osteoarthritis    "all over; mainly knees and back" (05/17/2017)  .  Sickle cell trait ([place])   . Sjogren's syndrome ([place])   . Thyroid cancer ([place])   . Tremor 12/14/2014  . Uterine fibroid   . Varicose veins   . Vestibular dizziness   . Vocal cord paralysis, unilateral complete    "left";  POST TOTAL THYROIDECTOMY    Past Surgical History:  Procedure Laterality Date  . ACHILLES TENDON REPAIR Left 2007  . BUNIONECTOMY WITH HAMMERTOE RECONSTRUCTION AND GASTROC SLIDE Left FEB  2011   REMOVAL HARDWARE  NOV  2011  . CARDIAC CATHETERIZATION N/A 10/28/2015   Procedure: Right Heart Cath;  Surgeon: Adrian Prows, MD;  Location: [facility];  Service: Cardiovascular;  Laterality: N/A;  . COLONOSCOPY WITH ESOPHAGOGASTRODUODENOSCOPY (EGD)  MULTIPLE --  LAST ONE   NOV  2014  . CYSTO/  BILATERAL RETROGRADE PYELOGRAM/  HYDRODISTENTION/   INSTILLATION THERAPY  05-08-2003  . CYSTO/  HYDRODISTENTION/  INSTILLATION THERAPY  06-04-2005  &  03-04-2007  . CYSTO/  URETHRAL DILATION /  BILATERAL UNROOFING URETEROCELE/  BILATERAL URETERAL STENT PLACEMENT  12-01-2002  . D & C HYSTEROSCOPY/  REMOVAL IUD  12-06-2003  . DE QUERVAIN'S RELEASE Left ?09/2005  . DIAGNOSTIC LAPAROSCOPY    . DILATATION AND CURETTAGE WITH SUCTION  05-29-2005   RETAINED PLACENTA  . DILATION AND CURETTAGE OF UTERUS    . DX LAPAROSCOPY/  PELVIC BX'S/  LYSIS ADHESIONS  06-26-2000  . FOOT HARDWARE REMOVAL Left 03/2010   REMOVAL HARDWARE "related to earlier hammertoe OR"  . HYDRADENITIS EXCISION  06/29/2011   Procedure: EXCISION HYDRADENITIS AXILLA;  Surgeon: Hermelinda Dellen, MD;  Location: [facility];  Service: Plastics;;  right breast hydradenitis excioion  . HYDRADENITIS EXCISION Left 2000  . INCISION AND DRAINAGE ABSCESS ANAL  02-14-2001  . KNEE ARTHROSCOPY WITH LATERAL MENISECTOMY Right 09/08/2013   Procedure: RIGHT KNEE ARTHROSCOPY PARTIAL LATERAL MENISCECTOMY AND DEBRIDEMENT ;  Surgeon: Johnn Hai, MD;  Location: [place] [facility];  Service: Orthopedics;  Laterality: Right;  . LAPAROSCOPY ABDOMEN DIAGNOSTIC  SEPT  2003   [place]   AND PLACEMENT IUD  . MASS EXCISION  10/29/2003   WIDE EXCISION CHEST AREA  . PILONIDAL CYST EXCISION  1983  . REDUCTION MAMMAPLASTY  1997  . TEE WITHOUT CARDIOVERSION N/A 09/26/2015   Procedure: TRANSESOPHAGEAL ECHOCARDIOGRAM (TEE);  Surgeon: Adrian Prows, MD;  Location: [facility];  Service: Cardiovascular;  Laterality: N/A;  . TEMPORAL ARTERY BIOPSY / LIGATION Left 2010  . TEMPORAL ARTERY BIOPSY / LIGATION Right 06-16-2002  . TONSILLECTOMY  1990  . TOTAL THYROIDECTOMY  11/06/2008    Family History  Problem Relation Age of Onset  . Atrial fibrillation Mother   . Diabetes Mother   . Thyroid disease Mother   . Cushing syndrome Mother   . Tremor Mother   . Atrial fibrillation Father   .  Glaucoma Father   . Tremor Sister   . Cancer Paternal Aunt        breast, colon    Social history:  reports that she has never smoked. She has never used smokeless tobacco. She reports current alcohol use. She reports that she does not use drugs.  Medications:  Prior to Admission medications   Medication Sig Start Date End Date Taking? Authorizing Provider  albuterol (PROVENTIL HFA;VENTOLIN HFA) 108 (90 BASE) MCG/ACT inhaler Inhale 2 puffs into the lungs every 4 (four) hours as needed for wheezing or shortness of breath.    Yes [provider]  ARMOUR THYROID 120  MG tablet Take 60-120 mg by mouth See admin instructions. Take 1/2 tablet on Sunday then take 1 tablet all the other days 09/25/16  Yes [provider]  diclofenac sodium (VOLTAREN) 1 % GEL APP 4 GRAMS EXT AA QID 11/11/18  Yes [provider]  dicyclomine (BENTYL) 10 MG capsule TAKE 2 CAPSULE 4 TIMES DAILY 08/11/16  Yes [provider]  DULERA 200-5 MCG/ACT AERO Inhale 2 puffs into the lungs 2 (two) times daily. 05/03/17  Yes [provider]  hydrOXYzine (ATARAX/VISTARIL) 50 MG tablet Take 50 mg by mouth 4 (four) times daily. 09/15/16  Yes [provider]  LYRICA 75 MG capsule Take 75 mg by mouth at bedtime.  07/24/16  Yes [provider]  omeprazole-sodium bicarbonate (ZEGERID) 40-1100 MG capsule Take 1 capsule by mouth daily. 10/11/16  Yes [provider]  ondansetron (ZOFRAN) 4 MG tablet Take 4 mg by mouth every 8 (eight) hours as needed for nausea or vomiting.   Yes [provider]  OxyCODONE HCl 30 MG TABA Take 30 mg by mouth 4 (four) times daily as needed (pain). 05/18/17  Yes Vann, Jessica U, DO  predniSONE (DELTASONE) 20 MG tablet Take 20 mg by mouth every other day. 10/11/16  Yes [provider]  promethazine (PHENERGAN) 25 MG tablet Take 25 mg by mouth every 6 (six) hours as needed for nausea or vomiting.   Yes [provider]   solifenacin (VESICARE) 10 MG tablet Take 10 mg by mouth at bedtime.  07/03/15  Yes [provider]  XTAMPZA ER 13.5 MG C12A  11/03/18  Yes [provider]      Allergies  Allergen Reactions  . Adhesive [Tape] Other (See Comments)    SKIN IRRITATION  . Bactrim Nausea And Vomiting  . Clarithromycin Nausea And Vomiting  . Ibuprofen Nausea And Vomiting and Other (See Comments)    SEVERE STOMACH PAIN - CAN TAKE IV WITH NO ISSUES  . Nexium [Esomeprazole] Other (See Comments)    Cramps and headaches  . Nsaids Other (See Comments)    AVOIDS DUE TO GERD  . Other Nausea And Vomiting and Other (See Comments)    Pulp in all fruits - can only eat if cooked  . Peg 3350-Electrolytes Other (See Comments)    Aspiration   . Sulfa Antibiotics Nausea And Vomiting and Other (See Comments)    ORAL MED.   (PER  , IV,  NO ISSUES)  . Tisagenlecleucel   . Tolmetin Other (See Comments)    AVOIDS DUE TO GERD  . Latex Rash and Other (See Comments)    SEVERE ITCHING    ROS:  Out of a complete 14 system review of symptoms, the patient complains only of the following symptoms, and all other reviewed systems are negative.  Neck and low back pain Muscle cramps, legs and feet Balance problems Dizziness  Blood pressure 140/86, pulse 93, temperature 97.7 F (36.5 C), temperature source Temporal, height 5\' 4"  (1.626 m), weight 238 lb 5 oz (108.1 kg), last menstrual period 07/25/2014, SpO2 96 %.  Physical Exam  General: The patient is alert and cooperative at the time of the examination.  The patient is moderately to markedly obese.  Eyes: Pupils are equal, round, and reactive to light. Discs are flat bilaterally.  Neck: The neck is supple, no carotid bruits are noted.  Respiratory: The respiratory examination is clear.  Cardiovascular: The cardiovascular examination reveals a regular rate and rhythm, no obvious murmurs or rubs  are noted.  Neuromuscular: Range move the low back is  notable that the patient can flex to about 90 degrees, she has relatively full range of movement of the cervical spine.  Skin: Extremities are without significant edema.  Neurologic Exam  Mental status: The patient is alert and oriented x 3 at the time of the examination. The patient has apparent normal recent and remote memory, with an apparently normal attention span and concentration ability.  Cranial nerves: Facial symmetry is present. There is good sensation of the face to pinprick and soft touch on the left, decreased on the right, the patient splits the midline of the vibration sensation, decreased on the left, more on the right. The strength of the facial muscles and the muscles to head turning and shoulder shrug are normal bilaterally. Speech is well enunciated, no aphasia or dysarthria is noted. Extraocular movements are full. Visual fields are full. The tongue is midline, and the patient has symmetric elevation of the soft palate. No obvious hearing deficits are noted.  Motor: The motor testing reveals 5 over 5 strength of all 4 extremities. Good symmetric motor tone is noted throughout.  Sensory: Sensory testing is notable for decrease in pinprick sensation on the left arm and leg as compared to the right, vibration is also decreased on the left arm and leg.  Position sense is intact on all 4 extremities.  The patient has a stocking pattern pinprick sensory deficit 1/2 way up the lower extremities bilaterally. No evidence of extinction is noted.  Coordination: Cerebellar testing reveals good finger-nose-finger and heel-to-shin bilaterally.  The patient is able to draw a spiral without any tremor translated into handwriting.  No observable tremor was noted on clinical examination today.  Gait and station: Gait is normal. Tandem gait is minimally unsteady. Romberg is negative. No drift is seen.  Reflexes: Deep tendon reflexes are symmetric and normal bilaterally. Toes are downgoing  bilaterally.   Assessment/Plan:  1.  Left hemisensory deficit, nonorganic  2.  History of fibromyalgia  3.  History of chronic neck and low back pain  4.  History of dizziness, gait instability  5.  History of tremor  6.  Bilateral leg cramps  The patient has multiple somatic complaints.  No tremor seen on clinical examination today.  Just as in 2016, the patient appears to have a nonorganic left-sided sensory alteration.  The patient is now reporting episodes of left-sided numbness and paralysis.  For this reason, MRI of the brain will be repeated, a carotid Doppler study will be done.  The patient will be set up for physical therapy for gait training, her ability to ambulate appears to be only minimally affected.  She will follow-up here in 4 months.  Blood work will be done today.  Jill Alexanders MD 11/23/2018 9:24 AM  [facility] [address] [place], [place] [address]  Phone [phone] Fax [phone]

## 2018-11-25 LAB — HIV ANTIBODY (ROUTINE TESTING W REFLEX): HIV Screen 4th Generation wRfx: NONREACTIVE

## 2018-11-25 LAB — COPPER, SERUM: Copper: 128 ug/dL (ref 72–166)

## 2018-11-25 LAB — RPR: RPR Ser Ql: NONREACTIVE

## 2018-11-25 LAB — VITAMIN B12: Vitamin B-12: 420 pg/mL (ref 232–1245)

## 2018-11-28 NOTE — Telephone Encounter (Signed)
Medicaid auth: [id number] 9exp. 11/28/18 to 05/27/19).  I spoke to the patient she would like her MRI to be sedated because she is very claustrophic. When you get a chance can you put a new order in for [facility] for it to be sedated.

## 2018-11-28 NOTE — Telephone Encounter (Signed)
The order for MRI of the brain was changed to be done at [facility] under general anesthesia.

## 2018-11-28 NOTE — Addendum Note (Signed)
Addended by: Kathrynn Ducking on: 11/28/2018 01:09 PM   Modules accepted: Orders

## 2018-11-28 NOTE — Telephone Encounter (Signed)
Medicaid pending faxed notes. For [facility].

## 2018-11-28 NOTE — Telephone Encounter (Signed)
Noted, faxed info to [facility] for them to reach out to the patient to schedule.

## 2018-11-30 NOTE — Telephone Encounter (Signed)
Patient is scheduled at [facility] for  12/29/18.

## 2018-12-05 ENCOUNTER — Telehealth: Payer: Self-pay | Admitting: Neurology

## 2018-12-05 NOTE — Telephone Encounter (Signed)
Brandi with the pre service center returned my call and advised Pt is schedule for her VAS US Carotid US tomorrow at 3 pm and Authorization through [facility] is needed. I advised I would speak with our referral department in regards to this authorization.  Brandi # is [place].

## 2018-12-05 NOTE — Telephone Encounter (Signed)
LVM 12/05/2018

## 2018-12-05 NOTE — Telephone Encounter (Signed)
Patient approved [id number] EXP. 12/05/2018 - 06/03/2019 [place] Doppler [phone]

## 2018-12-05 NOTE — Telephone Encounter (Signed)
Lvm asking for the pre service department to call me back so we could further discuss.

## 2018-12-05 NOTE — Telephone Encounter (Signed)
Noted, thank you

## 2018-12-05 NOTE — Telephone Encounter (Signed)
Pre Service center call and stated pt needs an authorization done in order for her to get ultrasound

## 2018-12-06 ENCOUNTER — Other Ambulatory Visit: Payer: Self-pay

## 2018-12-06 ENCOUNTER — Telehealth: Payer: Self-pay | Admitting: Neurology

## 2018-12-06 ENCOUNTER — Ambulatory Visit (HOSPITAL_COMMUNITY)
Admission: RE | Admit: 2018-12-06 | Discharge: 2018-12-06 | Disposition: A | Discharge status: Home / Self Care | Payer: Medicaid Other | Source: Ambulatory Visit | Attending: Neurology | Admitting: Neurology

## 2018-12-06 DIAGNOSIS — G459 Transient cerebral ischemic attack, unspecified: Secondary | ICD-10-CM | POA: Diagnosis present

## 2018-12-06 DIAGNOSIS — R269 Unspecified abnormalities of gait and mobility: Secondary | ICD-10-CM

## 2018-12-06 NOTE — Telephone Encounter (Signed)
I called the patient.  The carotid Doppler study was unremarkable.  MRI of the brain is pending.    Carotid doppler 12/06/18:  Summary: Right Carotid: Velocities in the right ICA are consistent with a 1-39% stenosis.  Left Carotid: Velocities in the left ICA are consistent with a 1-39% stenosis.  Vertebrals: Bilateral vertebral arteries demonstrate antegrade flow.

## 2018-12-26 ENCOUNTER — Ambulatory Visit (HOSPITAL_COMMUNITY)
Admission: RE | Admit: 2018-12-26 | Discharge: 2018-12-26 | Disposition: A | Discharge status: Home / Self Care | Payer: Medicaid Other | Source: Ambulatory Visit | Attending: Neurology | Admitting: Neurology

## 2018-12-26 DIAGNOSIS — Z01812 Encounter for preprocedural laboratory examination: Secondary | ICD-10-CM | POA: Diagnosis present

## 2018-12-26 DIAGNOSIS — Z20828 Contact with and (suspected) exposure to other viral communicable diseases: Secondary | ICD-10-CM | POA: Diagnosis not present

## 2018-12-26 LAB — SARS CORONAVIRUS 2 (TAT 6-24 HRS): SARS Coronavirus 2: NEGATIVE

## 2018-12-27 ENCOUNTER — Other Ambulatory Visit: Payer: Self-pay

## 2018-12-27 ENCOUNTER — Encounter (HOSPITAL_COMMUNITY): Payer: Self-pay | Admitting: *Deleted

## 2018-12-27 NOTE — Progress Notes (Signed)
Pt denies SOB, chest pain, and being under the care of a cardiologist. Pt denies having a stress test. Pt stated that her PCP is Dr. Iona Beard Osei-Bonsu. Pt made aware to stp taking vitamins and herbal medications. Nurse requested LOV note, echo and EKG from [facility] Junita Push, PA; awaiting response. Pt denies having a chest x ray in the last year. Pt denies recent labs. Pt denies that she and family members tested positive for COVID-19; pt reminded to quarantine.  Coronavirus Screening  Pt denies that she and family members experienced the following symptoms:  Cough yes/no: No Fever (>100.67F)  yes/no: No Runny nose yes/no: No Sore throat yes/no: No Difficulty breathing/shortness of breath  yes/no: No  Have you or a family member traveled in the last 14 days and where? yes/no: No  Pt reminded that hospital visitation restrictions are in effect and the importance of the restrictions.   Pt verbalized understanding of all pre-op instructions.  Pt chart forwarded to PA, Anesthesia for review of hx and anesthesia complication.  Pt to f/u with pain management specialist to fax order for MRI of spine.

## 2018-12-28 ENCOUNTER — Encounter (HOSPITAL_COMMUNITY): Payer: Self-pay | Admitting: Vascular Surgery

## 2018-12-28 NOTE — Progress Notes (Signed)
Anesthesia Chart Review: Alexis [name]   Case: [id number] Date/Time: 12/29/18 0845   Procedure: MRI BRAIN WITHOUT IV CONTRAST (N/A )   Anesthesia type: General   Pre-op diagnosis: TIA   Location: [facility] OR RADIOLOGY ROOM / [place] OR   Surgeon: Radiologist, Medication, MD      DISCUSSION: Patient is a 53 year old female scheduled for a MRI brain under anesthesia (due to claustrophobia) on 12/29/18. Test was ordered by neurologist Dr. Jannifer Franklin. He signed an updated H&P on 11/30/18 (scanned under Media tab, Radiology Order). According to his 11/23/18 note, she had known "nonorganic left-sided sensory alteration", but reported new left-sided numbness and paralysis. Brain MRI was ordered to further evaluate.      History includes never smoker, post-operative N/V, DM2, papillary thyroid cancer (s/p total thyroidectomy 7/51/70 complicated by left vocal cord paralysis; post-surgical hypothyroidism), tremor, fibromyalgia, Sjogren's syndrome, asthma, dyspnea, GERD, hiatal hernia, IBS, colitis, OSA, HTN, CHF (not-specified; reported prior to 2017), hypercholesterolemia, lymphedema, glaucoma, sickle cell trait, anemia, fatty liver, PTSD. She had two echocardiograms in 2017. TTE in March showed a small fenestrated of secundum ASD with "probable" bidirectional shunting, and TEE in May showed a possible small fenestrated pinhole with no atrial level shunt--there was no suggestion of intracardiac shunting by [place] 10/28/15.   Anesthesia concerns include post-operative N/V, "awaken" during anesthesia in the past, and history of left vocal cord paralysis.   COVID-19 test negative on 12/26/18. She is for labs and EKG (if not received from PCP) on the day of her procedure. Anesthesia team to review and evaluate patient on the day of surgery. Current med list includes prednisone 20 mg every other day. She is also on Xtampza ER (oxycodone) 13.5 mg Q 12 hours.    PROVIDERS: Benito Mccreedy, MD is PCP  Kathrynn Ducking, MD is  neurologist Iran Planas, MD is endocrinologist ([place]) - She is not routinely followed by a cardiologist, but was seen by Adrian Prows, MD in 2017 due to dyspnea and atypical chest pain. She had reported a history of CHF from years prior, but no real details known. She had a non-ischemic stress test, EF 46%, but EF 55% by echo. She had a RHC and TEE to assess for ASD and right heart pressures. Possible pinhole ASD seen with no shunt and normal RHC.     LABS: She is for labs on arrival.     EKG: Requested from PCP. If EKG within the past year not received then she will get updated EKG on arrival.   EKG 10/20/16:  Sinus rhythm Probable left atrial enlargement Abnormal R-wave progression, early transition Left ventricular hypertrophy Borderline abnrm T, anterolateral leads Confirmed by Nat Christen [phone]) on 10/22/2016 2:19:39 PM   CV: Carotid US 12/06/18: Summary: Right Carotid: Velocities in the right ICA are consistent with a 1-39% stenosis. Left Carotid: Velocities in the left ICA are consistent with a 1-39% stenosis. Vertebrals: Bilateral vertebral arteries demonstrate antegrade flow.  [place] 10/28/15: Conclusion: RA 7/4, mean 4 mmHg. RV 44/9, end-diastolic pressure 6 to limit her mercury. PA 19/9, the 14 mmHg. PA saturation 78%. Pulmonary Artery wedge 11/9, mean 8 mmHg. Aortic saturation 100%. Cardiac output 7.11, cardiac index 3.65 by Fick. QT/QS 1.0. Impression:   Normal right heart catheterizaton with preserved cardiac output and cardiac index. No suggestion of intracardiac shunting with normal Qp:Qs ratio.  TEE 09/26/15: Study Conclusions - Left ventricle: Systolic function was normal. Wall motion was   normal; there were no regional wall  motion abnormalities. - Left atrium: No evidence of thrombus in the atrial cavity or   appendage. - Right atrium: No evidence of thrombus in the atrial cavity or   appendage. - Atrial septum: There was a possible, small  fenestrated pinhole   ASD by color Doppler only There was no atrial level shunt. - Impressions: Normal study. Impressions: - Normal study. Normal pulmonary artery pressure.  Lexiscan myoview stress test 07/22/15 [facility]):  Impression: 1. The resting electrocardiogram demonstrated normal sinus rhythm, normal resting conduction and no resting arrhythmias. Stress EKG is non-diagnostic for ischemia as it a pharmacologic stress using Lexiscan. Stress symptoms included dyspnea. 2. Myocardial perfusion imaging is normal. Overall left ventricular systolic function was normal without regional wall motion abnormalities. The left ventricular ejection fraction was visually normal but calculated at 46%. This is a low risk study.   Past Medical History:  Diagnosis Date  . 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 Anemia   . ASD (atrial septal defect)    per pt  . Asthma   . Bilateral dry eyes   . Cervical spondylosis    C5-6  . CHF (congestive heart failure) ([place])   . Chronic back pain   . Chronic bronchitis ([place])   . Chronic colitis   . Chronic lower GI bleeding   . Complication of anesthesia    has awakened in past/  RIGHT VOCAL CORD PARALYSIS  POST TOTAL THYROIDECTOMY  03-08-2009 ([facility])  . Degenerative disk disease    "cervical to sacrum" (05/17/2017)  . Diabetic neuropathy, type II diabetes mellitus ([place]) 12/14/2014  . DJD (degenerative joint disease) of knee   . Dyspnea   . Endometriosis 01/18/01  . Family history of adverse reaction to anesthesia    "mom wakes up during procedures"  . Fatty liver   . Fibromyalgia   . Gastric ulcer   . GERD (gastroesophageal reflux disease)   . Glaucoma of both eyes   . H/O hiatal hernia   . High cholesterol   . History of colon polyps   . Hypertension   . Hypothyroidism, postsurgical   . IBS (irritable bowel syndrome)   . Interstitial cystitis   . Lymphedema   . Migraine    "monthly" (05/17/2017)  . OSA on CPAP   .  Osteoarthritis    "all over; mainly knees and back" (05/17/2017)  . PONV (postoperative nausea and vomiting)   . PTSD (post-traumatic stress disorder)   . Sickle cell trait ([place])   . Sjogren's syndrome ([place])   . Thyroid cancer ([place])   . Tremor 12/14/2014  . Uterine fibroid   . Varicose veins   . Vestibular dizziness   . Vocal cord paralysis, unilateral complete    "left";  POST TOTAL THYROIDECTOMY    Past Surgical History:  Procedure Laterality Date  . ACHILLES TENDON REPAIR Left 2007  . BUNIONECTOMY WITH HAMMERTOE RECONSTRUCTION AND GASTROC SLIDE Left FEB  2011   REMOVAL HARDWARE  NOV  2011  . CARDIAC CATHETERIZATION N/A 10/28/2015   Procedure: Right Heart Cath;  Surgeon: Adrian Prows, MD;  Location: [facility];  Service: Cardiovascular;  Laterality: N/A;  . COLONOSCOPY WITH ESOPHAGOGASTRODUODENOSCOPY (EGD)  MULTIPLE --  LAST ONE   NOV  2014  . CYSTO/  BILATERAL RETROGRADE PYELOGRAM/  HYDRODISTENTION/  INSTILLATION THERAPY  05-08-2003  . CYSTO/  HYDRODISTENTION/  INSTILLATION THERAPY  06-04-2005  &  03-04-2007  . CYSTO/  URETHRAL DILATION /  BILATERAL  UNROOFING URETEROCELE/  BILATERAL URETERAL STENT PLACEMENT  12-01-2002  . D & C HYSTEROSCOPY/  REMOVAL IUD  12-06-2003  . DE QUERVAIN'S RELEASE Left ?09/2005  . DIAGNOSTIC LAPAROSCOPY    . DILATATION AND CURETTAGE WITH SUCTION  05-29-2005   RETAINED PLACENTA  . DILATION AND CURETTAGE OF UTERUS    . DX LAPAROSCOPY/  PELVIC BX'S/  LYSIS ADHESIONS  06-26-2000  . FOOT HARDWARE REMOVAL Left 03/2010   REMOVAL HARDWARE "related to earlier [place] OR"  . HYDRADENITIS EXCISION  06/29/2011   Procedure: EXCISION HYDRADENITIS AXILLA;  Surgeon: Hermelinda Dellen, MD;  Location: [place];  Service: Plastics;;  right breast hydradenitis excioion  . HYDRADENITIS EXCISION Left 2000  . INCISION AND DRAINAGE ABSCESS ANAL  02-14-2001  . KNEE ARTHROSCOPY WITH LATERAL MENISECTOMY Right 09/08/2013   Procedure: RIGHT KNEE  ARTHROSCOPY PARTIAL LATERAL MENISCECTOMY AND DEBRIDEMENT ;  Surgeon: Johnn Hai, MD;  Location: [place];  Service: Orthopedics;  Laterality: Right;  . LAPAROSCOPY ABDOMEN DIAGNOSTIC  SEPT  2003   [place]   AND PLACEMENT IUD  . MASS EXCISION  10/29/2003   WIDE EXCISION CHEST AREA  . PILONIDAL CYST EXCISION  1983  . REDUCTION MAMMAPLASTY  1997  . TEE WITHOUT CARDIOVERSION N/A 09/26/2015   Procedure: TRANSESOPHAGEAL ECHOCARDIOGRAM (TEE);  Surgeon: Adrian Prows, MD;  Location: [place];  Service: Cardiovascular;  Laterality: N/A;  . TEMPORAL ARTERY BIOPSY / LIGATION Left 2010  . TEMPORAL ARTERY BIOPSY / LIGATION Right 06-16-2002  . TONSILLECTOMY  1990  . TOTAL THYROIDECTOMY  11/06/2008    MEDICATIONS: No current facility-administered medications for this encounter.    Marland Kitchen albuterol (PROVENTIL HFA;VENTOLIN HFA) 108 (90 BASE) MCG/ACT inhaler  . diclofenac sodium (VOLTAREN) 1 % GEL  . dicyclomine (BENTYL) 20 MG tablet  . DULERA 200-5 MCG/ACT AERO  . LYRICA 75 MG capsule  . omeprazole-sodium bicarbonate (ZEGERID) 40-1100 MG capsule  . ondansetron (ZOFRAN-ODT) 8 MG disintegrating tablet  . oxycodone (ROXICODONE) 30 MG immediate release tablet  . predniSONE (DELTASONE) 20 MG tablet  . promethazine (PHENERGAN) 25 MG tablet  . thyroid (ARMOUR) 120 MG tablet  . XTAMPZA ER 13.5 MG C12A  . ALPRAZolam (XANAX) 0.5 MG tablet  . hydrOXYzine (ATARAX/VISTARIL) 50 MG tablet  . solifenacin (VESICARE) 10 MG tablet  By med list, she is not currently taking alprazolam, hydroxyzine, Vesicare.   Myra Gianotti, PA-C Surgical Short Stay/Anesthesiology [facility] Phone [phone] [facility] Phone [phone] 12/28/2018 10:52 AM

## 2018-12-28 NOTE — Anesthesia Preprocedure Evaluation (Addendum)
Anesthesia Evaluation  Patient identified by MRN, date of birth, ID band Patient awake    Reviewed: Allergy & Precautions, NPO status , Patient's Chart, lab work & pertinent test results, reviewed documented beta blocker date and time   History of Anesthesia Complications (+) PONV and history of anesthetic complications  Airway Mallampati: II  TM Distance: >3 FB Neck ROM: Full    Dental  (+) Teeth Intact, Dental Advisory Given   Pulmonary shortness of breath, asthma , sleep apnea ,    Pulmonary exam normal breath sounds clear to auscultation       Cardiovascular hypertension, Pt. on home beta blockers +CHF  Normal cardiovascular exam Rhythm:Regular Rate:Normal  ASD   Neuro/Psych  Headaches, PSYCHIATRIC DISORDERS Anxiety TIA, NECK PAIN, LOW BACK PAIN, THORACIC PAIN TIA Neuromuscular disease    GI/Hepatic Neg liver ROS, hiatal hernia, PUD, GERD  ,  Endo/Other  diabetes, Well Controlled, Type 2Hypothyroidism papillary thyroid cancer (s/p total thyroidectomy 09/03/60 complicated by left vocal cord paralysis; post-surgical hypothyroidism  Renal/GU negative Renal ROS   IC    Musculoskeletal  (+) Arthritis , Fibromyalgia -  Abdominal   Peds  Hematology  (+) Blood dyscrasia, Sickle cell trait and anemia ,   Anesthesia Other Findings Day of surgery medications reviewed with the patient.  Reproductive/Obstetrics                          Anesthesia Physical Anesthesia Plan  ASA: III  Anesthesia Plan: General   Post-op Pain Management:    Induction: Intravenous  PONV Risk Score and Plan: 4 or greater and Dexamethasone, Ondansetron, Midazolam and Diphenhydramine  Airway Management Planned: Oral ETT  Additional Equipment:   Intra-op Plan:   Post-operative Plan: Extubation in OR  Informed Consent: I have reviewed the patients History and Physical, chart, labs and discussed the procedure  including the risks, benefits and alternatives for the proposed anesthesia with the patient or authorized representative who has indicated his/her understanding and acceptance.     Dental advisory given  Plan Discussed with: CRNA  Anesthesia Plan Comments: (PAT note written 02/06/2019 by Myra Gianotti, PA-C. )    Anesthesia Quick Evaluation

## 2018-12-29 ENCOUNTER — Ambulatory Visit (HOSPITAL_COMMUNITY): Admission: RE | Admit: 2018-12-29 | Payer: Medicaid Other | Source: Ambulatory Visit

## 2018-12-30 NOTE — Progress Notes (Signed)
Anesthesia Follow-up: Brain MRI under anesthesia moved to 02/07/19.   A more recent echo from 04/01/18 received from [facility] showing: Conclusions: 1.  Mild concentric LVH with normal LV internal dimension and wall motion.  LVEF 69%. 2.  The right ventricle is normal in size and contractility. 3.  There is no evidence of left ventricular diastolic dysfunction. 4.  The right atrium is normal in size.  The left atrium is normal. 5.  The aortic root is normal in size.  The aortic valve is mildly calcified. 6.  The tricuspid, pulmonic, aortic and mitral valves are morphologically normal and open adequately.  The mitral valve is mildly thickened. 7.  The intra-atrial and intraventricular septum are intact. 8.  Color-flow, pulse and continuous wave Doppler interrogation reveal (report ends at this point and is signed by Sherol Dade, MD, [facility])  Myra Gianotti, PA-C Surgical Short Stay/Anesthesiology [facility] Phone [phone] [facility] Phone [phone] 12/30/2018 11:59 AM

## 2019-01-12 ENCOUNTER — Other Ambulatory Visit (HOSPITAL_COMMUNITY): Payer: Self-pay | Admitting: Anesthesiology

## 2019-01-12 DIAGNOSIS — M546 Pain in thoracic spine: Secondary | ICD-10-CM

## 2019-01-12 DIAGNOSIS — M545 Low back pain, unspecified: Secondary | ICD-10-CM

## 2019-01-12 DIAGNOSIS — M542 Cervicalgia: Secondary | ICD-10-CM

## 2019-02-04 ENCOUNTER — Other Ambulatory Visit (HOSPITAL_COMMUNITY)
Admission: RE | Admit: 2019-02-04 | Discharge: 2019-02-04 | Disposition: A | Discharge status: Home / Self Care | Payer: Medicaid Other | Source: Ambulatory Visit | Attending: Anesthesiology | Admitting: Anesthesiology

## 2019-02-04 DIAGNOSIS — Z79899 Other long term (current) drug therapy: Secondary | ICD-10-CM | POA: Insufficient documentation

## 2019-02-04 DIAGNOSIS — E890 Postprocedural hypothyroidism: Secondary | ICD-10-CM | POA: Insufficient documentation

## 2019-02-04 DIAGNOSIS — I509 Heart failure, unspecified: Secondary | ICD-10-CM | POA: Diagnosis not present

## 2019-02-04 DIAGNOSIS — Z01812 Encounter for preprocedural laboratory examination: Secondary | ICD-10-CM | POA: Insufficient documentation

## 2019-02-04 DIAGNOSIS — G459 Transient cerebral ischemic attack, unspecified: Secondary | ICD-10-CM | POA: Insufficient documentation

## 2019-02-04 DIAGNOSIS — J449 Chronic obstructive pulmonary disease, unspecified: Secondary | ICD-10-CM | POA: Insufficient documentation

## 2019-02-04 DIAGNOSIS — K760 Fatty (change of) liver, not elsewhere classified: Secondary | ICD-10-CM | POA: Diagnosis not present

## 2019-02-04 DIAGNOSIS — Q211 Atrial septal defect: Secondary | ICD-10-CM | POA: Insufficient documentation

## 2019-02-04 DIAGNOSIS — M3500 Sicca syndrome, unspecified: Secondary | ICD-10-CM | POA: Diagnosis not present

## 2019-02-04 DIAGNOSIS — Z20828 Contact with and (suspected) exposure to other viral communicable diseases: Secondary | ICD-10-CM | POA: Insufficient documentation

## 2019-02-04 DIAGNOSIS — K219 Gastro-esophageal reflux disease without esophagitis: Secondary | ICD-10-CM | POA: Insufficient documentation

## 2019-02-04 DIAGNOSIS — K589 Irritable bowel syndrome without diarrhea: Secondary | ICD-10-CM | POA: Diagnosis not present

## 2019-02-04 DIAGNOSIS — H409 Unspecified glaucoma: Secondary | ICD-10-CM | POA: Diagnosis not present

## 2019-02-04 DIAGNOSIS — G4733 Obstructive sleep apnea (adult) (pediatric): Secondary | ICD-10-CM | POA: Insufficient documentation

## 2019-02-04 DIAGNOSIS — I110 Hypertensive heart disease with heart failure: Secondary | ICD-10-CM | POA: Insufficient documentation

## 2019-02-04 DIAGNOSIS — E1140 Type 2 diabetes mellitus with diabetic neuropathy, unspecified: Secondary | ICD-10-CM | POA: Insufficient documentation

## 2019-02-04 DIAGNOSIS — M797 Fibromyalgia: Secondary | ICD-10-CM | POA: Diagnosis not present

## 2019-02-04 DIAGNOSIS — E7800 Pure hypercholesterolemia, unspecified: Secondary | ICD-10-CM | POA: Diagnosis not present

## 2019-02-04 DIAGNOSIS — D573 Sickle-cell trait: Secondary | ICD-10-CM | POA: Insufficient documentation

## 2019-02-05 LAB — NOVEL CORONAVIRUS, NAA (HOSP ORDER, SEND-OUT TO REF LAB; TAT 18-24 HRS): SARS-CoV-2, NAA: NOT DETECTED

## 2019-02-06 ENCOUNTER — Encounter (HOSPITAL_COMMUNITY): Payer: Self-pay | Admitting: *Deleted

## 2019-02-06 ENCOUNTER — Other Ambulatory Visit: Payer: Self-pay

## 2019-02-06 NOTE — Progress Notes (Signed)
Anesthesia Chart Review: SAME DAY WORK-UP    Case: [id number] Date/Time: 02/07/19 0745   Procedure: MRI BRAIN WITHOUT IV CONTRAST; MRI CERVICAL, THORACIC, AND LUMBAR SPINE WITH AND WITHOUT CONTRAST (N/A )   Anesthesia type: General   Pre-op diagnosis: TIA, NECK PAIN, LOW BACK PAIN, THORACIC PAIN   Location: [facility] OR RADIOLOGY ROOM / [place] OR   Surgeon: Radiologist, Medication, MD      DISCUSSION: Patient is a 53 year old female scheduled for a MRI brain under anesthesia (due to claustrophobia) on 02/07/19 (postponed from 12/29/18 due to insurance reasons). Test was ordered by neurologist Alexis [name]. It appears Alexis Mccreedy, MD signed an updated H&P on 01/18/19 (scanned under Media tab, Radiology Order) and also one from Alexis [name] on 02/06/19. According to Alexis [name]' 11/23/18 note, she had known "nonorganic left-sided sensory alteration", but reported new left-sided numbness and paralysis. Brain MRI was ordered to further evaluate.      History includes never smoker, post-operative N/V, DM2, papillary thyroid cancer (s/p total thyroidectomy 0000000 complicated by left vocal cord paralysis; post-surgical hypothyroidism), tremor, fibromyalgia, Sjogren's syndrome, asthma, dyspnea, GERD, hiatal hernia, IBS, colitis, OSA, HTN, CHF (not-specified; reported prior to 2017), hypercholesterolemia, lymphedema, glaucoma, sickle cell trait, anemia, fatty liver, PTSD. She had two echocardiograms in 2017. TTE in March showed a small fenestrated of secundum ASD with "probable" bidirectional shunting, and TEE in May showed a possible small fenestrated pinhole with no atrial level shunt--there was no suggestion of intracardiac shunting by [place] 10/28/15. She did report occasional palpitations that can last up to a 1-2 minutes, but no chest pain and SOB.   Anesthesia concerns include post-operative N/V, "awaken" during anesthesia in the past, and history of left vocal cord paralysis.   COVID-19 test negative on 02/04/19. She  is for labs and EKG (if not received from PCP) on the day of her procedure. Anesthesia team to review and evaluate patient on the day of surgery. Current med list includes prednisone 20 mg every other day. She is also on Lyrica, oxycodone IR as needed, and Xtampza ER (oxycodone) 13.5 mg Q 12 hours.  Anesthesia team to evaluate on the day of her procedure.   VS: LMP  (Approximate) Comment: LMP November 2019   PROVIDERS: Alexis Mccreedy, MD is PCP  Kathrynn Ducking, MD is neurologist Iran Planas, MD is endocrinologist ([place]) - She is not routinely followed by a cardiologist, but was seen by Adrian Prows, MD in 2017 due to dyspnea and atypical chest pain. She had reported a history of CHF from years prior, but no real details known. She had a non-ischemic stress test, EF 46%, but EF 55% by echo. She had a RHC and TEE to assess for ASD and right heart pressures. Possible pinhole ASD seen with no shunt and normal RHC.    LABS: She is for labs on arrival.     EKG: Requested from PCP. If EKG within the past year not received then she will get updated EKG on arrival.   EKG 10/20/16:  Sinus rhythm Probable left atrial enlargement Abnormal R-wave progression, early transition Left ventricular hypertrophy Borderline abnrm T, anterolateral leads Confirmed by Nat Christen [phone]) on 10/22/2016 2:19:39 PM   CV: Carotid US 12/06/18: Summary: Right Carotid: Velocities in the right ICA are consistent with a 1-39% stenosis. Left Carotid: Velocities in the left ICA are consistent with a 1-39% stenosis. Vertebrals: Bilateral vertebral arteries demonstrate antegrade flow.   [place] 10/28/15: Conclusion: RA 7/4, mean 4  mmHg. RV [id number], end-diastolic pressure 6 to limit her mercury. PA 19/9, the 14 mmHg. PA saturation 78%. Pulmonary Artery wedge 11/9, mean 8 mmHg. Aortic saturation 100%. Cardiac output 7.11, cardiac index 3.65 by Fick. QT/QS 1.0. Impression: Normal right heart  catheterizaton with preserved cardiac output and cardiac index. No suggestion of intracardiac shunting with normal Qp:Qs ratio.   TEE 09/26/15: Study Conclusions - Left ventricle: Systolic function was normal. Wall motion was normal; there were no regional wall motion abnormalities. - Left atrium: No evidence of thrombus in the atrial cavity or appendage. - Right atrium: No evidence of thrombus in the atrial cavity or appendage. - Atrial septum: There was a possible, small fenestrated pinhole ASD by color Doppler only There was no atrial level shunt. - Impressions: Normal study. Impressions: - Normal study. Normal pulmonary artery pressure.   Lexiscan myoview stress test 07/22/15 [facility] CV):  Impression: 1. The resting electrocardiogram demonstrated normal sinus rhythm, normal resting conduction and no resting arrhythmias. Stress EKG is non-diagnostic for ischemia as it a pharmacologic stress using Lexiscan. Stress symptoms included dyspnea. 2. Myocardial perfusion imaging is normal. Overall left ventricular systolic function was normal without regional wall motion abnormalities. The left ventricular ejection fraction was visually normal but calculated at 46%. This is a low risk study.   Past Medical History:  Diagnosis Date  . 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 Anemia   . ASD (atrial septal defect)    per pt  . Asthma   . Bilateral dry eyes   . Cervical spondylosis    C5-6  . CHF (congestive heart failure) ([place])   . Chronic back pain   . Chronic bronchitis ([place])   . Chronic colitis   . Chronic lower GI bleeding   . Complication of anesthesia    has awakened in past/  LEFT VOCAL CORD PARALYSIS  POST TOTAL THYROIDECTOMY  03-08-2009 ([facility])  . Degenerative disk disease    "cervical to sacrum" (05/17/2017)  . Diabetes mellitus without complication ([place])   . Diabetic neuropathy, type II diabetes mellitus ([place]) 12/14/2014  . DJD  (degenerative joint disease) of knee   . Dyspnea   . Endometriosis 01/18/01  . Family history of adverse reaction to anesthesia    "mom wakes up during procedures"  . Fatty liver   . Fibromyalgia   . Gastric ulcer   . GERD (gastroesophageal reflux disease)   . Glaucoma of both eyes   . H/O hiatal hernia   . High cholesterol   . History of colon polyps   . Hypertension   . Hypothyroidism, postsurgical   . IBS (irritable bowel syndrome)   . IC (interstitial cystitis)   . Interstitial cystitis   . Lymphedema   . Migraine    "monthly" (05/17/2017)  02/06/2019- more frequently  . OSA on CPAP   . Osteoarthritis    "all over; mainly knees and back" (05/17/2017)  . PONV (postoperative nausea and vomiting)   . PTSD (post-traumatic stress disorder)   . Sickle cell trait ([place])   . Sjogren's syndrome ([place])   . Thyroid cancer ([place])   . Tremor 12/14/2014  . Uterine fibroid   . Varicose veins   . Vestibular dizziness   . Vocal cord paralysis, unilateral complete    "left";  POST TOTAL THYROIDECTOMY    Past Surgical History:  Procedure Laterality Date  . ACHILLES TENDON REPAIR Left 2007  . BUNIONECTOMY WITH HAMMERTOE RECONSTRUCTION AND GASTROC SLIDE Left  FEB  2011   REMOVAL HARDWARE  NOV  2011  . CARDIAC CATHETERIZATION N/A 10/28/2015   Procedure: Right Heart Cath;  Surgeon: Adrian Prows, MD;  Location: [facility];  Service: Cardiovascular;  Laterality: N/A;  . COLONOSCOPY WITH ESOPHAGOGASTRODUODENOSCOPY (EGD)  MULTIPLE --  LAST ONE   NOV  2014  . CYSTO/  BILATERAL RETROGRADE PYELOGRAM/  HYDRODISTENTION/  INSTILLATION THERAPY  05-08-2003  . CYSTO/  HYDRODISTENTION/  INSTILLATION THERAPY  06-04-2005  &  03-04-2007  . CYSTO/  URETHRAL DILATION /  BILATERAL UNROOFING URETEROCELE/  BILATERAL URETERAL STENT PLACEMENT  12-01-2002  . D & C HYSTEROSCOPY/  REMOVAL IUD  12-06-2003  . DE QUERVAIN'S RELEASE Left ?09/2005  . DIAGNOSTIC LAPAROSCOPY    . DILATATION AND CURETTAGE WITH SUCTION   05-29-2005   RETAINED PLACENTA  . DILATION AND CURETTAGE OF UTERUS    . DX LAPAROSCOPY/  PELVIC BX'S/  LYSIS ADHESIONS  06-26-2000  . FOOT HARDWARE REMOVAL Left 03/2010   REMOVAL HARDWARE "related to earlier [place] OR"  . HYDRADENITIS EXCISION  06/29/2011   Procedure: EXCISION HYDRADENITIS AXILLA;  Surgeon: Hermelinda Dellen, MD;  Location: [facility];  Service: Plastics;;  right breast hydradenitis excioion  . HYDRADENITIS EXCISION Left 2000  . INCISION AND DRAINAGE ABSCESS ANAL  02-14-2001  . KNEE ARTHROSCOPY WITH LATERAL MENISECTOMY Right 09/08/2013   Procedure: RIGHT KNEE ARTHROSCOPY PARTIAL LATERAL MENISCECTOMY AND DEBRIDEMENT ;  Surgeon: Johnn Hai, MD;  Location: [facility];  Service: Orthopedics;  Laterality: Right;  . LAPAROSCOPY ABDOMEN DIAGNOSTIC  SEPT  2003   [place]   AND PLACEMENT IUD  . MASS EXCISION  10/29/2003   WIDE EXCISION CHEST AREA  . PILONIDAL CYST EXCISION  1983  . REDUCTION MAMMAPLASTY  1997  . TEE WITHOUT CARDIOVERSION N/A 09/26/2015   Procedure: TRANSESOPHAGEAL ECHOCARDIOGRAM (TEE);  Surgeon: Adrian Prows, MD;  Location: [facility];  Service: Cardiovascular;  Laterality: N/A;  . TEMPORAL ARTERY BIOPSY / LIGATION Left 2010  . TEMPORAL ARTERY BIOPSY / LIGATION Right 06-16-2002  . TONSILLECTOMY  1990  . TOTAL THYROIDECTOMY  11/06/2008    MEDICATIONS: No current facility-administered medications for this encounter.    Marland Kitchen albuterol (PROVENTIL HFA;VENTOLIN HFA) 108 (90 BASE) MCG/ACT inhaler  . cetirizine (ZYRTEC) 10 MG chewable tablet  . clotrimazole (LOTRIMIN) 1 % cream  . diclofenac sodium (VOLTAREN) 1 % GEL  . dicyclomine (BENTYL) 20 MG tablet  . DULERA 200-5 MCG/ACT AERO  . hydrocortisone 2.5 % cream  . lidocaine (XYLOCAINE) 2 % solution  . Lidocaine 0.5 % GEL  . LYRICA 75 MG capsule  . Melatonin 10 MG CAPS  . omeprazole-sodium bicarbonate (ZEGERID) 40-1100 MG capsule  . ondansetron (ZOFRAN-ODT) 8 MG  disintegrating tablet  . oxycodone (ROXICODONE) 30 MG immediate release tablet  . predniSONE (DELTASONE) 20 MG tablet  . promethazine (PHENERGAN) 25 MG tablet  . propranolol (INDERAL) 10 MG tablet  . thyroid (ARMOUR) 120 MG tablet  . UNABLE TO FIND  . XTAMPZA ER 13.5 MG C12A  . ALPRAZolam (XANAX) 0.5 MG tablet  . hydrOXYzine (ATARAX/VISTARIL) 50 MG tablet  . solifenacin (VESICARE) 10 MG tablet  She is not currently taking Xanax.   Myra Gianotti, PA-C Surgical Short Stay/Anesthesiology [facility] Phone [phone] [facility] Phone [phone] 02/06/2019 4:11 PM

## 2019-02-06 NOTE — Progress Notes (Signed)
Ms Koler denies chest pain or shortness of breath. Ms Stallone tested negative for Covid 02/02/2018 and has been in quarantine since.  Ms Windish states that she does have palpations at times; it happens 2- 3 times a week and last 1- 2 minutes. "I can be sitting and not doing anything and it will just start beating fast- my phone has clocked it at 120." Patient reports that she sometimes gets short of breath when this happens. "Sometimes the rate is 55." I see in notes from [place] that patient reported the palpations to Raelyn Number, Caryl Pina notes say that patient is under the care of a cardiologist. Ms Seldin has seen Dr Einar Gip, but not since 2017. Patient reports that the palpations have been going on for months.  Ms Vanalstyne has type II diabetes, she is diet controlled. Ms Siebel reports that she is not required to check CBG, I encouraged patient to [facility] CBG after awaking and every 2 hours until arrival  to the hospital.  I Instructed patient if CBG is less than 70 to drink 1/2 cup of a clear juice. Recheck CBG in 15 minutes then call pre- op desk at [phone] for further instructions.  I will ask Myra Gianotti, PA- C to review information regarding palpations. I called Dr Jannifer Franklin' office and spoke to [facility] and asked for H/P that is ion 30 days of tomorrows date.

## 2019-02-07 ENCOUNTER — Ambulatory Visit (HOSPITAL_COMMUNITY): Payer: Medicaid Other

## 2019-02-07 ENCOUNTER — Ambulatory Visit (HOSPITAL_COMMUNITY): Payer: Medicaid Other | Admitting: Physician Assistant

## 2019-02-07 ENCOUNTER — Encounter (HOSPITAL_COMMUNITY): Admission: RE | Disposition: A | Discharge status: Home / Self Care | Payer: Self-pay | Source: Home / Self Care

## 2019-02-07 ENCOUNTER — Ambulatory Visit (HOSPITAL_COMMUNITY)
Admission: RE | Admit: 2019-02-07 | Discharge: 2019-02-07 | Disposition: A | Discharge status: Home / Self Care | Payer: Medicaid Other | Source: Ambulatory Visit | Attending: Neurology | Admitting: Neurology

## 2019-02-07 ENCOUNTER — Ambulatory Visit (HOSPITAL_COMMUNITY)
Admission: RE | Admit: 2019-02-07 | Discharge: 2019-02-07 | Disposition: A | Discharge status: Home / Self Care | Payer: Medicaid Other | Attending: Neurology | Admitting: Neurology

## 2019-02-07 ENCOUNTER — Ambulatory Visit (HOSPITAL_COMMUNITY): Admission: RE | Admit: 2019-02-07 | Payer: Medicaid Other | Source: Ambulatory Visit

## 2019-02-07 ENCOUNTER — Telehealth: Payer: Self-pay | Admitting: Neurology

## 2019-02-07 ENCOUNTER — Encounter (HOSPITAL_COMMUNITY): Payer: Self-pay | Admitting: Surgery

## 2019-02-07 ENCOUNTER — Encounter (HOSPITAL_COMMUNITY): Payer: Self-pay

## 2019-02-07 DIAGNOSIS — G4730 Sleep apnea, unspecified: Secondary | ICD-10-CM | POA: Insufficient documentation

## 2019-02-07 DIAGNOSIS — Z79890 Hormone replacement therapy (postmenopausal): Secondary | ICD-10-CM | POA: Insufficient documentation

## 2019-02-07 DIAGNOSIS — G459 Transient cerebral ischemic attack, unspecified: Secondary | ICD-10-CM

## 2019-02-07 DIAGNOSIS — Z01812 Encounter for preprocedural laboratory examination: Secondary | ICD-10-CM | POA: Diagnosis not present

## 2019-02-07 DIAGNOSIS — M2578 Osteophyte, vertebrae: Secondary | ICD-10-CM | POA: Insufficient documentation

## 2019-02-07 DIAGNOSIS — M5127 Other intervertebral disc displacement, lumbosacral region: Secondary | ICD-10-CM | POA: Insufficient documentation

## 2019-02-07 DIAGNOSIS — G8929 Other chronic pain: Secondary | ICD-10-CM | POA: Diagnosis not present

## 2019-02-07 DIAGNOSIS — F419 Anxiety disorder, unspecified: Secondary | ICD-10-CM | POA: Insufficient documentation

## 2019-02-07 DIAGNOSIS — M5134 Other intervertebral disc degeneration, thoracic region: Secondary | ICD-10-CM | POA: Diagnosis not present

## 2019-02-07 DIAGNOSIS — M545 Low back pain, unspecified: Secondary | ICD-10-CM

## 2019-02-07 DIAGNOSIS — I110 Hypertensive heart disease with heart failure: Secondary | ICD-10-CM | POA: Insufficient documentation

## 2019-02-07 DIAGNOSIS — M47812 Spondylosis without myelopathy or radiculopathy, cervical region: Secondary | ICD-10-CM | POA: Diagnosis not present

## 2019-02-07 DIAGNOSIS — Z79899 Other long term (current) drug therapy: Secondary | ICD-10-CM | POA: Diagnosis not present

## 2019-02-07 DIAGNOSIS — M50223 Other cervical disc displacement at C6-C7 level: Secondary | ICD-10-CM | POA: Diagnosis not present

## 2019-02-07 DIAGNOSIS — M48061 Spinal stenosis, lumbar region without neurogenic claudication: Secondary | ICD-10-CM | POA: Insufficient documentation

## 2019-02-07 DIAGNOSIS — E039 Hypothyroidism, unspecified: Secondary | ICD-10-CM | POA: Diagnosis not present

## 2019-02-07 DIAGNOSIS — K219 Gastro-esophageal reflux disease without esophagitis: Secondary | ICD-10-CM | POA: Diagnosis not present

## 2019-02-07 DIAGNOSIS — M546 Pain in thoracic spine: Secondary | ICD-10-CM

## 2019-02-07 DIAGNOSIS — M797 Fibromyalgia: Secondary | ICD-10-CM | POA: Insufficient documentation

## 2019-02-07 DIAGNOSIS — I509 Heart failure, unspecified: Secondary | ICD-10-CM | POA: Diagnosis not present

## 2019-02-07 DIAGNOSIS — E119 Type 2 diabetes mellitus without complications: Secondary | ICD-10-CM | POA: Diagnosis not present

## 2019-02-07 DIAGNOSIS — Z7952 Long term (current) use of systemic steroids: Secondary | ICD-10-CM | POA: Insufficient documentation

## 2019-02-07 DIAGNOSIS — M542 Cervicalgia: Secondary | ICD-10-CM

## 2019-02-07 HISTORY — DX: Other specified postprocedural states: Z98.890

## 2019-02-07 HISTORY — DX: Interstitial cystitis (chronic) without hematuria: N30.10

## 2019-02-07 HISTORY — PX: RADIOLOGY WITH ANESTHESIA: SHX6223

## 2019-02-07 HISTORY — DX: Fatty (change of) liver, not elsewhere classified: K76.0

## 2019-02-07 HISTORY — DX: Nausea with vomiting, unspecified: R11.2

## 2019-02-07 HISTORY — DX: Atrial septal defect: Q21.1

## 2019-02-07 HISTORY — DX: Type 2 diabetes mellitus without complications: E11.9

## 2019-02-07 HISTORY — DX: Post-traumatic stress disorder, unspecified: F43.10

## 2019-02-07 HISTORY — DX: Anemia, unspecified: D64.9

## 2019-02-07 HISTORY — DX: Atrial septal defect, unspecified: Q21.10

## 2019-02-07 LAB — COMPREHENSIVE METABOLIC PANEL
ALT: 11 U/L (ref 0–44)
AST: 10 U/L — ABNORMAL LOW (ref 15–41)
Albumin: 3.3 g/dL — ABNORMAL LOW (ref 3.5–5.0)
Alkaline Phosphatase: 57 U/L (ref 38–126)
Anion gap: 7 (ref 5–15)
BUN: 7 mg/dL (ref 6–20)
CO2: 30 mmol/L (ref 22–32)
Calcium: 8.4 mg/dL — ABNORMAL LOW (ref 8.9–10.3)
Chloride: 105 mmol/L (ref 98–111)
Creatinine, Ser: 0.87 mg/dL (ref 0.44–1.00)
GFR calc Af Amer: >60 mL/min (ref >60)
GFR calc non Af Amer: >60 mL/min (ref >60)
Glucose, Bld: 101 mg/dL — ABNORMAL HIGH (ref 70–99)
Potassium: 3.6 mmol/L (ref 3.5–5.1)
Sodium: 142 mmol/L (ref 135–145)
Total Bilirubin: 0.6 mg/dL (ref 0.3–1.2)
Total Protein: 5.8 g/dL — ABNORMAL LOW (ref 6.5–8.1)

## 2019-02-07 LAB — GLUCOSE, CAPILLARY
Glucose-Capillary: 96 mg/dL (ref 70–99)
Glucose-Capillary: 89 mg/dL (ref 70–99)

## 2019-02-07 LAB — CBC
HCT: 36 % (ref 36.0–46.0)
Hemoglobin: 11.5 g/dL — ABNORMAL LOW (ref 12.0–15.0)
MCH: 28.8 pg (ref 26.0–34.0)
MCHC: 31.9 g/dL (ref 30.0–36.0)
MCV: 90.2 fL (ref 80.0–100.0)
Platelets: 347 10*3/uL (ref 150–400)
RBC: 3.99 MIL/uL (ref 3.87–5.11)
RDW: 13.6 % (ref 11.5–15.5)
WBC: 12.8 10*3/uL — ABNORMAL HIGH (ref 4.0–10.5)
nRBC: 0 % (ref 0.0–0.2)

## 2019-02-07 LAB — POCT PREGNANCY, URINE: Preg Test, Ur: NEGATIVE

## 2019-02-07 SURGERY — MRI WITH ANESTHESIA
Anesthesia: General

## 2019-02-07 MED ORDER — FENTANYL CITRATE (PF) 100 MCG/2ML IJ SOLN: 25.0000 ug | INTRAMUSCULAR | Status: DC | PRN

## 2019-02-07 MED ORDER — PROMETHAZINE HCL 25 MG/ML IJ SOLN: 6.2500 mg | INTRAMUSCULAR | Status: DC | PRN

## 2019-02-07 MED ORDER — GADOBUTROL 1 MMOL/ML IV SOLN
10.0000 mL | Freq: Once | INTRAVENOUS | Status: AC | PRN
  Administered 2019-02-07: 10 mL via INTRAVENOUS

## 2019-02-07 MED ORDER — LACTATED RINGERS IV SOLN: INTRAVENOUS | Status: DC

## 2019-02-07 NOTE — Telephone Encounter (Signed)
I called the patient, MRI of the brain was normal the patient will undergo physical therapy.   MRI brain 02/07/19:  IMPRESSION: Normal examination. No abnormality seen to explain the presenting symptoms.

## 2019-02-07 NOTE — Anesthesia Postprocedure Evaluation (Signed)
Anesthesia Post Note  Patient: Alexis [name]  Procedure(s) Performed: MRI BRAIN WITHOUT IV CONTRAST; MRI CERVICAL, THORACIC, AND LUMBAR SPINE WITH AND WITHOUT CONTRAST (N/A )     Patient location during evaluation: PACU Anesthesia Type: General Level of consciousness: awake and alert Pain management: pain level controlled Vital Signs Assessment: post-procedure vital signs reviewed and stable Respiratory status: spontaneous breathing, nonlabored ventilation, respiratory function stable and patient connected to nasal cannula oxygen Cardiovascular status: blood pressure returned to baseline and stable Postop Assessment: no apparent nausea or vomiting Anesthetic complications: no    Last Vitals:  Vitals:   02/07/19 1039 02/07/19 1139  BP: 115/76 (!) 106/56  Pulse: 83 78  Resp: 19 18  Temp:    SpO2: 91% 99%    Last Pain:  Vitals:   02/07/19 1139  TempSrc:   PainSc: Asleep                 Catalina Gravel

## 2019-02-07 NOTE — Transfer of Care (Signed)
Immediate Anesthesia Transfer of Care Note  Patient: Alexis [name]  Procedure(s) Performed: MRI BRAIN WITHOUT IV CONTRAST; MRI CERVICAL, THORACIC, AND LUMBAR SPINE WITH AND WITHOUT CONTRAST (N/A )  Patient Location: PACU  Anesthesia Type:General  Level of Consciousness: awake, alert  and oriented  Airway & Oxygen Therapy: Patient Spontanous Breathing and Patient connected to nasal cannula oxygen  Post-op Assessment: Report given to RN, Post -op Vital signs reviewed and stable and Patient moving all extremities X 4  Post vital signs: Reviewed and stable  Last Vitals:  Vitals Value Taken Time  BP 120/75 02/07/19 1026  Temp 36.3 C 02/07/19 1024  Pulse 80 02/07/19 1032  Resp 10 02/07/19 1032  SpO2 100 % 02/07/19 1032  Vitals shown include unvalidated device data.  Last Pain:  Vitals:   02/07/19 1024  TempSrc:   PainSc: 5       Patients Stated Pain Goal: 5 (A999333 [id number])  Complications: No apparent anesthesia complications

## 2019-02-08 ENCOUNTER — Encounter (HOSPITAL_COMMUNITY): Payer: Self-pay | Admitting: Radiology

## 2019-02-08 MED FILL — Fentanyl Citrate Preservative Free (PF) Inj 100 MCG/2ML: INTRAMUSCULAR | Qty: 2 | Status: AC

## 2019-02-08 MED FILL — Midazolam HCl Inj 2 MG/2ML (Base Equivalent): INTRAMUSCULAR | Qty: 2 | Status: AC

## 2019-02-08 MED FILL — Lidocaine HCl Local Soln Prefilled Syringe 100 MG/5ML (2%): INTRAMUSCULAR | Qty: 5 | Status: AC

## 2019-02-08 MED FILL — Succinylcholine Chloride Sol Pref Syr 200 MG/10ML (20 MG/ML): INTRAVENOUS | Qty: 10 | Status: AC

## 2019-02-08 MED FILL — Propofol IV Emul 200 MG/20ML (10 MG/ML): INTRAVENOUS | Qty: 20 | Status: AC

## 2019-02-08 MED FILL — Phenylephrine-NaCl IV Solution 40 MG/250ML-0.9%: INTRAVENOUS | Qty: 250 | Status: AC

## 2019-02-08 MED FILL — Lactated Ringer's Solution: INTRAVENOUS | Qty: 1000 | Status: AC

## 2019-02-08 MED FILL — Phenylephrine-NaCl Pref Syr 0.4 MG/10ML-0.9% (40 MCG/ML): INTRAVENOUS | Qty: 10 | Status: AC

## 2019-03-28 ENCOUNTER — Ambulatory Visit: Payer: Medicaid Other | Admitting: Neurology

## 2019-12-01 ENCOUNTER — Other Ambulatory Visit: Payer: Self-pay | Admitting: Physician Assistant

## 2019-12-01 DIAGNOSIS — N644 Mastodynia: Secondary | ICD-10-CM

## 2019-12-19 ENCOUNTER — Other Ambulatory Visit: Payer: Medicaid Other

## 2020-01-02 ENCOUNTER — Other Ambulatory Visit: Payer: Self-pay

## 2020-01-02 ENCOUNTER — Ambulatory Visit
Admission: RE | Admit: 2020-01-02 | Discharge: 2020-01-02 | Disposition: A | Discharge status: Home / Self Care | Payer: Medicaid Other | Source: Ambulatory Visit | Attending: Physician Assistant | Admitting: Physician Assistant

## 2020-01-02 DIAGNOSIS — N644 Mastodynia: Secondary | ICD-10-CM

## 2020-01-04 ENCOUNTER — Ambulatory Visit: Payer: Self-pay

## 2020-01-19 ENCOUNTER — Other Ambulatory Visit: Payer: Self-pay | Admitting: Neurosurgery

## 2020-01-19 DIAGNOSIS — M5126 Other intervertebral disc displacement, lumbar region: Secondary | ICD-10-CM

## 2020-02-03 ENCOUNTER — Other Ambulatory Visit (HOSPITAL_COMMUNITY)
Admission: RE | Admit: 2020-02-03 | Discharge: 2020-02-03 | Disposition: A | Discharge status: Home / Self Care | Payer: Medicaid Other | Source: Ambulatory Visit | Attending: Neurosurgery | Admitting: Neurosurgery

## 2020-02-03 DIAGNOSIS — Z01812 Encounter for preprocedural laboratory examination: Secondary | ICD-10-CM | POA: Diagnosis present

## 2020-02-03 DIAGNOSIS — Z20822 Contact with and (suspected) exposure to covid-19: Secondary | ICD-10-CM | POA: Insufficient documentation

## 2020-02-03 LAB — SARS CORONAVIRUS 2 (TAT 6-24 HRS): SARS Coronavirus 2: NEGATIVE

## 2020-02-05 ENCOUNTER — Other Ambulatory Visit: Payer: Self-pay

## 2020-02-05 ENCOUNTER — Encounter (HOSPITAL_COMMUNITY): Payer: Self-pay | Admitting: Certified Registered"

## 2020-02-05 ENCOUNTER — Encounter (HOSPITAL_COMMUNITY): Payer: Self-pay | Admitting: *Deleted

## 2020-02-05 NOTE — Progress Notes (Signed)
Called patient, surgery was cancelled due to  no H&P with in 30 days of surgery.  Patient was offered to have MRI without anesthesia but patient declined.

## 2020-02-05 NOTE — Progress Notes (Signed)
PCP - [facility] P[facility] - Sees [facility] PA Cardiologist - denies    Chest x-ray - n/a EKG - DOS Stress Test - denies ECHO - denies Cardiac Cath - denies  Sleep Study - yes  CPAP - no cant afford new one the one she has is broken Doesn't check sugars  COVID TEST- 02/03/20 negative  Patient is homeless lost her home of 12 years back in March, patient stated that she has a very hard time getting her medications due to lack of financial stability.  Patient stated that she has been staying at hotels but they have now ran out of money and the shelters are full   Anesthesia review: yes  Patient denies shortness of breath, fever, cough and chest pain at PAT appointment   All instructions explained to the patient, with a verbal understanding of the material. Patient agrees to go over the instructions while at home for a better understanding. Patient also instructed to self quarantine after being tested for COVID-19. The opportunity to ask questions was provided.

## 2020-02-05 NOTE — Progress Notes (Signed)
Dr. Alphonzo Dublin office contacted for updated H&P.  Spoke to [facility] who transferred me to Dr. Alphonzo Dublin secretary.  I had to leave a message for her.    Called [place] PA with anesthesia to explain that the patient needs an H&P before tomorrow.    Made Jovita Kussmaul, RN [facility] Flow Coordinator aware of missing H&P

## 2020-02-05 NOTE — Progress Notes (Signed)
Anesthesia Chart Review: Alexis [name]   Case: [id number] Date/Time: 02/06/20 0945   Procedure: MRI WITH ANESTHESIA LUMBAR SPINE WITHOUT CONTRAST (N/A )   Anesthesia type: General   Pre-op diagnosis: DISC DISPLACEMENT LUMBAR REGION   Location: [facility] OR RADIOLOGY ROOM / [place] [facility]   Surgeons: Radiologist, Medication, MD      DISCUSSION: Patient is a 54 year old female scheduled for a MRI L-spine under anesthesia. MRI was ordered by neurosurgeon Ashok Pall, MD. Awaiting H&P, message x2 left with Caren Griffins at Dr. Lacy Duverney office.   History includes never smoker, post-operative N/V, DM2, papillary thyroid cancer (s/p total thyroidectomy 8/41/32 complicated by left vocal cord paralysis; post-surgical hypothyroidism), tremor, fibromyalgia, Sjogren's syndrome, asthma, dyspnea, GERD, hiatal hernia, IBS, colitis, OSA, HTN, CHF (not-specified; reported prior to 2017), hypercholesterolemia, lymphedema, glaucoma, sickle cell trait, anemia, fatty liver, PTSD. She had two echocardiograms in 2017. TTE in March 2017 showed a small fenestrated of secundum ASD with "probable" bidirectional shunting, and TEE in May 2017 showed a possible small fenestrated pinhole with no atrial level shunt--there was no suggestion of intracardiac shunting by [place] 10/28/15.   Anesthesia concerns include post-operative N/V, "awaken" during anesthesia in the past, and history of left vocal cord paralysis.   She is a same day work-up. COVID-19 test negative on 9/18//21. Last known EKG > 1 year ago. Awaiting H&P. Anesthesia team to review and evaluate patient on the day of surgery. Current med list includes prednisone 20 mg daily.    VS: For day of procedure.   PROVIDERS: Benito Mccreedy, MD is PCP  Kathrynn Ducking, MD is neurologist Iran Planas, MD is endocrinologist ([place]) - She is not routinely followed by a cardiologist, but was seen by Adrian Prows, MD in 2017 due to dyspnea and atypical chest pain. She had  reported a history of CHF from years prior, but no real details known. She had a non-ischemic stress test, EF 46%, but EF 55% by echo. She had a RHC and TEE to assess for ASD and right heart pressures. Possible pinhole ASD seen with no shunt on TEE and no shunt on RHC.      LABS: For day of procedure as indicated.    EKG: Last EKG seen is from 02/07/19.    CV: Echo 04/01/18 ([facility]): Conclusions: 1.  Mild concentric LVH with normal LV internal dimension and wall motion.  LVEF 69%. 2.  The right ventricle is normal in size and contractility. 3.  There is no evidence of left ventricular diastolic dysfunction. 4.  The right atrium is normal in size.  The left atrium is normal. 5.  The aortic root is normal in size.  The aortic valve is mildly calcified. 6.  The tricuspid, pulmonic, aortic and mitral valves are morphologically normal and open adequately.  The mitral valve is mildly thickened. 7.  The intra-atrial and intraventricular septum are intact. 8.  Color-flow, pulse and continuous wave Doppler interrogation reveal (report ends at this point and is signed by Sherol Dade, MD, [facility])   Carotid US 12/06/18: Summary: Right Carotid: Velocities in the right ICA are consistent with a 1-39% stenosis. Left Carotid: Velocities in the left ICA are consistent with a 1-39% stenosis. Vertebrals: Bilateral vertebral arteries demonstrate antegrade flow.   [place] 10/28/15: Conclusion: RA 7/4, mean 4 mmHg. RV 44/0, end-diastolic pressure 6 to limit her mercury. PA 19/9, the 14 mmHg. PA saturation 78%. Pulmonary Artery wedge 11/9, mean 8 mmHg. Aortic saturation 100%. Cardiac  output 7.11, cardiac index 3.65 by Fick. QT/QS 1.0. Impression: Normal right heart catheterizaton with preserved cardiac output and cardiac index. No suggestion of intracardiac shunting with normal Qp:Qs ratio.   Lexiscan myoview stress test 07/22/15 [facility] CV):  Impression: 1. The resting electrocardiogram  demonstrated normal sinus rhythm, normal resting conduction and no resting arrhythmias. Stress EKG is non-diagnostic for ischemia as it a pharmacologic stress using Lexiscan. Stress symptoms included dyspnea. 2. Myocardial perfusion imaging is normal. Overall left ventricular systolic function was normal without regional wall motion abnormalities. The left ventricular ejection fraction was visually normal but calculated at 46%. This is a low risk study.   Past Medical History:  Diagnosis Date  . 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 Anemia   . ASD (atrial septal defect)    per pt  . Asthma   . Bilateral dry eyes   . Cervical spondylosis    C5-6  . CHF (congestive heart failure) ([place])   . Chronic back pain   . Chronic bronchitis ([place])   . Chronic colitis   . Chronic lower GI bleeding   . Complication of anesthesia    has awakened in past/  LEFT VOCAL CORD PARALYSIS  POST TOTAL THYROIDECTOMY  03-08-2009 ([facility])  . Degenerative disk disease    "cervical to sacrum" (05/17/2017)  . Diabetes mellitus without complication ([place])   . Diabetic neuropathy, type II diabetes mellitus ([place]) 12/14/2014  . DJD (degenerative joint disease) of knee   . Dyspnea   . Endometriosis 01/18/01  . Family history of adverse reaction to anesthesia    "mom wakes up during procedures"  . Fatty liver   . Fibromyalgia   . Gastric ulcer   . GERD (gastroesophageal reflux disease)   . Glaucoma of both eyes   . H/O hiatal hernia   . High cholesterol   . History of colon polyps   . Hypertension   . Hypothyroidism, postsurgical   . IBS (irritable bowel syndrome)   . IC (interstitial cystitis)   . Interstitial cystitis   . Lymphedema   . Migraine    "monthly" (05/17/2017)  02/06/2019- more frequently  . OSA on CPAP   . Osteoarthritis    "all over; mainly knees and back" (05/17/2017)  . PONV (postoperative nausea and vomiting)   . PTSD (post-traumatic stress disorder)   . Sickle cell  trait ([place])   . Sjogren's syndrome ([place])   . Thyroid cancer ([place])   . Tremor 12/14/2014  . Uterine fibroid   . Varicose veins   . Vestibular dizziness   . Vocal cord paralysis, unilateral complete    "left";  POST TOTAL THYROIDECTOMY    Past Surgical History:  Procedure Laterality Date  . ACHILLES TENDON REPAIR Left 2007  . BUNIONECTOMY WITH HAMMERTOE RECONSTRUCTION AND GASTROC SLIDE Left FEB  2011   REMOVAL HARDWARE  NOV  2011  . CARDIAC CATHETERIZATION N/A 10/28/2015   Procedure: Right Heart Cath;  Surgeon: Adrian Prows, MD;  Location: [place] CV LAB;  Service: Cardiovascular;  Laterality: N/A;  . COLONOSCOPY WITH ESOPHAGOGASTRODUODENOSCOPY (EGD)  MULTIPLE --  LAST ONE   NOV  2014  . CYSTO/  BILATERAL RETROGRADE PYELOGRAM/  HYDRODISTENTION/  INSTILLATION THERAPY  05-08-2003  . CYSTO/  HYDRODISTENTION/  INSTILLATION THERAPY  06-04-2005  &  03-04-2007  . CYSTO/  URETHRAL DILATION /  BILATERAL UNROOFING URETEROCELE/  BILATERAL URETERAL STENT PLACEMENT  12-01-2002  . D & C HYSTEROSCOPY/  REMOVAL IUD  12-06-2003  . DE QUERVAIN'S RELEASE Left ?09/2005  . DIAGNOSTIC LAPAROSCOPY    . DILATATION AND CURETTAGE WITH SUCTION  05-29-2005   RETAINED PLACENTA  . DILATION AND CURETTAGE OF UTERUS    . DX LAPAROSCOPY/  PELVIC BX'S/  LYSIS ADHESIONS  06-26-2000  . FOOT HARDWARE REMOVAL Left 03/2010   REMOVAL HARDWARE "related to earlier [facility] OR"  . HYDRADENITIS EXCISION  06/29/2011   Procedure: EXCISION HYDRADENITIS AXILLA;  Surgeon: Hermelinda Dellen, MD;  Location: [facility];  Service: Plastics;;  right breast hydradenitis excioion  . HYDRADENITIS EXCISION Left 2000  . INCISION AND DRAINAGE ABSCESS ANAL  02-14-2001  . KNEE ARTHROSCOPY WITH LATERAL MENISECTOMY Right 09/08/2013   Procedure: RIGHT KNEE ARTHROSCOPY PARTIAL LATERAL MENISCECTOMY AND DEBRIDEMENT ;  Surgeon: Johnn Hai, MD;  Location: [place];  Service: Orthopedics;  Laterality: Right;  .  LAPAROSCOPY ABDOMEN DIAGNOSTIC  SEPT  2003   [place]   AND PLACEMENT IUD  . MASS EXCISION  10/29/2003   WIDE EXCISION CHEST AREA  . PILONIDAL CYST EXCISION  1983  . RADIOLOGY WITH ANESTHESIA N/A 02/07/2019   Procedure: MRI BRAIN WITHOUT IV CONTRAST; MRI CERVICAL, THORACIC, AND LUMBAR SPINE WITH AND WITHOUT CONTRAST;  Surgeon: Radiologist, Medication, MD;  Location: [place];  Service: Radiology;  Laterality: N/A;  . REDUCTION MAMMAPLASTY  1997  . TEE WITHOUT CARDIOVERSION N/A 09/26/2015   Procedure: TRANSESOPHAGEAL ECHOCARDIOGRAM (TEE);  Surgeon: Adrian Prows, MD;  Location: [place];  Service: Cardiovascular;  Laterality: N/A;  . TEMPORAL ARTERY BIOPSY / LIGATION Left 2010  . TEMPORAL ARTERY BIOPSY / LIGATION Right 06-16-2002  . TONSILLECTOMY  1990  . TOTAL THYROIDECTOMY  11/06/2008    MEDICATIONS: No current facility-administered medications for this encounter.   Marland Kitchen acetaminophen (TYLENOL) 500 MG tablet  . albuterol (PROVENTIL HFA;VENTOLIN HFA) 108 (90 BASE) MCG/ACT inhaler  . atorvastatin (LIPITOR) 20 MG tablet  . cetirizine (ZYRTEC) 10 MG chewable tablet  . diclofenac sodium (VOLTAREN) 1 % GEL  . Diclofenac Sodium 3 % GEL  . DULERA 200-5 MCG/ACT AERO  . DULoxetine (CYMBALTA) 30 MG capsule  . hydrOXYzine (ATARAX/VISTARIL) 50 MG tablet  . Lidocaine 0.5 % GEL  . LYRICA 75 MG capsule  . Melatonin 10 MG CAPS  . naproxen sodium (ALEVE) 220 MG tablet  . omeprazole-sodium bicarbonate (ZEGERID) 40-1100 MG capsule  . ondansetron (ZOFRAN-ODT) 8 MG disintegrating tablet  . oxycodone (ROXICODONE) 30 MG immediate release tablet  . predniSONE (DELTASONE) 20 MG tablet  . promethazine (PHENERGAN) 25 MG tablet  . propranolol (INDERAL) 10 MG tablet  . solifenacin (VESICARE) 10 MG tablet  . thyroid (ARMOUR) 120 MG tablet  . tiZANidine (ZANAFLEX) 4 MG tablet  . torsemide (DEMADEX) 20 MG tablet  . Vitamin D, Ergocalciferol, (DRISDOL) 1.25 MG (50000 UNIT) CAPS capsule    Myra Gianotti,  PA-C Surgical Short Stay/Anesthesiology [facility] Phone [phone] [facility] Phone [phone] 02/05/2020 2:01 PM

## 2020-02-05 NOTE — Anesthesia Preprocedure Evaluation (Deleted)
Anesthesia Evaluation    Airway        Dental   Pulmonary           Cardiovascular hypertension,      Neuro/Psych    GI/Hepatic   Endo/Other  diabetes  Renal/GU      Musculoskeletal   Abdominal   Peds  Hematology   Anesthesia Other Findings   Reproductive/Obstetrics                             Anesthesia Physical Anesthesia Plan  ASA:   Anesthesia Plan:    Post-op Pain Management:    Induction:   PONV Risk Score and Plan:   Airway Management Planned:   Additional Equipment:   Intra-op Plan:   Post-operative Plan:   Informed Consent:   Plan Discussed with:   Anesthesia Plan Comments: (PAT note written 02/05/2020 by Myra Gianotti, PA-C. Dr. Lacy Duverney office contacted regarding need for H&P. )        Anesthesia Quick Evaluation

## 2020-02-06 ENCOUNTER — Ambulatory Visit (HOSPITAL_COMMUNITY): Admission: RE | Admit: 2020-02-06 | Payer: Medicaid Other | Source: Ambulatory Visit

## 2020-02-06 ENCOUNTER — Ambulatory Visit: Admit: 2020-02-06 | Payer: Medicaid Other

## 2020-02-06 SURGERY — MRI WITH ANESTHESIA
Anesthesia: General

## 2020-02-07 ENCOUNTER — Telehealth (HOSPITAL_COMMUNITY): Payer: Medicaid Other

## 2020-02-10 ENCOUNTER — Other Ambulatory Visit: Payer: Self-pay

## 2020-02-10 ENCOUNTER — Emergency Department (HOSPITAL_COMMUNITY): Payer: Medicaid Other

## 2020-02-10 ENCOUNTER — Encounter (HOSPITAL_COMMUNITY): Payer: Self-pay | Admitting: Emergency Medicine

## 2020-02-10 ENCOUNTER — Emergency Department (HOSPITAL_COMMUNITY)
Admission: EM | Admit: 2020-02-10 | Discharge: 2020-02-10 | Disposition: A | Discharge status: Home / Self Care | Payer: Medicaid Other | Attending: Emergency Medicine | Admitting: Emergency Medicine

## 2020-02-10 DIAGNOSIS — Z91040 Latex allergy status: Secondary | ICD-10-CM | POA: Diagnosis not present

## 2020-02-10 DIAGNOSIS — R519 Headache, unspecified: Secondary | ICD-10-CM | POA: Diagnosis present

## 2020-02-10 DIAGNOSIS — Z79890 Hormone replacement therapy (postmenopausal): Secondary | ICD-10-CM | POA: Diagnosis not present

## 2020-02-10 DIAGNOSIS — E1140 Type 2 diabetes mellitus with diabetic neuropathy, unspecified: Secondary | ICD-10-CM | POA: Insufficient documentation

## 2020-02-10 DIAGNOSIS — I110 Hypertensive heart disease with heart failure: Secondary | ICD-10-CM | POA: Insufficient documentation

## 2020-02-10 DIAGNOSIS — I509 Heart failure, unspecified: Secondary | ICD-10-CM | POA: Diagnosis not present

## 2020-02-10 DIAGNOSIS — J45909 Unspecified asthma, uncomplicated: Secondary | ICD-10-CM | POA: Insufficient documentation

## 2020-02-10 DIAGNOSIS — Z79899 Other long term (current) drug therapy: Secondary | ICD-10-CM | POA: Insufficient documentation

## 2020-02-10 DIAGNOSIS — E039 Hypothyroidism, unspecified: Secondary | ICD-10-CM | POA: Insufficient documentation

## 2020-02-10 DIAGNOSIS — I10 Essential (primary) hypertension: Secondary | ICD-10-CM

## 2020-02-10 LAB — COMPREHENSIVE METABOLIC PANEL
ALT: 38 U/L (ref 0–44)
AST: 26 U/L (ref 15–41)
Albumin: 4.1 g/dL (ref 3.5–5.0)
Alkaline Phosphatase: 121 U/L (ref 38–126)
Anion gap: 11 (ref 5–15)
BUN: 8 mg/dL (ref 6–20)
CO2: 31 mmol/L (ref 22–32)
Calcium: 9.8 mg/dL (ref 8.9–10.3)
Chloride: 102 mmol/L (ref 98–111)
Creatinine, Ser: 0.82 mg/dL (ref 0.44–1.00)
GFR calc Af Amer: >60 mL/min (ref >60)
GFR calc non Af Amer: >60 mL/min (ref >60)
Glucose, Bld: 113 mg/dL — ABNORMAL HIGH (ref 70–99)
Potassium: 4.5 mmol/L (ref 3.5–5.1)
Sodium: 144 mmol/L (ref 135–145)
Total Bilirubin: 0.5 mg/dL (ref 0.3–1.2)
Total Protein: 8.3 g/dL — ABNORMAL HIGH (ref 6.5–8.1)

## 2020-02-10 LAB — CBC WITH DIFFERENTIAL/PLATELET
Abs Immature Granulocytes: 0.09 10*3/uL — ABNORMAL HIGH (ref 0.00–0.07)
Basophils Absolute: 0.1 10*3/uL (ref 0.0–0.1)
Basophils Relative: 0 %
Eosinophils Absolute: 0 10*3/uL (ref 0.0–0.5)
Eosinophils Relative: 0 %
HCT: 42.6 % (ref 36.0–46.0)
Hemoglobin: 14.2 g/dL (ref 12.0–15.0)
Immature Granulocytes: 1 %
Lymphocytes Relative: 5 %
Lymphs Abs: 0.9 10*3/uL (ref 0.7–4.0)
MCH: 28.9 pg (ref 26.0–34.0)
MCHC: 33.3 g/dL (ref 30.0–36.0)
MCV: 86.8 fL (ref 80.0–100.0)
Monocytes Absolute: 0.3 10*3/uL (ref 0.1–1.0)
Monocytes Relative: 2 %
Neutro Abs: 15.8 10*3/uL — ABNORMAL HIGH (ref 1.7–7.7)
Neutrophils Relative %: 92 %
Platelets: 426 10*3/uL — ABNORMAL HIGH (ref 150–400)
RBC: 4.91 MIL/uL (ref 3.87–5.11)
RDW: 13.2 % (ref 11.5–15.5)
WBC: 17.2 10*3/uL — ABNORMAL HIGH (ref 4.0–10.5)
nRBC: 0 % (ref 0.0–0.2)

## 2020-02-10 MED ORDER — METOPROLOL SUCCINATE ER 25 MG PO TB24: 25.0000 mg | ORAL_TABLET | Freq: Every day | ORAL | 1 refills | Status: DC

## 2020-02-10 MED ORDER — HYDROCODONE-ACETAMINOPHEN 5-325 MG PO TABS: 1.0000 | ORAL_TABLET | Freq: Four times a day (QID) | ORAL | 0 refills | Status: DC | PRN

## 2020-02-10 MED ORDER — HYDRALAZINE HCL 20 MG/ML IJ SOLN
5.0000 mg | Freq: Once | INTRAMUSCULAR | Status: AC
  Administered 2020-02-10: 5 mg via INTRAVENOUS
  Filled 2020-02-10: qty 1

## 2020-02-10 MED ORDER — ACETAMINOPHEN 325 MG PO TABS
650.0000 mg | ORAL_TABLET | Freq: Once | ORAL | Status: AC
  Administered 2020-02-10: 650 mg via ORAL
  Filled 2020-02-10: qty 2

## 2020-02-10 MED ORDER — HYDRALAZINE HCL 20 MG/ML IJ SOLN
10.0000 mg | Freq: Once | INTRAMUSCULAR | Status: AC
  Administered 2020-02-10: 10 mg via INTRAVENOUS
  Filled 2020-02-10: qty 1

## 2020-02-10 MED ORDER — LISINOPRIL 20 MG PO TABS: 20.0000 mg | ORAL_TABLET | Freq: Every day | ORAL | 0 refills | Status: DC

## 2020-02-10 MED ORDER — MORPHINE SULFATE (PF) 4 MG/ML IV SOLN
4.0000 mg | Freq: Once | INTRAVENOUS | Status: AC
  Administered 2020-02-10: 4 mg via INTRAVENOUS
  Filled 2020-02-10: qty 1

## 2020-02-10 NOTE — Discharge Instructions (Addendum)
Follow-up next week for recheck your blood pressure.

## 2020-02-10 NOTE — ED Provider Notes (Signed)
[facility] DEPT Provider Note   CSN: [id number] Arrival date & time: 02/10/20  1404     History Chief Complaint  Patient presents with  . Headache    Alexis [name] is a 54 y.o. female.  The history is provided by the patient. No language interpreter was used.  Headache Pain location:  R temporal Quality:  Stabbing Radiates to:  Does not radiate Onset quality:  Gradual Duration:  1 day Timing:  Constant Chronicity:  New Similar to prior headaches: no   Relieved by:  Nothing Worsened by:  Nothing Associated symptoms: neck stiffness   Associated symptoms: no blurred vision   Pt is currently homeless.  Pt reports she is not on any medication for her blood pressure.  Pt complains of a headache and soreness in her neck.  Pt reports her MD stopped her blood pressure medications.      Past Medical History:  Diagnosis Date  . 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 Anemia   . ASD (atrial septal defect)    per pt  . Asthma   . Bilateral dry eyes   . Cervical spondylosis    C5-6  . CHF (congestive heart failure) ([place])    not sure  . Chronic back pain   . Chronic bronchitis ([place])   . Chronic colitis   . Chronic lower GI bleeding   . Complication of anesthesia    has awakened in past/  LEFT VOCAL CORD PARALYSIS  POST TOTAL THYROIDECTOMY  03-08-2009 ([facility])  . Degenerative disk disease    "cervical to sacrum" (05/17/2017)  . Diabetes mellitus without complication ([place])   . Diabetic neuropathy, type II diabetes mellitus ([place]) 12/14/2014  . DJD (degenerative joint disease) of knee   . Dyspnea   . Endometriosis 01/18/01  . Family history of adverse reaction to anesthesia    "mom wakes up during procedures"  . Fatty liver   . Fibromyalgia   . Gastric ulcer   . GERD (gastroesophageal reflux disease)   . Glaucoma of both eyes   . H/O hiatal hernia   . High cholesterol   . History of colon polyps   . Hypertension     . Hypothyroidism, postsurgical   . IBS (irritable bowel syndrome)   . IC (interstitial cystitis)   . Interstitial cystitis   . Lymphedema   . Migraine    "monthly" (05/17/2017)  02/06/2019- more frequently  . OSA on CPAP   . Osteoarthritis    "all over; mainly knees and back" (05/17/2017)  . PONV (postoperative nausea and vomiting)   . PTSD (post-traumatic stress disorder)   . Sickle cell trait ([place])   . Sjogren's syndrome ([place])   . Thyroid cancer ([place])   . Tremor 12/14/2014   hand  . Uterine fibroid   . Varicose veins   . Vestibular dizziness   . Vocal cord paralysis, unilateral complete    "left";  POST TOTAL THYROIDECTOMY    Patient Active Problem List   Diagnosis Date Noted  . Colitis with rectal bleeding 05/16/2017  . Chronic pain   . Nausea and vomiting   . Fibromyalgia 02/21/2016  . Tremor 12/14/2014  . Diabetic neuropathy, type II diabetes mellitus ([place]) 12/14/2014  . Varicose veins of lower extremities with other complications 78/29/5621  . Leg swelling 07/25/2012  . SOB (shortness of breath) 02/22/2012  . Chronic pelvic pain in female 06/26/2000    Past Surgical History:  Procedure Laterality Date  . ACHILLES TENDON REPAIR Left 2007  . BUNIONECTOMY WITH HAMMERTOE RECONSTRUCTION AND GASTROC SLIDE Left FEB  2011   REMOVAL HARDWARE  NOV  2011  . CARDIAC CATHETERIZATION N/A 10/28/2015   Procedure: Right Heart Cath;  Surgeon: Adrian Prows, MD;  Location: [place] CV LAB;  Service: Cardiovascular;  Laterality: N/A;  . COLONOSCOPY WITH ESOPHAGOGASTRODUODENOSCOPY (EGD)  MULTIPLE --  LAST ONE   NOV  2014  . CYSTO/  BILATERAL RETROGRADE PYELOGRAM/  HYDRODISTENTION/  INSTILLATION THERAPY  05-08-2003  . CYSTO/  HYDRODISTENTION/  INSTILLATION THERAPY  06-04-2005  &  03-04-2007  . CYSTO/  URETHRAL DILATION /  BILATERAL UNROOFING URETEROCELE/  BILATERAL URETERAL STENT PLACEMENT  12-01-2002  . D & C HYSTEROSCOPY/  REMOVAL IUD  12-06-2003  . DE QUERVAIN'S RELEASE Left  ?09/2005  . DIAGNOSTIC LAPAROSCOPY    . DILATATION AND CURETTAGE WITH SUCTION  05-29-2005   RETAINED PLACENTA  . DILATION AND CURETTAGE OF UTERUS    . DX LAPAROSCOPY/  PELVIC BX'S/  LYSIS ADHESIONS  06-26-2000  . FOOT HARDWARE REMOVAL Left 03/2010   REMOVAL HARDWARE "related to earlier [facility] OR"  . HYDRADENITIS EXCISION  06/29/2011   Procedure: EXCISION HYDRADENITIS AXILLA;  Surgeon: Hermelinda Dellen, MD;  Location: [place];  Service: Plastics;;  right breast hydradenitis excioion  . HYDRADENITIS EXCISION Left 2000  . INCISION AND DRAINAGE ABSCESS ANAL  02-14-2001  . KNEE ARTHROSCOPY WITH LATERAL MENISECTOMY Right 09/08/2013   Procedure: RIGHT KNEE ARTHROSCOPY PARTIAL LATERAL MENISCECTOMY AND DEBRIDEMENT ;  Surgeon: Johnn Hai, MD;  Location: [place];  Service: Orthopedics;  Laterality: Right;  . LAPAROSCOPY ABDOMEN DIAGNOSTIC  SEPT  2003   [place]   AND PLACEMENT IUD  . MASS EXCISION  10/29/2003   WIDE EXCISION CHEST AREA  . PILONIDAL CYST EXCISION  1983  . RADIOLOGY WITH ANESTHESIA N/A 02/07/2019   Procedure: MRI BRAIN WITHOUT IV CONTRAST; MRI CERVICAL, THORACIC, AND LUMBAR SPINE WITH AND WITHOUT CONTRAST;  Surgeon: Radiologist, Medication, MD;  Location: [place];  Service: Radiology;  Laterality: N/A;  . REDUCTION MAMMAPLASTY  1997  . TEE WITHOUT CARDIOVERSION N/A 09/26/2015   Procedure: TRANSESOPHAGEAL ECHOCARDIOGRAM (TEE);  Surgeon: Adrian Prows, MD;  Location: [place];  Service: Cardiovascular;  Laterality: N/A;  . TEMPORAL ARTERY BIOPSY / LIGATION Left 2010  . TEMPORAL ARTERY BIOPSY / LIGATION Right 06-16-2002  . TONSILLECTOMY  1990  . TOTAL THYROIDECTOMY  11/06/2008     OB History    Gravida  1   Para  1   Term      Preterm      AB      Living  1     SAB      TAB      Ectopic      Multiple      Live Births  1           Family History  Problem Relation Age of Onset  . Atrial fibrillation Mother   .  Diabetes Mother   . Thyroid disease Mother   . Cushing syndrome Mother   . Tremor Mother   . Atrial fibrillation Father   . Glaucoma Father   . Tremor Sister   . Cancer Paternal Aunt        breast, colon    Social History   Tobacco Use  . Smoking status: Never Smoker  . Smokeless tobacco: Never Used  Vaping Use  . Vaping  Use: Never used  Substance Use Topics  . Alcohol use: Not Currently    Comment: rare  . Drug use: No    Home Medications Prior to Admission medications   Medication Sig Start Date End Date Taking? Authorizing Provider  acetaminophen (TYLENOL) 500 MG tablet Take 1,000 mg by mouth every 6 (six) hours as needed for headache.    [provider]  albuterol (PROVENTIL HFA;VENTOLIN HFA) 108 (90 BASE) MCG/ACT inhaler Inhale 2 puffs into the lungs every 4 (four) hours as needed for wheezing or shortness of breath.     [provider]  atorvastatin (LIPITOR) 20 MG tablet Take 20 mg by mouth daily.    [provider]  cetirizine (ZYRTEC) 10 MG chewable tablet Chew 10 mg by mouth daily.    [provider]  diclofenac sodium (VOLTAREN) 1 % GEL Apply 4 g topically 4 (four) times daily as needed (pain.).  11/11/18   [provider]  Diclofenac Sodium 3 % GEL Apply 1 application topically 4 (four) times daily as needed. 01/24/20   [provider]  DULERA 200-5 MCG/ACT AERO Inhale 2 puffs into the lungs 2 (two) times daily. 05/03/17   [provider]  DULoxetine (CYMBALTA) 30 MG capsule Take 60 mg by mouth at bedtime. 01/24/20   [provider]  hydrOXYzine (ATARAX/VISTARIL) 50 MG tablet Take 50 mg by mouth 4 (four) times daily. 09/15/16   [provider]  Lidocaine 0.5 % GEL Apply 1 application topically 3 (three) times daily as needed.     [provider]  LYRICA 75 MG capsule Take 75 mg by mouth 2 (two) times daily.  07/24/16   [provider]  Melatonin 10 MG CAPS Take 10 mg by mouth at  bedtime.     [provider]  naproxen sodium (ALEVE) 220 MG tablet Take 220 mg by mouth daily as needed (pain).    [provider]  omeprazole (PRILOSEC) 40 MG capsule Take 40 mg by mouth daily.    [provider]  omeprazole-sodium bicarbonate (ZEGERID) 40-1100 MG capsule Take 1 capsule by mouth daily. 10/11/16   [provider]  ondansetron (ZOFRAN-ODT) 8 MG disintegrating tablet Take 8 mg by mouth every 8 (eight) hours as needed for nausea or vomiting.    [provider]  oxycodone (ROXICODONE) 30 MG immediate release tablet Take 30 mg by mouth every 4 (four) hours as needed for pain. 12/01/18   [provider]  predniSONE (DELTASONE) 20 MG tablet Take 20 mg by mouth daily.  10/11/16   [provider]  promethazine (PHENERGAN) 25 MG tablet Take 25 mg by mouth every 6 (six) hours as needed for nausea or vomiting.    [provider]  propranolol (INDERAL) 10 MG tablet Take 10 mg by mouth 2 (two) times daily.     [provider]  solifenacin (VESICARE) 10 MG tablet Take 10 mg by mouth at bedtime.  07/03/15   [provider]  thyroid (ARMOUR) 120 MG tablet Take 120 mg by mouth daily before breakfast.    [provider]  tiZANidine (ZANAFLEX) 4 MG tablet Take 4 mg by mouth 3 (three) times daily as needed. 01/24/20   [provider]  torsemide (DEMADEX) 20 MG tablet Take 20 mg by mouth daily. 11/29/19   [provider]  Vitamin D, Ergocalciferol, (DRISDOL) 1.25 MG (50000 UNIT) CAPS capsule Take 50,000 Units by mouth once a week. 01/24/20   [provider]  Allergies    Adhesive [tape], Bactrim, Clarithromycin, Ibuprofen, Nexium [esomeprazole], Nsaids, Other, Peg 3350-electrolytes, Sulfa antibiotics, Tisagenlecleucel, Tolmetin, and Latex  Review of Systems   Review of Systems  Eyes: Negative for blurred vision.  Musculoskeletal: Positive for neck stiffness.  Neurological:  Positive for headaches.  All other systems reviewed and are negative.   Physical Exam Updated Vital Signs BP (!) 221/118 (BP Location: Right Arm)   Pulse (!) 114   Temp 99.4 F (37.4 C) (Oral)   Resp 20   LMP 02/04/2018   SpO2 95%   Physical Exam Vitals and nursing note reviewed.  Constitutional:      Appearance: She is well-developed.  HENT:     Head: Normocephalic.     Mouth/Throat:     Mouth: Mucous membranes are moist.  Eyes:     Extraocular Movements: Extraocular movements intact.     Pupils: Pupils are equal, round, and reactive to light.  Cardiovascular:     Rate and Rhythm: Normal rate and regular rhythm.     Heart sounds: Normal heart sounds.  Pulmonary:     Effort: Pulmonary effort is normal.  Abdominal:     General: There is no distension.  Musculoskeletal:        General: Normal range of motion.     Cervical back: Normal range of motion.  Neurological:     Mental Status: She is alert and oriented to person, place, and time.  Psychiatric:        Mood and Affect: Mood normal.     ED Results / Procedures / Treatments   Labs (all labs ordered are listed, but only abnormal results are displayed) Labs Reviewed  CBC WITH DIFFERENTIAL/PLATELET  COMPREHENSIVE METABOLIC PANEL    EKG None  Radiology No results found.  Procedures Procedures (including critical care time)  Medications Ordered in ED Medications  hydrALAZINE (APRESOLINE) injection 10 mg (10 mg Intravenous Given 02/10/20 1541)    ED Course  I have reviewed the triage vital signs and the nursing notes.  Pertinent labs & imaging results that were available during my care of the patient were reviewed by me and considered in my medical decision making (see chart for details).    MDM Rules/Calculators/A&P                          IV hydralazine ordered.  ekg and labs ordered.  Pt discussed with Dr. Roderic Palau.  Pt turned over to Dr. Roderic Palau labs pending  Final Clinical Impression(s) / ED  Diagnoses Final diagnoses:  None    Rx / DC Orders ED Discharge Orders    None       Sidney Ace 02/10/20 1550    Milton Ferguson, MD 02/11/20 [phone]

## 2020-02-10 NOTE — ED Triage Notes (Addendum)
Reports headache since Tuesday. Took Tylenol, naproxen and oxycodone 30 mg w/ no relief. States headache started on base of head and radiates towards the front. Reporting neck pain and stiffness. Also reports lower back on R side. States last night she felt feverish and mild nausea. No vomiting.   Hx of gluacoma, hypertension, diabetes type 2  States she was not given medications for HTN or Diabetes   Patient and son are currently homeless

## 2020-02-12 ENCOUNTER — Other Ambulatory Visit (HOSPITAL_COMMUNITY)
Admission: RE | Admit: 2020-02-12 | Discharge: 2020-02-12 | Disposition: A | Discharge status: Home / Self Care | Payer: Medicaid Other | Source: Ambulatory Visit | Attending: Neurosurgery | Admitting: Neurosurgery

## 2020-02-12 DIAGNOSIS — Z20822 Contact with and (suspected) exposure to covid-19: Secondary | ICD-10-CM | POA: Diagnosis not present

## 2020-02-12 DIAGNOSIS — Z01812 Encounter for preprocedural laboratory examination: Secondary | ICD-10-CM | POA: Insufficient documentation

## 2020-02-13 ENCOUNTER — Encounter (HOSPITAL_COMMUNITY): Payer: Self-pay | Admitting: Neurosurgery

## 2020-02-13 LAB — SARS CORONAVIRUS 2 (TAT 6-24 HRS): SARS Coronavirus 2: NEGATIVE

## 2020-02-13 NOTE — Progress Notes (Signed)
Alexis [name] states that she has chest pain when her heart rate speeds up, " maybe 2 times a week, happens when I'm lying or standing." Patient reports that it is a ache in the sternal area, "it takes my breath when it starts."  Alexis [name] said that she told the cardiologist she saw at [place] today.  Patient was started on Metoprolol and Amlodipine. I will request records.  Alexis [name] has type II diabetes, she does not check CBGs often; she is not on medication for diabetes.   Alexis [name] is homeless, she said she will shower if she gets a hotel room the night before.

## 2020-02-14 NOTE — Anesthesia Preprocedure Evaluation (Addendum)
Anesthesia Evaluation  Patient identified by MRN, date of birth, ID band Patient awake    Reviewed: Allergy & Precautions, NPO status , Patient's Chart, lab work & pertinent test results  History of Anesthesia Complications (+) PONV, Family history of anesthesia reaction and history of anesthetic complications  Airway Mallampati: II  TM Distance: >3 FB Neck ROM: Full    Dental  (+) Teeth Intact, Dental Advisory Given   Pulmonary shortness of breath, asthma , sleep apnea ,    Pulmonary exam normal breath sounds clear to auscultation       Cardiovascular hypertension, Pt. on home beta blockers and Pt. on medications +CHF  Normal cardiovascular exam Rhythm:Regular Rate:Normal  Echo 2017 - Left ventricle: Systolic function was normal. Wall motion was normal; there were no regional wall motion abnormalities.  - Left atrium: No evidence of thrombus in the atrial cavity or appendage.  - Right atrium: No evidence of thrombus in the atrial cavity or appendage.  - Atrial septum: There was a possible, small fenestrated pinhole ASD by color Doppler only There was no atrial level shunt.  - Impressions: Normal study.    Neuro/Psych  Headaches, PSYCHIATRIC DISORDERS Anxiety Depression  Neuromuscular disease    GI/Hepatic Neg liver ROS, hiatal hernia, PUD, GERD  ,  Endo/Other  diabetesHypothyroidism Morbid obesity  Renal/GU negative Renal ROS     Musculoskeletal  (+) Arthritis , Fibromyalgia -  Abdominal (+) + obese,   Peds  Hematology  (+) anemia ,   Anesthesia Other Findings   Reproductive/Obstetrics                                                            Anesthesia Evaluation  Patient identified by MRN, date of birth, ID band Patient awake    Reviewed: Allergy & Precautions, NPO status , Patient's Chart, lab work & pertinent test results, reviewed documented beta blocker date and time    History of Anesthesia Complications (+) PONV and history of anesthetic complications  Airway Mallampati: II  TM Distance: >3 FB Neck ROM: Full    Dental  (+) Teeth Intact, Dental Advisory Given   Pulmonary shortness of breath, asthma , sleep apnea ,    Pulmonary exam normal breath sounds clear to auscultation       Cardiovascular hypertension, Pt. on home beta blockers +CHF  Normal cardiovascular exam Rhythm:Regular Rate:Normal  ASD   Neuro/Psych  Headaches, PSYCHIATRIC DISORDERS Anxiety TIA, NECK PAIN, LOW BACK PAIN, THORACIC PAIN TIA Neuromuscular disease    GI/Hepatic Neg liver ROS, hiatal hernia, PUD, GERD  ,  Endo/Other  diabetes, Well Controlled, Type 2Hypothyroidism papillary thyroid cancer (s/p total thyroidectomy 09/13/74 complicated by left vocal cord paralysis; post-surgical hypothyroidism  Renal/GU negative Renal ROS   IC    Musculoskeletal  (+) Arthritis , Fibromyalgia -  Abdominal   Peds  Hematology  (+) Blood dyscrasia, Sickle cell trait and anemia ,   Anesthesia Other Findings Day of surgery medications reviewed with the patient.  Reproductive/Obstetrics                          Anesthesia Physical Anesthesia Plan  ASA: III  Anesthesia Plan: General   Post-op Pain Management:    Induction: Intravenous  PONV Risk Score and Plan:  4 or greater and Dexamethasone, Ondansetron, Midazolam and Diphenhydramine  Airway Management Planned: Oral ETT  Additional Equipment:   Intra-op Plan:   Post-operative Plan: Extubation in OR  Informed Consent: I have reviewed the patients History and Physical, chart, labs and discussed the procedure including the risks, benefits and alternatives for the proposed anesthesia with the patient or authorized representative who has indicated his/her understanding and acceptance.     Dental advisory given  Plan Discussed with: CRNA  Anesthesia Plan Comments: (PAT note  written 02/06/2019 by Myra Gianotti, PA-C. )    Anesthesia Quick Evaluation  Anesthesia Physical Anesthesia Plan  ASA: III  Anesthesia Plan: General   Post-op Pain Management:    Induction: Intravenous  PONV Risk Score and Plan: 4 or greater and Ondansetron, Dexamethasone and Treatment may vary due to age or medical condition  Airway Management Planned: Oral ETT  Additional Equipment: None  Intra-op Plan:   Post-operative Plan: Extubation in OR  Informed Consent: I have reviewed the patients History and Physical, chart, labs and discussed the procedure including the risks, benefits and alternatives for the proposed anesthesia with the patient or authorized representative who has indicated his/her understanding and acceptance.     Dental advisory given  Plan Discussed with: CRNA  Anesthesia Plan Comments: (See PAT note written 02/14/2020 by Myra Gianotti, PA-C. Had echo on 02/07/20 and follow-up with cardiologist Dr. Brigitte Pulse at [facility] [phone]) on 02/13/20. Records not yet available to send and staff did not return calls on 02/14/20 after multiple messages left.   Ahlaya Ende note: Unable to obtain records. Talked to multiple representatives from [facility] and no one could access records. Dr. Brigitte Pulse, who the patient saw on Tuesday, was apparently in a room with another patient. Waiting for a call back. Will proceed with MRI.  Update: After case started, [facility] provided echo read. LVSF 70%, AV not well visualized.)     Anesthesia Quick Evaluation

## 2020-02-14 NOTE — Progress Notes (Signed)
Anesthesia Chart Review: Alexis [name]  Case: [id number] Date/Time: 02/15/20 0946   Procedure: MRI LUMBAR WITHOUT CONTRAST (N/A )   Anesthesia type: General   Pre-op diagnosis: DISC DISPLACEMENT LUMBAR   Location: [facility] OR ROOM 1 / [facility]   Surgeons: Radiologist, Medication, MD      DISCUSSION: Patient is a 54 year old female scheduled for a MRI L-spine under anesthesia. MRI was ordered by neurosurgeon Ashok Pall, MD. 02/05/20 H&P scanned under Media tab, Radiology Order.    History includesnever smoker, post-operative N/V,DM2, papillary thyroid cancer (s/p total thyroidectomy 11/13/45 complicated by left vocal cord paralysis; post-surgical hypothyroidism), tremor, fibromyalgia,Sjogren's syndrome, asthma, dyspnea,GERD, hiatal hernia, IBS,colitis,OSA (not currently using CPAP due broken and trying to get new machine), HTN, CHF (not-specified; reported prior to 2017),hypercholesterolemia, lymphedema, glaucoma, sickle cell trait, anemia, fatty liver, PTSD. She had two echocardiograms in 2017. TTE in March 2017 showed a small fenestrated of secundum ASD with "probable" bidirectional shunting, and TEE in May 2017 showed a possible smallfenestrated pinhole with no atrial level shunt--there was no suggestion of intracardiac shunting by [place] 10/28/15.   - Anesthesia concerns include post-operative N/V, "awaken" during anesthesia in the past, and history of left vocal cord paralysis.   - ED visit 9/25/21for headache (not relieved with Tylenol, naproxen, oxycodone 30 mg), neck pain, right back pain, mild nausea and felt feverish the night before. BP 221/118. EKG not significantly changed. WBC 17K (WBC trends ~ 13-15K since 2017). ED note does not suggest concern for active infection. Head CT negative. She was treated with hydralazine IV (15 mg total), acetaminophen, Morphine. She was discharged from ED with metoprolol succinate 25 mg daily and Norco 5/325 mg as needed.   - Reportedly had cardiology  evaluation at [facility] on 02/13/20 for HTN, chest pain, and 02/07/20 echo follow-up and was started on amlodipine 10 mg daily and metoprolol increased to 50 mg daily. She says BP there was 168/87. Records have not yet been sent to their medical records department. I have contacted [facility] at [facility] [phone]) twice on 02/14/20 to talk with cardiologist Brigitte Pulse, Margarette Canada, MD or his nurse but both times have only been able to leave a voice message for someone to call me.  Patient seems very knowledgeable of her care, so she was able to provide several details. She said she self referred herself to Dr. Brigitte Pulse because she had been having increasing palpitations, some associated with chest pain--although symptoms present since at least 2017 but more frequent since just before COVID pandemic. She describes palpitations as lasting 2-3 minutes about twice a week. No syncope. Chest symptoms can last 30 seconds to 2 minutes and describes as a "dull thump" and can be associated with activity, rolling over in bed, palpitations. She reported discussed these symptoms with Dr. Brigitte Pulse and says only echo was done showing her "heart was thick". She had another CMET done there on 02/13/20. She also sees main management at [facility] and had a rapid 15 lb weight gain over about two weeks after her dose was increased, so now the dose was reduced. She is able to walk independently but does have chronic DOE, somewhat worse since her weight gain. Activity is also limited by her back. In regards to her ED visit, she says she still continues with a headache and feels her neck remains sore, but overall symptoms improved--no longer feeling ill or feverish. She was not aware her WBC was 17K, but says it has been elevated  in the past (~ 13-15K). She is on chronic prednisone for ulcerative colitis (takes 10 mg daily).  She felt dizzy after the increase in her BP meds, so she is taking them at night.  She confirmed she has someone  to stay with her after procedure.   She is a same day work-up. Pre-procedure COVID-19 test negative on 02/12/20. Reportedly patient and son are currently homeless. Per PAT RN phone interview, patient does have someone to stay with her after procedure and currently had a hotel room at that moment. Holding room RN Coordinator aware, so living arrangement status changes then staff can contact CSW/Case Management if needed. As of 5:00 PM, cardiology had not returned my phone call. Discussed with anesthesiologist Roberts Gaudy, MD. Anesthesia team to evaluate, and can consider contacting [facility] again for records if indicated.     VS: Ht 5\' 4"  (1.626 m)   Wt 109.8 kg   LMP 02/04/2018   BMI 41.54 kg/m    PROVIDERS: Benito Mccreedy, MDis PCP, primarily sees [place], [place], [place]  Kathrynn Ducking, MD is neurologist Iran Planas, MD is endocrinologist ([place]) Brigitte Pulse, [place] Josph Macho, MD is cardiologist [facility]). Recently established, last visit 02/13/20. Previously saw Adrian Prows, MD in 2017 due to dyspnea and atypical chest pain. She had reported a history of CHF from years prior, but no real details known. She had a non-ischemic stress test, EF 46%, but EF 55% by echo. She had a RHC and TEE to assess for ASD and right heart pressures. Possible pinhole ASD seen with no shunt on TEE and no shunt on RHC.   LABS: She had labs on 02/10/20 in the ED. Last results include: Lab Results  Component Value Date   WBC 17.2 (H) 02/10/2020   HGB 14.2 02/10/2020   HCT 42.6 02/10/2020   PLT 426 (H) 02/10/2020   GLUCOSE 113 (H) 02/10/2020   ALT 38 02/10/2020   AST 26 02/10/2020   NA 144 02/10/2020   K 4.5 02/10/2020   CL 102 02/10/2020   CREATININE 0.82 02/10/2020   BUN 8 02/10/2020   CO2 31 02/10/2020     IMAGES: CT Head 02/10/20: IMPRESSION: No acute intracranial abnormality.   EKG: 02/10/20: Sinus rhythm Borderline abnrm T, anterolateral leads 12 Lead;  [place] Since last tracing rate faster Confirmed by Dorie Rank [phone]) on 02/11/2020 6:45:33 PM   CV: Echo 02/07/20 [facility]): Report pending.    Carotid US 12/06/18: Summary: Right Carotid: Velocities in the right ICA are consistent with a 1-39% stenosis. Left Carotid: Velocities in the left ICA are consistent with a 1-39% stenosis. Vertebrals: Bilateral vertebral arteries demonstrate antegrade flow.   Echo 04/01/18 ([facility]): Conclusions: 1. Mild concentric LVH with normal LV internal dimension and wall motion. LVEF 69%. 2. The right ventricle is normal in size and contractility. 3. There is no evidence of left ventricular diastolic dysfunction. 4. The right atrium is normal in size. The left atrium is normal. 5. The aortic root is normal in size. The aortic valve is mildly calcified. 6. The tricuspid, pulmonic, aortic and mitral valves are morphologically normal and open adequately. The mitral valve is mildly thickened. 7. The intra-atrial and intraventricular septum are intact. 8. Color-flow, pulse and continuous wave Doppler interrogation reveal(report ends at this point and is signed by Sherol Dade, MD, [facility])   [place] 10/28/15: Conclusion: RA 7/4, mean 4 mmHg. RV 36/6, end-diastolic pressure 6 to limit her mercury. PA 19/9, the  14 mmHg. PA saturation 78%. Pulmonary Artery wedge 11/9, mean 8 mmHg. Aortic saturation 100%. Cardiac output 7.11, cardiac index 3.65 by Fick. QT/QS 1.0. Impression: Normal right heart catheterizaton with preserved cardiac output and cardiac index. No suggestion of intracardiac shunting with normal Qp:Qs ratio.   Lexiscan myoview stress test 07/22/15([facility]): Impression: 1. The resting electrocardiogram demonstrated normal sinus rhythm, normal resting conduction and no resting arrhythmias. Stress EKG is non-diagnostic for ischemia as it a pharmacologic stress using Lexiscan. Stress symptoms  included dyspnea. 2. Myocardial perfusion imaging is normal. Overall left ventricular systolic function was normal without regional wall motion abnormalities. The left ventricular ejection fraction was visually normal but calculated at 46%. This is a low risk study.   Past Medical History:  Diagnosis Date  . 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 Anemia   . ASD (atrial septal defect)    per pt  . Asthma   . Bilateral dry eyes   . Cervical spondylosis    C5-6  . CHF (congestive heart failure) ([place])    not sure  . Chronic back pain   . Chronic bronchitis ([place])   . Chronic colitis   . Chronic lower GI bleeding   . Complication of anesthesia    has awakened in past during surgery/  [place]  03-08-2009 ([facility])  . Degenerative disk disease    "cervical to sacrum" (05/17/2017)  . Depression   . Diabetes mellitus without complication ([place])   . Diabetic neuropathy, type II diabetes mellitus ([place]) 12/14/2014   not on medication  . DJD (degenerative joint disease) of knee   . Dyspnea   . Endometriosis 01/18/01  . Family history of adverse reaction to anesthesia    "mom wakes up during procedures"  . Fatty liver   . Fibromyalgia   . Gastric ulcer   . GERD (gastroesophageal reflux disease)   . Glaucoma of both eyes   . H/O hiatal hernia   . High cholesterol   . History of colon polyps   . Hypertension   . Hypothyroidism, postsurgical   . IBS (irritable bowel syndrome)   . IC (interstitial cystitis)   . Interstitial cystitis   . Lymphedema   . Migraine    "monthly" (05/17/2017)  02/06/2019- more frequently  . OSA on CPAP   . Osteoarthritis    "all over; mainly knees and back" (05/17/2017)  . PONV (postoperative nausea and vomiting)   . PTSD (post-traumatic stress disorder)   . PTSD (post-traumatic stress disorder)   . Sickle cell trait ([place])   . Sjogren's syndrome ([place])   . Thyroid cancer ([place])   . Tremor  12/14/2014   hand  . Uterine fibroid   . Varicose veins   . Vestibular dizziness   . Vocal cord paralysis, unilateral complete    "left";  POST TOTAL THYROIDECTOMY    Past Surgical History:  Procedure Laterality Date  . ACHILLES TENDON REPAIR Left 2007  . BUNIONECTOMY WITH HAMMERTOE RECONSTRUCTION AND GASTROC SLIDE Left FEB  2011   REMOVAL HARDWARE  NOV  2011  . CARDIAC CATHETERIZATION N/A 10/28/2015   Procedure: Right Heart Cath;  Surgeon: Adrian Prows, MD;  Location: [place] [facility];  Service: Cardiovascular;  Laterality: N/A;  . COLONOSCOPY WITH ESOPHAGOGASTRODUODENOSCOPY (EGD)  MULTIPLE --  LAST ONE   NOV  2014  . CYSTO/  BILATERAL RETROGRADE PYELOGRAM/  HYDRODISTENTION/  INSTILLATION THERAPY  05-08-2003  .  CYSTO/  HYDRODISTENTION/  INSTILLATION THERAPY  06-04-2005  &  03-04-2007  . CYSTO/  URETHRAL DILATION /  BILATERAL UNROOFING URETEROCELE/  BILATERAL URETERAL STENT PLACEMENT  12-01-2002  . D & C HYSTEROSCOPY/  REMOVAL IUD  12-06-2003  . DE QUERVAIN'S RELEASE Left ?09/2005  . DIAGNOSTIC LAPAROSCOPY    . DILATATION AND CURETTAGE WITH SUCTION  05-29-2005   RETAINED PLACENTA  . DILATION AND CURETTAGE OF UTERUS    . DX LAPAROSCOPY/  PELVIC BX'S/  LYSIS ADHESIONS  06-26-2000  . FOOT HARDWARE REMOVAL Left 03/2010   REMOVAL HARDWARE "related to earlier hammertoe OR"  . HYDRADENITIS EXCISION  06/29/2011   Procedure: EXCISION HYDRADENITIS AXILLA;  Surgeon: Hermelinda Dellen, MD;  Location: [place];  Service: Plastics;;  right breast hydradenitis excioion  . HYDRADENITIS EXCISION Left 2000  . INCISION AND DRAINAGE ABSCESS ANAL  02-14-2001  . KNEE ARTHROSCOPY WITH LATERAL MENISECTOMY Right 09/08/2013   Procedure: RIGHT KNEE ARTHROSCOPY PARTIAL LATERAL MENISCECTOMY AND DEBRIDEMENT ;  Surgeon: Johnn Hai, MD;  Location: [place];  Service: Orthopedics;  Laterality: Right;  . LAPAROSCOPY ABDOMEN DIAGNOSTIC  SEPT  2003   [place]   AND PLACEMENT  IUD  . MASS EXCISION  10/29/2003   WIDE EXCISION CHEST AREA  . PILONIDAL CYST EXCISION  1983  . RADIOLOGY WITH ANESTHESIA N/A 02/07/2019   Procedure: MRI BRAIN WITHOUT IV CONTRAST; MRI CERVICAL, THORACIC, AND LUMBAR SPINE WITH AND WITHOUT CONTRAST;  Surgeon: Radiologist, Medication, MD;  Location: [place];  Service: Radiology;  Laterality: N/A;  . REDUCTION MAMMAPLASTY  1997  . TEE WITHOUT CARDIOVERSION N/A 09/26/2015   Procedure: TRANSESOPHAGEAL ECHOCARDIOGRAM (TEE);  Surgeon: Adrian Prows, MD;  Location: [place];  Service: Cardiovascular;  Laterality: N/A;  . TEMPORAL ARTERY BIOPSY / LIGATION Left 2010  . TEMPORAL ARTERY BIOPSY / LIGATION Right 06-16-2002  . TONSILLECTOMY  1990  . TOTAL THYROIDECTOMY  11/06/2008    MEDICATIONS: No current facility-administered medications for this encounter.   Marland Kitchen amLODipine (NORVASC) 10 MG tablet  . metoprolol succinate (TOPROL-XL) 50 MG 24 hr tablet  . acetaminophen (TYLENOL) 500 MG tablet  . albuterol (PROVENTIL HFA;VENTOLIN HFA) 108 (90 BASE) MCG/ACT inhaler  . atorvastatin (LIPITOR) 20 MG tablet  . cetirizine (ZYRTEC) 10 MG chewable tablet  . diclofenac sodium (VOLTAREN) 1 % GEL  . Diclofenac Sodium 3 % GEL  . DULERA 200-5 MCG/ACT AERO  . DULoxetine (CYMBALTA) 30 MG capsule  . HYDROcodone-acetaminophen (NORCO/VICODIN) 5-325 MG tablet  . hydrOXYzine (ATARAX/VISTARIL) 50 MG tablet  . Lidocaine 0.5 % GEL  . LYRICA 75 MG capsule  . Melatonin 10 MG CAPS  . metoprolol succinate (TOPROL-XL) 25 MG 24 hr tablet  . naproxen sodium (ALEVE) 220 MG tablet  . omeprazole (PRILOSEC) 40 MG capsule  . omeprazole-sodium bicarbonate (ZEGERID) 40-1100 MG capsule  . ondansetron (ZOFRAN-ODT) 8 MG disintegrating tablet  . predniSONE (DELTASONE) 20 MG tablet  . promethazine (PHENERGAN) 25 MG tablet  . solifenacin (VESICARE) 10 MG tablet  . thyroid (ARMOUR) 120 MG tablet  . tiZANidine (ZANAFLEX) 4 MG tablet  . torsemide (DEMADEX) 20 MG tablet  . Vitamin D,  Ergocalciferol, (DRISDOL) 1.25 MG (50000 UNIT) CAPS capsule     Myra Gianotti, PA-C Surgical Short Stay/Anesthesiology [facility] Phone [phone] [facility] Phone [phone] 02/14/2020 5:05 PM

## 2020-02-15 ENCOUNTER — Ambulatory Visit (HOSPITAL_COMMUNITY): Payer: Medicaid Other | Admitting: Vascular Surgery

## 2020-02-15 ENCOUNTER — Encounter (HOSPITAL_COMMUNITY)
Admission: RE | Disposition: A | Discharge status: Home / Self Care | Payer: Self-pay | Source: Home / Self Care | Attending: Neurosurgery

## 2020-02-15 ENCOUNTER — Ambulatory Visit (HOSPITAL_COMMUNITY)
Admission: RE | Admit: 2020-02-15 | Discharge: 2020-02-15 | Disposition: A | Discharge status: Home / Self Care | Payer: Medicaid Other | Source: Ambulatory Visit | Attending: Neurosurgery | Admitting: Neurosurgery

## 2020-02-15 ENCOUNTER — Ambulatory Visit (HOSPITAL_COMMUNITY)
Admission: RE | Admit: 2020-02-15 | Discharge: 2020-02-15 | Disposition: A | Discharge status: Home / Self Care | Payer: Medicaid Other | Attending: Neurosurgery | Admitting: Neurosurgery

## 2020-02-15 ENCOUNTER — Other Ambulatory Visit: Payer: Self-pay

## 2020-02-15 ENCOUNTER — Encounter (HOSPITAL_COMMUNITY): Payer: Self-pay | Admitting: Neurosurgery

## 2020-02-15 DIAGNOSIS — M5126 Other intervertebral disc displacement, lumbar region: Secondary | ICD-10-CM

## 2020-02-15 DIAGNOSIS — M797 Fibromyalgia: Secondary | ICD-10-CM | POA: Insufficient documentation

## 2020-02-15 DIAGNOSIS — E890 Postprocedural hypothyroidism: Secondary | ICD-10-CM | POA: Diagnosis not present

## 2020-02-15 DIAGNOSIS — M48061 Spinal stenosis, lumbar region without neurogenic claudication: Secondary | ICD-10-CM | POA: Insufficient documentation

## 2020-02-15 DIAGNOSIS — I110 Hypertensive heart disease with heart failure: Secondary | ICD-10-CM | POA: Insufficient documentation

## 2020-02-15 DIAGNOSIS — M47816 Spondylosis without myelopathy or radiculopathy, lumbar region: Secondary | ICD-10-CM | POA: Diagnosis not present

## 2020-02-15 DIAGNOSIS — E1140 Type 2 diabetes mellitus with diabetic neuropathy, unspecified: Secondary | ICD-10-CM | POA: Insufficient documentation

## 2020-02-15 DIAGNOSIS — K219 Gastro-esophageal reflux disease without esophagitis: Secondary | ICD-10-CM | POA: Insufficient documentation

## 2020-02-15 DIAGNOSIS — M5137 Other intervertebral disc degeneration, lumbosacral region: Secondary | ICD-10-CM | POA: Diagnosis not present

## 2020-02-15 DIAGNOSIS — Z79899 Other long term (current) drug therapy: Secondary | ICD-10-CM | POA: Diagnosis not present

## 2020-02-15 DIAGNOSIS — M3500 Sicca syndrome, unspecified: Secondary | ICD-10-CM | POA: Diagnosis not present

## 2020-02-15 DIAGNOSIS — F329 Major depressive disorder, single episode, unspecified: Secondary | ICD-10-CM | POA: Insufficient documentation

## 2020-02-15 DIAGNOSIS — Z7952 Long term (current) use of systemic steroids: Secondary | ICD-10-CM | POA: Insufficient documentation

## 2020-02-15 DIAGNOSIS — K589 Irritable bowel syndrome without diarrhea: Secondary | ICD-10-CM | POA: Diagnosis not present

## 2020-02-15 DIAGNOSIS — M47817 Spondylosis without myelopathy or radiculopathy, lumbosacral region: Secondary | ICD-10-CM | POA: Insufficient documentation

## 2020-02-15 DIAGNOSIS — Z7951 Long term (current) use of inhaled steroids: Secondary | ICD-10-CM | POA: Diagnosis not present

## 2020-02-15 DIAGNOSIS — K760 Fatty (change of) liver, not elsewhere classified: Secondary | ICD-10-CM | POA: Insufficient documentation

## 2020-02-15 DIAGNOSIS — M5136 Other intervertebral disc degeneration, lumbar region: Secondary | ICD-10-CM | POA: Diagnosis not present

## 2020-02-15 DIAGNOSIS — H409 Unspecified glaucoma: Secondary | ICD-10-CM | POA: Insufficient documentation

## 2020-02-15 DIAGNOSIS — I509 Heart failure, unspecified: Secondary | ICD-10-CM | POA: Diagnosis not present

## 2020-02-15 DIAGNOSIS — E7800 Pure hypercholesterolemia, unspecified: Secondary | ICD-10-CM | POA: Insufficient documentation

## 2020-02-15 DIAGNOSIS — G8929 Other chronic pain: Secondary | ICD-10-CM | POA: Insufficient documentation

## 2020-02-15 DIAGNOSIS — Z85850 Personal history of malignant neoplasm of thyroid: Secondary | ICD-10-CM | POA: Insufficient documentation

## 2020-02-15 DIAGNOSIS — D573 Sickle-cell trait: Secondary | ICD-10-CM | POA: Insufficient documentation

## 2020-02-15 DIAGNOSIS — G4733 Obstructive sleep apnea (adult) (pediatric): Secondary | ICD-10-CM | POA: Diagnosis not present

## 2020-02-15 DIAGNOSIS — J449 Chronic obstructive pulmonary disease, unspecified: Secondary | ICD-10-CM | POA: Insufficient documentation

## 2020-02-15 DIAGNOSIS — Z590 Homelessness: Secondary | ICD-10-CM | POA: Insufficient documentation

## 2020-02-15 DIAGNOSIS — Q211 Atrial septal defect: Secondary | ICD-10-CM | POA: Insufficient documentation

## 2020-02-15 HISTORY — DX: Depression, unspecified: F32.A

## 2020-02-15 HISTORY — PX: RADIOLOGY WITH ANESTHESIA: SHX6223

## 2020-02-15 LAB — GLUCOSE, CAPILLARY
Glucose-Capillary: 133 mg/dL — ABNORMAL HIGH (ref 70–99)
Glucose-Capillary: 110 mg/dL — ABNORMAL HIGH (ref 70–99)

## 2020-02-15 SURGERY — MRI WITH ANESTHESIA
Anesthesia: General

## 2020-02-15 MED ORDER — ORAL CARE MOUTH RINSE
15.0000 mL | Freq: Once | OROMUCOSAL | Status: AC
  Administered 2020-02-15: 15 mL via OROMUCOSAL

## 2020-02-15 MED ORDER — LIDOCAINE 2% (20 MG/ML) 5 ML SYRINGE
INTRAMUSCULAR | Status: DC | PRN
  Administered 2020-02-15: 100 mg via INTRAVENOUS

## 2020-02-15 MED ORDER — DEXAMETHASONE SODIUM PHOSPHATE 10 MG/ML IJ SOLN
INTRAMUSCULAR | Status: DC | PRN
  Administered 2020-02-15: 4 mg via INTRAVENOUS

## 2020-02-15 MED ORDER — PHENYLEPHRINE HCL-NACL 10-0.9 MG/250ML-% IV SOLN
INTRAVENOUS | Status: DC | PRN
  Administered 2020-02-15: 50 ug/min via INTRAVENOUS

## 2020-02-15 MED ORDER — MEPERIDINE HCL 25 MG/ML IJ SOLN: 6.2500 mg | INTRAMUSCULAR | Status: DC | PRN

## 2020-02-15 MED ORDER — FENTANYL CITRATE (PF) 100 MCG/2ML IJ SOLN
INTRAMUSCULAR | Status: AC
  Filled 2020-02-15: qty 2

## 2020-02-15 MED ORDER — LACTATED RINGERS IV SOLN: INTRAVENOUS | Status: DC

## 2020-02-15 MED ORDER — PHENYLEPHRINE HCL (PRESSORS) 10 MG/ML IV SOLN
INTRAVENOUS | Status: DC | PRN
  Administered 2020-02-15: 80 ug via INTRAVENOUS
  Administered 2020-02-15: 200 ug via INTRAVENOUS
  Administered 2020-02-15: 80 ug via INTRAVENOUS

## 2020-02-15 MED ORDER — MIDAZOLAM HCL 5 MG/5ML IJ SOLN
INTRAMUSCULAR | Status: DC | PRN
  Administered 2020-02-15: 2 mg via INTRAVENOUS

## 2020-02-15 MED ORDER — FENTANYL CITRATE (PF) 100 MCG/2ML IJ SOLN
25.0000 ug | INTRAMUSCULAR | Status: DC | PRN
  Administered 2020-02-15 (×2): 25 ug via INTRAVENOUS
  Administered 2020-02-15: 50 ug via INTRAVENOUS

## 2020-02-15 MED ORDER — SUCCINYLCHOLINE CHLORIDE 20 MG/ML IJ SOLN
INTRAMUSCULAR | Status: DC | PRN
  Administered 2020-02-15: 140 mg via INTRAVENOUS

## 2020-02-15 MED ORDER — PROPOFOL 10 MG/ML IV BOLUS
INTRAVENOUS | Status: DC | PRN
  Administered 2020-02-15: 200 mg via INTRAVENOUS

## 2020-02-15 MED ORDER — PROMETHAZINE HCL 25 MG/ML IJ SOLN: 6.2500 mg | INTRAMUSCULAR | Status: DC | PRN

## 2020-02-15 MED ORDER — ONDANSETRON HCL 4 MG/2ML IJ SOLN
INTRAMUSCULAR | Status: DC | PRN
  Administered 2020-02-15: 4 mg via INTRAVENOUS

## 2020-02-15 MED ORDER — MIDAZOLAM HCL 2 MG/2ML IJ SOLN
INTRAMUSCULAR | Status: AC
  Filled 2020-02-15: qty 2

## 2020-02-15 NOTE — Transfer of Care (Signed)
Immediate Anesthesia Transfer of Care Note  Patient: Alexis [name]  Procedure(s) Performed: MRI LUMBAR WITHOUT CONTRAST (N/A )  Patient Location: PACU  Anesthesia Type:General  Level of Consciousness: awake, alert  and oriented  Airway & Oxygen Therapy: Patient Spontanous Breathing and Patient connected to nasal cannula oxygen  Post-op Assessment: Report given to RN and Post -op Vital signs reviewed and stable  Post vital signs: Reviewed and stable  Last Vitals:  Vitals Value Taken Time  BP 144/87 02/15/20 1104  Temp 36.8 C 02/15/20 1104  Pulse 88 02/15/20 1105  Resp 13 02/15/20 1105  SpO2 93 % 02/15/20 1105  Vitals shown include unvalidated device data.  Last Pain:  Vitals:   02/15/20 0841  TempSrc:   PainSc: 6       Patients Stated Pain Goal: 3 (40/08/67 6195)  Complications: No complications documented.

## 2020-02-15 NOTE — Discharge Instructions (Signed)
[place]   [facility] [phone] Call for shelter availability.  You must have access to a phone so they can call you back.  [facility]) [place] [phone] M-F 8:00-3:00; S-S 8:00-2:00  [place], [place] [address] [phone] [email or web address]  [place] (Men and Women) [place] (Men and Women) [place] [phone]  [employer] [facility] (Domestic Violence Shelter) [place] [phone]  [facility] (Men) [place] [phone]  [place] (Single women and women with children) [address] [place] [phone]

## 2020-02-15 NOTE — Anesthesia Procedure Notes (Signed)
Procedure Name: Intubation Date/Time: 02/15/2020 10:15 AM Performed by: Inda Coke, CRNA Pre-anesthesia Checklist: Patient identified, Emergency Drugs available, Suction available and Patient being monitored Patient Re-evaluated:Patient Re-evaluated prior to induction Oxygen Delivery Method: Circle System Utilized Preoxygenation: Pre-oxygenation with 100% oxygen Induction Type: IV induction Ventilation: Mask ventilation without difficulty and Oral airway inserted - appropriate to patient size Laryngoscope Size: Glidescope and 4 Grade View: Grade I Tube type: Oral Tube size: 7.0 mm Number of attempts: 1 Airway Equipment and Method: Stylet and Oral airway Placement Confirmation: ETT inserted through vocal cords under direct vision,  positive ETCO2 and breath sounds checked- equal and bilateral Secured at: 22 cm Tube secured with: Tape Dental Injury: Teeth and Oropharynx as per pre-operative assessment

## 2020-02-15 NOTE — Clinical Social Work Note (Signed)
Contacted Camille (case mgmt) regarding pt being homeless with her 54 yo son. They are both living in her car. Pt states they were evicted from their home in March of this year and have been in her car ever since.Rosendo Gros will attempt to have a Education officer, museum speak with pt prior to d/c and will be adding resources to her AVS.

## 2020-02-15 NOTE — Progress Notes (Signed)
CSW received call from [place], [place] regarding the patient and her son being homeless. CSW spoke with patient's RN Tamitha regarding the resources that were added to the patient's AVS. CSW asked RN to encourage the patient to reach out to DSS for assistance and also the child's school social worker for assistance finding shelter.  Madilyn Fireman, MSW, LCSW-A Transitions of Care  Clinical Social Worker  [facility] Emergency Departments  Medical ICU [phone]

## 2020-02-15 NOTE — Discharge Planning (Signed)
RNCM consulted regarding pt homeless issues.  RNCM placed homeless resources on AVS and contacted SW for additional resources.

## 2020-02-16 ENCOUNTER — Encounter (HOSPITAL_COMMUNITY): Payer: Self-pay | Admitting: Radiology

## 2020-02-16 NOTE — Anesthesia Postprocedure Evaluation (Signed)
Anesthesia Post Note  Patient: Alexis [name]  Procedure(s) Performed: MRI LUMBAR WITHOUT CONTRAST (N/A )     Patient location during evaluation: PACU Anesthesia Type: General Level of consciousness: sedated and patient cooperative Pain management: pain level controlled Vital Signs Assessment: post-procedure vital signs reviewed and stable Respiratory status: spontaneous breathing Cardiovascular status: stable Anesthetic complications: no   No complications documented.  Last Vitals:  Vitals:   02/15/20 1200 02/15/20 1300  BP: 112/70 130/84  Pulse: 86 84  Resp: 18 16  Temp: 36.9 C   SpO2: 98% 98%    Last Pain:  Vitals:   02/15/20 1200  TempSrc:   PainSc: [place]

## 2020-03-06 ENCOUNTER — Encounter: Payer: Self-pay | Admitting: *Deleted

## 2020-03-06 NOTE — Congregational Nurse Program (Signed)
CN discussed options for getting a reacher to use to accomodate her lack of strength in hands due to arthritis.  Patient should seek procuring reacher from [employer] or [employer] per suggestion from Asst. Director CN, Jalene Mullet as Medicaid unlikely to pay for reacher.  CN educated client on strategy for taking medications successfully.  Patient reports that she lacks feeling in both hands (peripheral neuropathy bilaterally) at times and frequently cannot feel pills or capsules.  CN suggested patient obtain daily labeled pill box and pill cups to take medication.  Patient can transfer pills from pill holder to pill cup and take medication.  This should eliminate patient dropping medication because of lack of feeling in hands.

## 2020-03-17 ENCOUNTER — Telehealth: Payer: Self-pay | Admitting: *Deleted

## 2020-03-17 ENCOUNTER — Encounter: Payer: Self-pay | Admitting: *Deleted

## 2020-03-17 NOTE — Telephone Encounter (Signed)
Spoke with Glennis Brink, Director of [employer] about client.  Client had reported to Ms. Margorie John that she had only been been given a few minutes of time during meeting with CN.  CN shared with Ms. Margorie John that time spent with client was at least 45 minutes if not 1 hour. Ms. Margorie John concerned about client's failure to follow shelter rules as well as overt disregard for authority at shelter.  Ms. Margorie John reports that patient refuses to participate in chores that are require of all residents because she states that she has physical illnesses that prevent her from working.  Client also was told by Dr. Blenda Mounts, interim director of shelter and another authority figure that she was not allowed to bring in outside food to the shelter.  Client disregarded rules and purchased [employer] and [employer] on two separate occasions and brought into shelter.  Client also fails to take 65 year old son to school. Ms Margorie John expressed deep frustration with handling situation as her role is to advance client to housing of her own. Ms. Margorie John requested that CN share insight into medical situation of client.  CN shared with Ms. Margorie John that client had stated to CN she had an appointment for a medical problem on 03/07/20 and client had upcoming appointments for medical problems on 03/25/20 and 03/26/20.  CN's recommendation to Ms. Margorie John is to request of client to bring documentation from medical providers outlining her medical problems that prevent her from participating in physical chores at [facility].  This will clarify whether client should be exempt from these activities.

## 2020-03-17 NOTE — Congregational Nurse Program (Signed)
Dept: [id number]   Congregational Nurse Program Note  Date of Encounter: 03/14/20  Past Medical History: Past Medical History:  Diagnosis Date  . 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 Anemia   . ASD (atrial septal defect)    per pt  . Asthma   . Bilateral dry eyes   . Cervical spondylosis    C5-6  . CHF (congestive heart failure) ([place])    not sure  . Chronic back pain   . Chronic bronchitis ([place])   . Chronic colitis   . Chronic lower GI bleeding   . Complication of anesthesia    has awakened in past during surgery/  [place]  03-08-2009 ([facility])  . Degenerative disk disease    "cervical to sacrum" (05/17/2017)  . Depression   . Diabetes mellitus without complication ([place])   . Diabetic neuropathy, type II diabetes mellitus ([place]) 12/14/2014   not on medication  . DJD (degenerative joint disease) of knee   . Dyspnea   . Endometriosis 01/18/01  . Family history of adverse reaction to anesthesia    "mom wakes up during procedures"  . Fatty liver   . Fibromyalgia   . Gastric ulcer   . GERD (gastroesophageal reflux disease)   . Glaucoma of both eyes   . H/O hiatal hernia   . High cholesterol   . History of colon polyps   . Hypertension   . Hypothyroidism, postsurgical   . IBS (irritable bowel syndrome)   . IC (interstitial cystitis)   . Interstitial cystitis   . Lymphedema   . Migraine    "monthly" (05/17/2017)  02/06/2019- more frequently  . OSA on CPAP   . Osteoarthritis    "all over; mainly knees and back" (05/17/2017)  . PONV (postoperative nausea and vomiting)   . PTSD (post-traumatic stress disorder)   . PTSD (post-traumatic stress disorder)   . Sickle cell trait ([place])   . Sjogren's syndrome ([place])   . Thyroid cancer ([place])   . Tremor 12/14/2014   hand  . Uterine fibroid   . Varicose veins   . Vestibular dizziness   . Vocal cord paralysis, unilateral complete    "left";  POST TOTAL  THYROIDECTOMY    Encounter Details:  CNP Questionnaire - 03/14/20 1939      Questionnaire   Do you give verbal consent to treat you today? Yes    Visit Setting Church or Organization    Location Patient Served At [facility]    Patient Status Homeless    Medical Provider Yes    Insurance Medicaid    Intervention Assess (including screenings);Educate;Refer    Housing/Utilities No permanent housing    Transportation Within past 12 months, lack of transportation negatively impacted life    Interpersonal Safety --   Patient feels physically safe where she is living.   Food Within past 12 months, worried food would run out with no money to buy more    Medication Have medication insecurities    Referrals Area Agency         Patient requested to speak with CN, Desma Paganini  and CN, Brett Canales as we were finishing  meeting with another client.  She reported that her son, Mysha Peeler was constipated and had been attempted to defecate for 1 hour.  CNs advised mother to see if shelter had prune juice available so she could give warm prune juice to son.  She  stated she would try that.  Also, advised mother to try over  counter medication such as MIralax.  Mother reported that she did not have money to purchase OTC medication as she had not received her child support check yet.

## 2020-03-20 ENCOUNTER — Encounter (HOSPITAL_COMMUNITY): Payer: Self-pay | Admitting: Emergency Medicine

## 2020-03-20 ENCOUNTER — Emergency Department (HOSPITAL_COMMUNITY)
Admission: EM | Admit: 2020-03-20 | Discharge: 2020-03-20 | Disposition: A | Discharge status: Home / Self Care | Payer: Medicaid Other | Attending: Emergency Medicine | Admitting: Emergency Medicine

## 2020-03-20 ENCOUNTER — Emergency Department (HOSPITAL_COMMUNITY): Payer: Medicaid Other

## 2020-03-20 ENCOUNTER — Other Ambulatory Visit: Payer: Self-pay

## 2020-03-20 DIAGNOSIS — I509 Heart failure, unspecified: Secondary | ICD-10-CM | POA: Diagnosis not present

## 2020-03-20 DIAGNOSIS — R1010 Upper abdominal pain, unspecified: Secondary | ICD-10-CM | POA: Diagnosis present

## 2020-03-20 DIAGNOSIS — R112 Nausea with vomiting, unspecified: Secondary | ICD-10-CM

## 2020-03-20 DIAGNOSIS — E039 Hypothyroidism, unspecified: Secondary | ICD-10-CM | POA: Insufficient documentation

## 2020-03-20 DIAGNOSIS — Z91040 Latex allergy status: Secondary | ICD-10-CM | POA: Diagnosis not present

## 2020-03-20 DIAGNOSIS — R000 Tachycardia, unspecified: Secondary | ICD-10-CM | POA: Insufficient documentation

## 2020-03-20 DIAGNOSIS — E1140 Type 2 diabetes mellitus with diabetic neuropathy, unspecified: Secondary | ICD-10-CM | POA: Diagnosis not present

## 2020-03-20 DIAGNOSIS — I110 Hypertensive heart disease with heart failure: Secondary | ICD-10-CM | POA: Diagnosis not present

## 2020-03-20 DIAGNOSIS — Z85850 Personal history of malignant neoplasm of thyroid: Secondary | ICD-10-CM | POA: Insufficient documentation

## 2020-03-20 DIAGNOSIS — Z79899 Other long term (current) drug therapy: Secondary | ICD-10-CM | POA: Diagnosis not present

## 2020-03-20 DIAGNOSIS — Z7952 Long term (current) use of systemic steroids: Secondary | ICD-10-CM | POA: Insufficient documentation

## 2020-03-20 DIAGNOSIS — J45909 Unspecified asthma, uncomplicated: Secondary | ICD-10-CM | POA: Insufficient documentation

## 2020-03-20 LAB — CBC
HCT: 43.7 % (ref 36.0–46.0)
Hemoglobin: 14.4 g/dL (ref 12.0–15.0)
MCH: 28.2 pg (ref 26.0–34.0)
MCHC: 33 g/dL (ref 30.0–36.0)
MCV: 85.5 fL (ref 80.0–100.0)
Platelets: 523 10*3/uL — ABNORMAL HIGH (ref 150–400)
RBC: 5.11 MIL/uL (ref 3.87–5.11)
RDW: 13 % (ref 11.5–15.5)
WBC: 15 10*3/uL — ABNORMAL HIGH (ref 4.0–10.5)
nRBC: 0 % (ref 0.0–0.2)

## 2020-03-20 LAB — URINALYSIS, ROUTINE W REFLEX MICROSCOPIC
Bacteria, UA: NONE SEEN
Bilirubin Urine: NEGATIVE
Glucose, UA: NEGATIVE mg/dL
Ketones, ur: NEGATIVE mg/dL
Leukocytes,Ua: NEGATIVE
Nitrite: NEGATIVE
Protein, ur: 30 mg/dL — AB
Specific Gravity, Urine: 1.016 (ref 1.005–1.030)
pH: 5 (ref 5.0–8.0)

## 2020-03-20 LAB — COMPREHENSIVE METABOLIC PANEL
ALT: 15 U/L (ref 0–44)
AST: 13 U/L — ABNORMAL LOW (ref 15–41)
Albumin: 4.4 g/dL (ref 3.5–5.0)
Alkaline Phosphatase: 94 U/L (ref 38–126)
Anion gap: 13 (ref 5–15)
BUN: 12 mg/dL (ref 6–20)
CO2: 29 mmol/L (ref 22–32)
Calcium: 10 mg/dL (ref 8.9–10.3)
Chloride: 100 mmol/L (ref 98–111)
Creatinine, Ser: 0.94 mg/dL (ref 0.44–1.00)
GFR, Estimated: >60 mL/min (ref >60)
Glucose, Bld: 169 mg/dL — ABNORMAL HIGH (ref 70–99)
Potassium: 4 mmol/L (ref 3.5–5.1)
Sodium: 142 mmol/L (ref 135–145)
Total Bilirubin: 0.9 mg/dL (ref 0.3–1.2)
Total Protein: 8.1 g/dL (ref 6.5–8.1)

## 2020-03-20 LAB — LIPASE, BLOOD: Lipase: 42 U/L (ref 11–51)

## 2020-03-20 LAB — POC OCCULT BLOOD, ED: Fecal Occult Bld: NEGATIVE

## 2020-03-20 MED ORDER — SODIUM CHLORIDE 0.9 % IV BOLUS
1000.0000 mL | Freq: Once | INTRAVENOUS | Status: AC
  Administered 2020-03-20: 1000 mL via INTRAVENOUS

## 2020-03-20 MED ORDER — OMEPRAZOLE 40 MG PO CPDR: 40.0000 mg | DELAYED_RELEASE_CAPSULE | Freq: Every day | ORAL | 0 refills | Status: DC

## 2020-03-20 MED ORDER — PROMETHAZINE HCL 12.5 MG RE SUPP: 12.5000 mg | Freq: Four times a day (QID) | RECTAL | 0 refills | Status: DC | PRN

## 2020-03-20 MED ORDER — MORPHINE SULFATE (PF) 4 MG/ML IV SOLN
4.0000 mg | Freq: Once | INTRAVENOUS | Status: AC
  Administered 2020-03-20: 4 mg via INTRAVENOUS
  Filled 2020-03-20: qty 1

## 2020-03-20 MED ORDER — DICYCLOMINE HCL 20 MG PO TABS: 20.0000 mg | ORAL_TABLET | Freq: Two times a day (BID) | ORAL | 0 refills | Status: DC

## 2020-03-20 MED ORDER — IOHEXOL 300 MG/ML  SOLN
100.0000 mL | Freq: Once | INTRAMUSCULAR | Status: AC | PRN
  Administered 2020-03-20: 100 mL via INTRAVENOUS

## 2020-03-20 MED ORDER — ONDANSETRON HCL 4 MG/2ML IJ SOLN
4.0000 mg | Freq: Once | INTRAMUSCULAR | Status: AC
  Administered 2020-03-20: 4 mg via INTRAVENOUS
  Filled 2020-03-20: qty 2

## 2020-03-20 NOTE — ED Provider Notes (Signed)
[facility] EMERGENCY DEPARTMENT Provider Note   CSN: [id number] Arrival date & time: 03/20/20  1010     History Chief Complaint  Patient presents with  . Abdominal Pain    Alexis [name] is a 54 y.o. female.  HPI    Patient with significant medical history of IBS, colitis, CHF, lower GI bleed, diabetes, GERD presents to the emergency department with chief complaint of worsening abdominal pain.  Patient states pain started on Saturday and has increasing gotten worse.  She endorses pain along the upper part of her stomach, she describes it as a constant dull sensation without any alleviating or aggravating factors.  she is unable to hold food or water down and has started to become weak as she has not been able to eat or drink.  She denies urinary symptoms, constipation, but states she has frequent episodes of diarrhea denies dark tarry stools or seeing blood in her stools.  She has no GI surgeries has had chronic colitis in the past where she had to be hospitalized.  She denies recent antibiotic use denies stool looking mucousy.  She denies general body aches, headaches, fevers, chills, nasal congestion, sore throat, cough, chest pain shortness of breath, is Covid vaccinated denies any recent sick contacts, dysuria or pedal edema.  Past Medical History:  Diagnosis Date  . 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 Anemia   . ASD (atrial septal defect)    per pt  . Asthma   . Bilateral dry eyes   . Cervical spondylosis    C5-6  . CHF (congestive heart failure) ([place])    not sure  . Chronic back pain   . Chronic bronchitis ([place])   . Chronic colitis   . Chronic lower GI bleeding   . Complication of anesthesia    has awakened in past during surgery/  [place]  03-08-2009 ([facility])  . Degenerative disk disease    "cervical to sacrum" (05/17/2017)  . Depression   . Diabetes mellitus without complication  ([place])   . Diabetic neuropathy, type II diabetes mellitus ([place]) 12/14/2014   not on medication  . DJD (degenerative joint disease) of knee   . Dyspnea   . Endometriosis 01/18/01  . Family history of adverse reaction to anesthesia    "mom wakes up during procedures"  . Fatty liver   . Fibromyalgia   . Gastric ulcer   . GERD (gastroesophageal reflux disease)   . Glaucoma of both eyes   . H/O hiatal hernia   . High cholesterol   . History of colon polyps   . Hypertension   . Hypothyroidism, postsurgical   . IBS (irritable bowel syndrome)   . IC (interstitial cystitis)   . Interstitial cystitis   . Lymphedema   . Migraine    "monthly" (05/17/2017)  02/06/2019- more frequently  . OSA on CPAP   . Osteoarthritis    "all over; mainly knees and back" (05/17/2017)  . PONV (postoperative nausea and vomiting)   . PTSD (post-traumatic stress disorder)   . PTSD (post-traumatic stress disorder)   . Sickle cell trait ([place])   . Sjogren's syndrome ([place])   . Thyroid cancer ([place])   . Tremor 12/14/2014   hand  . Uterine fibroid   . Varicose veins   . Vestibular dizziness   . Vocal cord paralysis, unilateral complete    "left";  POST TOTAL THYROIDECTOMY  Patient Active Problem List   Diagnosis Date Noted  . Colitis with rectal bleeding 05/16/2017  . Chronic pain   . Nausea and vomiting   . Fibromyalgia 02/21/2016  . Tremor 12/14/2014  . Diabetic neuropathy, type II diabetes mellitus ([place]) 12/14/2014  . Varicose veins of lower extremities with other complications 93/23/5573  . Leg swelling 07/25/2012  . SOB (shortness of breath) 02/22/2012  . Chronic pelvic pain in female 06/26/2000    Past Surgical History:  Procedure Laterality Date  . ACHILLES TENDON REPAIR Left 2007  . BUNIONECTOMY WITH HAMMERTOE RECONSTRUCTION AND GASTROC SLIDE Left FEB  2011   REMOVAL HARDWARE  NOV  2011  . CARDIAC CATHETERIZATION N/A 10/28/2015   Procedure: Right Heart Cath;  Surgeon: Adrian Prows, MD;   Location: [facility];  Service: Cardiovascular;  Laterality: N/A;  . COLONOSCOPY WITH ESOPHAGOGASTRODUODENOSCOPY (EGD)  MULTIPLE --  LAST ONE   NOV  2014  . CYSTO/  BILATERAL RETROGRADE PYELOGRAM/  HYDRODISTENTION/  INSTILLATION THERAPY  05-08-2003  . CYSTO/  HYDRODISTENTION/  INSTILLATION THERAPY  06-04-2005  &  03-04-2007  . CYSTO/  URETHRAL DILATION /  BILATERAL UNROOFING URETEROCELE/  BILATERAL URETERAL STENT PLACEMENT  12-01-2002  . D & C HYSTEROSCOPY/  REMOVAL IUD  12-06-2003  . DE QUERVAIN'S RELEASE Left ?09/2005  . DIAGNOSTIC LAPAROSCOPY    . DILATATION AND CURETTAGE WITH SUCTION  05-29-2005   RETAINED PLACENTA  . DILATION AND CURETTAGE OF UTERUS    . DX LAPAROSCOPY/  PELVIC BX'S/  LYSIS ADHESIONS  06-26-2000  . FOOT HARDWARE REMOVAL Left 03/2010   REMOVAL HARDWARE "related to earlier hammertoe OR"  . HYDRADENITIS EXCISION  06/29/2011   Procedure: EXCISION HYDRADENITIS AXILLA;  Surgeon: Hermelinda Dellen, MD;  Location: [facility];  Service: Plastics;;  right breast hydradenitis excioion  . HYDRADENITIS EXCISION Left 2000  . INCISION AND DRAINAGE ABSCESS ANAL  02-14-2001  . KNEE ARTHROSCOPY WITH LATERAL MENISECTOMY Right 09/08/2013   Procedure: RIGHT KNEE ARTHROSCOPY PARTIAL LATERAL MENISCECTOMY AND DEBRIDEMENT ;  Surgeon: Johnn Hai, MD;  Location: [facility];  Service: Orthopedics;  Laterality: Right;  . LAPAROSCOPY ABDOMEN DIAGNOSTIC  SEPT  2003   [place]   AND PLACEMENT IUD  . MASS EXCISION  10/29/2003   WIDE EXCISION CHEST AREA  . PILONIDAL CYST EXCISION  1983  . RADIOLOGY WITH ANESTHESIA N/A 02/07/2019   Procedure: MRI BRAIN WITHOUT IV CONTRAST; MRI CERVICAL, THORACIC, AND LUMBAR SPINE WITH AND WITHOUT CONTRAST;  Surgeon: Radiologist, Medication, MD;  Location: [facility];  Service: Radiology;  Laterality: N/A;  . RADIOLOGY WITH ANESTHESIA N/A 02/15/2020   Procedure: MRI LUMBAR WITHOUT CONTRAST;  Surgeon: Radiologist, Medication, MD;   Location: [facility];  Service: Radiology;  Laterality: N/A;  . REDUCTION MAMMAPLASTY  1997  . TEE WITHOUT CARDIOVERSION N/A 09/26/2015   Procedure: TRANSESOPHAGEAL ECHOCARDIOGRAM (TEE);  Surgeon: Adrian Prows, MD;  Location: [facility];  Service: Cardiovascular;  Laterality: N/A;  . TEMPORAL ARTERY BIOPSY / LIGATION Left 2010  . TEMPORAL ARTERY BIOPSY / LIGATION Right 06-16-2002  . TONSILLECTOMY  1990  . TOTAL THYROIDECTOMY  11/06/2008     OB History    Gravida  1   Para  1   Term      Preterm      AB      Living  1     SAB      TAB      Ectopic      Multiple  Live Births  1           Family History  Problem Relation Age of Onset  . Atrial fibrillation Mother   . Diabetes Mother   . Thyroid disease Mother   . Cushing syndrome Mother   . Tremor Mother   . Atrial fibrillation Father   . Glaucoma Father   . Tremor Sister   . Cancer Paternal Aunt        breast, colon    Social History   Tobacco Use  . Smoking status: Never Smoker  . Smokeless tobacco: Never Used  Vaping Use  . Vaping Use: Never used  Substance Use Topics  . Alcohol use: Not Currently    Comment: rare  . Drug use: No    Home Medications Prior to Admission medications   Medication Sig Start Date End Date Taking? Authorizing Provider  acetaminophen (TYLENOL) 500 MG tablet Take 1,000 mg by mouth every 6 (six) hours as needed for headache.   Yes [provider]  albuterol (PROVENTIL HFA;VENTOLIN HFA) 108 (90 BASE) MCG/ACT inhaler Inhale 2 puffs into the lungs every 4 (four) hours as needed for wheezing or shortness of breath.    Yes [provider]  atorvastatin (LIPITOR) 20 MG tablet Take 20 mg by mouth daily.   Yes [provider]  cetirizine (ZYRTEC) 10 MG chewable tablet Chew 10 mg by mouth daily.   Yes [provider]  diclofenac sodium (VOLTAREN) 1 % GEL Apply 4 g topically 4 (four) times daily as needed (pain.).  11/11/18  Yes [provider]  DULERA 200-5 MCG/ACT AERO Inhale 2 puffs into the lungs 2 (two) times daily. 05/03/17  Yes [provider]  DULoxetine (CYMBALTA) 30 MG capsule Take 60 mg by mouth at bedtime. 01/24/20  Yes [provider]  hydrOXYzine (ATARAX/VISTARIL) 50 MG tablet Take 50 mg by mouth 4 (four) times daily. 09/15/16  Yes [provider]  Lidocaine 0.5 % GEL Apply 1 application topically 3 (three) times daily as needed (pain).    Yes [provider]  LYRICA 75 MG capsule Take 75 mg by mouth 2 (two) times daily.  07/24/16  Yes [provider]  Melatonin 10 MG CAPS Take 10 mg by mouth at bedtime.    Yes [provider]  metoprolol succinate (TOPROL-XL) 25 MG 24 hr tablet Take 1 tablet (25 mg total) by mouth daily. 02/10/20  Yes Milton Ferguson, MD  ondansetron (ZOFRAN-ODT) 8 MG disintegrating tablet Take 8 mg by mouth every 8 (eight) hours as needed for nausea or vomiting.   Yes [provider]  predniSONE (DELTASONE) 20 MG tablet Take 20 mg by mouth daily.  10/11/16  Yes [provider]  promethazine (PHENERGAN) 25 MG tablet Take 25 mg by mouth every 6 (six) hours as needed for nausea or vomiting.   Yes [provider]  thyroid (ARMOUR) 120 MG tablet Take 120 mg by mouth daily before breakfast.   Yes [provider]  tiZANidine (ZANAFLEX) 4 MG tablet Take 4 mg by mouth 3 (three) times daily as needed for muscle spasms.  01/24/20  Yes [provider]  triamterene-hydrochlorothiazide (MAXZIDE-25) 37.5-25 MG tablet Take 1 tablet by mouth daily. 03/01/20  Yes [provider]  Vitamin D, Ergocalciferol, (DRISDOL) 1.25 MG (50000 UNIT) CAPS capsule Take 50,000 Units by mouth once a week. Thursday 01/24/20  Yes [provider]  dicyclomine (BENTYL) 20 MG tablet Take 1 tablet (20 mg total) by mouth  2 (two) times daily. 03/20/20   Marcello Fennel, PA-C  HYDROcodone-acetaminophen (NORCO/VICODIN) 5-325 MG  tablet Take 1 tablet by mouth every 6 (six) hours as needed for moderate pain. Patient not taking: Reported on 03/20/2020 02/10/20   Milton Ferguson, MD  omeprazole (PRILOSEC) 40 MG capsule Take 40 mg by mouth daily.    [provider]  omeprazole (PRILOSEC) 40 MG capsule Take 1 capsule (40 mg total) by mouth daily for 21 days. 03/20/20 04/10/20  Marcello Fennel, PA-C  promethazine (PHENERGAN) 12.5 MG suppository Place 1 suppository (12.5 mg total) rectally every 6 (six) hours as needed for up to 12 days for nausea or vomiting. 03/20/20 04/01/20  Marcello Fennel, PA-C  lisinopril (ZESTRIL) 20 MG tablet Take 1 tablet (20 mg total) by mouth daily. 02/10/20 02/10/20  Milton Ferguson, MD    Allergies    Food, Other, Adhesive [tape], Bactrim, Clarithromycin, Ibuprofen, Nexium [esomeprazole], Nsaids, Peg 3350-electrolytes, Sulfa antibiotics, Tisagenlecleucel, Tolmetin, and Latex  Review of Systems   Review of Systems  Constitutional: Negative for chills and fever.  HENT: Negative for congestion, tinnitus, trouble swallowing and voice change.   Eyes: Negative for photophobia.  Cardiovascular: Negative for chest pain.  Gastrointestinal: Positive for abdominal pain, diarrhea, nausea and vomiting.  Genitourinary: Negative for decreased urine volume, dysuria, enuresis, flank pain, frequency and vaginal bleeding.  Musculoskeletal: Negative for back pain.  Skin: Negative for rash.  Neurological: Negative for dizziness and headaches.  Hematological: Does not bruise/bleed easily.    Physical Exam Updated Vital Signs BP (!) 144/95 (BP Location: Right Arm)   Pulse 99   Temp 97.9 F (36.6 C) (Oral)   Resp 18   Ht $R'5\' 4"'wq$  (1.626 m)   Wt 106.1 kg   LMP 02/04/2018   SpO2 98%   BMI 40.17 kg/m   Physical Exam Vitals and nursing note reviewed. Exam conducted with a chaperone present.  Constitutional:      General: She is not in acute distress.    Appearance: She is not ill-appearing.    HENT:     Head: Normocephalic and atraumatic.     Nose: No congestion.     Mouth/Throat:     Mouth: Mucous membranes are moist.     Pharynx: Oropharynx is clear.  Eyes:     General: No scleral icterus. Cardiovascular:     Rate and Rhythm: Regular rhythm. Tachycardia present.     Pulses: Normal pulses.     Heart sounds: No murmur heard.  No friction rub. No gallop.   Pulmonary:     Effort: No respiratory distress.     Breath sounds: No wheezing, rhonchi or rales.  Abdominal:     General: There is no distension.     Tenderness: There is abdominal tenderness. There is no right CVA tenderness, left CVA tenderness or guarding.     Comments: Patient's abdomen is visualized, nondistended, normoactive bowel sounds, dull to percussion.  She had generalized abdominal tenderness, more tender in the upper quadrants, negative Murphy sign, negative rebound tenderness, no peritoneal sign noted.  Genitourinary:    Rectum: Normal. Guaiac result negative.     Comments: With chaperone present rectal exam was performed no external hemorrhoids noted, digital rectal exam performed no internal hemorrhoids noted, no stool burden, Hemoccult was negative. Musculoskeletal:        General: No swelling.     Right lower leg: No edema.     Left lower leg: No edema.  Skin:    General:  Skin is warm and dry.     Findings: No rash.  Neurological:     Mental Status: She is alert.  Psychiatric:        Mood and Affect: Mood normal.     ED Results / Procedures / Treatments   Labs (all labs ordered are listed, but only abnormal results are displayed) Labs Reviewed  COMPREHENSIVE METABOLIC PANEL - Abnormal; Notable for the following components:      Result Value   Glucose, Bld 169 (*)    AST 13 (*)    All other components within normal limits  CBC - Abnormal; Notable for the following components:   WBC 15.0 (*)    Platelets 523 (*)    All other components within normal limits  URINALYSIS, ROUTINE W REFLEX  MICROSCOPIC - Abnormal; Notable for the following components:   APPearance HAZY (*)    Hgb urine dipstick SMALL (*)    Protein, ur 30 (*)    All other components within normal limits  LIPASE, BLOOD  POC OCCULT BLOOD, ED    EKG EKG Interpretation  Date/Time:  Wednesday March 20 2020 16:05:39 EDT Ventricular Rate:  92 PR Interval:  146 QRS Duration: 78 QT Interval:  360 QTC Calculation: 445 R Axis:   42 Text Interpretation: Normal sinus rhythm Nonspecific T wave abnormality Abnormal ECG Confirmed by Madalyn Rob [id number]) on 03/20/2020 4:13:38 PM   Radiology CT Abdomen Pelvis W Contrast  Result Date: 03/20/2020 CLINICAL DATA:  Epigastric pain, acute nonlocalized abdominal pain, history diabetes mellitus, endometriosis, CHF, fatty liver, irritable bowel syndrome, interstitial cystitis, hypertension, fibroids, thyroid cancer EXAM: CT ABDOMEN AND PELVIS WITH CONTRAST TECHNIQUE: Multidetector CT imaging of the abdomen and pelvis was performed using the standard protocol following bolus administration of intravenous contrast. Sagittal and coronal MPR images reconstructed from axial data set. CONTRAST:  178mL OMNIPAQUE IOHEXOL 300 MG/ML SOLN IV. No oral contrast. COMPARISON:  05/17/2017 FINDINGS: Lower chest: Lung bases clear Hepatobiliary: Gallbladder and liver normal appearance Pancreas: Normal appearance Spleen: Normal appearance Adrenals/Urinary Tract: Small BILATERAL renal cysts, largest LEFT kidney 2.3 x 1.8 cm image 29, slightly increased. Adrenal glands, kidneys, ureters, and bladder otherwise normal appearance. Stomach/Bowel: Normal appendix. Single diverticulum at ascending colon. Stomach and bowel loops otherwise normal appearance. Vascular/Lymphatic: Vascular structures patent. No adenopathy. Few pelvic phleboliths. Reproductive: Unremarkable uterus and ovaries Other: No free air or free fluid. No hernia. Minimal stranding in fat at celiac axis origin, nonspecific. Musculoskeletal:  Degenerative disc and facet disease changes lower lumbar spine. IMPRESSION: Small BILATERAL renal cysts. Minimal nonspecific stranding of fat adjacent to celiac axis origin, nonspecific, could potentially be related to very subtle pancreatitis though pancreas is otherwise unremarkable; recommend correlation with serum lipase. Remainder of exam normal appearance. Electronically Signed   By: Lavonia Dana M.D.   On: 03/20/2020 14:42    Procedures Procedures (including critical care time)  Medications Ordered in ED Medications  morphine 4 MG/ML injection 4 mg (4 mg Intravenous Given 03/20/20 1257)  ondansetron (ZOFRAN) injection 4 mg (4 mg Intravenous Given 03/20/20 1256)  iohexol (OMNIPAQUE) 300 MG/ML solution 100 mL (100 mLs Intravenous Contrast Given 03/20/20 1357)  sodium chloride 0.9 % bolus 1,000 mL (0 mLs Intravenous Stopped 03/20/20 1556)  morphine 4 MG/ML injection 4 mg (4 mg Intravenous Given 03/20/20 1529)    ED Course  I have reviewed the triage vital signs and the nursing notes.  Pertinent labs & imaging results that were available during my care of the patient were  reviewed by me and considered in my medical decision making (see chart for details).    MDM Rules/Calculators/A&P                          Patient presents with nausea vomiting and abdominal pain.  She is alert, did not appear in acute distress, vital signs have been for tachycardia.  Will order screening labs, Hemoccult, imaging and provide pain medication and antiemetics.  CBC positive for leukocytosis 15, no signs of anemia, CMP negative for  abnormalities, no metabolic acidosis, hyperglycemia of 169, no AKI, no elevated liver enzymes, no anion gap noted.  UA negative for nitrates or leukocytes, no hematuria, no bacteria present.  Lipase 42.  Hemoccult negative CT abdomen pelvis reveals slight stranding around the pancreas correlate with lipase.  Patient was provided 1 L of fluids, antiemetics and pain meds.  She was  reassessed states she is feeling much better, states pain has subsided.  Low suspicion for systemic infection as patient is nontoxic-appearing, vital signs reassuring.  I suspect leukocytosis  is elevated likely to acute phase reactants from vomiting. Low suspicion for pancreatitis, diverticulitis, hepatic or biliary abnormalities, bowel obstruction, bowel perforation, colitis, appendicitis as there is no acute abdomen on exam, no elevation in liver enzymes or alk phos, lipase is 38 there which is baseline for patient, Hemoccult was negative, CT abdomen pelvis with no acute findings.  Low suspicion for UTI or pyelonephritis as patient denies urinary symptoms, negative CVA tenderness, UA negative for nitrates or leukocytes, no hematuria noted on exam.  Possible patient suffering from pancreatitis versus gastritis will provide her with antiemetics, Bentyl and PPIs and have her follow-up with GI doctor for further evaluation management.  Vital signs have remained stable, no indication for hospital admission.  Patient given at home care as well strict return precautions.  Patient verbalized that they understood agreed to said plan.    Final Clinical Impression(s) / ED Diagnoses Final diagnoses:  Non-intractable vomiting with nausea, unspecified vomiting type    Rx / DC Orders ED Discharge Orders         Ordered    dicyclomine (BENTYL) 20 MG tablet  2 times daily        03/20/20 1621    promethazine (PHENERGAN) 12.5 MG suppository  Every 6 hours PRN        03/20/20 1621    omeprazole (PRILOSEC) 40 MG capsule  Daily        03/20/20 1621           Marcello Fennel, PA-C 03/20/20 1704    Daleen Bo, MD 03/20/20 2201

## 2020-03-20 NOTE — Discharge Instructions (Signed)
Seen here for vomiting.  Lab work and imaging looks reassuring.  I prescribed you Phenergan suppositories, please use as needed for nausea.  Also given you Bentyl which helps with stomach cramps please take as needed.  I have also given you an acid pill please take as prescribed.  I recommend a bland diet to help with your nausea.  Please follow-up with your GI doctor for further evaluation.  Come back to the emergency department if you develop chest pain, shortness of breath, severe abdominal pain, uncontrolled nausea, vomiting, diarrhea.

## 2020-03-20 NOTE — ED Triage Notes (Signed)
Patient arrives to ED with generalized abdominal pain, chalky stools, and nausea over the past week. Pain is dull. No BRBPR. Pt has hx of chronic colitis.

## 2020-03-22 ENCOUNTER — Encounter: Payer: Self-pay | Admitting: *Deleted

## 2020-03-22 NOTE — [facility] Nurse Program (Signed)
  Dept: [facility] Nurse Program Note  Date of Encounter: 03/22/2020  Past Medical History: Past Medical History:  Diagnosis Date  . 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 Anemia   . ASD (atrial septal defect)    per pt  . Asthma   . Bilateral dry eyes   . Cervical spondylosis    C5-6  . CHF (congestive heart failure) ([place])    not sure  . Chronic back pain   . Chronic bronchitis ([place])   . Chronic colitis   . Chronic lower GI bleeding   . Complication of anesthesia    has awakened in past during surgery/  [place]  03-08-2009 ([facility])  . Degenerative disk disease    "cervical to sacrum" (05/17/2017)  . Depression   . Diabetes mellitus without complication ([place])   . Diabetic neuropathy, type II diabetes mellitus ([place]) 12/14/2014   not on medication  . DJD (degenerative joint disease) of knee   . Dyspnea   . Endometriosis 01/18/01  . Family history of adverse reaction to anesthesia    "mom wakes up during procedures"  . Fatty liver   . Fibromyalgia   . Gastric ulcer   . GERD (gastroesophageal reflux disease)   . Glaucoma of both eyes   . H/O hiatal hernia   . High cholesterol   . History of colon polyps   . Hypertension   . Hypothyroidism, postsurgical   . IBS (irritable bowel syndrome)   . IC (interstitial cystitis)   . Interstitial cystitis   . Lymphedema   . Migraine    "monthly" (05/17/2017)  02/06/2019- more frequently  . OSA on CPAP   . Osteoarthritis    "all over; mainly knees and back" (05/17/2017)  . PONV (postoperative nausea and vomiting)   . PTSD (post-traumatic stress disorder)   . PTSD (post-traumatic stress disorder)   . Sickle cell trait ([place])   . Sjogren's syndrome ([place])   . Thyroid cancer ([place])   . Tremor 12/14/2014   hand  . Uterine fibroid   . Varicose veins   . Vestibular dizziness   . Vocal cord paralysis, unilateral complete    "left";  POST TOTAL  THYROIDECTOMY    Encounter Details:  CNP Questionnaire - 03/22/20 1451      Questionnaire   Do you give verbal consent to treat you today? Yes    Visit Setting Church or Organization    Location Patient Served At [facility]    Patient Status Homeless    Medical Provider Yes    Insurance Medicaid    Intervention Support    Housing/Utilities No permanent housing    Referrals Other          Met with client and CM per request at [facility] to discuss treatment plan and identify any barriers. Client currently has medical procedures scheduled for next week and has a PCP. She reports she is seeing a therapist weekly and has been established more that five years. She is asking for a letter from her therapist for the shelter stating she has a mental health therapist and sees them regularly. Pt is working with a Human resources officer on housing and working to establish disability.  Gloria Ricardo W RN CN [phone]
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
## 2020-06-05 NOTE — Congregational Nurse Program (Signed)
Dept: [facility] Nurse Program Note  Date of Encounter: 06/04/2020   Client resting in her room when nurse arrived.  Client met nurse in the sun room to follow up with client from last week.  Client was supposed to be discharged from shelter however time was extended due to no permanent placement yet.  Client reports that now she does have something but it will not be ready until Feb. 1-15. Time at shelter will be up prior to that time per Health visitor. Client report she does have a Plan B as she has completed applications for other places as well. Medical complaint today is continued issues with joint pain (back, and hips) Client attends appointments at [facility] and sees [facility].  States that she's waiting for them to make her another referral to a different Orthopedic doctor regarding her hip pain.  Client reports little relief of pain with current medications, but realizes that she needs to have surgery to get more relief.  Reports that arthritis runs in her family, her sister and mother have both had various joint surgeries.  Nurse reviewed client medication list, client reports needing refills on Lipitor and Cymbalta and plans to call Pharmacy today.  In addition to meds listed client also takes Oxycodone 30 mg daily. Plan:  Client to follow up with Ortho referral, continue taking meds, using ice and heat doing exercises. Client to use Medicaid transportation for appointments to avoid missing appointments  Due to car breaking down. Will check in with nurse next Tuesday as needed.  Mccade Sullenberger D. Joneen Caraway MSN, Advice worker Nurse Program [phone]  Past Medical History: Past Medical History:  Diagnosis Date  . 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 Anemia   . ASD (atrial septal defect)    per pt  . Asthma   . Bilateral dry eyes   . Cervical spondylosis    C5-6  . CHF (congestive heart failure) ([place])    not sure  .  Chronic back pain   . Chronic bronchitis ([place])   . Chronic colitis   . Chronic lower GI bleeding   . Complication of anesthesia    has awakened in past during surgery/  [place]  03-08-2009 ([facility])  . Degenerative disk disease    "cervical to sacrum" (05/17/2017)  . Depression   . Diabetes mellitus without complication ([place])   . Diabetic neuropathy, type II diabetes mellitus ([place]) 12/14/2014   not on medication  . DJD (degenerative joint disease) of knee   . Dyspnea   . Endometriosis 01/18/01  . Family history of adverse reaction to anesthesia    "mom wakes up during procedures"  . Fatty liver   . Fibromyalgia   . Gastric ulcer   . GERD (gastroesophageal reflux disease)   . Glaucoma of both eyes   . H/O hiatal hernia   . High cholesterol   . History of colon polyps   . Hypertension   . Hypothyroidism, postsurgical   . IBS (irritable bowel syndrome)   . IC (interstitial cystitis)   . Interstitial cystitis   . Lymphedema   . Migraine    "monthly" (05/17/2017)  02/06/2019- more frequently  . OSA on CPAP   . Osteoarthritis    "all over; mainly knees and back" (05/17/2017)  . PONV (postoperative nausea and vomiting)   . PTSD (post-traumatic stress disorder)   . PTSD (post-traumatic stress disorder)   .  Sickle cell trait ([place])   . Sjogren's syndrome ([place])   . Thyroid cancer ([place])   . Tremor 12/14/2014   hand  . Uterine fibroid   . Varicose veins   . Vestibular dizziness   . Vocal cord paralysis, unilateral complete    "left";  POST TOTAL THYROIDECTOMY    Encounter Details:  CNP Questionnaire - 06/04/20 1600      Questionnaire   Location Patient Served At [facility]    Patient Status Homeless    Medical Provider Yes    Insurance Medicaid    Intervention Educate;Spiritual Care;Support    Housing/Utilities No permanent housing    Transportation Provided transportation assistance ([facility] transp,bus pass, taxi voucher, etc.)     Interpersonal Safety --   N/A   Food --   N/A   Medication Provided medication assistance (Pharmacies, drug rep, etc.)    Referrals Vaccination (Covid, Flu, Pneumonia, Shingles)    Screening Referrals --   Patient to reach out to Orthopedic doctor for 2nd opinion   ED Visit Averted --   N/A   Life-Saving Intervention Made --   Strongly recommended resuming CPAP use

## 2020-06-11 ENCOUNTER — Emergency Department (HOSPITAL_COMMUNITY)
Admission: EM | Admit: 2020-06-11 | Discharge: 2020-06-11 | Disposition: A | Discharge status: Home / Self Care | Payer: Medicaid Other | Attending: Emergency Medicine | Admitting: Emergency Medicine

## 2020-06-11 ENCOUNTER — Other Ambulatory Visit: Payer: Self-pay

## 2020-06-11 ENCOUNTER — Encounter (HOSPITAL_COMMUNITY): Payer: Self-pay | Admitting: *Deleted

## 2020-06-11 DIAGNOSIS — Z5321 Procedure and treatment not carried out due to patient leaving prior to being seen by health care provider: Secondary | ICD-10-CM | POA: Diagnosis not present

## 2020-06-11 DIAGNOSIS — R197 Diarrhea, unspecified: Secondary | ICD-10-CM | POA: Diagnosis not present

## 2020-06-11 DIAGNOSIS — R1030 Lower abdominal pain, unspecified: Secondary | ICD-10-CM | POA: Insufficient documentation

## 2020-06-11 DIAGNOSIS — R110 Nausea: Secondary | ICD-10-CM | POA: Diagnosis not present

## 2020-06-11 LAB — COMPREHENSIVE METABOLIC PANEL
ALT: 16 U/L (ref 0–44)
AST: 14 U/L — ABNORMAL LOW (ref 15–41)
Albumin: 4.2 g/dL (ref 3.5–5.0)
Alkaline Phosphatase: 75 U/L (ref 38–126)
Anion gap: 13 (ref 5–15)
BUN: 13 mg/dL (ref 6–20)
CO2: 28 mmol/L (ref 22–32)
Calcium: 9.8 mg/dL (ref 8.9–10.3)
Chloride: 103 mmol/L (ref 98–111)
Creatinine, Ser: 0.9 mg/dL (ref 0.44–1.00)
GFR, Estimated: >60 mL/min (ref >60)
Glucose, Bld: 135 mg/dL — ABNORMAL HIGH (ref 70–99)
Potassium: 3.6 mmol/L (ref 3.5–5.1)
Sodium: 144 mmol/L (ref 135–145)
Total Bilirubin: 1 mg/dL (ref 0.3–1.2)
Total Protein: 7.8 g/dL (ref 6.5–8.1)

## 2020-06-11 LAB — CBC
HCT: 42.1 % (ref 36.0–46.0)
Hemoglobin: 14.2 g/dL (ref 12.0–15.0)
MCH: 29.1 pg (ref 26.0–34.0)
MCHC: 33.7 g/dL (ref 30.0–36.0)
MCV: 86.3 fL (ref 80.0–100.0)
Platelets: 356 10*3/uL (ref 150–400)
RBC: 4.88 MIL/uL (ref 3.87–5.11)
RDW: 14.7 % (ref 11.5–15.5)
WBC: 16.3 10*3/uL — ABNORMAL HIGH (ref 4.0–10.5)
nRBC: 0 % (ref 0.0–0.2)

## 2020-06-11 LAB — I-STAT BETA HCG BLOOD, ED (MC, WL, AP ONLY): I-stat hCG, quantitative: <5 m[IU]/mL (ref <5)

## 2020-06-11 LAB — LIPASE, BLOOD: Lipase: 29 U/L (ref 11–51)

## 2020-06-11 NOTE — ED Triage Notes (Signed)
Pt is here lower abdominal pain and reports history of colitis.  Pt states diarrhea and nausea.  Pt states started over a week.

## 2020-06-11 NOTE — ED Notes (Addendum)
Patient called x2 for vitals recheck with no response  

## 2020-06-12 ENCOUNTER — Encounter (HOSPITAL_COMMUNITY): Payer: Self-pay | Admitting: *Deleted

## 2020-06-12 ENCOUNTER — Emergency Department (HOSPITAL_COMMUNITY): Payer: Medicaid Other

## 2020-06-12 ENCOUNTER — Emergency Department (HOSPITAL_COMMUNITY)
Admission: EM | Admit: 2020-06-12 | Discharge: 2020-06-12 | Disposition: A | Discharge status: Home / Self Care | Payer: Medicaid Other | Attending: Emergency Medicine | Admitting: Emergency Medicine

## 2020-06-12 ENCOUNTER — Other Ambulatory Visit: Payer: Self-pay

## 2020-06-12 DIAGNOSIS — Z955 Presence of coronary angioplasty implant and graft: Secondary | ICD-10-CM | POA: Insufficient documentation

## 2020-06-12 DIAGNOSIS — E1140 Type 2 diabetes mellitus with diabetic neuropathy, unspecified: Secondary | ICD-10-CM | POA: Diagnosis not present

## 2020-06-12 DIAGNOSIS — R197 Diarrhea, unspecified: Secondary | ICD-10-CM | POA: Diagnosis not present

## 2020-06-12 DIAGNOSIS — Z79899 Other long term (current) drug therapy: Secondary | ICD-10-CM | POA: Diagnosis not present

## 2020-06-12 DIAGNOSIS — K219 Gastro-esophageal reflux disease without esophagitis: Secondary | ICD-10-CM | POA: Diagnosis not present

## 2020-06-12 DIAGNOSIS — I110 Hypertensive heart disease with heart failure: Secondary | ICD-10-CM | POA: Diagnosis not present

## 2020-06-12 DIAGNOSIS — R1084 Generalized abdominal pain: Secondary | ICD-10-CM

## 2020-06-12 DIAGNOSIS — R519 Headache, unspecified: Secondary | ICD-10-CM | POA: Insufficient documentation

## 2020-06-12 DIAGNOSIS — I509 Heart failure, unspecified: Secondary | ICD-10-CM | POA: Diagnosis not present

## 2020-06-12 DIAGNOSIS — R110 Nausea: Secondary | ICD-10-CM | POA: Insufficient documentation

## 2020-06-12 DIAGNOSIS — Z91040 Latex allergy status: Secondary | ICD-10-CM | POA: Diagnosis not present

## 2020-06-12 DIAGNOSIS — E039 Hypothyroidism, unspecified: Secondary | ICD-10-CM | POA: Insufficient documentation

## 2020-06-12 DIAGNOSIS — Z85850 Personal history of malignant neoplasm of thyroid: Secondary | ICD-10-CM | POA: Insufficient documentation

## 2020-06-12 DIAGNOSIS — J45909 Unspecified asthma, uncomplicated: Secondary | ICD-10-CM | POA: Diagnosis not present

## 2020-06-12 DIAGNOSIS — R109 Unspecified abdominal pain: Secondary | ICD-10-CM | POA: Diagnosis present

## 2020-06-12 DIAGNOSIS — Z20822 Contact with and (suspected) exposure to covid-19: Secondary | ICD-10-CM | POA: Diagnosis not present

## 2020-06-12 LAB — COMPREHENSIVE METABOLIC PANEL
ALT: 15 U/L (ref 0–44)
AST: 15 U/L (ref 15–41)
Albumin: 4.4 g/dL (ref 3.5–5.0)
Alkaline Phosphatase: 76 U/L (ref 38–126)
Anion gap: 14 (ref 5–15)
BUN: 15 mg/dL (ref 6–20)
CO2: 26 mmol/L (ref 22–32)
Calcium: 9.4 mg/dL (ref 8.9–10.3)
Chloride: 102 mmol/L (ref 98–111)
Creatinine, Ser: 0.79 mg/dL (ref 0.44–1.00)
GFR, Estimated: >60 mL/min (ref >60)
Glucose, Bld: 120 mg/dL — ABNORMAL HIGH (ref 70–99)
Potassium: 3 mmol/L — ABNORMAL LOW (ref 3.5–5.1)
Sodium: 142 mmol/L (ref 135–145)
Total Bilirubin: 0.7 mg/dL (ref 0.3–1.2)
Total Protein: 8 g/dL (ref 6.5–8.1)

## 2020-06-12 LAB — I-STAT BETA HCG BLOOD, ED (MC, WL, AP ONLY): I-stat hCG, quantitative: 5.3 m[IU]/mL — ABNORMAL HIGH (ref <5)

## 2020-06-12 LAB — URINALYSIS, ROUTINE W REFLEX MICROSCOPIC
Bacteria, UA: NONE SEEN
Bilirubin Urine: NEGATIVE
Glucose, UA: NEGATIVE mg/dL
Ketones, ur: 20 mg/dL — AB
Leukocytes,Ua: NEGATIVE
Nitrite: NEGATIVE
Protein, ur: NEGATIVE mg/dL
Specific Gravity, Urine: >1.046 — ABNORMAL HIGH (ref 1.005–1.030)
pH: 6 (ref 5.0–8.0)

## 2020-06-12 LAB — CBC
HCT: 42.4 % (ref 36.0–46.0)
Hemoglobin: 14.4 g/dL (ref 12.0–15.0)
MCH: 29.1 pg (ref 26.0–34.0)
MCHC: 34 g/dL (ref 30.0–36.0)
MCV: 85.8 fL (ref 80.0–100.0)
Platelets: 364 10*3/uL (ref 150–400)
RBC: 4.94 MIL/uL (ref 3.87–5.11)
RDW: 14.4 % (ref 11.5–15.5)
WBC: 15.8 10*3/uL — ABNORMAL HIGH (ref 4.0–10.5)
nRBC: 0 % (ref 0.0–0.2)

## 2020-06-12 LAB — LIPASE, BLOOD: Lipase: 33 U/L (ref 11–51)

## 2020-06-12 MED ORDER — ONDANSETRON HCL 4 MG/2ML IJ SOLN
4.0000 mg | Freq: Once | INTRAMUSCULAR | Status: AC
  Administered 2020-06-12: 4 mg via INTRAVENOUS
  Filled 2020-06-12: qty 2

## 2020-06-12 MED ORDER — SODIUM CHLORIDE 0.9 % IV BOLUS
1000.0000 mL | Freq: Once | INTRAVENOUS | Status: AC
  Administered 2020-06-12: 1000 mL via INTRAVENOUS

## 2020-06-12 MED ORDER — HYDROMORPHONE HCL 1 MG/ML IJ SOLN
1.0000 mg | Freq: Once | INTRAMUSCULAR | Status: AC
  Administered 2020-06-12: 1 mg via INTRAVENOUS
  Filled 2020-06-12: qty 1

## 2020-06-12 MED ORDER — POTASSIUM CHLORIDE 10 MEQ/100ML IV SOLN
10.0000 meq | INTRAVENOUS | Status: AC
  Administered 2020-06-12 (×2): 10 meq via INTRAVENOUS
  Filled 2020-06-12 (×2): qty 100

## 2020-06-12 MED ORDER — MORPHINE SULFATE (PF) 4 MG/ML IV SOLN
4.0000 mg | Freq: Once | INTRAVENOUS | Status: AC
  Administered 2020-06-12: 4 mg via INTRAVENOUS
  Filled 2020-06-12: qty 1

## 2020-06-12 MED ORDER — IOHEXOL 300 MG/ML  SOLN
100.0000 mL | Freq: Once | INTRAMUSCULAR | Status: AC | PRN
  Administered 2020-06-12: 100 mL via INTRAVENOUS

## 2020-06-12 NOTE — ED Provider Notes (Signed)
[facility] DEPT Provider Note   CSN: [id number] Arrival date & time: 06/12/20  0945     History Chief Complaint  Patient presents with  . Abdominal Pain  . Headache  . Nausea  . Diarrhea    Alexis [name] is a 55 y.o. female with pertinent past medical history of chronic colitis, lower GI bleed, diabetes, endometriosis, IBS, interstitial cystitis, Sojourn syndrome, sickle cell trait presents emergency  department today for abdominal pain, nausea and diarrhea for the past 10 days.  Patient states that she has had generalized abdominal pain for the past 10 days, states that she has not been able to tolerate p.o. due to her nausea.  Denies any vomiting.  States that she has had multiple rounds of diarrhea daily.  No hematochezia.  Denies any recent antibiotics.  No recent hospital admissions.  Patient states that she is currently homeless, living in shelter.  States that it is hard to manage her symptoms when she is having multiple rounds of diarrhea.  No sick contacts.  Denies any fevers or chills.  Denies any chest pain or shortness of breath.  Denies any back pain.  Has been vaccinated against Covid.  Patient states that while she has been here she has started to develop a headache.  Is frontal.  Is benign.  No vision changes, head trauma.  Has tried Bentyl without much relief.  Denies any urinary symptoms.  Denies any vaginal symptoms.  Abdominal pain does not radiate anywhere, stays in her abdomen.  Is intermittent.  It is worsened with food.  No other complaints at this time.     HPI     Past Medical History:  Diagnosis Date  . 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 Anemia   . ASD (atrial septal defect)    per pt  . Asthma   . Bilateral dry eyes   . Cervical spondylosis    C5-6  . CHF (congestive heart failure) ([place])    not sure  . Chronic back pain   . Chronic bronchitis ([place])   . Chronic colitis   . Chronic lower GI  bleeding   . Complication of anesthesia    has awakened in past during surgery/  [place]  03-08-2009 ([facility])  . Degenerative disk disease    "cervical to sacrum" (05/17/2017)  . Depression   . Diabetes mellitus without complication ([place])   . Diabetic neuropathy, type II diabetes mellitus ([place]) 12/14/2014   not on medication  . DJD (degenerative joint disease) of knee   . Dyspnea   . Endometriosis 01/18/01  . Family history of adverse reaction to anesthesia    "mom wakes up during procedures"  . Fatty liver   . Fibromyalgia   . Gastric ulcer   . GERD (gastroesophageal reflux disease)   . Glaucoma of both eyes   . H/O hiatal hernia   . High cholesterol   . History of colon polyps   . Hypertension   . Hypothyroidism, postsurgical   . IBS (irritable bowel syndrome)   . IC (interstitial cystitis)   . Interstitial cystitis   . Lymphedema   . Migraine    "monthly" (05/17/2017)  02/06/2019- more frequently  . OSA on CPAP   . Osteoarthritis    "all over; mainly knees and back" (05/17/2017)  . PONV (postoperative nausea and vomiting)   . PTSD (post-traumatic stress disorder)   .  PTSD (post-traumatic stress disorder)   . Sickle cell trait ([place])   . Sjogren's syndrome ([place])   . Thyroid cancer ([place])   . Tremor 12/14/2014   hand  . Uterine fibroid   . Varicose veins   . Vestibular dizziness   . Vocal cord paralysis, unilateral complete    "left";  POST TOTAL THYROIDECTOMY    Patient Active Problem List   Diagnosis Date Noted  . Colitis with rectal bleeding 05/16/2017  . Chronic pain   . Nausea and vomiting   . Fibromyalgia 02/21/2016  . Tremor 12/14/2014  . Diabetic neuropathy, type II diabetes mellitus ([place]) 12/14/2014  . Varicose veins of lower extremities with other complications [id number]  . Leg swelling 07/25/2012  . SOB (shortness of breath) 02/22/2012  . Chronic pelvic pain in female 06/26/2000    Past Surgical  History:  Procedure Laterality Date  . ACHILLES TENDON REPAIR Left 2007  . BUNIONECTOMY WITH HAMMERTOE RECONSTRUCTION AND GASTROC SLIDE Left FEB  2011   REMOVAL HARDWARE  NOV  2011  . CARDIAC CATHETERIZATION N/A 10/28/2015   Procedure: Right Heart Cath;  Surgeon: Adrian Prows, MD;  Location: [facility];  Service: Cardiovascular;  Laterality: N/A;  . COLONOSCOPY WITH ESOPHAGOGASTRODUODENOSCOPY (EGD)  MULTIPLE --  LAST ONE   NOV  2014  . CYSTO/  BILATERAL RETROGRADE PYELOGRAM/  HYDRODISTENTION/  INSTILLATION THERAPY  05-08-2003  . CYSTO/  HYDRODISTENTION/  INSTILLATION THERAPY  06-04-2005  &  03-04-2007  . CYSTO/  URETHRAL DILATION /  BILATERAL UNROOFING URETEROCELE/  BILATERAL URETERAL STENT PLACEMENT  12-01-2002  . D & C HYSTEROSCOPY/  REMOVAL IUD  12-06-2003  . DE QUERVAIN'S RELEASE Left ?09/2005  . DIAGNOSTIC LAPAROSCOPY    . DILATATION AND CURETTAGE WITH SUCTION  05-29-2005   RETAINED PLACENTA  . DILATION AND CURETTAGE OF UTERUS    . DX LAPAROSCOPY/  PELVIC BX'S/  LYSIS ADHESIONS  06-26-2000  . FOOT HARDWARE REMOVAL Left 03/2010   REMOVAL HARDWARE "related to earlier hammertoe OR"  . HYDRADENITIS EXCISION  06/29/2011   Procedure: EXCISION HYDRADENITIS AXILLA;  Surgeon: Hermelinda Dellen, MD;  Location: [facility];  Service: Plastics;;  right breast hydradenitis excioion  . HYDRADENITIS EXCISION Left 2000  . INCISION AND DRAINAGE ABSCESS ANAL  02-14-2001  . KNEE ARTHROSCOPY WITH LATERAL MENISECTOMY Right 09/08/2013   Procedure: RIGHT KNEE ARTHROSCOPY PARTIAL LATERAL MENISCECTOMY AND DEBRIDEMENT ;  Surgeon: Johnn Hai, MD;  Location: [facility];  Service: Orthopedics;  Laterality: Right;  . LAPAROSCOPY ABDOMEN DIAGNOSTIC  SEPT  2003   [place]   AND PLACEMENT IUD  . MASS EXCISION  10/29/2003   WIDE EXCISION CHEST AREA  . PILONIDAL CYST EXCISION  1983  . RADIOLOGY WITH ANESTHESIA N/A 02/07/2019   Procedure: MRI BRAIN WITHOUT IV CONTRAST; MRI  CERVICAL, THORACIC, AND LUMBAR SPINE WITH AND WITHOUT CONTRAST;  Surgeon: Radiologist, Medication, MD;  Location: [facility];  Service: Radiology;  Laterality: N/A;  . RADIOLOGY WITH ANESTHESIA N/A 02/15/2020   Procedure: MRI LUMBAR WITHOUT CONTRAST;  Surgeon: Radiologist, Medication, MD;  Location: [facility];  Service: Radiology;  Laterality: N/A;  . REDUCTION MAMMAPLASTY  1997  . TEE WITHOUT CARDIOVERSION N/A 09/26/2015   Procedure: TRANSESOPHAGEAL ECHOCARDIOGRAM (TEE);  Surgeon: Adrian Prows, MD;  Location: [facility];  Service: Cardiovascular;  Laterality: N/A;  . TEMPORAL ARTERY BIOPSY / LIGATION Left 2010  . TEMPORAL ARTERY BIOPSY / LIGATION Right 06-16-2002  . TONSILLECTOMY  1990  . TOTAL THYROIDECTOMY  11/06/2008     OB History    Gravida  1   Para  1   Term      Preterm      AB      Living  1     SAB      IAB      Ectopic      Multiple      Live Births  1           Family History  Problem Relation Age of Onset  . Atrial fibrillation Mother   . Diabetes Mother   . Thyroid disease Mother   . Cushing syndrome Mother   . Tremor Mother   . Atrial fibrillation Father   . Glaucoma Father   . Tremor Sister   . Cancer Paternal Aunt        breast, colon    Social History   Tobacco Use  . Smoking status: Never Smoker  . Smokeless tobacco: Never Used  Vaping Use  . Vaping Use: Never used  Substance Use Topics  . Alcohol use: Not Currently    Comment: rare  . Drug use: No    Home Medications Prior to Admission medications   Medication Sig Start Date End Date Taking? Authorizing Provider  acetaminophen (TYLENOL) 500 MG tablet Take 1,000 mg by mouth every 6 (six) hours as needed for headache.   Yes [provider]  albuterol (PROVENTIL HFA;VENTOLIN HFA) 108 (90 BASE) MCG/ACT inhaler Inhale 2 puffs into the lungs every 4 (four) hours as needed for wheezing or shortness of breath.    Yes [provider]  atorvastatin (LIPITOR) 20 MG tablet Take  20 mg by mouth daily.   Yes [provider]  cetirizine (ZYRTEC) 10 MG chewable tablet Chew 10 mg by mouth daily.   Yes [provider]  diclofenac sodium (VOLTAREN) 1 % GEL Apply 4 g topically 4 (four) times daily as needed (pain.).  11/11/18  Yes [provider]  dicyclomine (BENTYL) 20 MG tablet Take 1 tablet (20 mg total) by mouth 2 (two) times daily. 03/20/20  Yes Marcello Fennel, PA-C  DULERA 200-5 MCG/ACT AERO Inhale 2 puffs into the lungs 2 (two) times daily. 05/03/17  Yes [provider]  DULoxetine (CYMBALTA) 30 MG capsule Take 60 mg by mouth at bedtime. 01/24/20  Yes [provider]  hydrOXYzine (ATARAX/VISTARIL) 50 MG tablet Take 50 mg by mouth 4 (four) times daily. 09/15/16  Yes [provider]  Lidocaine 0.5 % GEL Apply 1 application topically 3 (three) times daily as needed (pain).    Yes [provider]  LYRICA 75 MG capsule Take 75 mg by mouth 2 (two) times daily.  07/24/16  Yes [provider]  Melatonin 10 MG CAPS Take 10 mg by mouth at bedtime.    Yes [provider]  omeprazole (PRILOSEC) 40 MG capsule Take 40 mg by mouth daily.   Yes [provider]  ondansetron (ZOFRAN-ODT) 8 MG disintegrating tablet Take 8 mg by mouth every 8 (eight) hours as needed for nausea or vomiting.   Yes [provider]  oxycodone (ROXICODONE) 30 MG immediate release tablet Take 30 mg by mouth every 4 (four) hours as needed for pain.   Yes [provider]  predniSONE (DELTASONE) 20 MG tablet Take 20 mg by mouth daily.  10/11/16  Yes [provider]  promethazine (PHENERGAN) 12.5 MG suppository Place 1 suppository (12.5 mg total) rectally every 6 (six) hours  as needed for up to 12 days for nausea or vomiting. 03/20/20 04/01/20 Yes Marcello Fennel, PA-C  promethazine (PHENERGAN) 25 MG tablet Take 25 mg by mouth every 6 (six) hours as needed for nausea or vomiting.   Yes [provider]  thyroid (ARMOUR) 120 MG tablet Take 120 mg by mouth daily before breakfast.   Yes [provider]  tiZANidine (ZANAFLEX) 4 MG tablet Take 4 mg by mouth 3 (three) times daily as needed for muscle spasms.  01/24/20  Yes [provider]  triamterene-hydrochlorothiazide (MAXZIDE-25) 37.5-25 MG tablet Take 1 tablet by mouth daily. 03/01/20  Yes [provider]  Vitamin D, Ergocalciferol, (DRISDOL) 1.25 MG (50000 UNIT) CAPS capsule Take 50,000 Units by mouth once a week. Tuesday 01/24/20  Yes [provider]  HYDROcodone-acetaminophen (NORCO/VICODIN) 5-325 MG tablet Take 1 tablet by mouth every 6 (six) hours as needed for moderate pain. Patient not taking: No sig reported 02/10/20   Milton Ferguson, MD  metoprolol succinate (TOPROL-XL) 25 MG 24 hr tablet Take 1 tablet (25 mg total) by mouth daily. Patient not taking: Reported on 06/12/2020 02/10/20   Milton Ferguson, MD  omeprazole (PRILOSEC) 40 MG capsule Take 1 capsule (40 mg total) by mouth daily for 21 days. Patient not taking: Reported on 06/12/2020 03/20/20 04/10/20  Marcello Fennel, PA-C  lisinopril (ZESTRIL) 20 MG tablet Take 1 tablet (20 mg total) by mouth daily. 02/10/20 02/10/20  Milton Ferguson, MD    Allergies    Food, Other, Adhesive [tape], Bactrim, Clarithromycin, Ibuprofen, Nexium [esomeprazole], Nsaids, Peg 3350-electrolytes, Sulfa antibiotics, Tisagenlecleucel, Tolmetin, and Latex  Review of Systems   Review of Systems  Constitutional: Negative for chills, diaphoresis, fatigue and fever.  HENT: Negative for congestion, sore throat and trouble swallowing.   Eyes: Negative for pain and visual disturbance.  Respiratory: Negative for cough, shortness of breath and wheezing.   Cardiovascular: Negative for chest pain, palpitations and leg swelling.  Gastrointestinal: Positive for abdominal pain, diarrhea and nausea. Negative for abdominal distention, blood in stool and vomiting.  Genitourinary: Negative  for difficulty urinating.  Musculoskeletal: Negative for back pain, neck pain and neck stiffness.  Skin: Negative for pallor.  Neurological: Positive for headaches. Negative for dizziness, speech difficulty and weakness.  Psychiatric/Behavioral: Negative for confusion.    Physical Exam Updated Vital Signs BP 130/87 (BP Location: Left Arm)   Pulse 90   Temp 98.6 F (37 C) (Oral)   Resp 17   Ht 5\' 4"  (1.626 m)   Wt 104.3 kg   LMP 02/04/2018   SpO2 97%   BMI 39.48 kg/m   Physical Exam Constitutional:      General: She is not in acute distress.    Appearance: Normal appearance. She is not ill-appearing, toxic-appearing or diaphoretic.  HENT:     Head: Normocephalic and atraumatic.     Mouth/Throat:     Mouth: Mucous membranes are moist.     Pharynx: Oropharynx is clear.  Eyes:     General: No scleral icterus.    Extraocular Movements: Extraocular movements intact.     Pupils: Pupils are equal, round, and reactive to light.  Cardiovascular:     Rate and Rhythm: Normal rate and regular rhythm.     Pulses: Normal pulses.     Heart sounds: Normal heart sounds.  Pulmonary:     Effort: Pulmonary effort is normal. No respiratory distress.     Breath sounds: Normal breath sounds. No stridor. No wheezing, rhonchi or rales.  Chest:     Chest wall: No tenderness.  Abdominal:     General: Abdomen is flat. There is no distension.     Palpations: Abdomen is soft.     Tenderness: There is generalized abdominal tenderness. There is no guarding or rebound.  Musculoskeletal:        General: No swelling or tenderness. Normal range of motion.     Cervical back: Normal range of motion and neck supple. No rigidity.     Right lower leg: No edema.     Left lower leg: No edema.  Skin:    General: Skin is warm and dry.     Capillary Refill: Capillary refill takes less than 2 seconds.     Coloration: Skin is not pale.  Neurological:     General: No focal deficit present.     Mental Status:  She is alert and oriented to person, place, and time.     Cranial Nerves: No cranial nerve deficit.     Sensory: No sensory deficit.     Motor: No weakness.     Coordination: Coordination normal.  Psychiatric:        Mood and Affect: Mood normal.        Behavior: Behavior normal.     ED Results / Procedures / Treatments   Labs (all labs ordered are listed, but only abnormal results are displayed) Labs Reviewed  COMPREHENSIVE METABOLIC PANEL - Abnormal; Notable for the following components:      Result Value   Potassium 3.0 (*)    Glucose, Bld 120 (*)    All other components within normal limits  CBC - Abnormal; Notable for the following components:   WBC 15.8 (*)    All other components within normal limits  I-STAT BETA HCG BLOOD, ED ([facility], [facility], [facility] ONLY) - Abnormal; Notable for the following components:   I-stat hCG, quantitative 5.3 (*)    All other components within normal limits  GASTROINTESTINAL PANEL BY PCR, STOOL (REPLACES STOOL CULTURE)  C DIFFICILE (CDIFF) QUICK SCRN (NO PCR REFLEX)  SARS CORONAVIRUS 2 (TAT 6-24 HRS)  LIPASE, BLOOD  URINALYSIS, ROUTINE W REFLEX MICROSCOPIC    EKG None  Radiology CT Abdomen Pelvis W Contrast  Result Date: 06/12/2020 CLINICAL DATA:  Abdominal pain, nausea, diarrhea X 10 days EXAM: CT ABDOMEN AND PELVIS WITH CONTRAST TECHNIQUE: Multidetector CT imaging of the abdomen and pelvis was performed using the standard protocol following bolus administration of intravenous contrast. CONTRAST:  168mL OMNIPAQUE IOHEXOL 300 MG/ML  SOLN COMPARISON:  03/20/2020 and previous FINDINGS: Lower chest: No acute abnormality. Hepatobiliary: No focal liver abnormality is seen. No gallstones, gallbladder wall thickening, or biliary dilatation. Pancreas: Unremarkable. No pancreatic ductal dilatation or surrounding inflammatory changes. Spleen: Normal in size without focal abnormality. Adrenals/Urinary Tract: Adrenal glands unremarkable. 2 cm left renal cyst stable.  No hydronephrosis. Urinary bladder incompletely distended. Stomach/Bowel: Stomach is nondistended. Small bowel decompressed. Normal appendix. The colon is nondilated with a few scattered diverticula; no adjacent inflammatory/edematous change or wall thickening. Vascular/Lymphatic: No significant vascular findings are present. No enlarged abdominal or pelvic lymph nodes. Reproductive: Uterus and bilateral adnexa are unremarkable. Other: Bilateral pelvic phleboliths.  No ascites.  No free air. Musculoskeletal: Advanced facet DJD L4-5 allows early grade 1 anterolisthesis. Facet and degenerative disc disease L5-S1. Negative for fracture or worrisome bone lesion. IMPRESSION: 1. No acute findings. 2. Scattered colonic diverticula. 3. Lower lumbar spondylitic changes. Electronically Signed   By: Lucrezia Europe M.D.   On:  06/12/2020 13:52    Procedures Procedures   Medications Ordered in ED Medications  potassium chloride 10 mEq in 100 mL IVPB (10 mEq Intravenous New Bag/Given 06/12/20 1504)  HYDROmorphone (DILAUDID) injection 1 mg (has no administration in time range)  sodium chloride 0.9 % bolus 1,000 mL (1,000 mLs Intravenous New Bag/Given 06/12/20 1308)  morphine 4 MG/ML injection 4 mg (4 mg Intravenous Given 06/12/20 1307)  ondansetron (ZOFRAN) injection 4 mg (4 mg Intravenous Given 06/12/20 1307)  iohexol (OMNIPAQUE) 300 MG/ML solution 100 mL (100 mLs Intravenous Contrast Given 06/12/20 1337)    ED Course  I have reviewed the triage vital signs and the nursing notes.  Pertinent labs & imaging results that were available during my care of the patient were reviewed by me and considered in my medical decision making (see chart for details).    MDM Rules/Calculators/A&P                          LENEISHA KULAGA is a 55 y.o. female with pertinent past medical history of chronic colitis, lower GI bleed, diabetes, endometriosis, IBS, interstitial cystitis, Sojourn syndrome, sickle cell trait presents  emergency  department today for abdominal pain, nausea and diarrhea for the past 10 days.  Patient with generalized abdominal tenderness, no guarding on exam.  Patient appears very well.  Will obtain basic labs at this time.  Afebrile, nontachycardic. HA is mild, she thinks this is most likely from dehydration. No fever, normal neuro exam, no meningsmus.   CBC with leukocytosis of 15.8, otherwise unremarkable.  CMP with potassium of 3, will replete that here today.  CT imaging without any abnormality.   Upon reevaluation, patient states that her pain is a little better, still having pain.  No surgical abdomen, no peritoneal signs.  I think that there could be concern for Covid as well.  Will obtain 6-24 Covid swab.  Pending urinalysis.  Patient has been here for over 5 hours, has not had any diarrhea.    Pt care was handed off to K. Sofia PA-C at  330.  Complete history and physical and current plan have been communicated.  Please refer to their note for the remainder of ED care and ultimate disposition.  Awaiting urinalysis and PO challenge.   BRYANA BOXER was evaluated in Emergency Department on 06/12/2020 for the symptoms described in the history of present illness. She was evaluated in the context of the global COVID-19 pandemic, which necessitated consideration that the patient might be at risk for infection with the SARS-CoV-2 virus that causes COVID-19. Institutional protocols and algorithms that pertain to the evaluation of patients at risk for COVID-19 are in a state of rapid change based on information released by regulatory bodies including the CDC and federal and state organizations. These policies and algorithms were followed during the patient's care in the ED.   Final Clinical Impression(s) / ED Diagnoses Final diagnoses:  Generalized abdominal pain  Diarrhea, unspecified type    Rx / DC Orders ED Discharge Orders    None       Alfredia Client, PA-C 06/12/20 [place], [place], DO 06/12/20 1540

## 2020-06-12 NOTE — ED Notes (Signed)
POC Beta given to MD Eulis Foster.

## 2020-06-12 NOTE — ED Notes (Signed)
Patient was offered fluid and tolerated it well.

## 2020-06-12 NOTE — ED Provider Notes (Signed)
Pt tolerating oral fluids, Ua no acute abnormality.  Pt advised to follow up with her doctor for recheck    Sidney Ace 06/12/20 1656    Drenda Freeze, MD 06/12/20 [phone]

## 2020-06-12 NOTE — Discharge Instructions (Signed)
You are seen today for abdominal pain and diarrhea.  Your work-up today was reassuring, your potassium was low.  I did prescribe you some to take over the next week.  I want you to follow-up with your GI doctor at [facility], I think you might need a scope.  Continue to take your Bentyl and your other medications as prescribed.  Make sure to stay hydrated.  I also attached some food choices to help relieve diarrhea.  If you have any new or worsening concerning symptoms please come back to the emergency department.  You will receive your Covid test in the next 6 to 24 hours, if this is positive please isolate and follow CDC guidelines.

## 2020-06-12 NOTE — ED Triage Notes (Signed)
Pt has abd pain with nausea and diarrhea for over 10 days, Headache has now started. History of Colitis

## 2020-06-13 LAB — SARS CORONAVIRUS 2 (TAT 6-24 HRS): SARS Coronavirus 2: NEGATIVE

## 2020-06-23 ENCOUNTER — Emergency Department (HOSPITAL_COMMUNITY): Payer: Medicaid Other

## 2020-06-23 ENCOUNTER — Other Ambulatory Visit: Payer: Self-pay

## 2020-06-23 ENCOUNTER — Encounter (HOSPITAL_COMMUNITY): Payer: Self-pay | Admitting: Obstetrics and Gynecology

## 2020-06-23 ENCOUNTER — Emergency Department (HOSPITAL_COMMUNITY)
Admission: EM | Admit: 2020-06-23 | Discharge: 2020-06-23 | Disposition: A | Discharge status: Home / Self Care | Payer: Medicaid Other | Attending: Emergency Medicine | Admitting: Emergency Medicine

## 2020-06-23 DIAGNOSIS — R202 Paresthesia of skin: Secondary | ICD-10-CM | POA: Diagnosis not present

## 2020-06-23 DIAGNOSIS — M5442 Lumbago with sciatica, left side: Secondary | ICD-10-CM | POA: Insufficient documentation

## 2020-06-23 DIAGNOSIS — J45909 Unspecified asthma, uncomplicated: Secondary | ICD-10-CM | POA: Diagnosis not present

## 2020-06-23 DIAGNOSIS — M5441 Lumbago with sciatica, right side: Secondary | ICD-10-CM | POA: Diagnosis not present

## 2020-06-23 DIAGNOSIS — R519 Headache, unspecified: Secondary | ICD-10-CM | POA: Insufficient documentation

## 2020-06-23 DIAGNOSIS — E1140 Type 2 diabetes mellitus with diabetic neuropathy, unspecified: Secondary | ICD-10-CM | POA: Diagnosis not present

## 2020-06-23 DIAGNOSIS — X500XXA Overexertion from strenuous movement or load, initial encounter: Secondary | ICD-10-CM | POA: Insufficient documentation

## 2020-06-23 DIAGNOSIS — Z79899 Other long term (current) drug therapy: Secondary | ICD-10-CM | POA: Diagnosis not present

## 2020-06-23 DIAGNOSIS — I110 Hypertensive heart disease with heart failure: Secondary | ICD-10-CM | POA: Diagnosis not present

## 2020-06-23 DIAGNOSIS — I509 Heart failure, unspecified: Secondary | ICD-10-CM | POA: Insufficient documentation

## 2020-06-23 DIAGNOSIS — G8929 Other chronic pain: Secondary | ICD-10-CM

## 2020-06-23 DIAGNOSIS — E039 Hypothyroidism, unspecified: Secondary | ICD-10-CM | POA: Insufficient documentation

## 2020-06-23 DIAGNOSIS — Z91040 Latex allergy status: Secondary | ICD-10-CM | POA: Insufficient documentation

## 2020-06-23 DIAGNOSIS — M542 Cervicalgia: Secondary | ICD-10-CM | POA: Diagnosis not present

## 2020-06-23 DIAGNOSIS — Z85850 Personal history of malignant neoplasm of thyroid: Secondary | ICD-10-CM | POA: Diagnosis not present

## 2020-06-23 DIAGNOSIS — M545 Low back pain, unspecified: Secondary | ICD-10-CM | POA: Diagnosis present

## 2020-06-23 LAB — GLUCOSE, CAPILLARY: Glucose-Capillary: 104 mg/dL — ABNORMAL HIGH (ref 70–99)

## 2020-06-23 LAB — BASIC METABOLIC PANEL
Anion gap: 11 (ref 5–15)
BUN: 9 mg/dL (ref 6–20)
CO2: 29 mmol/L (ref 22–32)
Calcium: 9.3 mg/dL (ref 8.9–10.3)
Chloride: 101 mmol/L (ref 98–111)
Creatinine, Ser: 0.81 mg/dL (ref 0.44–1.00)
GFR, Estimated: >60 mL/min (ref >60)
Glucose, Bld: 115 mg/dL — ABNORMAL HIGH (ref 70–99)
Potassium: 3.6 mmol/L (ref 3.5–5.1)
Sodium: 141 mmol/L (ref 135–145)

## 2020-06-23 LAB — CBC
HCT: 36.6 % (ref 36.0–46.0)
Hemoglobin: 12.3 g/dL (ref 12.0–15.0)
MCH: 28.6 pg (ref 26.0–34.0)
MCHC: 33.6 g/dL (ref 30.0–36.0)
MCV: 85.1 fL (ref 80.0–100.0)
Platelets: 381 10*3/uL (ref 150–400)
RBC: 4.3 MIL/uL (ref 3.87–5.11)
RDW: 14.5 % (ref 11.5–15.5)
WBC: 9.8 10*3/uL (ref 4.0–10.5)
nRBC: 0 % (ref 0.0–0.2)

## 2020-06-23 LAB — URINALYSIS, ROUTINE W REFLEX MICROSCOPIC
Bilirubin Urine: NEGATIVE
Glucose, UA: NEGATIVE mg/dL
Ketones, ur: NEGATIVE mg/dL
Leukocytes,Ua: NEGATIVE
Nitrite: NEGATIVE
Protein, ur: NEGATIVE mg/dL
Specific Gravity, Urine: 1.011 (ref 1.005–1.030)
pH: 6 (ref 5.0–8.0)

## 2020-06-23 LAB — I-STAT BETA HCG BLOOD, ED (NOT ORDERABLE): I-stat hCG, quantitative: <5 m[IU]/mL (ref <5)

## 2020-06-23 MED ORDER — METHOCARBAMOL 500 MG PO TABS: 500.0000 mg | ORAL_TABLET | Freq: Four times a day (QID) | ORAL | 0 refills | Status: DC

## 2020-06-23 MED ORDER — ONDANSETRON 4 MG PO TBDP
4.0000 mg | ORAL_TABLET | Freq: Once | ORAL | Status: AC
  Administered 2020-06-23: 4 mg via ORAL
  Filled 2020-06-23: qty 1

## 2020-06-23 MED ORDER — HYDROMORPHONE HCL 2 MG/ML IJ SOLN
2.0000 mg | Freq: Once | INTRAMUSCULAR | Status: AC
  Administered 2020-06-23: 2 mg via INTRAMUSCULAR
  Filled 2020-06-23: qty 1

## 2020-06-23 MED ORDER — METHOCARBAMOL 500 MG PO TABS
1000.0000 mg | ORAL_TABLET | Freq: Once | ORAL | Status: AC
  Administered 2020-06-23: 1000 mg via ORAL
  Filled 2020-06-23: qty 2

## 2020-06-23 MED ORDER — PREDNISONE 20 MG PO TABS: ORAL_TABLET | ORAL | 0 refills | Status: DC

## 2020-06-23 MED ORDER — DEXAMETHASONE SODIUM PHOSPHATE 10 MG/ML IJ SOLN
10.0000 mg | Freq: Once | INTRAMUSCULAR | Status: AC
  Administered 2020-06-23: 10 mg via INTRAMUSCULAR
  Filled 2020-06-23: qty 1

## 2020-06-23 NOTE — Discharge Instructions (Signed)
Please read and follow all provided instructions.  Your diagnoses today include:  1. Chronic bilateral low back pain with bilateral sciatica   2. Chronic neck pain   3. Paresthesias     Tests performed today include:  CT of your head which was normal and did not show any serious cause of your headache  Blood counts electrolytes -were all normal  EKG -stable when compared to previous  Vital signs. See below for your results today.   Medications:  Prednisone - steroid medicine   It is best to take this medication in the morning to prevent sleeping problems. If you are diabetic, monitor your blood sugar closely and stop taking Prednisone if blood sugar is over 300. Take with food to prevent stomach upset.    Robaxin (methocarbamol) - muscle relaxer medication  DO NOT drive or perform any activities that require you to be awake and alert because this medicine can make you drowsy.   Take any prescribed medications only as directed.  Additional information:  Follow any educational materials contained in this packet.  You are having a headache. No specific cause was found today for your headache. It may have been a migraine or other cause of headache. Stress, anxiety, fatigue, and depression are common triggers for headaches.   Follow-up instructions: Please follow-up with your primary care provider in the next 3 days for further evaluation of your symptoms.   Return instructions:   Please return to the Emergency Department if you experience worsening symptoms.  Return if the medications do not resolve your headache, if it recurs, or if you have multiple episodes of vomiting or cannot keep down fluids.  Return if you have a change from the usual headache.  RETURN IMMEDIATELY IF you:  Develop a sudden, severe headache  Develop confusion or become poorly responsive or faint  Develop a fever above 100.32F or problem breathing  Have a change in speech, vision, swallowing,  or understanding  Develop new weakness, numbness, tingling, incoordination in your arms or legs  Have a seizure  Please return if you have any other emergent concerns.  Additional Information:  Your vital signs today were: BP (!) 172/109    Pulse (!) 106    Temp 98 F (36.7 C) (Oral)    Resp 12    LMP 02/04/2018    SpO2 97%  If your blood pressure (BP) was elevated above 135/85 this visit, please have this repeated by your doctor within one month. --------------

## 2020-06-23 NOTE — ED Triage Notes (Signed)
Patient reports to the ER for back pain and neck pain. Patient reports left sided tingling and numbness presenting more on the right side. Patient reports she last was normal Friday night.

## 2020-06-23 NOTE — ED Provider Notes (Signed)
[facility] DEPT Provider Note   CSN: [id number] Arrival date & time: 06/23/20  1213     History Chief Complaint  Patient presents with  . Numbness  . Back Pain    Alexis [name] is a 55 y.o. female.  Patient with history of chronic lower back pain, chronic left upper extremity numbness, tingling and weakness presents the emergency department for evaluation of worsening lower back pain, worsening tingling in her left upper extremity and headache.  Patient states that she was lifting some heavy objects 2 days ago.  After this time her pain worsened.  She denies acute injury and does not know if the lifting made her symptoms worse.  She reports bilateral lower back pain across her lower back with radiation down into the anterior portions of both legs.  She states that she is awaiting scheduling for surgery for spinal fusion in her lower back.  She also states that the numbness and tingling in her left upper extremity is worse than baseline.  Patient has been evaluated for these problems in the past by neurology and neurosurgery.  She states that this started in 2020.  She had MRI brain and spine in 2020 which was essentially normal except for disc bulges in her lumbar spine.  Last filled #165 tablets of 30mg  oxycodone on 05/20/2020.  She is also taking tizanidine daily. Patient denies warning symptoms of back pain including: fecal incontinence, urinary retention or overflow incontinence (states chronic urinary urgency for which she is undergoing evaluation), night sweats, waking from sleep with back pain, unexplained fevers or weight loss, h/o cancer, IVDU, recent trauma.           Past Medical History:  Diagnosis Date  . 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 Anemia   . ASD (atrial septal defect)    per pt  . Asthma   . Bilateral dry eyes   . Cervical spondylosis    C5-6  . CHF (congestive heart failure) ([place])    not sure  . Chronic  back pain   . Chronic bronchitis ([place])   . Chronic colitis   . Chronic lower GI bleeding   . Complication of anesthesia    has awakened in past during surgery/  [place]  03-08-2009 ([facility])  . Degenerative disk disease    "cervical to sacrum" (05/17/2017)  . Depression   . Diabetes mellitus without complication ([place])   . Diabetic neuropathy, type II diabetes mellitus ([place]) 12/14/2014   not on medication  . DJD (degenerative joint disease) of knee   . Dyspnea   . Endometriosis 01/18/01  . Family history of adverse reaction to anesthesia    "mom wakes up during procedures"  . Fatty liver   . Fibromyalgia   . Gastric ulcer   . GERD (gastroesophageal reflux disease)   . Glaucoma of both eyes   . H/O hiatal hernia   . High cholesterol   . History of colon polyps   . Hypertension   . Hypothyroidism, postsurgical   . IBS (irritable bowel syndrome)   . IC (interstitial cystitis)   . Interstitial cystitis   . Lymphedema   . Migraine    "monthly" (05/17/2017)  02/06/2019- more frequently  . OSA on CPAP   . Osteoarthritis    "all over; mainly knees and back" (05/17/2017)  . PONV (postoperative nausea and vomiting)   . PTSD (post-traumatic stress disorder)   .  PTSD (post-traumatic stress disorder)   . Sickle cell trait ([place])   . Sjogren's syndrome ([place])   . Thyroid cancer ([place])   . Tremor 12/14/2014   hand  . Uterine fibroid   . Varicose veins   . Vestibular dizziness   . Vocal cord paralysis, unilateral complete    "left";  POST TOTAL THYROIDECTOMY    Patient Active Problem List   Diagnosis Date Noted  . Colitis with rectal bleeding 05/16/2017  . Chronic pain   . Nausea and vomiting   . Fibromyalgia 02/21/2016  . Tremor 12/14/2014  . Diabetic neuropathy, type II diabetes mellitus ([place]) 12/14/2014  . Varicose veins of lower extremities with other complications [id number]  . Leg swelling 07/25/2012  . SOB (shortness of  breath) 02/22/2012  . Chronic pelvic pain in female 06/26/2000    Past Surgical History:  Procedure Laterality Date  . ACHILLES TENDON REPAIR Left 2007  . BUNIONECTOMY WITH HAMMERTOE RECONSTRUCTION AND GASTROC SLIDE Left FEB  2011   REMOVAL HARDWARE  NOV  2011  . CARDIAC CATHETERIZATION N/A 10/28/2015   Procedure: Right Heart Cath;  Surgeon: Adrian Prows, MD;  Location: [facility];  Service: Cardiovascular;  Laterality: N/A;  . COLONOSCOPY WITH ESOPHAGOGASTRODUODENOSCOPY (EGD)  MULTIPLE --  LAST ONE   NOV  2014  . CYSTO/  BILATERAL RETROGRADE PYELOGRAM/  HYDRODISTENTION/  INSTILLATION THERAPY  05-08-2003  . CYSTO/  HYDRODISTENTION/  INSTILLATION THERAPY  06-04-2005  &  03-04-2007  . CYSTO/  URETHRAL DILATION /  BILATERAL UNROOFING URETEROCELE/  BILATERAL URETERAL STENT PLACEMENT  12-01-2002  . D & C HYSTEROSCOPY/  REMOVAL IUD  12-06-2003  . DE QUERVAIN'S RELEASE Left ?09/2005  . DIAGNOSTIC LAPAROSCOPY    . DILATATION AND CURETTAGE WITH SUCTION  05-29-2005   RETAINED PLACENTA  . DILATION AND CURETTAGE OF UTERUS    . DX LAPAROSCOPY/  PELVIC BX'S/  LYSIS ADHESIONS  06-26-2000  . FOOT HARDWARE REMOVAL Left 03/2010   REMOVAL HARDWARE "related to earlier hammertoe OR"  . HYDRADENITIS EXCISION  06/29/2011   Procedure: EXCISION HYDRADENITIS AXILLA;  Surgeon: Hermelinda Dellen, MD;  Location: [facility];  Service: Plastics;;  right breast hydradenitis excioion  . HYDRADENITIS EXCISION Left 2000  . INCISION AND DRAINAGE ABSCESS ANAL  02-14-2001  . KNEE ARTHROSCOPY WITH LATERAL MENISECTOMY Right 09/08/2013   Procedure: RIGHT KNEE ARTHROSCOPY PARTIAL LATERAL MENISCECTOMY AND DEBRIDEMENT ;  Surgeon: Johnn Hai, MD;  Location: [facility];  Service: Orthopedics;  Laterality: Right;  . LAPAROSCOPY ABDOMEN DIAGNOSTIC  SEPT  2003   [place]   AND PLACEMENT IUD  . MASS EXCISION  10/29/2003   WIDE EXCISION CHEST AREA  . PILONIDAL CYST EXCISION  1983  .  RADIOLOGY WITH ANESTHESIA N/A 02/07/2019   Procedure: MRI BRAIN WITHOUT IV CONTRAST; MRI CERVICAL, THORACIC, AND LUMBAR SPINE WITH AND WITHOUT CONTRAST;  Surgeon: Radiologist, Medication, MD;  Location: [facility];  Service: Radiology;  Laterality: N/A;  . RADIOLOGY WITH ANESTHESIA N/A 02/15/2020   Procedure: MRI LUMBAR WITHOUT CONTRAST;  Surgeon: Radiologist, Medication, MD;  Location: [facility];  Service: Radiology;  Laterality: N/A;  . REDUCTION MAMMAPLASTY  1997  . TEE WITHOUT CARDIOVERSION N/A 09/26/2015   Procedure: TRANSESOPHAGEAL ECHOCARDIOGRAM (TEE);  Surgeon: Adrian Prows, MD;  Location: [facility];  Service: Cardiovascular;  Laterality: N/A;  . TEMPORAL ARTERY BIOPSY / LIGATION Left 2010  . TEMPORAL ARTERY BIOPSY / LIGATION Right 06-16-2002  . TONSILLECTOMY  1990  . TOTAL THYROIDECTOMY  11/06/2008     OB History    Gravida  1   Para  1   Term      Preterm      AB      Living  1     SAB      IAB      Ectopic      Multiple      Live Births  1           Family History  Problem Relation Age of Onset  . Atrial fibrillation Mother   . Diabetes Mother   . Thyroid disease Mother   . Cushing syndrome Mother   . Tremor Mother   . Atrial fibrillation Father   . Glaucoma Father   . Tremor Sister   . Cancer Paternal Aunt        breast, colon    Social History   Tobacco Use  . Smoking status: Never Smoker  . Smokeless tobacco: Never Used  Vaping Use  . Vaping Use: Never used  Substance Use Topics  . Alcohol use: Not Currently    Comment: rare  . Drug use: No    Home Medications Prior to Admission medications   Medication Sig Start Date End Date Taking? Authorizing Provider  acetaminophen (TYLENOL) 500 MG tablet Take 1,000 mg by mouth every 6 (six) hours as needed for headache.    [provider]  albuterol (PROVENTIL HFA;VENTOLIN HFA) 108 (90 BASE) MCG/ACT inhaler Inhale 2 puffs into the lungs every 4 (four) hours as needed for wheezing or shortness  of breath.     [provider]  atorvastatin (LIPITOR) 20 MG tablet Take 20 mg by mouth daily.    [provider]  cetirizine (ZYRTEC) 10 MG chewable tablet Chew 10 mg by mouth daily.    [provider]  diclofenac sodium (VOLTAREN) 1 % GEL Apply 4 g topically 4 (four) times daily as needed (pain.).  11/11/18   [provider]  dicyclomine (BENTYL) 20 MG tablet Take 1 tablet (20 mg total) by mouth 2 (two) times daily. 03/20/20   Marcello Fennel, PA-C  DULERA 200-5 MCG/ACT AERO Inhale 2 puffs into the lungs 2 (two) times daily. 05/03/17   [provider]  DULoxetine (CYMBALTA) 30 MG capsule Take 60 mg by mouth at bedtime. 01/24/20   [provider]  HYDROcodone-acetaminophen (NORCO/VICODIN) 5-325 MG tablet Take 1 tablet by mouth every 6 (six) hours as needed for moderate pain. Patient not taking: No sig reported 02/10/20   Milton Ferguson, MD  hydrOXYzine (ATARAX/VISTARIL) 50 MG tablet Take 50 mg by mouth 4 (four) times daily. 09/15/16   [provider]  Lidocaine 0.5 % GEL Apply 1 application topically 3 (three) times daily as needed (pain).     [provider]  LYRICA 75 MG capsule Take 75 mg by mouth 2 (two) times daily.  07/24/16   [provider]  Melatonin 10 MG CAPS Take 10 mg by mouth at bedtime.     [provider]  metoprolol succinate (TOPROL-XL) 25 MG 24 hr tablet Take 1 tablet (25 mg total) by mouth daily. Patient not taking: Reported on 06/12/2020 02/10/20   Milton Ferguson, MD  naproxen sodium (ALEVE) 220 MG tablet Take 440 mg by mouth daily as needed (pain).    [provider]  omeprazole (PRILOSEC) 40 MG capsule Take 40 mg by mouth daily.    [provider]  omeprazole (PRILOSEC) 40 MG capsule Take  1 capsule (40 mg total) by mouth daily for 21 days. Patient not taking: Reported on 06/12/2020 03/20/20 04/10/20  Marcello Fennel, PA-C  ondansetron (ZOFRAN-ODT) 8 MG disintegrating  tablet Take 8 mg by mouth every 8 (eight) hours as needed for nausea or vomiting.    [provider]  OVER THE COUNTER MEDICATION Apply 1 application topically 3 (three) times daily. CBD cream    [provider]  oxycodone (ROXICODONE) 30 MG immediate release tablet Take 30 mg by mouth every 4 (four) hours as needed for pain.    [provider]  predniSONE (DELTASONE) 20 MG tablet Take 20 mg by mouth daily.  10/11/16   [provider]  promethazine (PHENERGAN) 12.5 MG suppository Place 1 suppository (12.5 mg total) rectally every 6 (six) hours as needed for up to 12 days for nausea or vomiting. 03/20/20 04/01/20  Marcello Fennel, PA-C  promethazine (PHENERGAN) 25 MG tablet Take 25 mg by mouth every 6 (six) hours as needed for nausea or vomiting.    [provider]  thyroid (ARMOUR) 120 MG tablet Take 120 mg by mouth daily before breakfast.    [provider]  tiZANidine (ZANAFLEX) 4 MG tablet Take 4 mg by mouth 3 (three) times daily as needed for muscle spasms.  01/24/20   [provider]  triamterene-hydrochlorothiazide (MAXZIDE-25) 37.5-25 MG tablet Take 1 tablet by mouth daily. 03/01/20   [provider]  Vitamin D, Ergocalciferol, (DRISDOL) 1.25 MG (50000 UNIT) CAPS capsule Take 50,000 Units by mouth once a week. Tuesday 01/24/20   [provider]  lisinopril (ZESTRIL) 20 MG tablet Take 1 tablet (20 mg total) by mouth daily. 02/10/20 02/10/20  Milton Ferguson, MD    Allergies    Food, Other, Adhesive [tape], Bactrim, Clarithromycin, Ibuprofen, Nexium [esomeprazole], Nsaids, Peg 3350-electrolytes, Sulfa antibiotics, Tisagenlecleucel, Tolmetin, and Latex  Review of Systems   Review of Systems  Constitutional: Negative for fever and unexpected weight change (recent weight loss with bout of colitis (10-15lb)).  HENT: Negative for rhinorrhea and sore throat.   Eyes: Negative for redness.  Respiratory: Negative for cough  and shortness of breath.   Cardiovascular: Negative for chest pain.  Gastrointestinal: Negative for abdominal pain, constipation, diarrhea, nausea and vomiting.       Negative for fecal incontinence.   Genitourinary: Positive for urgency (chronic). Negative for dysuria, flank pain, frequency, hematuria and pelvic pain.       Negative for urinary incontinence or retention.  Musculoskeletal: Positive for back pain and neck pain. Negative for myalgias.  Skin: Negative for rash.  Neurological: Positive for numbness (paresthesias) and headaches. Negative for weakness.       Denies saddle paresthesias.    Physical Exam Updated Vital Signs BP (!) 164/98 (BP Location: Right Arm)   Pulse (!) 108   Temp 98 F (36.7 C) (Oral)   Resp 16   LMP 02/04/2018   SpO2 95%   Physical Exam Vitals and nursing note reviewed.  Constitutional:      Appearance: She is well-developed and well-nourished.  HENT:     Head: Normocephalic and atraumatic.  Eyes:     Conjunctiva/sclera: Conjunctivae normal.  Pulmonary:     Effort: Pulmonary effort is normal.  Abdominal:     Palpations: Abdomen is soft.     Tenderness: There is no abdominal tenderness. There is no CVA tenderness.  Musculoskeletal:        General: Normal range of motion.     Cervical back:  Normal range of motion and neck supple. Tenderness (mild left paraspinous) present. No spasms. Normal range of motion.     Thoracic back: No tenderness or bony tenderness. Normal range of motion.     Lumbar back: Tenderness present. No spasms or bony tenderness. Normal range of motion.     Comments: No step-off noted with palpation of spine.   Skin:    General: Skin is warm and dry.     Findings: No rash.  Neurological:     Mental Status: She is alert.     Sensory: No sensory deficit.     Deep Tendon Reflexes: Strength normal and reflexes are normal and symmetric.     Comments: 5/5 strength in entire lower extremities bilaterally. No sensation deficit.    Psychiatric:        Mood and Affect: Mood and affect normal.     ED Results / Procedures / Treatments   Labs (all labs ordered are listed, but only abnormal results are displayed) Labs Reviewed  BASIC METABOLIC PANEL - Abnormal; Notable for the following components:      Result Value   Glucose, Bld 115 (*)    All other components within normal limits  URINALYSIS, ROUTINE W REFLEX MICROSCOPIC - Abnormal; Notable for the following components:   Hgb urine dipstick SMALL (*)    Bacteria, UA RARE (*)    All other components within normal limits  GLUCOSE, CAPILLARY - Abnormal; Notable for the following components:   Glucose-Capillary 104 (*)    All other components within normal limits  CBC  CBG MONITORING, ED  I-STAT BETA HCG BLOOD, ED ([facility], [facility], [facility] ONLY)  I-STAT BETA HCG BLOOD, ED (NOT ORDERABLE)    EKG EKG Interpretation  Date/Time:  Sunday June 23 2020 13:54:47 EST Ventricular Rate:  106 PR Interval:    QRS Duration: 85 QT Interval:  341 QTC Calculation: 453 R Axis:   11 Text Interpretation: Sinus tachycardia Abnormal R-wave progression, early transition LVH by voltage 12 Lead; [place] No significant change since last tracing Confirmed by Calvert Cantor [phone]) on 06/23/2020 1:59:53 PM   Radiology CT HEAD WO CONTRAST  Result Date: 06/23/2020 CLINICAL DATA:  Headache. EXAM: CT HEAD WITHOUT CONTRAST TECHNIQUE: Contiguous axial images were obtained from the base of the skull through the vertex without intravenous contrast. COMPARISON:  February 10, 2020. FINDINGS: Brain: No evidence of acute infarction, hemorrhage, hydrocephalus, extra-axial collection or mass lesion/mass effect. Vascular: No hyperdense vessel or unexpected calcification. Skull: Normal. Negative for fracture or focal lesion. Sinuses/Orbits: No acute finding. Other: None. IMPRESSION: Normal head CT. Electronically Signed   By: Marijo Conception M.D.   On: 06/23/2020 14:02    Procedures Procedures    Medications Ordered in ED Medications  HYDROmorphone (DILAUDID) injection 2 mg (2 mg Intramuscular Given 06/23/20 1535)  ondansetron (ZOFRAN-ODT) disintegrating tablet 4 mg (4 mg Oral Given 06/23/20 1534)  dexamethasone (DECADRON) injection 10 mg (10 mg Intramuscular Given 06/23/20 1534)  methocarbamol (ROBAXIN) tablet 1,000 mg (1,000 mg Oral Given 06/23/20 1534)    ED Course  I have reviewed the triage vital signs and the nursing notes.  Pertinent labs & imaging results that were available during my care of the patient were reviewed by me and considered in my medical decision making (see chart for details).  Patient seen and examined.  Spent some time reviewing sensations, work-up ork-up, imaging over the past several years.  None of the patient's current symptoms are new.  She just reports that  they are more severe than typical.  She has a reassuring exam overall.  No significant weakness in her extremities.  She has normal sensation except for decreased sensation in her left forearm and wrist.  Her grip strength does not feel weak.  She is mentating normally.  I feel that her symptoms are most likely acute on chronic pain related.  I do not feel that the patient requires emergent MRI today.  She is on very high doses of pain medication.  Offered attempt at symptom control and then discharged home with follow-up with her specialist.  We will give IM Dilaudid, IM Decadron, Zofran and Robaxin orally.  Plan to discharge to home on Robaxin and steroid taper.  Patient does report taking tizanidine and will replace this with Robaxin temporarily to see if it helps more, she does not feel that the tizanidine is doing much currently.  We reviewed her lab work, CT head, and EKG at bedside  Vital signs reviewed and are as follows: BP (!) 172/109   Pulse (!) 106   Temp 98 F (36.7 C) (Oral)   Resp 12   LMP 02/04/2018   SpO2 97%   Encouraged return to emergency department for worsening symptoms. Patient  was counseled on back pain precautions and told to do activity as tolerated but do not lift, push, or pull heavy objects more than 10 pounds for the next week.  Patient counseled to use ice or heat on back for no longer than 15 minutes every hour.   Patient counseled on proper use of muscle relaxant medication.  They were told not to drink alcohol, drive any vehicle, or do any dangerous activities while taking this medication.  Patient verbalized understanding.  Patient urged to follow-up with PCP if pain does not improve with treatment and rest or if pain becomes recurrent. Urged to return with worsening severe pain, loss of bowel or bladder control, trouble walking.   The patient verbalizes understanding and agrees with the plan.    MDM Rules/Calculators/A&P                          Patient with chronic pains in her neck and back and also paresthesias in the left arm which has been extensively worked up in the past with MRI imaging.  She is established with both neurology and neurosurgery.  Patient presents today with what seems to be acute on chronic symptoms.  She does not have any worsening weakness and is able to ambulate.  No other significant red flags.  I do not feel that she requires emergent imaging today.  Attempt as above to control symptoms in the ED.  She will continue home medications and will prescribe steroid taper.  She strongly encouraged to follow-up with her physician and specialists.  Return instructions as above.  Evaluation ordered at triage including head CT, labs, EKG were all unremarkable and at patient's baseline.    Final Clinical Impression(s) / ED Diagnoses Final diagnoses:  Chronic bilateral low back pain with bilateral sciatica  Chronic neck pain  Paresthesias    Rx / DC Orders ED Discharge Orders         Ordered    predniSONE (DELTASONE) 20 MG tablet        06/23/20 1544    methocarbamol (ROBAXIN) 500 MG tablet  4 times daily        06/23/20 1544  Carlisle Cater, PA-C 06/23/20 1545    Quintella Reichert, MD 06/23/20 1725

## 2020-10-08 ENCOUNTER — Ambulatory Visit: Payer: Medicaid Other | Admitting: Neurology

## 2020-10-11 ENCOUNTER — Encounter: Payer: Self-pay | Admitting: Neurology

## 2020-10-11 ENCOUNTER — Ambulatory Visit: Payer: Medicaid Other | Admitting: Neurology

## 2020-10-11 VITALS — Ht 64.0 in | Wt 234.0 lb

## 2020-10-11 DIAGNOSIS — R42 Dizziness and giddiness: Secondary | ICD-10-CM | POA: Insufficient documentation

## 2020-10-11 DIAGNOSIS — M797 Fibromyalgia: Secondary | ICD-10-CM

## 2020-10-11 MED ORDER — TOPIRAMATE 25 MG PO TABS: ORAL_TABLET | ORAL | 3 refills | Status: DC

## 2020-10-11 NOTE — Progress Notes (Signed)
Reason for visit: Tremor, memory disturbance, headache, dizziness  Referring physician: Dr. Modena Morrow is a 55 y.o. female  History of present illness:  Alexis [name] is a 55 year old right-handed black female with a history of multiple somatic complaints.  The patient has a history of interstitial cystitis, fibromyalgia, chronic low back pain on opiate medications, diabetes, obesity, thyroid cancer, sleep apnea, not yet on CPAP.  The patient has been seen through this office 2 years ago with problems with tremors since 2011, she has had chronic dizziness associated with a lightheaded sensation, but not true vertigo.  She claims at the dizziness is present with sitting, standing, or lying down.  She reports that the tremor is most notable when she is doing something with her hands, she also indicates that she drops things frequently.  The patient has had minimal change in her handwriting.  She occasionally may have quivering on her lips.  She has chronic problems with left-sided numbness of the face, arms, legs that is felt to be psychogenic in nature.  MRI evaluations of the brain previously were normal.  The patient does report some occasional falls, she last fell about 2 weeks ago.  She reports leg cramps on the left leg, she was recently found to have a hamstring tear and hip joint problems through orthopedic surgery.  The patient has had problems with increased heart rate and constipation on a combination of a Transderm Scop patch, oxybutynin, and Bentyl.  She recently was taken off of her metoprolol.  The patient claims to have headaches 2-3 times a week, usually in the back of the head.  She has a lot of nausea and takes Zofran and Phenergan frequently.  She reports that she will occasionally have some dizziness with the headache, she does not relate her tremors or her dizziness to any medications that were started in the past.  She is on Lyrica 75 mg twice daily.  She is sent to  this office for further evaluation.  Past Medical History:  Diagnosis Date  . 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 Anemia   . ASD (atrial septal defect)    per pt  . Asthma   . Bilateral dry eyes   . Cervical spondylosis    C5-6  . CHF (congestive heart failure) ([place])    not sure  . Chronic back pain   . Chronic bronchitis ([place])   . Chronic colitis   . Chronic lower GI bleeding   . Complication of anesthesia    has awakened in past during surgery/  [place]  03-08-2009 ([facility])  . Degenerative disk disease    "cervical to sacrum" (05/17/2017)  . Depression   . Diabetes mellitus without complication ([place])   . Diabetic neuropathy, type II diabetes mellitus ([place]) 12/14/2014   not on medication  . DJD (degenerative joint disease) of knee   . Dyspnea   . Endometriosis 01/18/01  . Family history of adverse reaction to anesthesia    "mom wakes up during procedures"  . Fatty liver   . Fibromyalgia   . Gastric ulcer   . GERD (gastroesophageal reflux disease)   . Glaucoma of both eyes   . H/O hiatal hernia   . High cholesterol   . History of colon polyps   . Hypertension   . Hypothyroidism, postsurgical   . IBS (irritable bowel syndrome)   . IC (interstitial  cystitis)   . Interstitial cystitis   . Lymphedema   . Migraine    "monthly" (05/17/2017)  02/06/2019- more frequently  . OSA on CPAP   . Osteoarthritis    "all over; mainly knees and back" (05/17/2017)  . PONV (postoperative nausea and vomiting)   . PTSD (post-traumatic stress disorder)   . PTSD (post-traumatic stress disorder)   . Sickle cell trait ([place])   . Sjogren's syndrome ([place])   . Thyroid cancer ([place])   . Tremor 12/14/2014   hand  . Uterine fibroid   . Varicose veins   . Vestibular dizziness   . Vocal cord paralysis, unilateral complete    "left";  POST TOTAL THYROIDECTOMY    Past Surgical History:  Procedure Laterality Date  . ACHILLES  TENDON REPAIR Left 2007  . BUNIONECTOMY WITH HAMMERTOE RECONSTRUCTION AND GASTROC SLIDE Left FEB  2011   REMOVAL HARDWARE  NOV  2011  . CARDIAC CATHETERIZATION N/A 10/28/2015   Procedure: Right Heart Cath;  Surgeon: Adrian Prows, MD;  Location: [facility];  Service: Cardiovascular;  Laterality: N/A;  . COLONOSCOPY WITH ESOPHAGOGASTRODUODENOSCOPY (EGD)  MULTIPLE --  LAST ONE   NOV  2014  . CYSTO/  BILATERAL RETROGRADE PYELOGRAM/  HYDRODISTENTION/  INSTILLATION THERAPY  05-08-2003  . CYSTO/  HYDRODISTENTION/  INSTILLATION THERAPY  06-04-2005  &  03-04-2007  . CYSTO/  URETHRAL DILATION /  BILATERAL UNROOFING URETEROCELE/  BILATERAL URETERAL STENT PLACEMENT  12-01-2002  . D & C HYSTEROSCOPY/  REMOVAL IUD  12-06-2003  . DE QUERVAIN'S RELEASE Left ?09/2005  . DIAGNOSTIC LAPAROSCOPY    . DILATATION AND CURETTAGE WITH SUCTION  05-29-2005   RETAINED PLACENTA  . DILATION AND CURETTAGE OF UTERUS    . DX LAPAROSCOPY/  PELVIC BX'S/  LYSIS ADHESIONS  06-26-2000  . FOOT HARDWARE REMOVAL Left 03/2010   REMOVAL HARDWARE "related to earlier hammertoe OR"  . HYDRADENITIS EXCISION  06/29/2011   Procedure: EXCISION HYDRADENITIS AXILLA;  Surgeon: Hermelinda Dellen, MD;  Location: [facility];  Service: Plastics;;  right breast hydradenitis excioion  . HYDRADENITIS EXCISION Left 2000  . INCISION AND DRAINAGE ABSCESS ANAL  02-14-2001  . KNEE ARTHROSCOPY WITH LATERAL MENISECTOMY Right 09/08/2013   Procedure: RIGHT KNEE ARTHROSCOPY PARTIAL LATERAL MENISCECTOMY AND DEBRIDEMENT ;  Surgeon: Johnn Hai, MD;  Location: [facility];  Service: Orthopedics;  Laterality: Right;  . LAPAROSCOPY ABDOMEN DIAGNOSTIC  SEPT  2003   [place]   AND PLACEMENT IUD  . MASS EXCISION  10/29/2003   WIDE EXCISION CHEST AREA  . PILONIDAL CYST EXCISION  1983  . RADIOLOGY WITH ANESTHESIA N/A 02/07/2019   Procedure: MRI BRAIN WITHOUT IV CONTRAST; MRI CERVICAL, THORACIC, AND LUMBAR SPINE WITH AND WITHOUT  CONTRAST;  Surgeon: Radiologist, Medication, MD;  Location: [facility];  Service: Radiology;  Laterality: N/A;  . RADIOLOGY WITH ANESTHESIA N/A 02/15/2020   Procedure: MRI LUMBAR WITHOUT CONTRAST;  Surgeon: Radiologist, Medication, MD;  Location: [facility];  Service: Radiology;  Laterality: N/A;  . REDUCTION MAMMAPLASTY  1997  . TEE WITHOUT CARDIOVERSION N/A 09/26/2015   Procedure: TRANSESOPHAGEAL ECHOCARDIOGRAM (TEE);  Surgeon: Adrian Prows, MD;  Location: [facility];  Service: Cardiovascular;  Laterality: N/A;  . TEMPORAL ARTERY BIOPSY / LIGATION Left 2010  . TEMPORAL ARTERY BIOPSY / LIGATION Right 06-16-2002  . TONSILLECTOMY  1990  . TOTAL THYROIDECTOMY  11/06/2008    Family History  Problem Relation Age of Onset  . Atrial fibrillation Mother   .  Diabetes Mother   . Thyroid disease Mother   . Cushing syndrome Mother   . Tremor Mother   . Atrial fibrillation Father   . Glaucoma Father   . Tremor Sister   . Cancer Paternal Aunt        breast, colon    Social history:  reports that she has never smoked. She has never used smokeless tobacco. She reports previous alcohol use. She reports that she does not use drugs.  Medications:  Prior to Admission medications   Medication Sig Start Date End Date Taking? Authorizing Provider  acetaminophen (TYLENOL) 500 MG tablet Take 1,000 mg by mouth every 6 (six) hours as needed for headache.    [provider]  albuterol (PROVENTIL HFA;VENTOLIN HFA) 108 (90 BASE) MCG/ACT inhaler Inhale 2 puffs into the lungs every 4 (four) hours as needed for wheezing or shortness of breath.     [provider]  atorvastatin (LIPITOR) 20 MG tablet Take 20 mg by mouth daily.    [provider]  cetirizine (ZYRTEC) 10 MG chewable tablet Chew 10 mg by mouth daily.    [provider]  diclofenac sodium (VOLTAREN) 1 % GEL Apply 4 g topically 4 (four) times daily as needed (pain.).  11/11/18   [provider]  dicyclomine (BENTYL) 20  MG tablet Take 1 tablet (20 mg total) by mouth 2 (two) times daily. 03/20/20   Marcello Fennel, PA-C  DULERA 200-5 MCG/ACT AERO Inhale 2 puffs into the lungs 2 (two) times daily. 05/03/17   [provider]  DULoxetine (CYMBALTA) 30 MG capsule Take 60 mg by mouth at bedtime. 01/24/20   [provider]  HYDROcodone-acetaminophen (NORCO/VICODIN) 5-325 MG tablet Take 1 tablet by mouth every 6 (six) hours as needed for moderate pain. Patient not taking: No sig reported 02/10/20   Milton Ferguson, MD  hydrOXYzine (ATARAX/VISTARIL) 50 MG tablet Take 50 mg by mouth 4 (four) times daily. 09/15/16   [provider]  Lidocaine 0.5 % GEL Apply 1 application topically 3 (three) times daily as needed (pain).     [provider]  LYRICA 75 MG capsule Take 75 mg by mouth 2 (two) times daily.  07/24/16   [provider]  Melatonin 10 MG CAPS Take 10 mg by mouth at bedtime.     [provider]  methocarbamol (ROBAXIN) 500 MG tablet Take 1 tablet (500 mg total) by mouth 4 (four) times daily. 06/23/20   Carlisle Cater, PA-C  metoprolol succinate (TOPROL-XL) 25 MG 24 hr tablet Take 1 tablet (25 mg total) by mouth daily. Patient not taking: Reported on 06/12/2020 02/10/20   Milton Ferguson, MD  naproxen sodium (ALEVE) 220 MG tablet Take 440 mg by mouth daily as needed (pain).    [provider]  omeprazole (PRILOSEC) 40 MG capsule Take 40 mg by mouth daily.    [provider]  omeprazole (PRILOSEC) 40 MG capsule Take 1 capsule (40 mg total) by mouth daily for 21 days. Patient not taking: Reported on 06/12/2020 03/20/20 04/10/20  Marcello Fennel, PA-C  ondansetron (ZOFRAN-ODT) 8 MG disintegrating tablet Take 8 mg by mouth every 8 (eight) hours as needed for nausea or vomiting.    [provider]  OVER THE COUNTER MEDICATION Apply 1 application topically 3 (three) times daily. CBD cream    [provider]  oxycodone (ROXICODONE) 30 MG  immediate release tablet Take 30 mg by mouth every 4 (four) hours as needed for pain.  [provider]  predniSONE (DELTASONE) 20 MG tablet 3 Tabs PO Days 1-3, then 2 tabs PO Days 4-6, then 1 tab PO Day 7-9, then Half Tab PO Day 10-12 06/23/20   Carlisle Cater, PA-C  promethazine (PHENERGAN) 12.5 MG suppository Place 1 suppository (12.5 mg total) rectally every 6 (six) hours as needed for up to 12 days for nausea or vomiting. 03/20/20 04/01/20  Marcello Fennel, PA-C  promethazine (PHENERGAN) 25 MG tablet Take 25 mg by mouth every 6 (six) hours as needed for nausea or vomiting.    [provider]  thyroid (ARMOUR) 120 MG tablet Take 120 mg by mouth daily before breakfast.    [provider]  triamterene-hydrochlorothiazide (MAXZIDE-25) 37.5-25 MG tablet Take 1 tablet by mouth daily. 03/01/20   [provider]  Vitamin D, Ergocalciferol, (DRISDOL) 1.25 MG (50000 UNIT) CAPS capsule Take 50,000 Units by mouth once a week. Tuesday 01/24/20   [provider]  lisinopril (ZESTRIL) 20 MG tablet Take 1 tablet (20 mg total) by mouth daily. 02/10/20 02/10/20  Milton Ferguson, MD      Allergies  Allergen Reactions  . Food Nausea Only    Raw pulp in fruit- can eat cooked.  . Other Nausea And Vomiting and Other (See Comments)    N/V - Enteric Coating  . Adhesive [Tape] Other (See Comments)    SKIN IRRITATION  . Bactrim Nausea And Vomiting  . Clarithromycin Nausea And Vomiting  . Ibuprofen Nausea And Vomiting and Other (See Comments)    SEVERE STOMACH PAIN - CAN TAKE IV WITH NO ISSUES  . Nexium [Esomeprazole] Other (See Comments)    Cramps and headaches  . Nsaids Other (See Comments)    AVOIDS DUE TO GERD  . Peg 3350-Electrolytes Other (See Comments)    Aspiration   . Sulfa Antibiotics Nausea And Vomiting and Other (See Comments)    ORAL MED.   (PER  , IV,  NO ISSUES)  . Tisagenlecleucel Other (See Comments)    Unknown reaction   . Tolmetin Other (See  Comments)    AVOIDS DUE TO GERD  . Latex Rash and Other (See Comments)    SEVERE ITCHING    ROS:  Out of a complete 14 system review of symptoms, the patient complains only of the following symptoms, and all other reviewed systems are negative.  Headache, back pain, bladder control problems Tremor, gait instability Memory problems  Height 5\' 4"  (1.626 m), weight 234 lb (106.1 kg), last menstrual period 02/04/2018.   Blood pressure lying down is 147/87 with a heart rate of 102.  Blood pressure sitting is 133/89 with a heart rate of 104.  Blood pressure standing is 131/81 with a heart rate of 105.  Physical Exam  General: The patient is alert and cooperative at the time of the examination.  The patient is moderately to markedly obese.  Eyes: Pupils are equal, round, and reactive to light. Discs are flat bilaterally.  Neck: The neck is supple, no carotid bruits are noted.  Respiratory: The respiratory examination is clear.  Cardiovascular: The cardiovascular examination reveals a regular rate and rhythm, no obvious murmurs or rubs are noted.  Skin: Extremities are without significant edema.  Neurologic Exam  Mental status: The patient is alert and oriented x 3 at the time of the examination. The patient has apparent normal recent and remote memory, with an apparently normal attention span and concentration ability.  Cranial nerves: Facial symmetry is present. There is good sensation  of the face to pinprick and soft touch on the right face, decreased on the left.  The patient splits the midline on the forehead with vibration sensation, decreased on the left. The strength of the facial muscles and the muscles to head turning and shoulder shrug are normal bilaterally. Speech is well enunciated, no aphasia or dysarthria is noted. Extraocular movements are full. Visual fields are full. The tongue is midline, and the patient has symmetric elevation of the soft palate. No obvious hearing  deficits are noted.  Motor: The motor testing reveals 5 over 5 strength of all 4 extremities. Good symmetric motor tone is noted throughout.  Sensory: Sensory testing is intact to pinprick, soft touch, vibration sensation, and position sense on all 4 extremities, with exception of some decreased pinprick sensation on the left arm and left legs compared to the right, vibration sensation is decreased on the left arm and left leg as compared to the right. No evidence of extinction is noted.  Coordination: Cerebellar testing reveals good finger-nose-finger and heel-to-shin bilaterally.  When drawing a spiral, no tremor is translated into the handwriting.  No observable tremor was seen on clinical examination today.  Gait and station: Gait is normal. Tandem gait is minimally unsteady. Romberg is negative. No drift is seen.  Reflexes: Deep tendon reflexes are symmetric and normal bilaterally.  The ankle jerk reflexes are well-maintained bilaterally.  Toes are downgoing bilaterally.   MRI brain 02/07/19:  IMPRESSION: Normal examination. No abnormality seen to explain the presenting Symptoms.  * MRI scan images were reviewed online. I agree with the written report.   Carotid doppler 12/06/18:  Summary: Right Carotid: Velocities in the right ICA are consistent with a 1-39% stenosis.  Left Carotid: Velocities in the left ICA are consistent with a 1-39% stenosis.  Vertebrals: Bilateral vertebral arteries demonstrate antegrade flow.    Assessment/Plan:  1.  Left hemisensory deficit, nonorganic  2.  Chronic dizziness  3.  Headache, possible migraine  4.  Tremor  5.  Reports of memory disturbance  6.  Chronic low back pain, fibromyalgia  The patient has no observable tremor on clinical examination today.  The patient continues to have a left hemisensory deficit with nonorganic features.  The patient reports dizziness that is unassociated with head position or with standing,  sitting, or lying down.  The patient reports frequent headaches.  She is on a multitude of medications that have anticholinergic side effects and many psychoactive medications and opiate drugs.  The patient will be taken off of the Transderm Scop patch.  She will use Topamax for the headaches, she has noted that this has been helpful in the past.  This may also help tremors.  The patient may have some improvement in her memory coming off of some of the anticholinergic drugs she is on and with initiation of CPAP which has not yet been started.  She will follow-up here in 4 months.  Jill Alexanders MD 10/11/2020 10:26 AM  [facility] [address] [place], [place] [address]  Phone [phone] Fax [phone]

## 2020-10-11 NOTE — Patient Instructions (Signed)
Stop the transderm-Scop patch.  We will start Topamax for the headache and the tremor.  Topamax (topiramate) is a seizure medication that has an FDA approval for seizures and for migraine headache. Potential side effects of this medication include weight loss, cognitive slowing, tingling in the fingers and toes, and carbonated drinks will taste bad. If any significant side effects are noted on this drug, please contact our office.

## 2020-12-20 ENCOUNTER — Institutional Professional Consult (permissible substitution): Payer: Medicaid Other | Admitting: Pulmonary Disease

## 2021-01-15 ENCOUNTER — Ambulatory Visit (INDEPENDENT_AMBULATORY_CARE_PROVIDER_SITE_OTHER): Payer: Medicaid Other | Admitting: Pulmonary Disease

## 2021-01-15 ENCOUNTER — Encounter: Payer: Self-pay | Admitting: Pulmonary Disease

## 2021-01-15 ENCOUNTER — Other Ambulatory Visit: Payer: Self-pay

## 2021-01-15 VITALS — BP 130/86 | HR 115 | Temp 98.3°F | Ht 64.0 in | Wt 219.2 lb

## 2021-01-15 DIAGNOSIS — R0602 Shortness of breath: Secondary | ICD-10-CM

## 2021-01-15 NOTE — Progress Notes (Signed)
Alexis [name]    [id number]    1965-12-22  Primary Care Physician:Osei-Bonsu, Iona Beard, MD  Referring Physician: Jason Coop, DO [address] [place],  [place] [address]  Chief complaint:   Patient being seen for shortness of breath  HPI:  Was following up at [facility] of breath worsening in the last 6 to 8 months  Underlying history of fibromyalgia Hypothyroidism, thyroidectomy related to thyroid cancer Paralyzed vocal cord  Shortness of breath at rest and with exertion  Has used multiple inhalers in the past currently on Symbicort and albuterol as needed  Less than 2 blocks exercise tolerance and sometimes does get short of breath even at rest  Evaluation in 2013 for shortness of breath did reveal normal CT scan of the chest, normal PFT  Has been having symptoms of shortness of breath for over 10 years  Recently diagnosed with moderate obstructive sleep apnea, awaiting CPAP initiation  Barely gets about 20 half hours of sleep at night Goes to bed between 9-930 multiple awakenings  No pertinent occupational history  Never smoker  Weight has been relatively stable  She feels Lyrica, steroid use in the past for shortness of breath contributes to her weight gain  Outpatient Encounter Medications as of 01/15/2021  Medication Sig   acetaminophen (TYLENOL) 500 MG tablet Take 1,000 mg by mouth every 6 (six) hours as needed for headache.   albuterol (PROVENTIL HFA;VENTOLIN HFA) 108 (90 BASE) MCG/ACT inhaler Inhale 2 puffs into the lungs every 4 (four) hours as needed for wheezing or shortness of breath.    atorvastatin (LIPITOR) 20 MG tablet Take 20 mg by mouth daily.   cetirizine (ZYRTEC) 10 MG chewable tablet Chew 10 mg by mouth daily.   Diclofenac Sodium 3 % GEL Apply topically.   dicyclomine (BENTYL) 20 MG tablet Take 1 tablet (20 mg total) by mouth 2 (two) times daily.   DULERA 200-5 MCG/ACT AERO Inhale 2 puffs  into the lungs 2 (two) times daily.   DULoxetine (CYMBALTA) 30 MG capsule Take 60 mg by mouth at bedtime.   hydrOXYzine (ATARAX/VISTARIL) 50 MG tablet Take 50 mg by mouth 4 (four) times daily.   Lidocaine 0.5 % GEL Apply 1 application topically 3 (three) times daily as needed (pain).    LYRICA 75 MG capsule Take 75 mg by mouth 2 (two) times daily.    Melatonin 10 MG CAPS Take 10 mg by mouth at bedtime.    metFORMIN (GLUCOPHAGE) 500 MG tablet Take 500 mg by mouth 2 (two) times daily with a meal.   omeprazole (PRILOSEC) 40 MG capsule Take 40 mg by mouth daily.   ondansetron (ZOFRAN-ODT) 8 MG disintegrating tablet Take 8 mg by mouth every 8 (eight) hours as needed for nausea or vomiting.   OVER THE COUNTER MEDICATION Apply 1 application topically 3 (three) times daily. CBD cream   oxybutynin (DITROPAN XL) 15 MG 24 hr tablet Take 15 mg by mouth daily.   oxycodone (ROXICODONE) 30 MG immediate release tablet Take 30 mg by mouth every 4 (four) hours as needed for pain.   predniSONE (DELTASONE) 20 MG tablet 3 Tabs PO Days 1-3, then 2 tabs PO Days 4-6, then 1 tab PO Day 7-9, then Half Tab PO Day 10-12   promethazine (PHENERGAN) 25 MG tablet Take 25 mg by mouth every 6 (six) hours as needed for nausea or vomiting.   SYMBICORT 160-4.5 MCG/ACT inhaler Inhale into  the lungs.   thyroid (ARMOUR) 120 MG tablet Take 120 mg by mouth daily before breakfast.   tiZANidine (ZANAFLEX) 4 MG tablet Take 4 mg by mouth 3 (three) times daily.   topiramate (TOPAMAX) 25 MG tablet Take one tablet at night for one week, then take 2 tablets at night for one week, then take 3 tablets at night.   triamterene-hydrochlorothiazide (MAXZIDE-25) 37.5-25 MG tablet Take 1 tablet by mouth daily.   verapamil (CALAN-SR) 120 MG CR tablet Take 120 mg by mouth daily.   Vitamin D, Ergocalciferol, (DRISDOL) 1.25 MG (50000 UNIT) CAPS capsule Take 50,000 Units by mouth once a week. Tuesday   promethazine (PHENERGAN) 12.5 MG suppository Place 1  suppository (12.5 mg total) rectally every 6 (six) hours as needed for up to 12 days for nausea or vomiting.   [DISCONTINUED] lisinopril (ZESTRIL) 20 MG tablet Take 1 tablet (20 mg total) by mouth daily.   No facility-administered encounter medications on file as of 01/15/2021.    Allergies as of 01/15/2021 - Review Complete 10/11/2020  Allergen Reaction Noted   Food Nausea Only 02/13/2020   Other Nausea And Vomiting and Other (See Comments) 10/25/2015   Adhesive [tape] Other (See Comments) 09/06/2013   Bactrim Nausea And Vomiting 03/27/2011   Clarithromycin Nausea And Vomiting 09/09/2013   Ibuprofen Nausea And Vomiting and Other (See Comments) 06/22/2011   Nexium [esomeprazole] Other (See Comments) 02/19/2012   Nsaids Other (See Comments) 06/22/2011   Peg 3350-electrolytes Other (See Comments) 01/21/2016   Sulfa antibiotics Nausea And Vomiting and Other (See Comments) 08/05/2005   Tisagenlecleucel Other (See Comments) 05/17/2017   Tolmetin Other (See Comments) 10/25/2015   Trimethoprim Nausea And Vomiting 08/13/2020   Latex Rash and Other (See Comments) 03/27/2011    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[place])    "left"/CT (05/17/2017)   Anemia    ASD (atrial septal defect)    per pt   Asthma    Bilateral dry eyes    Cervical spondylosis    C5-6   CHF (congestive heart failure) ([place])    not sure   Chronic back pain    Chronic bronchitis (HCC)    Chronic colitis    Chronic lower GI bleeding    Complication of anesthesia    has awakened in past during surgery/  LEFT VOCAL CORD PARALYSIS  POST TOTAL THYROIDECTOMY  03-08-2009 ([facility])   Degenerative disk disease    "cervical to sacrum" (05/17/2017)   Depression    Diabetes mellitus without complication ([facility])    Diabetic neuropathy, type II diabetes mellitus ([place]) 12/14/2014   not on medication   DJD (degenerative joint disease) of knee    Dyspnea    Endometriosis 01/18/01   Family history of  adverse reaction to anesthesia    "mom wakes up during procedures"   Fatty liver    Fibromyalgia    Gastric ulcer    GERD (gastroesophageal reflux disease)    Glaucoma of both eyes    H/O hiatal hernia    High cholesterol    History of colon polyps    Hypertension    Hypothyroidism, postsurgical    IBS (irritable bowel syndrome)    IC (interstitial cystitis)    Interstitial cystitis    Lymphedema    Migraine    "monthly" (05/17/2017)  02/06/2019- more frequently   OSA on CPAP    Osteoarthritis    "all over; mainly knees and back" (05/17/2017)  PONV (postoperative nausea and vomiting)    PTSD (post-traumatic stress disorder)    PTSD (post-traumatic stress disorder)    Sickle cell trait (HCC)    Sjogren's syndrome (HCC)    Thyroid cancer ([place])    Tremor 12/14/2014   hand   Uterine fibroid    Varicose veins    Vestibular dizziness    Vocal cord paralysis, unilateral complete    "left";  POST TOTAL THYROIDECTOMY    Past Surgical History:  Procedure Laterality Date   ACHILLES TENDON REPAIR Left 2007   BUNIONECTOMY WITH HAMMERTOE RECONSTRUCTION AND GASTROC SLIDE Left FEB  2011   REMOVAL HARDWARE  NOV  2011   CARDIAC CATHETERIZATION N/A 10/28/2015   Procedure: Right Heart Cath;  Surgeon: Adrian Prows, MD;  Location: [facility];  Service: Cardiovascular;  Laterality: N/A;   COLONOSCOPY WITH ESOPHAGOGASTRODUODENOSCOPY (EGD)  MULTIPLE --  LAST ONE   NOV  2014   CYSTO/  BILATERAL RETROGRADE PYELOGRAM/  HYDRODISTENTION/  INSTILLATION THERAPY  05-08-2003   CYSTO/  HYDRODISTENTION/  INSTILLATION THERAPY  06-04-2005  &  03-04-2007   CYSTO/  URETHRAL DILATION /  BILATERAL UNROOFING URETEROCELE/  BILATERAL URETERAL STENT PLACEMENT  12-01-2002   D & C HYSTEROSCOPY/  REMOVAL IUD  12-06-2003   DE QUERVAIN'S RELEASE Left ?09/2005   DIAGNOSTIC LAPAROSCOPY     DILATATION AND CURETTAGE WITH SUCTION  05-29-2005   RETAINED PLACENTA   DILATION AND CURETTAGE OF UTERUS     DX LAPAROSCOPY/   PELVIC BX'S/  LYSIS ADHESIONS  06-26-2000   FOOT HARDWARE REMOVAL Left 03/2010   REMOVAL HARDWARE "related to earlier hammertoe OR"   HYDRADENITIS EXCISION  06/29/2011   Procedure: EXCISION HYDRADENITIS AXILLA;  Surgeon: Hermelinda Dellen, MD;  Location: [facility];  Service: Plastics;;  right breast hydradenitis excioion   HYDRADENITIS EXCISION Left 2000   INCISION AND DRAINAGE ABSCESS ANAL  02-14-2001   KNEE ARTHROSCOPY WITH LATERAL MENISECTOMY Right 09/08/2013   Procedure: RIGHT KNEE ARTHROSCOPY PARTIAL LATERAL MENISCECTOMY AND DEBRIDEMENT ;  Surgeon: Johnn Hai, MD;  Location: [facility];  Service: Orthopedics;  Laterality: Right;   LAPAROSCOPY ABDOMEN DIAGNOSTIC  SEPT  2003   [place]   AND PLACEMENT IUD   MASS EXCISION  10/29/2003   WIDE EXCISION CHEST AREA   PILONIDAL CYST EXCISION  1983   RADIOLOGY WITH ANESTHESIA N/A 02/07/2019   Procedure: MRI BRAIN WITHOUT IV CONTRAST; MRI CERVICAL, THORACIC, AND LUMBAR SPINE WITH AND WITHOUT CONTRAST;  Surgeon: Radiologist, Medication, MD;  Location: [facility];  Service: Radiology;  Laterality: N/A;   RADIOLOGY WITH ANESTHESIA N/A 02/15/2020   Procedure: MRI LUMBAR WITHOUT CONTRAST;  Surgeon: Radiologist, Medication, MD;  Location: [facility];  Service: Radiology;  Laterality: N/A;   REDUCTION MAMMAPLASTY  1997   TEE WITHOUT CARDIOVERSION N/A 09/26/2015   Procedure: TRANSESOPHAGEAL ECHOCARDIOGRAM (TEE);  Surgeon: Adrian Prows, MD;  Location: [facility];  Service: Cardiovascular;  Laterality: N/A;   TEMPORAL ARTERY BIOPSY / LIGATION Left 2010   TEMPORAL ARTERY BIOPSY / LIGATION Right 06-16-2002   TONSILLECTOMY  1990   TOTAL THYROIDECTOMY  11/06/2008    Family History  Problem Relation Age of Onset   Atrial fibrillation Mother    Diabetes Mother    Thyroid disease Mother    Cushing syndrome Mother    Tremor Mother    Atrial fibrillation Father    Glaucoma Father    Tremor Sister    Cancer Paternal Aunt  breast, colon    Social History   Socioeconomic History   Marital status: Single    Spouse name: Not on file   Number of children: 2   Years of education: 14   Highest education level: Not on file  Occupational History   Occupation: Disabled    Employer: UNEMPLOYED  Tobacco Use   Smoking status: Never   Smokeless tobacco: Never  Vaping Use   Vaping Use: Never used  Substance and Sexual Activity   Alcohol use: Not Currently    Comment: rare   Drug use: No   Sexual activity: Not Currently  Other Topics Concern   Not on file  Social History Narrative   Homeless since March 2021   Patient drinks caffeine occasionally.   Patient is right handed.   Social Determinants of Health   Financial Resource Strain: Not on file  Food Insecurity: Not on file  Transportation Needs: Not on file  Physical Activity: Not on file  Stress: Not on file  Social Connections: Not on file  Intimate Partner Violence: Not on file    Review of Systems  Constitutional:  Positive for fatigue.  Respiratory:  Positive for shortness of breath.   Psychiatric/Behavioral:  Positive for sleep disturbance.    Vitals:   01/15/21 1035  BP: 130/86  Pulse: (!) 115  Temp: 98.3 F (36.8 C)  SpO2: 94%     Physical Exam Constitutional:      Appearance: She is obese.  HENT:     Head: Normocephalic.     Nose: Nose normal. No congestion.  Cardiovascular:     Rate and Rhythm: Normal rate and regular rhythm.     Heart sounds: No murmur heard.   No friction rub.  Pulmonary:     Effort: Pulmonary effort is normal. No respiratory distress.     Breath sounds: No stridor. No wheezing or rhonchi.  Musculoskeletal:     Cervical back: No rigidity or tenderness.  Neurological:     Mental Status: She is alert.  Psychiatric:        Mood and Affect: Mood normal.   Data Reviewed: Sleep study results significant for moderate obstructive sleep apnea  CT scan from 2013 was within normal limits  Pulmonary  function test from 2013 reviewed within normal limits with no obstructive disease  Assessment:  Shortness of breath  Paralyzed vocal cords  Hypothyroidism  Obesity  Obstructive sleep apnea -Pending CPAP treatment  Shortness of breath appears very significant especially with normal pulmonary function test and CT scan, never smoker  History of fibromyalgia and chronic pain  Plan/Recommendations: Obtain echocardiogram  Obtain pulmonary function test  We will continue patient's current inhalers including albuterol and Symbicort  Initiation of treatment of sleep disordered breathing may help generalized fatigue and energy levels  Optimizing pain management  Graded exercise as tolerated  Tentative follow-up in about 6 to 8 weeks  Will consider exercise stress test if studies come back normal  I spent 45 minutes dedicated to the care of this patient on the date of this encounter to include previsit review of records, face-to-face time with the patient discussing conditions above, post visit ordering of testing, clinical documentation with electronic health record and communicated necessary findings to members of the patient's care team  Sherrilyn Rist MD [facility] 01/15/2021, 11:10 AM  CC: Skotnicki, Meghan A, DO

## 2021-01-15 NOTE — Patient Instructions (Signed)
Shortness of breath  Continue current inhalers  Schedule for pulmonary function test Schedule for echocardiogram  We will consider an exercise study following findings from above  [facility] you are able to get your CPAP soon -Treating your sleep apnea may help with energy levels and fatigue  I will see you in 6 to 8 weeks  Call with significant concerns

## 2021-01-27 ENCOUNTER — Other Ambulatory Visit (HOSPITAL_BASED_OUTPATIENT_CLINIC_OR_DEPARTMENT_OTHER): Payer: Self-pay | Admitting: Pulmonary Disease

## 2021-01-27 DIAGNOSIS — R0602 Shortness of breath: Secondary | ICD-10-CM

## 2021-01-31 ENCOUNTER — Other Ambulatory Visit (HOSPITAL_BASED_OUTPATIENT_CLINIC_OR_DEPARTMENT_OTHER): Payer: Medicaid Other

## 2021-01-31 IMAGING — CT CT ABD-PELV W/ CM
2 of 5 series · 17 of 46 positions shown, 19 images · IV contrast (omnipaque)
Comparison: 05/17/2017

CLINICAL DATA: Epigastric pain, acute nonlocalized abdominal pain,
history diabetes mellitus, endometriosis, CHF, fatty liver,
irritable bowel syndrome, interstitial cystitis, hypertension,
fibroids, thyroid cancer

EXAM:
CT ABDOMEN AND PELVIS WITH CONTRAST
TECHNIQUE: Multidetector CT imaging of the abdomen and pelvis was performed
using the standard protocol following bolus administration of
intravenous contrast. Sagittal and coronal MPR images reconstructed
from axial data set.
CONTRAST:  100mL OMNIPAQUE IOHEXOL 300 MG/ML SOLN IV. No oral
contrast.

[Series 3: abd/ pelvis 5.0 i30f 2 · axial · 0.98mm/px · z∈[+788,+1182]mm · 14 of 89 slices shown, 16 images]
[im 5/89  soft-tissue]
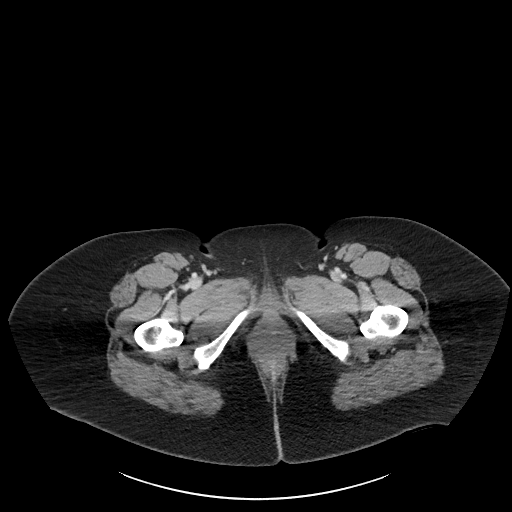
[im 5/89  bone]
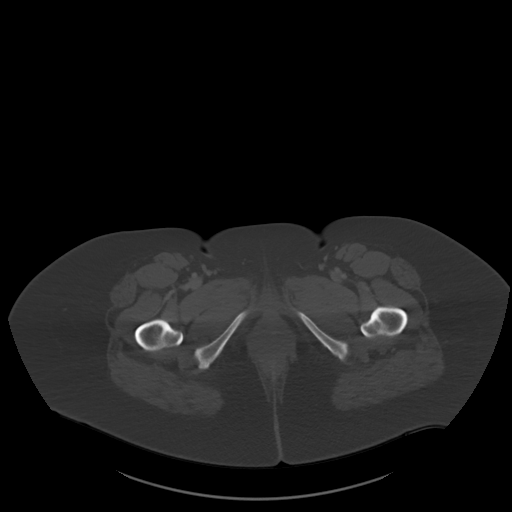
[im 14/89  soft-tissue]
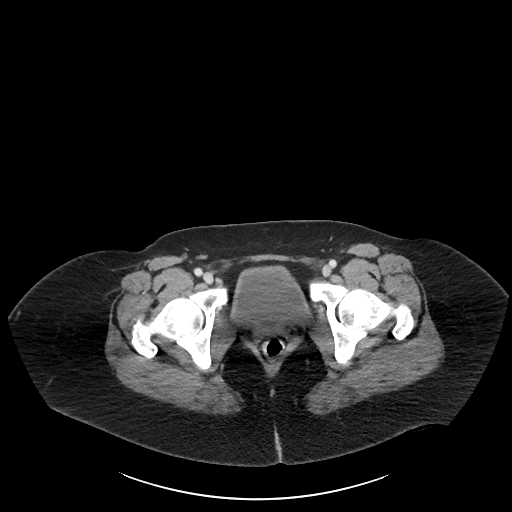
[im 18/89  soft-tissue]
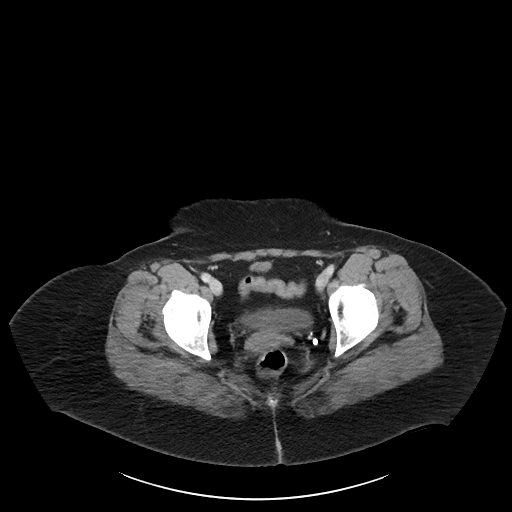
[im 23/89  soft-tissue]
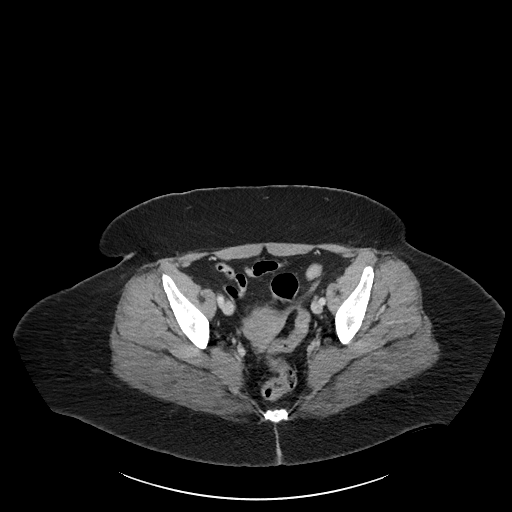
[im 31/89  soft-tissue]
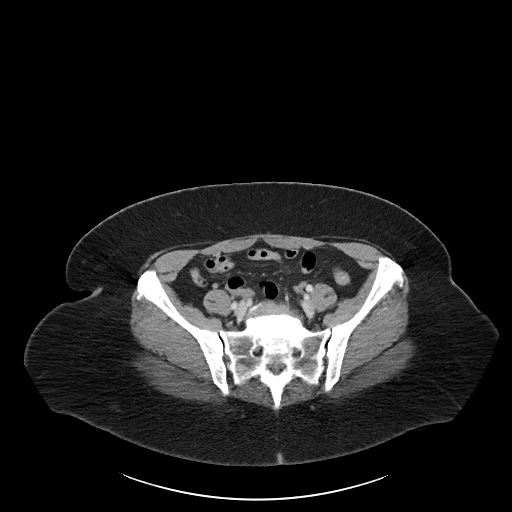
[im 36/89  soft-tissue]
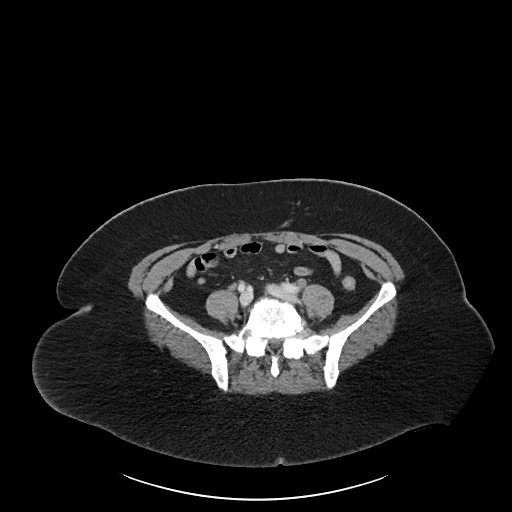
[im 40/89  soft-tissue]
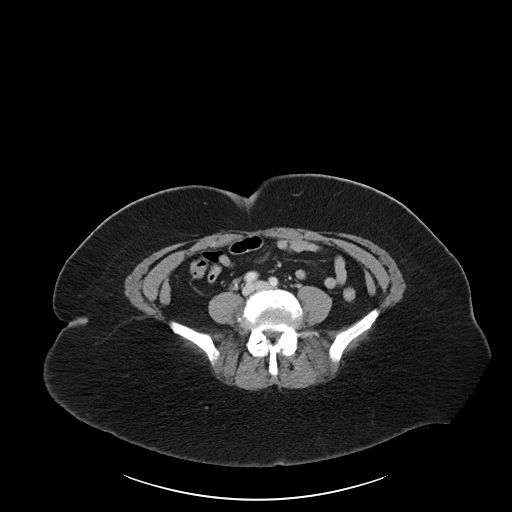
[im 49/89  soft-tissue]
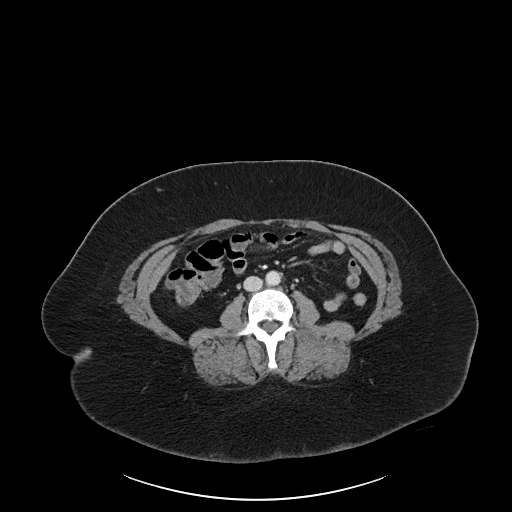
[im 53/89  soft-tissue]
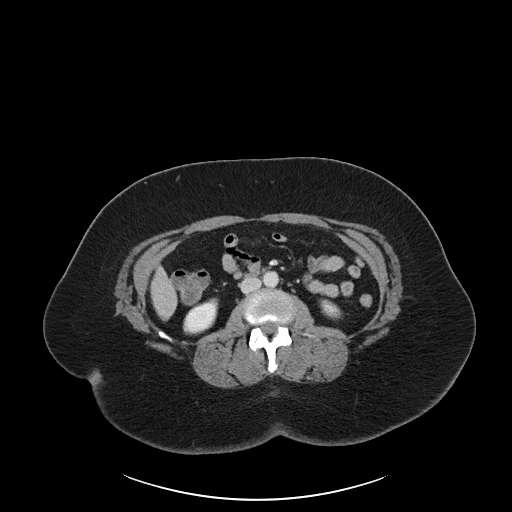
[im 53/89  bone]
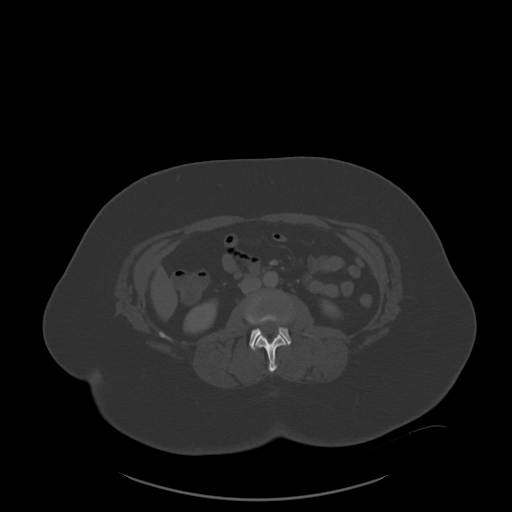
[im 58/89  soft-tissue]
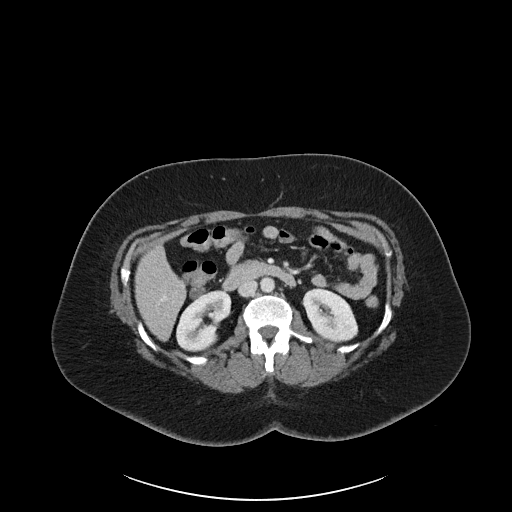
[im 67/89  soft-tissue]
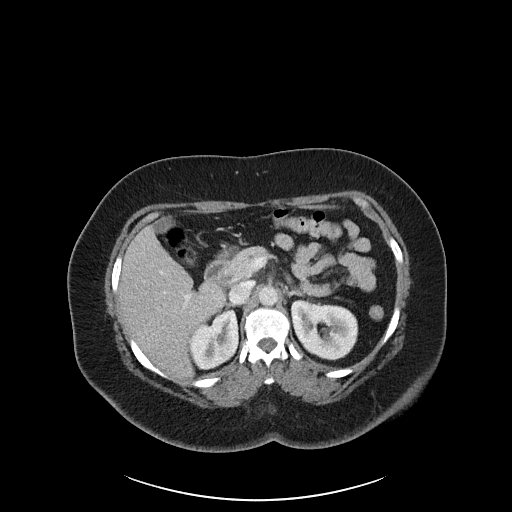
[im 71/89  soft-tissue]
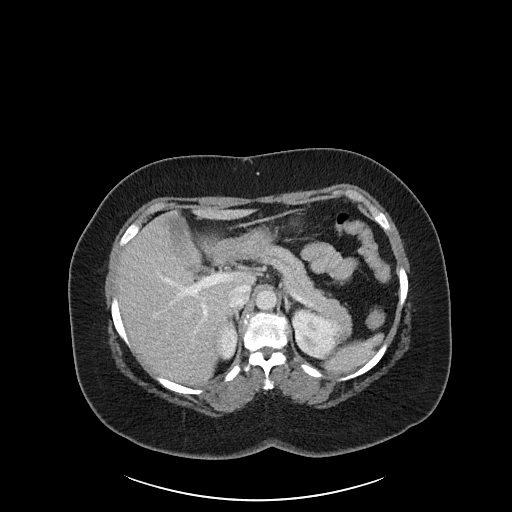
[im 75/89  soft-tissue]
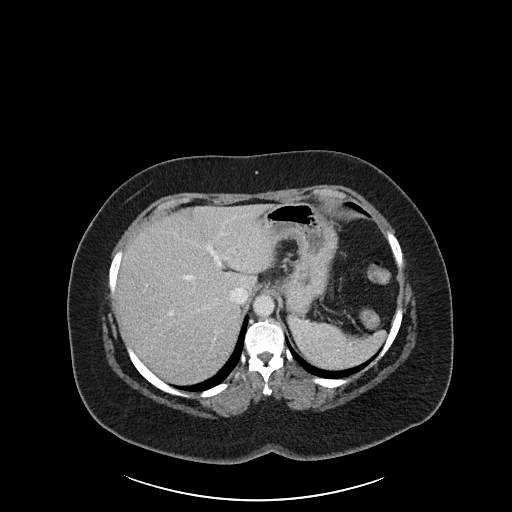
[im 84/89  soft-tissue]
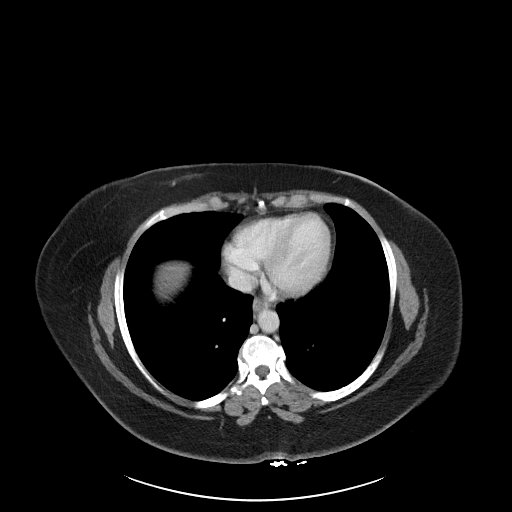

[Series 6: coronal soft tissue · coronal · 0.81mm/px · 3 of 101 slices shown]
[im 34/101  soft-tissue]
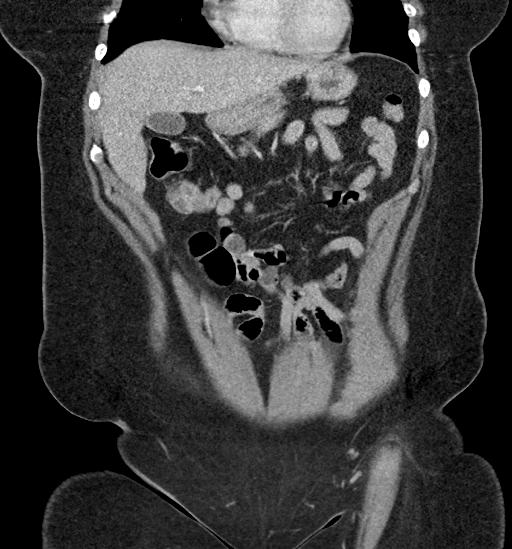
[im 45/101  soft-tissue]
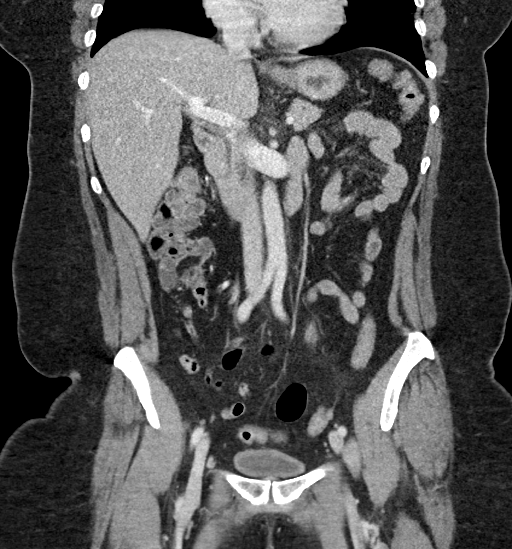
[im 56/101  soft-tissue]
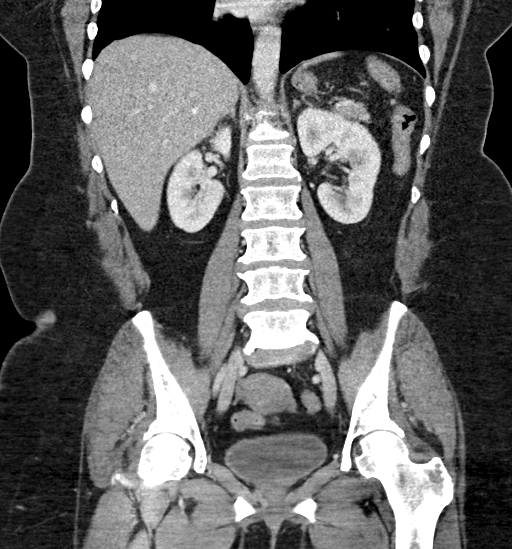

[17 of 46 positions shown; findings below may reference images not displayed]

FINDINGS: Lower chest: Lung bases clear

Hepatobiliary: Gallbladder and liver normal appearance

Pancreas: Normal appearance

Spleen: Normal appearance

Adrenals/Urinary Tract: Small BILATERAL renal cysts, largest LEFT
kidney 2.3 x 1.8 cm image 29, slightly increased. Adrenal glands,
kidneys, ureters, and bladder otherwise normal appearance.

Stomach/Bowel: Normal appendix. Single diverticulum at ascending
colon. Stomach and bowel loops otherwise normal appearance.

Vascular/Lymphatic: Vascular structures patent. No adenopathy. Few
pelvic phleboliths.

Reproductive: Unremarkable uterus and ovaries

Other: No free air or free fluid. No hernia. Minimal stranding in
fat at celiac axis origin, nonspecific.

Musculoskeletal: Degenerative disc and facet disease changes lower
lumbar spine.
IMPRESSION: Small BILATERAL renal cysts.

Minimal nonspecific stranding of fat adjacent to celiac axis origin,
nonspecific, could potentially be related to very subtle
pancreatitis though pancreas is otherwise unremarkable; recommend
correlation with serum lipase.

Remainder of exam normal appearance.

## 2021-02-10 ENCOUNTER — Other Ambulatory Visit: Payer: Self-pay

## 2021-02-10 ENCOUNTER — Ambulatory Visit: Payer: Medicaid Other | Admitting: Adult Health

## 2021-02-10 ENCOUNTER — Encounter: Payer: Self-pay | Admitting: Adult Health

## 2021-02-10 VITALS — BP 116/71 | HR 97 | Ht 64.0 in | Wt 215.6 lb

## 2021-02-10 DIAGNOSIS — M797 Fibromyalgia: Secondary | ICD-10-CM

## 2021-02-10 DIAGNOSIS — G43019 Migraine without aura, intractable, without status migrainosus: Secondary | ICD-10-CM

## 2021-02-10 DIAGNOSIS — M542 Cervicalgia: Secondary | ICD-10-CM

## 2021-02-10 NOTE — Progress Notes (Addendum)
PATIENT: Alexis [name] DOB: 03-25-1966  REASON FOR VISIT: follow up HISTORY FROM: patient PRIMARY NEUROLOGIST: Dr. Jannifer Franklin  HISTORY OF PRESENT ILLNESS: Today 02/10/21 Alexis [name] is a 55 y.o. female being seen today for follow up on her migraines. She saw Dr. Jannifer Franklin in May and was started on Topamax 50 mg at night. She reports tremors in both of her hands, neuropathic pain throughout her body, and having frequent falls. She describes her headaches as a pressure in the left occiput region. Her pain is scored between a 7 on a 1 to 10 scale. Patient has stopped taking the Topamax because she felt that she was "mentally off". She only took the medication for 5 days and still feels off since then. For her headache she takes rapid release Tylenol along with her Oxycodone. She reports that this helps at times, but continues to have the headaches 3-4 days of the week with increasing intensity and occasional radiating pain to the left pinky- although at the end of visit she reports this has happened only once. She has occasional photophobia.  She has yet to receive her CPAP.  HISTORY  Copied from Dr. Jannifer Franklin note 10/11/20:   Alexis [name] is a 55 year old right-handed black female with a history of multiple somatic complaints.  The patient has a history of interstitial cystitis, fibromyalgia, chronic low back pain on opiate medications, diabetes, obesity, thyroid cancer, sleep apnea, not yet on CPAP.  The patient has been seen through this office 2 years ago with problems with tremors since 2011, she has had chronic dizziness associated with a lightheaded sensation, but not true vertigo.  She claims at the dizziness is present with sitting, standing, or lying down.  She reports that the tremor is most notable when she is doing something with her hands, she also indicates that she drops things frequently.  The patient has had minimal change in her handwriting.  She occasionally may have quivering on her lips.   She has chronic problems with left-sided numbness of the face, arms, legs that is felt to be psychogenic in nature.  MRI evaluations of the brain previously were normal.  The patient does report some occasional falls, she last fell about 2 weeks ago.  She reports leg cramps on the left leg, she was recently found to have a hamstring tear and hip joint problems through orthopedic surgery.  The patient has had problems with increased heart rate and constipation on a combination of a Transderm Scop patch, oxybutynin, and Bentyl.  She recently was taken off of her metoprolol.  The patient claims to have headaches 2-3 times a week, usually in the back of the head.  She has a lot of nausea and takes Zofran and Phenergan frequently.  She reports that she will occasionally have some dizziness with the headache, she does not relate her tremors or her dizziness to any medications that were started in the past.  She is on Lyrica 75 mg twice daily.  She is sent to this office for further evaluation.  REVIEW OF SYSTEMS: Out of a complete 14 system review of symptoms, the patient complains only of the following symptoms, and all other reviewed systems are negative.  ALLERGIES: Allergies  Allergen Reactions   Food Nausea Only    Raw pulp in fruit- can eat cooked.   Other Nausea And Vomiting and Other (See Comments)    N/V - Enteric Coating   Adhesive [Tape] Other (See Comments)    SKIN IRRITATION  Bactrim Nausea And Vomiting   Clarithromycin Nausea And Vomiting   Ibuprofen Nausea And Vomiting and Other (See Comments)    SEVERE STOMACH PAIN - CAN TAKE IV WITH NO ISSUES   Nexium [Esomeprazole] Other (See Comments)    Cramps and headaches   Nsaids Other (See Comments)    AVOIDS DUE TO GERD   Peg 3350-Electrolytes Other (See Comments)    Aspiration    Sulfa Antibiotics Nausea And Vomiting and Other (See Comments)    ORAL MED.   (PER  , IV,  NO ISSUES)   Tisagenlecleucel Other (See Comments)    Unknown  reaction    Tolmetin Other (See Comments)    AVOIDS DUE TO GERD   Trimethoprim Nausea And Vomiting    Other reaction(s): Nausea/Vomiting   Latex Rash and Other (See Comments)    SEVERE ITCHING    HOME MEDICATIONS: Outpatient Medications Prior to Visit  Medication Sig Dispense Refill   acetaminophen (TYLENOL) 500 MG tablet Take 1,000 mg by mouth every 6 (six) hours as needed for headache.     albuterol (PROVENTIL HFA;VENTOLIN HFA) 108 (90 BASE) MCG/ACT inhaler Inhale 2 puffs into the lungs every 4 (four) hours as needed for wheezing or shortness of breath.      atorvastatin (LIPITOR) 20 MG tablet Take 20 mg by mouth daily.     cetirizine (ZYRTEC) 10 MG chewable tablet Chew 10 mg by mouth daily.     Diclofenac Sodium 3 % GEL Apply topically.     dicyclomine (BENTYL) 20 MG tablet Take 1 tablet (20 mg total) by mouth 2 (two) times daily. 20 tablet 0   DULERA 200-5 MCG/ACT AERO Inhale 2 puffs into the lungs 2 (two) times daily.  0   DULoxetine (CYMBALTA) 30 MG capsule Take 60 mg by mouth at bedtime.     hydrOXYzine (ATARAX/VISTARIL) 50 MG tablet Take 50 mg by mouth 4 (four) times daily.  3   Lidocaine 0.5 % GEL Apply 1 application topically 3 (three) times daily as needed (pain).      LYRICA 75 MG capsule Take 75 mg by mouth 2 (two) times daily.   0   Melatonin 10 MG CAPS Take 10 mg by mouth at bedtime.      metFORMIN (GLUCOPHAGE) 500 MG tablet Take 500 mg by mouth 2 (two) times daily with a meal.     omeprazole (PRILOSEC) 40 MG capsule Take 40 mg by mouth daily.     ondansetron (ZOFRAN-ODT) 8 MG disintegrating tablet Take 8 mg by mouth every 8 (eight) hours as needed for nausea or vomiting.     OVER THE COUNTER MEDICATION Apply 1 application topically 3 (three) times daily. CBD cream     oxybutynin (DITROPAN XL) 15 MG 24 hr tablet Take 15 mg by mouth daily.     oxycodone (ROXICODONE) 30 MG immediate release tablet Take 30 mg by mouth every 4 (four) hours as needed for pain.     predniSONE  (DELTASONE) 20 MG tablet 3 Tabs PO Days 1-3, then 2 tabs PO Days 4-6, then 1 tab PO Day 7-9, then Half Tab PO Day 10-12 20 tablet 0   promethazine (PHENERGAN) 12.5 MG suppository Place 1 suppository (12.5 mg total) rectally every 6 (six) hours as needed for up to 12 days for nausea or vomiting. 12 suppository 0   promethazine (PHENERGAN) 25 MG tablet Take 25 mg by mouth every 6 (six) hours as needed for nausea or vomiting.     SYMBICORT  160-4.5 MCG/ACT inhaler Inhale into the lungs.     thyroid (ARMOUR) 120 MG tablet Take 120 mg by mouth daily before breakfast.     tiZANidine (ZANAFLEX) 4 MG tablet Take 4 mg by mouth 3 (three) times daily.     topiramate (TOPAMAX) 25 MG tablet Take one tablet at night for one week, then take 2 tablets at night for one week, then take 3 tablets at night. 90 tablet 3   triamterene-hydrochlorothiazide (MAXZIDE-25) 37.5-25 MG tablet Take 1 tablet by mouth daily.     verapamil (CALAN-SR) 120 MG CR tablet Take 120 mg by mouth daily.     Vitamin D, Ergocalciferol, (DRISDOL) 1.25 MG (50000 UNIT) CAPS capsule Take 50,000 Units by mouth once a week. Tuesday     No facility-administered medications prior to visit.    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[place])    "left"/CT (05/17/2017)   Anemia    ASD (atrial septal defect)    per pt   Asthma    Bilateral dry eyes    Cervical spondylosis    C5-6   CHF (congestive heart failure) (HCC)    not sure   Chronic back pain    Chronic bronchitis (HCC)    Chronic colitis    Chronic lower GI bleeding    Complication of anesthesia    has awakened in past during surgery/  LEFT VOCAL CORD PARALYSIS  POST TOTAL THYROIDECTOMY  03-08-2009 ([facility])   Degenerative disk disease    "cervical to sacrum" (05/17/2017)   Depression    Diabetes mellitus without complication (HCC)    Diabetic neuropathy, type II diabetes mellitus ([place]) 12/14/2014   not on medication   DJD  (degenerative joint disease) of knee    Dyspnea    Endometriosis 01/18/01   Family history of adverse reaction to anesthesia    "mom wakes up during procedures"   Fatty liver    Fibromyalgia    Gastric ulcer    GERD (gastroesophageal reflux disease)    Glaucoma of both eyes    H/O hiatal hernia    High cholesterol    History of colon polyps    Hypertension    Hypothyroidism, postsurgical    IBS (irritable bowel syndrome)    IC (interstitial cystitis)    Interstitial cystitis    Lymphedema    Migraine    "monthly" (05/17/2017)  02/06/2019- more frequently   OSA on CPAP    Osteoarthritis    "all over; mainly knees and back" (05/17/2017)   PONV (postoperative nausea and vomiting)    PTSD (post-traumatic stress disorder)    PTSD (post-traumatic stress disorder)    Sickle cell trait (HCC)    Sjogren's syndrome ([place])    Thyroid cancer ([place])    Tremor 12/14/2014   hand   Uterine fibroid    Varicose veins    Vestibular dizziness    Vocal cord paralysis, unilateral complete    "left";  POST TOTAL THYROIDECTOMY    PAST SURGICAL HISTORY: Past Surgical History:  Procedure Laterality Date   ACHILLES TENDON REPAIR Left 2007   BUNIONECTOMY WITH HAMMERTOE RECONSTRUCTION AND GASTROC SLIDE Left FEB  2011   REMOVAL HARDWARE  NOV  2011   CARDIAC CATHETERIZATION N/A 10/28/2015   Procedure: Right Heart Cath;  Surgeon: Adrian Prows, MD;  Location: [facility];  Service: Cardiovascular;  Laterality: N/A;   COLONOSCOPY WITH ESOPHAGOGASTRODUODENOSCOPY (EGD)  MULTIPLE --  LAST  ONE   NOV  2014   CYSTO/  BILATERAL RETROGRADE PYELOGRAM/  HYDRODISTENTION/  INSTILLATION THERAPY  05-08-2003   CYSTO/  HYDRODISTENTION/  INSTILLATION THERAPY  06-04-2005  &  03-04-2007   CYSTO/  URETHRAL DILATION /  BILATERAL UNROOFING URETEROCELE/  BILATERAL URETERAL STENT PLACEMENT  12-01-2002   D & C HYSTEROSCOPY/  REMOVAL IUD  12-06-2003   DE QUERVAIN'S RELEASE Left ?09/2005   DIAGNOSTIC LAPAROSCOPY      DILATATION AND CURETTAGE WITH SUCTION  05-29-2005   RETAINED PLACENTA   DILATION AND CURETTAGE OF UTERUS     DX LAPAROSCOPY/  PELVIC BX'S/  LYSIS ADHESIONS  06-26-2000   FOOT HARDWARE REMOVAL Left 03/2010   REMOVAL HARDWARE "related to earlier [facility] OR"   HYDRADENITIS EXCISION  06/29/2011   Procedure: EXCISION HYDRADENITIS AXILLA;  Surgeon: Hermelinda Dellen, MD;  Location: [facility];  Service: Plastics;;  right breast hydradenitis excioion   HYDRADENITIS EXCISION Left 2000   INCISION AND DRAINAGE ABSCESS ANAL  02-14-2001   KNEE ARTHROSCOPY WITH LATERAL MENISECTOMY Right 09/08/2013   Procedure: RIGHT KNEE ARTHROSCOPY PARTIAL LATERAL MENISCECTOMY AND DEBRIDEMENT ;  Surgeon: Johnn Hai, MD;  Location: [facility];  Service: Orthopedics;  Laterality: Right;   LAPAROSCOPY ABDOMEN DIAGNOSTIC  SEPT  2003   [place]   AND PLACEMENT IUD   MASS EXCISION  10/29/2003   WIDE EXCISION CHEST AREA   PILONIDAL CYST EXCISION  1983   RADIOLOGY WITH ANESTHESIA N/A 02/07/2019   Procedure: MRI BRAIN WITHOUT IV CONTRAST; MRI CERVICAL, THORACIC, AND LUMBAR SPINE WITH AND WITHOUT CONTRAST;  Surgeon: Radiologist, Medication, MD;  Location: [facility];  Service: Radiology;  Laterality: N/A;   RADIOLOGY WITH ANESTHESIA N/A 02/15/2020   Procedure: MRI LUMBAR WITHOUT CONTRAST;  Surgeon: Radiologist, Medication, MD;  Location: [facility];  Service: Radiology;  Laterality: N/A;   REDUCTION MAMMAPLASTY  1997   TEE WITHOUT CARDIOVERSION N/A 09/26/2015   Procedure: TRANSESOPHAGEAL ECHOCARDIOGRAM (TEE);  Surgeon: Adrian Prows, MD;  Location: [facility];  Service: Cardiovascular;  Laterality: N/A;   TEMPORAL ARTERY BIOPSY / LIGATION Left 2010   TEMPORAL ARTERY BIOPSY / LIGATION Right 06-16-2002   TONSILLECTOMY  1990   TOTAL THYROIDECTOMY  11/06/2008    FAMILY HISTORY: Family History  Problem Relation Age of Onset   Atrial fibrillation Mother    Diabetes Mother    Thyroid disease Mother     Cushing syndrome Mother    Tremor Mother    Atrial fibrillation Father    Glaucoma Father    Tremor Sister    Cancer Paternal Aunt        breast, colon    SOCIAL HISTORY: Social History   Socioeconomic History   Marital status: Single    Spouse name: Not on file   Number of children: 2   Years of education: 14   Highest education level: Not on file  Occupational History   Occupation: Disabled    Employer: UNEMPLOYED  Tobacco Use   Smoking status: Never   Smokeless tobacco: Never  Vaping Use   Vaping Use: Never used  Substance and Sexual Activity   Alcohol use: Not Currently    Comment: rare   Drug use: No   Sexual activity: Not Currently  Other Topics Concern   Not on file  Social History Narrative   Homeless since March 2021   Patient drinks caffeine occasionally.   Patient is right handed.   Social Determinants of Radio broadcast assistant  Strain: Not on file  Food Insecurity: Not on file  Transportation Needs: Not on file  Physical Activity: Not on file  Stress: Not on file  Social Connections: Not on file  Intimate Partner Violence: Not on file    PHYSICAL EXAM  Vitals:   02/10/21 1425  BP: 116/71  Pulse: 97  Weight: 215 lb 9.6 oz (97.8 kg)  Height: 5\' 4"  (1.626 m)   Body mass index is 37.01 kg/m.  Generalized: Well developed, in no acute distress   Neurological examination  Mentation: Alert oriented to time, place, history taking. Follows all commands speech and language fluent Cranial nerve II-XII: Pupils were equal round reactive to light. Extraocular movements were full, visual field were full on confrontational test. Facial sensation and strength were normal. Uvula tongue midline. Head turning and shoulder shrug  were normal and symmetric. Motor: The motor testing reveals 5 over 5 strength of all 4 extremities. Good symmetric motor tone is noted throughout.  Sensory: Sensory testing is intact to soft touch on all 4 extremities. No evidence  of extinction is noted.  Coordination: Cerebellar testing reveals good finger-nose-finger and heel-to-shin bilaterally. Slowed rapid alternating movements. Spiral test showed no organic tremors. Reflexes: Deep tendon reflexes are symmetric and normal bilaterally.   DIAGNOSTIC DATA (LABS, IMAGING, TESTING) - I reviewed patient records, labs, notes, testing and imaging myself where available.  Lab Results  Component Value Date   WBC 9.8 06/23/2020   HGB 12.3 06/23/2020   HCT 36.6 06/23/2020   MCV 85.1 06/23/2020   PLT 381 06/23/2020      Component Value Date/Time   NA 141 06/23/2020 1335   K 3.6 06/23/2020 1335   CL 101 06/23/2020 1335   CO2 29 06/23/2020 1335   GLUCOSE 115 (H) 06/23/2020 1335   BUN 9 06/23/2020 1335   CREATININE 0.81 06/23/2020 1335   CALCIUM 9.3 06/23/2020 1335   PROT 8.0 06/12/2020 1001   ALBUMIN 4.4 06/12/2020 1001   AST 15 06/12/2020 1001   ALT 15 06/12/2020 1001   ALKPHOS 76 06/12/2020 1001   BILITOT 0.7 06/12/2020 1001   GFRNONAA >60 06/23/2020 1335   GFRAA >60 02/10/2020 1513   No results found for: CHOL, HDL, LDLCALC, LDLDIRECT, TRIG, CHOLHDL No results found for: HGBA1C Lab Results  Component Value Date   VITAMINB12 420 11/23/2018   No results found for: TSH  ASSESSMENT AND PLAN 55 y.o. 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[place]), Anemia, ASD (atrial septal defect), Asthma, Bilateral dry eyes, Cervical spondylosis, CHF (congestive heart failure) ([place]), Chronic back pain, Chronic bronchitis ([place]), Chronic colitis, Chronic lower GI bleeding, Complication of anesthesia, Degenerative disk disease, Depression, Diabetes mellitus without complication ([place]), Diabetic neuropathy, type II diabetes mellitus ([place]) (12/14/2014), DJD (degenerative joint disease) of knee, Dyspnea, Endometriosis (01/18/01), Family history of adverse reaction to anesthesia, Fatty liver, Fibromyalgia, Gastric ulcer, GERD (gastroesophageal reflux disease),  Glaucoma of both eyes, H/O hiatal hernia, High cholesterol, History of colon polyps, Hypertension, Hypothyroidism, postsurgical, IBS (irritable bowel syndrome), IC (interstitial cystitis), Interstitial cystitis, Lymphedema, Migraine, OSA on CPAP, Osteoarthritis, PONV (postoperative nausea and vomiting), PTSD (post-traumatic stress disorder), PTSD (post-traumatic stress disorder), Sickle cell trait ([place]), Sjogren's syndrome ([place]), Thyroid cancer ([place]), Tremor (12/14/2014), Uterine fibroid, Varicose veins, Vestibular dizziness, and Vocal cord paralysis, unilateral complete. here with    1: Migraines  -Continue tylenol for headache -Could not tolerate Topamax -On multiple different medications will not start anything new today -Currently followed by pain  management at [facility]   2: Neck pain on left side  -MRI of cervical spine w/wo  ordered. -If approved by insurance company will give the patient Xanax to take for her scan. -If scan is relatively unremarkable patient will continue to follow-up at [facility] for pain management   Ward Givens, MSN, NP-C 02/10/2021, 2:10 PM [facility] [address] [place], [place] [address] [phone]

## 2021-02-10 NOTE — Progress Notes (Signed)
I have read the note, and I agree with the clinical assessment and plan.  Malik Ruffino K Abdulai Blaylock   

## 2021-02-10 NOTE — Progress Notes (Signed)
PATIENT: Alexis [name] DOB: 1966/01/05  REASON FOR VISIT: follow up HISTORY FROM: patient PRIMARY NEUROLOGIST: Dr. Jannifer Franklin  HISTORY OF PRESENT ILLNESS: Today 02/10/21 Alexis [name] is a 55 y.o. female being seen today for follow up on her migraines. She saw Dr. Jannifer Franklin in May and was started on Topamax 50 mg at night. She reports tremors in both of her hands, neuropathic pain throughout her body, and having frequent falls. She describes her headaches as a pressure in the left occiput region. Her pain is scored between a 7 on a 1 to 10 scale. Patient has stopped taking the Topamax because she felt that she was "mentally off". She only took the medication for 5 days and still feels off since then. For her headache she takes rapid release Tylenol along with her Oxycodone. She reports that this helps at times, but continues to have the headaches 3-4 days of the week with increasing intensity and occasional radiating pain to the left pinky- although at the end of visit she reports this has happened only once. She has occasional photophobia.  She has yet to receive her CPAP.  HISTORY  Copied from Dr. Jannifer Franklin note 10/11/20:   Alexis [name] is a 55 year old right-handed black female with a history of multiple somatic complaints.  The patient has a history of interstitial cystitis, fibromyalgia, chronic low back pain on opiate medications, diabetes, obesity, thyroid cancer, sleep apnea, not yet on CPAP.  The patient has been seen through this office 2 years ago with problems with tremors since 2011, she has had chronic dizziness associated with a lightheaded sensation, but not true vertigo.  She claims at the dizziness is present with sitting, standing, or lying down.  She reports that the tremor is most notable when she is doing something with her hands, she also indicates that she drops things frequently.  The patient has had minimal change in her handwriting.  She occasionally may have quivering on her lips.   She has chronic problems with left-sided numbness of the face, arms, legs that is felt to be psychogenic in nature.  MRI evaluations of the brain previously were normal.  The patient does report some occasional falls, she last fell about 2 weeks ago.  She reports leg cramps on the left leg, she was recently found to have a hamstring tear and hip joint problems through orthopedic surgery.  The patient has had problems with increased heart rate and constipation on a combination of a Transderm Scop patch, oxybutynin, and Bentyl.  She recently was taken off of her metoprolol.  The patient claims to have headaches 2-3 times a week, usually in the back of the head.  She has a lot of nausea and takes Zofran and Phenergan frequently.  She reports that she will occasionally have some dizziness with the headache, she does not relate her tremors or her dizziness to any medications that were started in the past.  She is on Lyrica 75 mg twice daily.  She is sent to this office for further evaluation.  REVIEW OF SYSTEMS: Out of a complete 14 system review of symptoms, the patient complains only of the following symptoms, and all other reviewed systems are negative.  ALLERGIES: Allergies  Allergen Reactions   Food Nausea Only    Raw pulp in fruit- can eat cooked.   Other Nausea And Vomiting and Other (See Comments)    N/V - Enteric Coating   Adhesive [Tape] Other (See Comments)    SKIN IRRITATION  Bactrim Nausea And Vomiting   Clarithromycin Nausea And Vomiting   Ibuprofen Nausea And Vomiting and Other (See Comments)    SEVERE STOMACH PAIN - CAN TAKE IV WITH NO ISSUES   Nexium [Esomeprazole] Other (See Comments)    Cramps and headaches   Nsaids Other (See Comments)    AVOIDS DUE TO GERD   Peg 3350-Electrolytes Other (See Comments)    Aspiration    Sulfa Antibiotics Nausea And Vomiting and Other (See Comments)    ORAL MED.   (PER  , IV,  NO ISSUES)   Tisagenlecleucel Other (See Comments)    Unknown  reaction    Tolmetin Other (See Comments)    AVOIDS DUE TO GERD   Trimethoprim Nausea And Vomiting    Other reaction(s): Nausea/Vomiting   Latex Rash and Other (See Comments)    SEVERE ITCHING    HOME MEDICATIONS: Outpatient Medications Prior to Visit  Medication Sig Dispense Refill   acetaminophen (TYLENOL) 500 MG tablet Take 1,000 mg by mouth every 6 (six) hours as needed for headache.     albuterol (PROVENTIL HFA;VENTOLIN HFA) 108 (90 BASE) MCG/ACT inhaler Inhale 2 puffs into the lungs every 4 (four) hours as needed for wheezing or shortness of breath.      atorvastatin (LIPITOR) 20 MG tablet Take 20 mg by mouth daily.     buprenorphine (BUTRANS) 10 MCG/HR PTWK Place 1 patch onto the skin once a week.     cetirizine (ZYRTEC) 10 MG chewable tablet Chew 10 mg by mouth daily.     Diclofenac Sodium 3 % GEL Apply topically.     dicyclomine (BENTYL) 20 MG tablet Take 1 tablet (20 mg total) by mouth 2 (two) times daily. 20 tablet 0   DULERA 200-5 MCG/ACT AERO Inhale 2 puffs into the lungs 2 (two) times daily.  0   DULoxetine (CYMBALTA) 30 MG capsule Take 60 mg by mouth at bedtime.     hydrOXYzine (ATARAX/VISTARIL) 50 MG tablet Take 50 mg by mouth 4 (four) times daily.  3   Lidocaine 0.5 % GEL Apply 1 application topically 3 (three) times daily as needed (pain).      LYRICA 75 MG capsule Take 75 mg by mouth 2 (two) times daily.   0   Melatonin 10 MG CAPS Take 10 mg by mouth at bedtime.      metFORMIN (GLUCOPHAGE) 500 MG tablet Take 500 mg by mouth 2 (two) times daily with a meal.     omeprazole (PRILOSEC) 40 MG capsule Take 40 mg by mouth daily.     ondansetron (ZOFRAN-ODT) 8 MG disintegrating tablet Take 8 mg by mouth every 8 (eight) hours as needed for nausea or vomiting.     OVER THE COUNTER MEDICATION Apply 1 application topically 3 (three) times daily. CBD cream     oxybutynin (DITROPAN XL) 15 MG 24 hr tablet Take 15 mg by mouth daily.     oxycodone (ROXICODONE) 30 MG immediate release  tablet Take 30 mg by mouth every 4 (four) hours as needed for pain.     promethazine (PHENERGAN) 25 MG tablet Take 25 mg by mouth every 6 (six) hours as needed for nausea or vomiting.     SYMBICORT 160-4.5 MCG/ACT inhaler Inhale into the lungs.     thyroid (ARMOUR) 120 MG tablet Take 120 mg by mouth daily before breakfast.     tiZANidine (ZANAFLEX) 4 MG tablet Take 4 mg by mouth 3 (three) times daily.     topiramate (  TOPAMAX) 25 MG tablet Take one tablet at night for one week, then take 2 tablets at night for one week, then take 3 tablets at night. 90 tablet 3   triamterene-hydrochlorothiazide (MAXZIDE-25) 37.5-25 MG tablet Take 1 tablet by mouth daily.     verapamil (CALAN-SR) 120 MG CR tablet Take 120 mg by mouth daily.     Vitamin D, Ergocalciferol, (DRISDOL) 1.25 MG (50000 UNIT) CAPS capsule Take 50,000 Units by mouth once a week. Tuesday     predniSONE (DELTASONE) 20 MG tablet 3 Tabs PO Days 1-3, then 2 tabs PO Days 4-6, then 1 tab PO Day 7-9, then Half Tab PO Day 10-12 20 tablet 0   promethazine (PHENERGAN) 12.5 MG suppository Place 1 suppository (12.5 mg total) rectally every 6 (six) hours as needed for up to 12 days for nausea or vomiting. 12 suppository 0   No facility-administered medications prior to visit.    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[place])    "left"/CT (05/17/2017)   Anemia    ASD (atrial septal defect)    per pt   Asthma    Bilateral dry eyes    Cervical spondylosis    C5-6   CHF (congestive heart failure) (HCC)    not sure   Chronic back pain    Chronic bronchitis (HCC)    Chronic colitis    Chronic lower GI bleeding    Complication of anesthesia    has awakened in past during surgery/  LEFT VOCAL CORD PARALYSIS  POST TOTAL THYROIDECTOMY  03-08-2009 ([facility])   Degenerative disk disease    "cervical to sacrum" (05/17/2017)   Depression    Diabetes mellitus without complication (HCC)    Diabetic  neuropathy, type II diabetes mellitus ([place]) 12/14/2014   not on medication   DJD (degenerative joint disease) of knee    Dyspnea    Endometriosis 01/18/01   Family history of adverse reaction to anesthesia    "mom wakes up during procedures"   Fatty liver    Fibromyalgia    Gastric ulcer    GERD (gastroesophageal reflux disease)    Glaucoma of both eyes    H/O hiatal hernia    High cholesterol    History of colon polyps    Hypertension    Hypothyroidism, postsurgical    IBS (irritable bowel syndrome)    IC (interstitial cystitis)    Interstitial cystitis    Lymphedema    Migraine    "monthly" (05/17/2017)  02/06/2019- more frequently   OSA on CPAP    Osteoarthritis    "all over; mainly knees and back" (05/17/2017)   PONV (postoperative nausea and vomiting)    PTSD (post-traumatic stress disorder)    PTSD (post-traumatic stress disorder)    Sickle cell trait (HCC)    Sjogren's syndrome ([place])    Thyroid cancer ([place])    Tremor 12/14/2014   hand   Uterine fibroid    Varicose veins    Vestibular dizziness    Vocal cord paralysis, unilateral complete    "left";  POST TOTAL THYROIDECTOMY    PAST SURGICAL HISTORY: Past Surgical History:  Procedure Laterality Date   ACHILLES TENDON REPAIR Left 2007   BUNIONECTOMY WITH HAMMERTOE RECONSTRUCTION AND GASTROC SLIDE Left FEB  2011   REMOVAL HARDWARE  NOV  2011   CARDIAC CATHETERIZATION N/A 10/28/2015   Procedure: Right Heart Cath;  Surgeon: Adrian Prows, MD;  Location[facility] INVASIVE CV  LAB;  Service: Cardiovascular;  Laterality: N/A;   COLONOSCOPY WITH ESOPHAGOGASTRODUODENOSCOPY (EGD)  MULTIPLE --  LAST ONE   NOV  2014   CYSTO/  BILATERAL RETROGRADE PYELOGRAM/  HYDRODISTENTION/  INSTILLATION THERAPY  05-08-2003   CYSTO/  HYDRODISTENTION/  INSTILLATION THERAPY  06-04-2005  &  03-04-2007   CYSTO/  URETHRAL DILATION /  BILATERAL UNROOFING URETEROCELE/  BILATERAL URETERAL STENT PLACEMENT  12-01-2002   D & C HYSTEROSCOPY/  REMOVAL IUD   12-06-2003   DE QUERVAIN'S RELEASE Left ?09/2005   DIAGNOSTIC LAPAROSCOPY     DILATATION AND CURETTAGE WITH SUCTION  05-29-2005   RETAINED PLACENTA   DILATION AND CURETTAGE OF UTERUS     DX LAPAROSCOPY/  PELVIC BX'S/  LYSIS ADHESIONS  06-26-2000   FOOT HARDWARE REMOVAL Left 03/2010   REMOVAL HARDWARE "related to earlier hammertoe OR"   HYDRADENITIS EXCISION  06/29/2011   Procedure: EXCISION HYDRADENITIS AXILLA;  Surgeon: Hermelinda Dellen, MD;  Location: [facility];  Service: Plastics;;  right breast hydradenitis excioion   HYDRADENITIS EXCISION Left 2000   INCISION AND DRAINAGE ABSCESS ANAL  02-14-2001   KNEE ARTHROSCOPY WITH LATERAL MENISECTOMY Right 09/08/2013   Procedure: RIGHT KNEE ARTHROSCOPY PARTIAL LATERAL MENISCECTOMY AND DEBRIDEMENT ;  Surgeon: Johnn Hai, MD;  Location: [facility];  Service: Orthopedics;  Laterality: Right;   LAPAROSCOPY ABDOMEN DIAGNOSTIC  SEPT  2003   [place]   AND PLACEMENT IUD   MASS EXCISION  10/29/2003   WIDE EXCISION CHEST AREA   PILONIDAL CYST EXCISION  1983   RADIOLOGY WITH ANESTHESIA N/A 02/07/2019   Procedure: MRI BRAIN WITHOUT IV CONTRAST; MRI CERVICAL, THORACIC, AND LUMBAR SPINE WITH AND WITHOUT CONTRAST;  Surgeon: Radiologist, Medication, MD;  Location: [facility];  Service: Radiology;  Laterality: N/A;   RADIOLOGY WITH ANESTHESIA N/A 02/15/2020   Procedure: MRI LUMBAR WITHOUT CONTRAST;  Surgeon: Radiologist, Medication, MD;  Location: [facility];  Service: Radiology;  Laterality: N/A;   REDUCTION MAMMAPLASTY  1997   TEE WITHOUT CARDIOVERSION N/A 09/26/2015   Procedure: TRANSESOPHAGEAL ECHOCARDIOGRAM (TEE);  Surgeon: Adrian Prows, MD;  Location: [facility] ENDOSCOPY;  Service: Cardiovascular;  Laterality: N/A;   TEMPORAL ARTERY BIOPSY / LIGATION Left 2010   TEMPORAL ARTERY BIOPSY / LIGATION Right 06-16-2002   TONSILLECTOMY  1990   TOTAL THYROIDECTOMY  11/06/2008    FAMILY HISTORY: Family History  Problem Relation Age of Onset    Atrial fibrillation Mother    Diabetes Mother    Thyroid disease Mother    Cushing syndrome Mother    Tremor Mother    Atrial fibrillation Father    Glaucoma Father    Tremor Sister    Cancer Paternal Aunt        breast, colon    SOCIAL HISTORY: Social History   Socioeconomic History   Marital status: Single    Spouse name: Not on file   Number of children: 2   Years of education: 14   Highest education level: Not on file  Occupational History   Occupation: Disabled    Employer: UNEMPLOYED  Tobacco Use   Smoking status: Never   Smokeless tobacco: Never  Vaping Use   Vaping Use: Never used  Substance and Sexual Activity   Alcohol use: Not Currently    Comment: rare   Drug use: No   Sexual activity: Not Currently  Other Topics Concern   Not on file  Social History Narrative   Homeless since March 2021   Patient drinks  caffeine occasionally.   Patient is right handed.   Social Determinants of Health   Financial Resource Strain: Not on file  Food Insecurity: Not on file  Transportation Needs: Not on file  Physical Activity: Not on file  Stress: Not on file  Social Connections: Not on file  Intimate Partner Violence: Not on file    PHYSICAL EXAM  Vitals:   02/10/21 1425  BP: 116/71  Pulse: 97  Weight: 215 lb 9.6 oz (97.8 kg)  Height: 5\' 4"  (1.626 m)   Body mass index is 37.01 kg/m.  Generalized: Well developed, in no acute distress   Neurological examination  Mentation: Alert oriented to time, place, history taking. Follows all commands speech and language fluent Cranial nerve II-XII: Pupils were equal round reactive to light. Extraocular movements were full, visual field were full on confrontational test. Facial sensation and strength were normal. Uvula tongue midline. Head turning and shoulder shrug  were normal and symmetric. Motor: The motor testing reveals 5 over 5 strength of all 4 extremities. Good symmetric motor tone is noted throughout.   Sensory: Sensory testing is intact to soft touch on all 4 extremities. No evidence of extinction is noted.  Coordination: Cerebellar testing reveals good finger-nose-finger and heel-to-shin bilaterally. Slowed rapid alternating movements. Spiral test showed no organic tremors. Reflexes: Deep tendon reflexes are symmetric and normal bilaterally.   DIAGNOSTIC DATA (LABS, IMAGING, TESTING) - I reviewed patient records, labs, notes, testing and imaging myself where available.  Lab Results  Component Value Date   WBC 9.8 06/23/2020   HGB 12.3 06/23/2020   HCT 36.6 06/23/2020   MCV 85.1 06/23/2020   PLT 381 06/23/2020      Component Value Date/Time   NA 141 06/23/2020 1335   K 3.6 06/23/2020 1335   CL 101 06/23/2020 1335   CO2 29 06/23/2020 1335   GLUCOSE 115 (H) 06/23/2020 1335   BUN 9 06/23/2020 1335   CREATININE 0.81 06/23/2020 1335   CALCIUM 9.3 06/23/2020 1335   PROT 8.0 06/12/2020 1001   ALBUMIN 4.4 06/12/2020 1001   AST 15 06/12/2020 1001   ALT 15 06/12/2020 1001   ALKPHOS 76 06/12/2020 1001   BILITOT 0.7 06/12/2020 1001   GFRNONAA >60 06/23/2020 1335   GFRAA >60 02/10/2020 1513   No results found for: CHOL, HDL, LDLCALC, LDLDIRECT, TRIG, CHOLHDL No results found for: HGBA1C Lab Results  Component Value Date   VITAMINB12 420 11/23/2018   No results found for: TSH  ASSESSMENT AND PLAN 55 y.o. 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[place]), Anemia, ASD (atrial septal defect), Asthma, Bilateral dry eyes, Cervical spondylosis, CHF (congestive heart failure) ([place]), Chronic back pain, Chronic bronchitis ([place]), Chronic colitis, Chronic lower GI bleeding, Complication of anesthesia, Degenerative disk disease, Depression, Diabetes mellitus without complication ([place]), Diabetic neuropathy, type II diabetes mellitus ([place]) (12/14/2014), DJD (degenerative joint disease) of knee, Dyspnea, Endometriosis (01/18/01), Family history of adverse reaction to anesthesia,  Fatty liver, Fibromyalgia, Gastric ulcer, GERD (gastroesophageal reflux disease), Glaucoma of both eyes, H/O hiatal hernia, High cholesterol, History of colon polyps, Hypertension, Hypothyroidism, postsurgical, IBS (irritable bowel syndrome), IC (interstitial cystitis), Interstitial cystitis, Lymphedema, Migraine, OSA on CPAP, Osteoarthritis, PONV (postoperative nausea and vomiting), PTSD (post-traumatic stress disorder), PTSD (post-traumatic stress disorder), Sickle cell trait ([place]), Sjogren's syndrome ([place]), Thyroid cancer ([place]), Tremor (12/14/2014), Uterine fibroid, Varicose veins, Vestibular dizziness, and Vocal cord paralysis, unilateral complete. here with    1: Migraines  -Continue tylenol for headache -  Could not tolerate Topamax -On multiple different medications will not start anything new today -Currently followed by pain management at [facility]   2: Neck pain on left side  -MRI of cervical spine w/wo  ordered. -If approved by insurance company will give the patient Xanax to take for her scan. -If scan is relatively unremarkable patient will continue to follow-up at [place] [facility] for pain management   Ward Givens, MSN, NP-C 02/10/2021, 3:56 PM [facility] [address] [place], [place] [address] [phone]

## 2021-02-10 NOTE — Patient Instructions (Addendum)
Your Plan:  MRI cervical spine ordered  Thank you for coming to see [place] at [facility]. I hope we have been able to provide you high quality care today.  You may receive a patient satisfaction survey over the next few weeks. We would appreciate your feedback and comments so that we may continue to improve ourselves and the health of our patients.

## 2021-02-11 ENCOUNTER — Telehealth: Payer: Self-pay | Admitting: Adult Health

## 2021-02-11 ENCOUNTER — Other Ambulatory Visit (HOSPITAL_BASED_OUTPATIENT_CLINIC_OR_DEPARTMENT_OTHER): Payer: Medicaid Other

## 2021-02-11 NOTE — Telephone Encounter (Signed)
m[facility] community auth: [id number] (exp. 02/11/21 to 03/28/21) order sent to GI , they will reach out to the patient to schedule.

## 2021-02-12 ENCOUNTER — Ambulatory Visit (INDEPENDENT_AMBULATORY_CARE_PROVIDER_SITE_OTHER): Payer: Medicaid Other

## 2021-02-12 ENCOUNTER — Other Ambulatory Visit: Payer: Self-pay

## 2021-02-12 DIAGNOSIS — R0602 Shortness of breath: Secondary | ICD-10-CM | POA: Diagnosis not present

## 2021-02-12 LAB — ECHOCARDIOGRAM COMPLETE
Area-P 1/2: 4.15 cm2
S' Lateral: 2.66 cm

## 2021-03-03 ENCOUNTER — Ambulatory Visit: Payer: Medicaid Other | Admitting: Pulmonary Disease

## 2021-04-02 ENCOUNTER — Ambulatory Visit: Payer: Medicaid Other | Admitting: Pulmonary Disease

## 2021-04-09 ENCOUNTER — Emergency Department (HOSPITAL_COMMUNITY): Payer: Medicaid Other

## 2021-04-09 ENCOUNTER — Encounter (HOSPITAL_COMMUNITY): Payer: Self-pay

## 2021-04-09 ENCOUNTER — Emergency Department (HOSPITAL_COMMUNITY)
Admission: EM | Admit: 2021-04-09 | Discharge: 2021-04-09 | Disposition: A | Discharge status: Home / Self Care | Payer: Medicaid Other | Attending: Emergency Medicine | Admitting: Emergency Medicine

## 2021-04-09 DIAGNOSIS — I509 Heart failure, unspecified: Secondary | ICD-10-CM | POA: Insufficient documentation

## 2021-04-09 DIAGNOSIS — Z7984 Long term (current) use of oral hypoglycemic drugs: Secondary | ICD-10-CM | POA: Insufficient documentation

## 2021-04-09 DIAGNOSIS — M25511 Pain in right shoulder: Secondary | ICD-10-CM | POA: Diagnosis not present

## 2021-04-09 DIAGNOSIS — Z85850 Personal history of malignant neoplasm of thyroid: Secondary | ICD-10-CM | POA: Insufficient documentation

## 2021-04-09 DIAGNOSIS — E1140 Type 2 diabetes mellitus with diabetic neuropathy, unspecified: Secondary | ICD-10-CM | POA: Diagnosis not present

## 2021-04-09 DIAGNOSIS — R42 Dizziness and giddiness: Secondary | ICD-10-CM | POA: Diagnosis not present

## 2021-04-09 DIAGNOSIS — I110 Hypertensive heart disease with heart failure: Secondary | ICD-10-CM | POA: Insufficient documentation

## 2021-04-09 DIAGNOSIS — E039 Hypothyroidism, unspecified: Secondary | ICD-10-CM | POA: Diagnosis not present

## 2021-04-09 DIAGNOSIS — Z7951 Long term (current) use of inhaled steroids: Secondary | ICD-10-CM | POA: Insufficient documentation

## 2021-04-09 DIAGNOSIS — S0990XA Unspecified injury of head, initial encounter: Secondary | ICD-10-CM | POA: Diagnosis present

## 2021-04-09 DIAGNOSIS — Z20822 Contact with and (suspected) exposure to covid-19: Secondary | ICD-10-CM | POA: Insufficient documentation

## 2021-04-09 DIAGNOSIS — R55 Syncope and collapse: Secondary | ICD-10-CM | POA: Diagnosis not present

## 2021-04-09 DIAGNOSIS — Z91040 Latex allergy status: Secondary | ICD-10-CM | POA: Insufficient documentation

## 2021-04-09 DIAGNOSIS — Z79899 Other long term (current) drug therapy: Secondary | ICD-10-CM | POA: Diagnosis not present

## 2021-04-09 DIAGNOSIS — J45909 Unspecified asthma, uncomplicated: Secondary | ICD-10-CM | POA: Insufficient documentation

## 2021-04-09 DIAGNOSIS — M25561 Pain in right knee: Secondary | ICD-10-CM | POA: Insufficient documentation

## 2021-04-09 DIAGNOSIS — M542 Cervicalgia: Secondary | ICD-10-CM | POA: Diagnosis not present

## 2021-04-09 DIAGNOSIS — R110 Nausea: Secondary | ICD-10-CM | POA: Insufficient documentation

## 2021-04-09 DIAGNOSIS — W19XXXA Unspecified fall, initial encounter: Secondary | ICD-10-CM | POA: Diagnosis not present

## 2021-04-09 LAB — URINALYSIS, ROUTINE W REFLEX MICROSCOPIC
Bacteria, UA: NONE SEEN
Bilirubin Urine: NEGATIVE
Glucose, UA: NEGATIVE mg/dL
Hgb urine dipstick: NEGATIVE
Ketones, ur: NEGATIVE mg/dL
Nitrite: NEGATIVE
Protein, ur: NEGATIVE mg/dL
Specific Gravity, Urine: 1.012 (ref 1.005–1.030)
pH: 5 (ref 5.0–8.0)

## 2021-04-09 LAB — CBC WITH DIFFERENTIAL/PLATELET
Abs Immature Granulocytes: 0.05 10*3/uL (ref 0.00–0.07)
Basophils Absolute: 0.1 10*3/uL (ref 0.0–0.1)
Basophils Relative: 1 %
Eosinophils Absolute: 0.3 10*3/uL (ref 0.0–0.5)
Eosinophils Relative: 2 %
HCT: 38.3 % (ref 36.0–46.0)
Hemoglobin: 12.6 g/dL (ref 12.0–15.0)
Immature Granulocytes: 0 %
Lymphocytes Relative: 19 %
Lymphs Abs: 2.5 10*3/uL (ref 0.7–4.0)
MCH: 28.3 pg (ref 26.0–34.0)
MCHC: 32.9 g/dL (ref 30.0–36.0)
MCV: 85.9 fL (ref 80.0–100.0)
Monocytes Absolute: 1.2 10*3/uL — ABNORMAL HIGH (ref 0.1–1.0)
Monocytes Relative: 9 %
Neutro Abs: 9.4 10*3/uL — ABNORMAL HIGH (ref 1.7–7.7)
Neutrophils Relative %: 69 %
Platelets: 429 10*3/uL — ABNORMAL HIGH (ref 150–400)
RBC: 4.46 MIL/uL (ref 3.87–5.11)
RDW: 14.6 % (ref 11.5–15.5)
WBC: 13.5 10*3/uL — ABNORMAL HIGH (ref 4.0–10.5)
nRBC: 0 % (ref 0.0–0.2)

## 2021-04-09 LAB — BRAIN NATRIURETIC PEPTIDE: B Natriuretic Peptide: 13.9 pg/mL (ref 0.0–100.0)

## 2021-04-09 LAB — RESP PANEL BY RT-PCR (FLU A&B, COVID) ARPGX2
Influenza A by PCR: NEGATIVE
Influenza B by PCR: NEGATIVE
SARS Coronavirus 2 by RT PCR: NEGATIVE

## 2021-04-09 LAB — HEPATIC FUNCTION PANEL
ALT: 13 U/L (ref 0–44)
AST: 25 U/L (ref 15–41)
Albumin: 4.5 g/dL (ref 3.5–5.0)
Alkaline Phosphatase: 88 U/L (ref 38–126)
Bilirubin, Direct: 0.3 mg/dL — ABNORMAL HIGH (ref 0.0–0.2)
Indirect Bilirubin: 0.6 mg/dL (ref 0.3–0.9)
Total Bilirubin: 0.9 mg/dL (ref 0.3–1.2)
Total Protein: 8.6 g/dL — ABNORMAL HIGH (ref 6.5–8.1)

## 2021-04-09 LAB — T4, FREE: Free T4: 0.56 ng/dL — ABNORMAL LOW (ref 0.61–1.12)

## 2021-04-09 LAB — BASIC METABOLIC PANEL
Anion gap: 9 (ref 5–15)
BUN: 13 mg/dL (ref 6–20)
CO2: 32 mmol/L (ref 22–32)
Calcium: 9.4 mg/dL (ref 8.9–10.3)
Chloride: 99 mmol/L (ref 98–111)
Creatinine, Ser: 0.86 mg/dL (ref 0.44–1.00)
GFR, Estimated: >60 mL/min (ref >60)
Glucose, Bld: 108 mg/dL — ABNORMAL HIGH (ref 70–99)
Potassium: 4.3 mmol/L (ref 3.5–5.1)
Sodium: 140 mmol/L (ref 135–145)

## 2021-04-09 LAB — TROPONIN I (HIGH SENSITIVITY): Troponin I (High Sensitivity): 4 ng/L (ref <18)

## 2021-04-09 LAB — MAGNESIUM: Magnesium: 2.2 mg/dL (ref 1.7–2.4)

## 2021-04-09 LAB — TSH: TSH: 1.159 u[IU]/mL (ref 0.350–4.500)

## 2021-04-09 MED ORDER — ALBUTEROL SULFATE HFA 108 (90 BASE) MCG/ACT IN AERS
4.0000 | INHALATION_SPRAY | Freq: Once | RESPIRATORY_TRACT | Status: AC
  Administered 2021-04-09: 4 via RESPIRATORY_TRACT
  Filled 2021-04-09: qty 6.7

## 2021-04-09 MED ORDER — ASPIRIN 81 MG PO CHEW
324.0000 mg | CHEWABLE_TABLET | Freq: Once | ORAL | Status: AC
  Administered 2021-04-09: 324 mg via ORAL
  Filled 2021-04-09: qty 4

## 2021-04-09 NOTE — ED Triage Notes (Signed)
Pt arrived via POV, states she had an episode of nausea and dizziness earlier this morning that then caused her to fall and hit head/neck on door frame. Unsure if she lost consciousness or not. Able to recall events but states she is not sure if she fully went out as she fell. C/o right sided knee and arm pain.

## 2021-04-09 NOTE — ED Notes (Signed)
Patient transported to CT 

## 2021-04-09 NOTE — ED Provider Notes (Signed)
[facility] DEPT Provider Note   CSN: [id number] Arrival date & time: 04/09/21  1152     History Chief Complaint  Patient presents with   Fall   Head Injury    Alexis [name] is a 55 y.o. female.   Fall Associated symptoms include headaches. Pertinent negatives include no chest pain and no abdominal pain.  Head Injury Associated symptoms: headache, nausea and neck pain   Associated symptoms: no numbness, no seizures and no vomiting   Patient presents for syncope and fall.  Fall occurred this morning, while in the bathroom.  She was up around 4 AM.  She states that she typically does not sleep well.  She was on her phone and went into the bathroom to get better lighting.  She had a prodrome of nausea and tunnel vision prior to syncopized and.  Following the fall, she woke up on the floor and had pain on the right side of her head and neck.  She has also had pain in her right shoulder and right knee.  She denies history of prior syncopal episodes.  She has continued pain in her right knee that is worse with weightbearing.  Pain in her shoulder is minimal.  She currently denies any areas of weakness, numbness, or paresthesias.  She denies any current nausea.  She denies any current or previous chest discomfort.  She does state that she has shortness of breath and tachycardia at baseline.  She has been following with her primary care doctor for these chronic symptoms.  Underlying etiology has not been identified.     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[place])    "left"/CT (05/17/2017)   Anemia    ASD (atrial septal defect)    per pt   Asthma    Bilateral dry eyes    Cervical spondylosis    C5-6   CHF (congestive heart failure) (HCC)    not sure   Chronic back pain    Chronic bronchitis (HCC)    Chronic colitis    Chronic lower GI bleeding    Complication of anesthesia    has awakened in past during surgery/  LEFT VOCAL  CORD PARALYSIS  POST TOTAL THYROIDECTOMY  03-08-2009 ([facility])   Degenerative disk disease    "cervical to sacrum" (05/17/2017)   Depression    Diabetes mellitus without complication ([place])    Diabetic neuropathy, type II diabetes mellitus ([place]) 12/14/2014   not on medication   DJD (degenerative joint disease) of knee    Dyspnea    Endometriosis 01/18/01   Family history of adverse reaction to anesthesia    "mom wakes up during procedures"   Fatty liver    Fibromyalgia    Gastric ulcer    GERD (gastroesophageal reflux disease)    Glaucoma of both eyes    H/O hiatal hernia    High cholesterol    History of colon polyps    Hypertension    Hypothyroidism, postsurgical    IBS (irritable bowel syndrome)    IC (interstitial cystitis)    Interstitial cystitis    Lymphedema    Migraine    "monthly" (05/17/2017)  02/06/2019- more frequently   OSA on CPAP    Osteoarthritis    "all over; mainly knees and back" (05/17/2017)   PONV (postoperative nausea and vomiting)    PTSD (post-traumatic stress disorder)    PTSD (post-traumatic stress disorder)    Sickle cell trait (  [place])    Sjogren's syndrome ([place])    Thyroid cancer ([place])    Tremor 12/14/2014   hand   Uterine fibroid    Varicose veins    Vestibular dizziness    Vocal cord paralysis, unilateral complete    "left";  POST TOTAL THYROIDECTOMY    Patient Active Problem List   Diagnosis Date Noted   Dizziness 10/11/2020   Colitis with rectal bleeding 05/16/2017   Chronic pain    Nausea and vomiting    Fibromyalgia 02/21/2016   Tremor 12/14/2014   Diabetic neuropathy, type II diabetes mellitus ([place]) 12/14/2014   Varicose veins of lower extremities with other complications 54/65/6812   Leg swelling 07/25/2012   SOB (shortness of breath) 02/22/2012   Chronic pelvic pain in female 06/26/2000    Past Surgical History:  Procedure Laterality Date   ACHILLES TENDON REPAIR Left 2007   BUNIONECTOMY WITH HAMMERTOE RECONSTRUCTION  AND GASTROC SLIDE Left FEB  2011   REMOVAL HARDWARE  NOV  2011   CARDIAC CATHETERIZATION N/A 10/28/2015   Procedure: Right Heart Cath;  Surgeon: Adrian Prows, MD;  Location: [facility];  Service: Cardiovascular;  Laterality: N/A;   COLONOSCOPY WITH ESOPHAGOGASTRODUODENOSCOPY (EGD)  MULTIPLE --  LAST ONE   NOV  2014   CYSTO/  BILATERAL RETROGRADE PYELOGRAM/  HYDRODISTENTION/  INSTILLATION THERAPY  05-08-2003   CYSTO/  HYDRODISTENTION/  INSTILLATION THERAPY  06-04-2005  &  03-04-2007   CYSTO/  URETHRAL DILATION /  BILATERAL UNROOFING URETEROCELE/  BILATERAL URETERAL STENT PLACEMENT  12-01-2002   D & C HYSTEROSCOPY/  REMOVAL IUD  12-06-2003   DE QUERVAIN'S RELEASE Left ?09/2005   DIAGNOSTIC LAPAROSCOPY     DILATATION AND CURETTAGE WITH SUCTION  05-29-2005   RETAINED PLACENTA   DILATION AND CURETTAGE OF UTERUS     DX LAPAROSCOPY/  PELVIC BX'S/  LYSIS ADHESIONS  06-26-2000   FOOT HARDWARE REMOVAL Left 03/2010   REMOVAL HARDWARE "related to earlier hammertoe OR"   HYDRADENITIS EXCISION  06/29/2011   Procedure: EXCISION HYDRADENITIS AXILLA;  Surgeon: Hermelinda Dellen, MD;  Location: [facility];  Service: Plastics;;  right breast hydradenitis excioion   HYDRADENITIS EXCISION Left 2000   INCISION AND DRAINAGE ABSCESS ANAL  02-14-2001   KNEE ARTHROSCOPY WITH LATERAL MENISECTOMY Right 09/08/2013   Procedure: RIGHT KNEE ARTHROSCOPY PARTIAL LATERAL MENISCECTOMY AND DEBRIDEMENT ;  Surgeon: Johnn Hai, MD;  Location: [facility];  Service: Orthopedics;  Laterality: Right;   LAPAROSCOPY ABDOMEN DIAGNOSTIC  SEPT  2003   [place]   AND PLACEMENT IUD   MASS EXCISION  10/29/2003   WIDE EXCISION CHEST AREA   PILONIDAL CYST EXCISION  1983   RADIOLOGY WITH ANESTHESIA N/A 02/07/2019   Procedure: MRI BRAIN WITHOUT IV CONTRAST; MRI CERVICAL, THORACIC, AND LUMBAR SPINE WITH AND WITHOUT CONTRAST;  Surgeon: Radiologist, Medication, MD;  Location: [facility];  Service: Radiology;   Laterality: N/A;   RADIOLOGY WITH ANESTHESIA N/A 02/15/2020   Procedure: MRI LUMBAR WITHOUT CONTRAST;  Surgeon: Radiologist, Medication, MD;  Location: [facility];  Service: Radiology;  Laterality: N/A;   REDUCTION MAMMAPLASTY  1997   TEE WITHOUT CARDIOVERSION N/A 09/26/2015   Procedure: TRANSESOPHAGEAL ECHOCARDIOGRAM (TEE);  Surgeon: Adrian Prows, MD;  Location: [facility];  Service: Cardiovascular;  Laterality: N/A;   TEMPORAL ARTERY BIOPSY / LIGATION Left 2010   TEMPORAL ARTERY BIOPSY / LIGATION Right 06-16-2002   TONSILLECTOMY  1990   TOTAL THYROIDECTOMY  11/06/2008     OB  History     Gravida  1   Para  1   Term      Preterm      AB      Living  1      SAB      IAB      Ectopic      Multiple      Live Births  1           Family History  Problem Relation Age of Onset   Atrial fibrillation Mother    Diabetes Mother    Thyroid disease Mother    Cushing syndrome Mother    Tremor Mother    Atrial fibrillation Father    Glaucoma Father    Tremor Sister    Cancer Paternal Aunt        breast, colon    Social History   Tobacco Use   Smoking status: Never   Smokeless tobacco: Never  Vaping Use   Vaping Use: Never used  Substance Use Topics   Alcohol use: Not Currently    Comment: rare   Drug use: No    Home Medications Prior to Admission medications   Medication Sig Start Date End Date Taking? Authorizing Provider  acetaminophen (TYLENOL) 500 MG tablet Take 1,000 mg by mouth every 6 (six) hours as needed for headache.    [provider]  albuterol (PROVENTIL HFA;VENTOLIN HFA) 108 (90 BASE) MCG/ACT inhaler Inhale 2 puffs into the lungs every 4 (four) hours as needed for wheezing or shortness of breath.     [provider]  atorvastatin (LIPITOR) 20 MG tablet Take 20 mg by mouth daily.    [provider]  buprenorphine (BUTRANS) 10 MCG/HR [place] 1 patch onto the skin once a week.    [provider]  cetirizine  (ZYRTEC) 10 MG chewable tablet Chew 10 mg by mouth daily.    [provider]  Diclofenac Sodium 3 % GEL Apply topically. 10/10/20   [provider]  dicyclomine (BENTYL) 20 MG tablet Take 1 tablet (20 mg total) by mouth 2 (two) times daily. 03/20/20   Marcello Fennel, PA-C  DULERA 200-5 MCG/ACT AERO Inhale 2 puffs into the lungs 2 (two) times daily. 05/03/17   [provider]  DULoxetine (CYMBALTA) 30 MG capsule Take 60 mg by mouth at bedtime. 01/24/20   [provider]  hydrOXYzine (ATARAX/VISTARIL) 50 MG tablet Take 50 mg by mouth 4 (four) times daily. 09/15/16   [provider]  Lidocaine 0.5 % GEL Apply 1 application topically 3 (three) times daily as needed (pain).     [provider]  LYRICA 75 MG capsule Take 75 mg by mouth 2 (two) times daily.  07/24/16   [provider]  Melatonin 10 MG CAPS Take 10 mg by mouth at bedtime.     [provider]  metFORMIN (GLUCOPHAGE) 500 MG tablet Take 500 mg by mouth 2 (two) times daily with a meal.    [provider]  omeprazole (PRILOSEC) 40 MG capsule Take 40 mg by mouth daily.    [provider]  ondansetron (ZOFRAN-ODT) 8 MG disintegrating tablet Take 8 mg by mouth every 8 (eight) hours as needed for nausea or vomiting.    [provider]  OVER THE COUNTER MEDICATION Apply 1 application topically 3 (three) times daily. CBD cream    [provider]  oxybutynin (DITROPAN XL) 15 MG 24 hr tablet Take 15 mg by mouth  daily. 10/07/20   [provider]  oxycodone (ROXICODONE) 30 MG immediate release tablet Take 30 mg by mouth every 4 (four) hours as needed for pain.    [provider]  predniSONE (DELTASONE) 20 MG tablet 3 Tabs PO Days 1-3, then 2 tabs PO Days 4-6, then 1 tab PO Day 7-9, then Half Tab PO Day 10-12 06/23/20   Carlisle Cater, PA-C  promethazine (PHENERGAN) 12.5 MG suppository Place 1 suppository (12.5 mg total) rectally every 6  (six) hours as needed for up to 12 days for nausea or vomiting. 03/20/20 04/01/20  Marcello Fennel, PA-C  promethazine (PHENERGAN) 25 MG tablet Take 25 mg by mouth every 6 (six) hours as needed for nausea or vomiting.    [provider]  SYMBICORT 160-4.5 MCG/ACT inhaler Inhale into the lungs. 11/06/20   [provider]  thyroid (ARMOUR) 120 MG tablet Take 120 mg by mouth daily before breakfast.    [provider]  tiZANidine (ZANAFLEX) 4 MG tablet Take 4 mg by mouth 3 (three) times daily. 10/10/20   [provider]  topiramate (TOPAMAX) 25 MG tablet Take one tablet at night for one week, then take 2 tablets at night for one week, then take 3 tablets at night. 10/11/20   Kathrynn Ducking, MD  triamterene-hydrochlorothiazide (MAXZIDE-25) 37.5-25 MG tablet Take 1 tablet by mouth daily. 03/01/20   [provider]  verapamil (CALAN-SR) 120 MG CR tablet Take 120 mg by mouth daily. 09/12/20   [provider]  Vitamin D, Ergocalciferol, (DRISDOL) 1.25 MG (50000 UNIT) CAPS capsule Take 50,000 Units by mouth once a week. Tuesday 01/24/20   [provider]  lisinopril (ZESTRIL) 20 MG tablet Take 1 tablet (20 mg total) by mouth daily. 02/10/20 02/10/20  Milton Ferguson, MD    Allergies    Food, Other, Adhesive [tape], Bactrim, Clarithromycin, Ibuprofen, Nexium [esomeprazole], Nsaids, Peg 3350-electrolytes, Sulfa antibiotics, Tisagenlecleucel, Tolmetin, Trimethoprim, and Latex  Review of Systems   Review of Systems  Constitutional:  Positive for fatigue. Negative for activity change, appetite change, chills and fever.  HENT:  Negative for ear pain, rhinorrhea and sore throat.   Eyes:  Negative for pain and visual disturbance.  Respiratory:  Negative for cough and chest tightness.   Cardiovascular:  Negative for chest pain, palpitations and leg swelling.  Gastrointestinal:  Positive for nausea. Negative for abdominal distention, abdominal pain and  vomiting.  Genitourinary:  Negative for dysuria, flank pain and hematuria.  Musculoskeletal:  Positive for arthralgias, joint swelling and neck pain. Negative for back pain, myalgias and neck stiffness.  Skin:  Negative for color change, rash and wound.  Neurological:  Positive for dizziness, syncope, light-headedness and headaches. Negative for tremors, seizures, speech difficulty, weakness and numbness.  Hematological:  Does not bruise/bleed easily.  Psychiatric/Behavioral:  Negative for confusion and decreased concentration.   All other systems reviewed and are negative.  Physical Exam Updated Vital Signs BP 137/90   Pulse (!) 116   Temp 97.7 F (36.5 C) (Oral)   Resp (!) 23   Ht 5\' 4"  (1.626 m)   Wt 98 kg   LMP 02/04/2018   SpO2 98%   BMI 37.08 kg/m   Physical Exam Vitals and nursing note reviewed.  Constitutional:      General: She is not in acute distress.    Appearance: Normal appearance. She is well-developed. She is not ill-appearing, toxic-appearing or diaphoretic.  HENT:     Head: Normocephalic and atraumatic.  Right Ear: External ear normal.     Left Ear: External ear normal.     Nose: Nose normal.     Mouth/Throat:     Mouth: Mucous membranes are moist.     Pharynx: Oropharynx is clear.  Eyes:     General: No scleral icterus.    Extraocular Movements: Extraocular movements intact.     Conjunctiva/sclera: Conjunctivae normal.  Cardiovascular:     Rate and Rhythm: Normal rate and regular rhythm.     Heart sounds: No murmur heard. Pulmonary:     Effort: Pulmonary effort is normal. No respiratory distress.     Breath sounds: Wheezing (Faint, end expiratory) present. No rales.  Abdominal:     Palpations: Abdomen is soft.     Tenderness: There is no abdominal tenderness.  Musculoskeletal:        General: Swelling, tenderness (Anterior right knee) and signs of injury present. No deformity.     Cervical back: Normal range of motion and neck supple.  Tenderness (Right-sided muscles) present. No rigidity.     Right lower leg: No edema.     Left lower leg: No edema.  Skin:    General: Skin is warm and dry.     Capillary Refill: Capillary refill takes less than 2 seconds.     Coloration: Skin is not jaundiced or pale.  Neurological:     General: No focal deficit present.     Mental Status: She is alert and oriented to person, place, and time.     Cranial Nerves: No cranial nerve deficit.     Sensory: No sensory deficit.     Motor: No weakness.     Coordination: Coordination normal.  Psychiatric:        Mood and Affect: Mood normal.        Behavior: Behavior normal.        Thought Content: Thought content normal.        Judgment: Judgment normal.    ED Results / Procedures / Treatments   Labs (all labs ordered are listed, but only abnormal results are displayed) Labs Reviewed  BASIC METABOLIC PANEL - Abnormal; Notable for the following components:      Result Value   Glucose, Bld 108 (*)    All other components within normal limits  CBC WITH DIFFERENTIAL/PLATELET - Abnormal; Notable for the following components:   WBC 13.5 (*)    Platelets 429 (*)    Neutro Abs 9.4 (*)    Monocytes Absolute 1.2 (*)    All other components within normal limits  URINALYSIS, ROUTINE W REFLEX MICROSCOPIC - Abnormal; Notable for the following components:   Leukocytes,Ua MODERATE (*)    All other components within normal limits  HEPATIC FUNCTION PANEL - Abnormal; Notable for the following components:   Total Protein 8.6 (*)    Bilirubin, Direct 0.3 (*)    All other components within normal limits  T4, FREE - Abnormal; Notable for the following components:   Free T4 0.56 (*)    All other components within normal limits  T3, FREE - Abnormal; Notable for the following components:   T3, Free 5.0 (*)    All other components within normal limits  RESP PANEL BY RT-PCR (FLU A&B, COVID) ARPGX2  MAGNESIUM  BRAIN NATRIURETIC PEPTIDE  TSH  TROPONIN  I (HIGH SENSITIVITY)    EKG EKG Interpretation  Date/Time:  Wednesday April 09 2021 12:09:28 EST Ventricular Rate:  114 PR Interval:  114 QRS Duration: 82 QT  Interval:  260 QTC Calculation: 358 R Axis:   37 Text Interpretation: Sinus tachycardia Abnormal R-wave progression, early transition Nonspecific T abnormalities, diffuse leads Confirmed by Godfrey Pick [id number]) on 04/09/2021 1:33:34 PM  Radiology DG Chest 2 View  Result Date: 04/09/2021 CLINICAL DATA:  Pt c/o right shoulder and right knee pain post fall. No cp/sob. States the shoulder pain is anterior and the knee pain is medial and is up and down her right leg. Hx of CHF, diabetes, HTN, osteo arthritis EXAM: CHEST - 2 VIEW COMPARISON:  10/20/2016 FINDINGS: Lungs are clear. Heart size and mediastinal contours are within normal limits. No effusion. Visualized bones unremarkable. IMPRESSION: No acute cardiopulmonary disease. Electronically Signed   By: Lucrezia Europe M.D.   On: 04/09/2021 13:23   DG Shoulder Right  Result Date: 04/09/2021 CLINICAL DATA:  Fall, right shoulder pain EXAM: RIGHT SHOULDER - 2+ VIEW COMPARISON:  None. FINDINGS: Normal alignment. No acute osseous finding, fracture, subluxation or dislocation. Mild degenerative arthropathy of the [facility] joint and glenohumeral joint. Calcifications along the rotator cuff insertion. Included right chest unremarkable. IMPRESSION: Degenerative changes as above. No acute finding or malalignment by plain radiography Electronically Signed   By: Jerilynn Mages.  Shick M.D.   On: 04/09/2021 13:23   CT Head Wo Contrast  Result Date: 04/09/2021 CLINICAL DATA:  Head trauma, mod-severe; Neck trauma, midline tenderness (Age 7-64y) EXAM: CT HEAD WITHOUT CONTRAST CT CERVICAL SPINE WITHOUT CONTRAST TECHNIQUE: Multidetector CT imaging of the head and cervical spine was performed following the standard protocol without intravenous contrast. Multiplanar CT image reconstructions of the cervical spine were also  generated. COMPARISON:  None. FINDINGS: CT HEAD FINDINGS Brain: No evidence of acute infarction, hemorrhage, hydrocephalus, extra-axial collection or mass lesion/mass effect. Partially empty sella. Vascular: No hyperdense vessel identified. Skull: No acute fracture.  Hyperostosis frontalis. Sinuses/Orbits: Clear sinuses.  Unremarkable orbits. Other: No mastoid effusions. CT CERVICAL SPINE FINDINGS Alignment: Mild reversal the normal cervical lordosis. No substantial sagittal subluxation. Skull base and vertebrae: Vertebral body heights are maintained. No evidence of acute fracture. Soft tissues and spinal canal: No prevertebral fluid or swelling. No visible canal hematoma. Disc levels: Multilevel degenerative disc disease, greatest and mild-to-moderate at C5-C6. Multilevel facet arthropathy, worst and severe on the left at C7-T1. Upper chest: Visualized lung apices are clear. Other: Sequela of thyroidectomy. IMPRESSION: CT head: 1. No evidence of acute intracranial abnormality. 2. Partially empty sella, which is often a normal anatomic variant but can be associated with idiopathic intracranial hypertension. CT cervical spine: 1. No evidence of acute fracture or traumatic malalignment. 2. Degenerative changes, detailed above. Electronically Signed   By: Margaretha Sheffield M.D.   On: 04/09/2021 13:12   CT Cervical Spine Wo Contrast  Result Date: 04/09/2021 CLINICAL DATA:  Head trauma, mod-severe; Neck trauma, midline tenderness (Age 7-64y) EXAM: CT HEAD WITHOUT CONTRAST CT CERVICAL SPINE WITHOUT CONTRAST TECHNIQUE: Multidetector CT imaging of the head and cervical spine was performed following the standard protocol without intravenous contrast. Multiplanar CT image reconstructions of the cervical spine were also generated. COMPARISON:  None. FINDINGS: CT HEAD FINDINGS Brain: No evidence of acute infarction, hemorrhage, hydrocephalus, extra-axial collection or mass lesion/mass effect. Partially empty sella.  Vascular: No hyperdense vessel identified. Skull: No acute fracture.  Hyperostosis frontalis. Sinuses/Orbits: Clear sinuses.  Unremarkable orbits. Other: No mastoid effusions. CT CERVICAL SPINE FINDINGS Alignment: Mild reversal the normal cervical lordosis. No substantial sagittal subluxation. Skull base and vertebrae: Vertebral body heights are maintained. No evidence of acute fracture. Soft tissues  and spinal canal: No prevertebral fluid or swelling. No visible canal hematoma. Disc levels: Multilevel degenerative disc disease, greatest and mild-to-moderate at C5-C6. Multilevel facet arthropathy, worst and severe on the left at C7-T1. Upper chest: Visualized lung apices are clear. Other: Sequela of thyroidectomy. IMPRESSION: CT head: 1. No evidence of acute intracranial abnormality. 2. Partially empty sella, which is often a normal anatomic variant but can be associated with idiopathic intracranial hypertension. CT cervical spine: 1. No evidence of acute fracture or traumatic malalignment. 2. Degenerative changes, detailed above. Electronically Signed   By: Margaretha Sheffield M.D.   On: 04/09/2021 13:12   DG Knee Complete 4 Views Right  Result Date: 04/09/2021 CLINICAL DATA:  RIGHT shoulder and knee pain EXAM: RIGHT KNEE - COMPLETE 4+ VIEW COMPARISON:  None. FINDINGS: No fracture of the proximal tibia or distal femur. Patella is normal. No joint effusion. Mild spurring of the medial and lateral compartment. Mild patellar spurring IMPRESSION: 1. No acute findings in the. 2. Mild osteophytosis. Electronically Signed   By: [name] M.D.   On: 04/09/2021 13:23    Procedures Procedures   Medications Ordered in ED Medications  aspirin chewable tablet 324 mg (324 mg Oral Given 04/09/21 1340)  albuterol (VENTOLIN HFA) 108 (90 Base) MCG/ACT inhaler 4 puff (4 puffs Inhalation Given 04/09/21 1519)    ED Course  I have reviewed the triage vital signs and the nursing notes.  Pertinent labs & imaging  results that were available during my care of the patient were reviewed by me and considered in my medical decision making (see chart for details).    MDM Rules/Calculators/A&P                          Patient presents after syncope and fall.  Fall occurred at approximately 4 AM.  She had a prodrome of dizziness, lightheadedness, tunnel vision, and nausea.  She denies any chest discomfort at that time.  History is consistent with vasovagal etiology.  In addition to her syncopal episode, there is concern of possible injury from the fall.  When patient awoke, she had pain in the right side of her head and neck, as well as her right shoulder and knee.  On arrival in the ED, vital signs are notable for tachycardia, which she states is a chronic issue for her.  EKG shows sinus rhythm.  Patient to undergo lab and imaging studies.  X-ray imaging showed no acute osseous or joint abnormalities.  Given the patient's right knee pain, knee brace and crutches were applied.  She was advised to follow-up with outpatient orthopedic surgery for further evaluation, as needed, for persistent discomfort.  CT scans of head and cervical spine were negative.  Patient was given albuterol inhaler for shortness of breath and faint end expiratory wheeze.  Patient to follow-up with her primary care doctor for management of ongoing chronic issues.  She is stable for discharge at this time.  Final Clinical Impression(s) / ED Diagnoses Final diagnoses:  Fall, initial encounter  Syncope and collapse    Rx / DC Orders ED Discharge Orders     None        Godfrey Pick, MD 04/10/21 [phone]

## 2021-04-09 NOTE — ED Provider Notes (Signed)
Emergency Medicine Provider Triage Evaluation Note  Alexis [name] , a 55 y.o. female  was evaluated in triage.  Pt complains of syncope and head injury.  Patient reports she was standing in her bathroom when she had a sudden onset of nausea and had tunnel vision and then woke up in the floor of her bathroom.  Has pain on the right side of her head and neck and assume she hit them on the bathroom door frame because her head was through the doorway when she woke up.  Also complaining of some pain in her right shoulder and right knee.  Not on blood thinners.  Reports that she has been following with a cardiologist for persistent tachycardia and they have been trying different medications for this.  With this she is also experienced some shortness of breath.  She has not had prior syncopal episodes.  Review of Systems  Positive: Syncope, head injury, joint pain, shortness of breath Negative: Chest pain, vomiting, abdominal pain  Physical Exam  BP (!) 144/101 (BP Location: Left Arm)   Pulse (!) 134   Temp 97.9 F (36.6 C) (Oral)   Resp 18   LMP 02/04/2018   SpO2 94%  Gen:   Awake, no distress   Resp:  Normal effort  MSK:   Moves extremities without difficulty  Other:    Medical Decision Making  Medically screening exam initiated at 12:16 PM.  Appropriate orders placed.  [name] was informed that the remainder of the evaluation will be completed by another provider, this initial triage assessment does not replace that evaluation, and the importance of remaining in the ED until their evaluation is complete.     Jacqlyn Larsen, PA-C 04/09/21 Tullos, Ankit, MD 04/10/21 [phone]

## 2021-04-10 LAB — T3, FREE: T3, Free: 5 pg/mL — ABNORMAL HIGH (ref 2.0–4.4)

## 2021-05-08 ENCOUNTER — Other Ambulatory Visit: Payer: Self-pay

## 2021-05-08 ENCOUNTER — Emergency Department (HOSPITAL_COMMUNITY)
Admission: EM | Admit: 2021-05-08 | Discharge: 2021-05-08 | Disposition: A | Discharge status: Home / Self Care | Payer: Medicaid Other | Attending: Emergency Medicine | Admitting: Emergency Medicine

## 2021-05-08 ENCOUNTER — Emergency Department (HOSPITAL_COMMUNITY): Payer: Medicaid Other

## 2021-05-08 ENCOUNTER — Encounter (HOSPITAL_COMMUNITY): Payer: Self-pay

## 2021-05-08 DIAGNOSIS — J45909 Unspecified asthma, uncomplicated: Secondary | ICD-10-CM | POA: Insufficient documentation

## 2021-05-08 DIAGNOSIS — R42 Dizziness and giddiness: Secondary | ICD-10-CM | POA: Insufficient documentation

## 2021-05-08 DIAGNOSIS — E039 Hypothyroidism, unspecified: Secondary | ICD-10-CM | POA: Insufficient documentation

## 2021-05-08 DIAGNOSIS — Z7984 Long term (current) use of oral hypoglycemic drugs: Secondary | ICD-10-CM | POA: Diagnosis not present

## 2021-05-08 DIAGNOSIS — I110 Hypertensive heart disease with heart failure: Secondary | ICD-10-CM | POA: Diagnosis not present

## 2021-05-08 DIAGNOSIS — I509 Heart failure, unspecified: Secondary | ICD-10-CM | POA: Insufficient documentation

## 2021-05-08 DIAGNOSIS — N3001 Acute cystitis with hematuria: Secondary | ICD-10-CM | POA: Diagnosis not present

## 2021-05-08 DIAGNOSIS — Z91040 Latex allergy status: Secondary | ICD-10-CM | POA: Diagnosis not present

## 2021-05-08 DIAGNOSIS — H8190 Unspecified disorder of vestibular function, unspecified ear: Secondary | ICD-10-CM

## 2021-05-08 DIAGNOSIS — E1142 Type 2 diabetes mellitus with diabetic polyneuropathy: Secondary | ICD-10-CM | POA: Diagnosis not present

## 2021-05-08 DIAGNOSIS — Z79899 Other long term (current) drug therapy: Secondary | ICD-10-CM | POA: Diagnosis not present

## 2021-05-08 DIAGNOSIS — Z85850 Personal history of malignant neoplasm of thyroid: Secondary | ICD-10-CM | POA: Diagnosis not present

## 2021-05-08 DIAGNOSIS — I10 Essential (primary) hypertension: Secondary | ICD-10-CM | POA: Diagnosis present

## 2021-05-08 LAB — COMPREHENSIVE METABOLIC PANEL
ALT: 16 U/L (ref 0–44)
AST: 18 U/L (ref 15–41)
Albumin: 4.4 g/dL (ref 3.5–5.0)
Alkaline Phosphatase: 91 U/L (ref 38–126)
Anion gap: 8 (ref 5–15)
BUN: 13 mg/dL (ref 6–20)
CO2: 30 mmol/L (ref 22–32)
Calcium: 9.2 mg/dL (ref 8.9–10.3)
Chloride: 101 mmol/L (ref 98–111)
Creatinine, Ser: 0.88 mg/dL (ref 0.44–1.00)
GFR, Estimated: >60 mL/min (ref >60)
Glucose, Bld: 81 mg/dL (ref 70–99)
Potassium: 3.8 mmol/L (ref 3.5–5.1)
Sodium: 139 mmol/L (ref 135–145)
Total Bilirubin: 0.8 mg/dL (ref 0.3–1.2)
Total Protein: 7.9 g/dL (ref 6.5–8.1)

## 2021-05-08 LAB — URINALYSIS, ROUTINE W REFLEX MICROSCOPIC
Bacteria, UA: NONE SEEN
Bilirubin Urine: NEGATIVE
Glucose, UA: NEGATIVE mg/dL
Ketones, ur: NEGATIVE mg/dL
Nitrite: NEGATIVE
Protein, ur: NEGATIVE mg/dL
Specific Gravity, Urine: 1.02 (ref 1.005–1.030)
pH: 5.5 (ref 5.0–8.0)

## 2021-05-08 LAB — CBC WITH DIFFERENTIAL/PLATELET
Abs Immature Granulocytes: 0.04 10*3/uL (ref 0.00–0.07)
Basophils Absolute: 0.1 10*3/uL (ref 0.0–0.1)
Basophils Relative: 1 %
Eosinophils Absolute: 0.3 10*3/uL (ref 0.0–0.5)
Eosinophils Relative: 2 %
HCT: 37.1 % (ref 36.0–46.0)
Hemoglobin: 12.2 g/dL (ref 12.0–15.0)
Immature Granulocytes: 0 %
Lymphocytes Relative: 23 %
Lymphs Abs: 3.8 10*3/uL (ref 0.7–4.0)
MCH: 28.2 pg (ref 26.0–34.0)
MCHC: 32.9 g/dL (ref 30.0–36.0)
MCV: 85.7 fL (ref 80.0–100.0)
Monocytes Absolute: 1.4 10*3/uL — ABNORMAL HIGH (ref 0.1–1.0)
Monocytes Relative: 8 %
Neutro Abs: 11.2 10*3/uL — ABNORMAL HIGH (ref 1.7–7.7)
Neutrophils Relative %: 66 %
Platelets: 381 10*3/uL (ref 150–400)
RBC: 4.33 MIL/uL (ref 3.87–5.11)
RDW: 14.6 % (ref 11.5–15.5)
WBC: 16.8 10*3/uL — ABNORMAL HIGH (ref 4.0–10.5)
nRBC: 0 % (ref 0.0–0.2)

## 2021-05-08 LAB — TROPONIN I (HIGH SENSITIVITY)
Troponin I (High Sensitivity): 6 ng/L (ref <18)
Troponin I (High Sensitivity): 6 ng/L (ref <18)

## 2021-05-08 MED ORDER — MECLIZINE HCL 25 MG PO TABS: 25.0000 mg | ORAL_TABLET | Freq: Three times a day (TID) | ORAL | 0 refills | Status: DC | PRN

## 2021-05-08 MED ORDER — LEVOFLOXACIN 500 MG PO TABS: 500.0000 mg | ORAL_TABLET | Freq: Every day | ORAL | 0 refills | Status: DC

## 2021-05-08 NOTE — ED Provider Notes (Signed)
[facility] DEPT Provider Note   CSN: [id number] Arrival date & time: 05/08/21  7425     History Chief Complaint  Patient presents with   Hypertension    Alexis [name] is a 55 y.o. female with history of asthma, CHF, diabetes, endometriosis, fibromyalgia, GERD, hypertension, hypothyroidism, IBS, and vestibular dizziness who presents emergency department complaining of with numerous complaints.  Patient states that she has had episodic vertigo, and fell about 4 weeks ago hitting her right neck and right shoulder on the wall.  Since then she continues to have intermittent episodes of vertigo, and almost fell earlier today.  She is also complaining of some intermittent chest pain, with no aggravating or alleviating factors.  Reports that she has had several episodes of sharp abdominal pain, nausea without vomiting, decreased urination and foul-smelling urine.  She states she has been taking her 2 antihypertensive medications as prescribed, but continues to have elevated blood pressures at home.   Hypertension Associated symptoms include chest pain and abdominal pain. Pertinent negatives include no headaches.      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[place])    "left"/CT (05/17/2017)   Anemia    ASD (atrial septal defect)    per pt   Asthma    Bilateral dry eyes    Cervical spondylosis    C5-6   CHF (congestive heart failure) (HCC)    not sure   Chronic back pain    Chronic bronchitis (HCC)    Chronic colitis    Chronic lower GI bleeding    Complication of anesthesia    has awakened in past during surgery/  LEFT VOCAL CORD PARALYSIS  POST TOTAL THYROIDECTOMY  03-08-2009 ([facility])   Degenerative disk disease    "cervical to sacrum" (05/17/2017)   Depression    Diabetes mellitus without complication (HCC)    Diabetic neuropathy, type II diabetes mellitus ([place]) 12/14/2014   not on medication   DJD (degenerative joint  disease) of knee    Dyspnea    Endometriosis 01/18/01   Family history of adverse reaction to anesthesia    "mom wakes up during procedures"   Fatty liver    Fibromyalgia    Gastric ulcer    GERD (gastroesophageal reflux disease)    Glaucoma of both eyes    H/O hiatal hernia    High cholesterol    History of colon polyps    Hypertension    Hypothyroidism, postsurgical    IBS (irritable bowel syndrome)    IC (interstitial cystitis)    Interstitial cystitis    Lymphedema    Migraine    "monthly" (05/17/2017)  02/06/2019- more frequently   OSA on CPAP    Osteoarthritis    "all over; mainly knees and back" (05/17/2017)   PONV (postoperative nausea and vomiting)    PTSD (post-traumatic stress disorder)    PTSD (post-traumatic stress disorder)    Sickle cell trait (HCC)    Sjogren's syndrome (HCC)    Thyroid cancer ([place])    Tremor 12/14/2014   hand   Uterine fibroid    Varicose veins    Vestibular dizziness    Vocal cord paralysis, unilateral complete    "left";  POST TOTAL THYROIDECTOMY    Patient Active Problem List   Diagnosis Date Noted   Dizziness 10/11/2020   Colitis with rectal bleeding 05/16/2017   Chronic pain    Nausea and vomiting    Fibromyalgia 02/21/2016  Tremor 12/14/2014   Diabetic neuropathy, type II diabetes mellitus ([place]) 12/14/2014   Varicose veins of lower extremities with other complications 16/02/9603   Leg swelling 07/25/2012   SOB (shortness of breath) 02/22/2012   Chronic pelvic pain in female 06/26/2000    Past Surgical History:  Procedure Laterality Date   ACHILLES TENDON REPAIR Left 2007   BUNIONECTOMY WITH HAMMERTOE RECONSTRUCTION AND GASTROC SLIDE Left FEB  2011   REMOVAL HARDWARE  NOV  2011   CARDIAC CATHETERIZATION N/A 10/28/2015   Procedure: Right Heart Cath;  Surgeon: Adrian Prows, MD;  Location: [facility];  Service: Cardiovascular;  Laterality: N/A;   COLONOSCOPY WITH ESOPHAGOGASTRODUODENOSCOPY (EGD)  MULTIPLE --  LAST ONE    NOV  2014   CYSTO/  BILATERAL RETROGRADE PYELOGRAM/  HYDRODISTENTION/  INSTILLATION THERAPY  05-08-2003   CYSTO/  HYDRODISTENTION/  INSTILLATION THERAPY  06-04-2005  &  03-04-2007   CYSTO/  URETHRAL DILATION /  BILATERAL UNROOFING URETEROCELE/  BILATERAL URETERAL STENT PLACEMENT  12-01-2002   D & C HYSTEROSCOPY/  REMOVAL IUD  12-06-2003   DE QUERVAIN'S RELEASE Left ?09/2005   DIAGNOSTIC LAPAROSCOPY     DILATATION AND CURETTAGE WITH SUCTION  05-29-2005   RETAINED PLACENTA   DILATION AND CURETTAGE OF UTERUS     DX LAPAROSCOPY/  PELVIC BX'S/  LYSIS ADHESIONS  06-26-2000   FOOT HARDWARE REMOVAL Left 03/2010   REMOVAL HARDWARE "related to earlier hammertoe OR"   HYDRADENITIS EXCISION  06/29/2011   Procedure: EXCISION HYDRADENITIS AXILLA;  Surgeon: Hermelinda Dellen, MD;  Location: [facility];  Service: Plastics;;  right breast hydradenitis excioion   HYDRADENITIS EXCISION Left 2000   INCISION AND DRAINAGE ABSCESS ANAL  02-14-2001   KNEE ARTHROSCOPY WITH LATERAL MENISECTOMY Right 09/08/2013   Procedure: RIGHT KNEE ARTHROSCOPY PARTIAL LATERAL MENISCECTOMY AND DEBRIDEMENT ;  Surgeon: Johnn Hai, MD;  Location: [facility];  Service: Orthopedics;  Laterality: Right;   LAPAROSCOPY ABDOMEN DIAGNOSTIC  SEPT  2003   [place]   AND PLACEMENT IUD   MASS EXCISION  10/29/2003   WIDE EXCISION CHEST AREA   PILONIDAL CYST EXCISION  1983   RADIOLOGY WITH ANESTHESIA N/A 02/07/2019   Procedure: MRI BRAIN WITHOUT IV CONTRAST; MRI CERVICAL, THORACIC, AND LUMBAR SPINE WITH AND WITHOUT CONTRAST;  Surgeon: Radiologist, Medication, MD;  Location: [facility];  Service: Radiology;  Laterality: N/A;   RADIOLOGY WITH ANESTHESIA N/A 02/15/2020   Procedure: MRI LUMBAR WITHOUT CONTRAST;  Surgeon: Radiologist, Medication, MD;  Location: [facility];  Service: Radiology;  Laterality: N/A;   REDUCTION MAMMAPLASTY  1997   TEE WITHOUT CARDIOVERSION N/A 09/26/2015   Procedure: TRANSESOPHAGEAL  ECHOCARDIOGRAM (TEE);  Surgeon: Adrian Prows, MD;  Location: [facility];  Service: Cardiovascular;  Laterality: N/A;   TEMPORAL ARTERY BIOPSY / LIGATION Left 2010   TEMPORAL ARTERY BIOPSY / LIGATION Right 06-16-2002   TONSILLECTOMY  1990   TOTAL THYROIDECTOMY  11/06/2008     OB History     Gravida  1   Para  1   Term      Preterm      AB      Living  1      SAB      IAB      Ectopic      Multiple      Live Births  1           Family History  Problem Relation Age of Onset   Atrial fibrillation  Mother    Diabetes Mother    Thyroid disease Mother    Cushing syndrome Mother    Tremor Mother    Atrial fibrillation Father    Glaucoma Father    Tremor Sister    Cancer Paternal Aunt        breast, colon    Social History   Tobacco Use   Smoking status: Never   Smokeless tobacco: Never  Vaping Use   Vaping Use: Never used  Substance Use Topics   Alcohol use: Not Currently    Comment: rare   Drug use: No    Home Medications Prior to Admission medications   Medication Sig Start Date End Date Taking? Authorizing Provider  levofloxacin (LEVAQUIN) 500 MG tablet Take 1 tablet (500 mg total) by mouth daily. 05/08/21  Yes Camara Rosander T, PA-C  meclizine (ANTIVERT) 25 MG tablet Take 1 tablet (25 mg total) by mouth 3 (three) times daily as needed for dizziness. 05/08/21  Yes Normalee Sistare T, PA-C  acetaminophen (TYLENOL) 500 MG tablet Take 1,000 mg by mouth every 6 (six) hours as needed for headache.    [provider]  albuterol (PROVENTIL HFA;VENTOLIN HFA) 108 (90 BASE) MCG/ACT inhaler Inhale 2 puffs into the lungs every 4 (four) hours as needed for wheezing or shortness of breath.     [provider]  atorvastatin (LIPITOR) 20 MG tablet Take 20 mg by mouth daily.    [provider]  buprenorphine (BUTRANS) 10 MCG/HR [place] 1 patch onto the skin once a week.    [provider]  cetirizine (ZYRTEC) 10 MG chewable  tablet Chew 10 mg by mouth daily.    [provider]  Diclofenac Sodium 3 % GEL Apply topically. 10/10/20   [provider]  dicyclomine (BENTYL) 20 MG tablet Take 1 tablet (20 mg total) by mouth 2 (two) times daily. 03/20/20   Marcello Fennel, PA-C  DULERA 200-5 MCG/ACT AERO Inhale 2 puffs into the lungs 2 (two) times daily. 05/03/17   [provider]  DULoxetine (CYMBALTA) 30 MG capsule Take 60 mg by mouth at bedtime. 01/24/20   [provider]  hydrOXYzine (ATARAX/VISTARIL) 50 MG tablet Take 50 mg by mouth 4 (four) times daily. 09/15/16   [provider]  Lidocaine 0.5 % GEL Apply 1 application topically 3 (three) times daily as needed (pain).     [provider]  LYRICA 75 MG capsule Take 75 mg by mouth 2 (two) times daily.  07/24/16   [provider]  Melatonin 10 MG CAPS Take 10 mg by mouth at bedtime.     [provider]  metFORMIN (GLUCOPHAGE) 500 MG tablet Take 500 mg by mouth 2 (two) times daily with a meal.    [provider]  omeprazole (PRILOSEC) 40 MG capsule Take 40 mg by mouth daily.    [provider]  ondansetron (ZOFRAN-ODT) 8 MG disintegrating tablet Take 8 mg by mouth every 8 (eight) hours as needed for nausea or vomiting.    [provider]  OVER THE COUNTER MEDICATION Apply 1 application topically 3 (three) times daily. CBD cream    [provider]  oxybutynin (DITROPAN XL) 15 MG 24 hr tablet Take 15 mg by mouth daily. 10/07/20   [provider]  oxycodone (ROXICODONE) 30 MG immediate release tablet Take 30 mg by mouth every 4 (four) hours as needed for pain.    [provider]  predniSONE (DELTASONE) 20 MG tablet  3 Tabs PO Days 1-3, then 2 tabs PO Days 4-6, then 1 tab PO Day 7-9, then Half Tab PO Day 10-12 06/23/20   Carlisle Cater, PA-C  promethazine (PHENERGAN) 12.5 MG suppository Place 1 suppository (12.5 mg total) rectally every 6 (six) hours as needed for  up to 12 days for nausea or vomiting. 03/20/20 04/01/20  Marcello Fennel, PA-C  promethazine (PHENERGAN) 25 MG tablet Take 25 mg by mouth every 6 (six) hours as needed for nausea or vomiting.    [provider]  SYMBICORT 160-4.5 MCG/ACT inhaler Inhale into the lungs. 11/06/20   [provider]  thyroid (ARMOUR) 120 MG tablet Take 120 mg by mouth daily before breakfast.    [provider]  tiZANidine (ZANAFLEX) 4 MG tablet Take 4 mg by mouth 3 (three) times daily. 10/10/20   [provider]  topiramate (TOPAMAX) 25 MG tablet Take one tablet at night for one week, then take 2 tablets at night for one week, then take 3 tablets at night. 10/11/20   Kathrynn Ducking, MD  triamterene-hydrochlorothiazide (MAXZIDE-25) 37.5-25 MG tablet Take 1 tablet by mouth daily. 03/01/20   [provider]  verapamil (CALAN-SR) 120 MG CR tablet Take 120 mg by mouth daily. 09/12/20   [provider]  Vitamin D, Ergocalciferol, (DRISDOL) 1.25 MG (50000 UNIT) CAPS capsule Take 50,000 Units by mouth once a week. Tuesday 01/24/20   [provider]  lisinopril (ZESTRIL) 20 MG tablet Take 1 tablet (20 mg total) by mouth daily. 02/10/20 02/10/20  Milton Ferguson, MD    Allergies    Food, Other, Adhesive [tape], Bactrim, Clarithromycin, Ibuprofen, Nexium [esomeprazole], Nsaids, Peg 3350-electrolytes, Sulfa antibiotics, Tisagenlecleucel, Tolmetin, Trimethoprim, and Latex  Review of Systems   Review of Systems  Constitutional:  Positive for chills. Negative for fever.  HENT:  Positive for ear pain. Negative for congestion, sore throat and trouble swallowing.   Respiratory:  Negative for cough.   Cardiovascular:  Positive for chest pain.  Gastrointestinal:  Positive for abdominal pain, constipation, diarrhea and nausea. Negative for blood in stool and vomiting.  Genitourinary:  Positive for decreased urine volume. Negative for dysuria, flank pain, frequency and  hematuria.  Neurological:  Positive for dizziness. Negative for syncope, weakness, numbness and headaches.  All other systems reviewed and are negative.  Physical Exam Updated Vital Signs BP 118/74    Pulse (!) 106    Temp 98.1 F (36.7 C) (Oral)    Resp 18    Ht 5\' 4"  (1.626 m)    Wt 98 kg    LMP 02/04/2018    SpO2 100%    BMI 37.08 kg/m   Physical Exam Vitals and nursing note reviewed.  Constitutional:      Appearance: Normal appearance.  HENT:     Head: Normocephalic and atraumatic.     Right Ear: Tympanic membrane, ear canal and external ear normal.     Left Ear: Tympanic membrane, ear canal and external ear normal.     Nose: Nose normal.     Mouth/Throat:     Mouth: Mucous membranes are moist.     Pharynx: Oropharynx is clear. No oropharyngeal exudate or posterior oropharyngeal erythema.  Eyes:     Conjunctiva/sclera: Conjunctivae normal.  Cardiovascular:     Rate and Rhythm: Normal rate and regular rhythm.  Pulmonary:     Effort: Pulmonary effort is normal. No respiratory distress.     Breath sounds: Normal breath sounds.  Abdominal:  General: Abdomen is flat. There is no distension.     Palpations: Abdomen is soft.     Tenderness: There is abdominal tenderness in the suprapubic area. There is no right CVA tenderness, left CVA tenderness, guarding or rebound.  Musculoskeletal:        General: Normal range of motion.     Cervical back: Neck supple. No rigidity.  Skin:    General: Skin is warm and dry.  Neurological:     General: No focal deficit present.     Mental Status: She is alert.     Comments: Neuro: Speech is clear, able to follow commands. CN III-XII intact grossly intact. PERRLA. EOMI. Sensation intact throughout. Str 5/5 all extremities.     ED Results / Procedures / Treatments   Labs (all labs ordered are listed, but only abnormal results are displayed) Labs Reviewed  CBC WITH DIFFERENTIAL/PLATELET - Abnormal; Notable for the following components:       Result Value   WBC 16.8 (*)    Neutro Abs 11.2 (*)    Monocytes Absolute 1.4 (*)    All other components within normal limits  URINALYSIS, ROUTINE W REFLEX MICROSCOPIC - Abnormal; Notable for the following components:   Hgb urine dipstick TRACE (*)    Leukocytes,Ua MODERATE (*)    All other components within normal limits  COMPREHENSIVE METABOLIC PANEL  TROPONIN I (HIGH SENSITIVITY)  TROPONIN I (HIGH SENSITIVITY)    EKG None  Radiology DG Chest 2 View  Result Date: 05/08/2021 CLINICAL DATA:  Chest pain. EXAM: CHEST - 2 VIEW COMPARISON:  Chest x-ray 12/02/2015 FINDINGS: The heart size and mediastinal contours are within normal limits. Both lungs are clear. The visualized skeletal structures are unremarkable. IMPRESSION: No active cardiopulmonary disease. Electronically Signed   By: Ronney Asters M.D.   On: 05/08/2021 19:48    Procedures Procedures   Medications Ordered in ED Medications - No data to display  ED Course  I have reviewed the triage vital signs and the nursing notes.  Pertinent labs & imaging results that were available during my care of the patient were reviewed by me and considered in my medical decision making (see chart for details).    MDM Rules/Calculators/A&P                          Patient is a 55 year old female who presents to the emergency department with multiple complaints.  Patient is complaining of episodic vertigo and abdominal pain. She has also had intermittent chest pain.   On my exam patient is afebrile she is tachycardic to 106, not hypoxic, and is no acute distress.  Overall exam benign, with suprapubic tenderness to palpation.  No guarding or rigidity. Neurologic exam normal as above.  Overall patient is clinically well appearing. Urinalysis positive for urinary tract infection with hematuria. Leukocytosis of 16,000. Will treat with antibiotics and provide meclizine script for recurrence of vertigo with directions to follow up with  PCP.   Patient is to be discharged with recommendation to follow up with PCP in regards to today's hospital visit. Chest pain is not likely of cardiac or pulmonary etiology d/t presentation, PERC negative, VSS, no tracheal deviation, no JVD or new murmur, RRR, breath sounds equal bilaterally, EKG without acute abnormalities, negative troponin, and negative CXR. Heart score for adverse cardiac events = 3. Pt has been advised to return to the ED if CP becomes exertional, associated with diaphoresis or nausea, radiates to  left jaw/arm, worsens or becomes concerning in any way. Pt appears reliable for follow up and is agreeable to discharge.   Final Clinical Impression(s) / ED Diagnoses Final diagnoses:  Acute cystitis with hematuria  Episodic recurrent vertigo, unspecified laterality    Rx / DC Orders ED Discharge Orders          Ordered    meclizine (ANTIVERT) 25 MG tablet  3 times daily PRN        05/08/21 2228    levofloxacin (LEVAQUIN) 500 MG tablet  Daily        05/08/21 2228           Portions of this report may have been transcribed using voice recognition software. Every effort was made to ensure accuracy; however, inadvertent computerized transcription errors may be present.    Estill Cotta 05/08/21 2252    Daleen Bo, MD 05/08/21 [id number]

## 2021-05-08 NOTE — ED Triage Notes (Signed)
Pt c/o intermittent abdominal pain, dizziness, nausea, elevated BP. Pt BP 144/97 in triage. Pt also c/o pain to back of neck. Pt states she was seen last month for same symptoms and her symptoms have persisted since. Pt states she has caught herself from falling, due to dizzy spell.

## 2021-05-08 NOTE — ED Provider Notes (Signed)
Emergency Medicine Provider Triage Evaluation Note  Alexis [name] , a 55 y.o. female  was evaluated in triage.  Pt complains of high blood pressure.  She states that she has been taking 2 antihypertensive medications as prescribed but continues to have elevated blood pressures at home.  She states that today she began having pain on the right side of her neck which she states happens when her blood pressure is elevated.  She also complains of chest pain, shortness of breath, abdominal pain, changes to her vision, nausea without vomiting.  She also endorses dizziness and lightheadedness.  She states she has had to catch herself from falling due to her dizziness.  She denies fevers, diarrhea.    Review of Systems  Positive: See above Negative:   Physical Exam  BP (!) 144/97 (BP Location: Left Arm)    Pulse (!) 108    Temp 98.1 F (36.7 C) (Oral)    Resp 16    Ht 5\' 4"  (1.626 m)    Wt 98 kg    LMP 02/04/2018    SpO2 93%    BMI 37.08 kg/m  Gen:   Awake, no distress , alert and oriented Resp:  Normal effort, lungs clear to auscultation bilaterally MSK:   Moves extremities without difficulty  Other:  No focal neurological deficits.  No nystagmus.  PERRLA.  S1-S2 without murmur.  Tachycardia.  Medical Decision Making  Medically screening exam initiated at 7:31 PM.  Appropriate orders placed.  [name] was informed that the remainder of the evaluation will be completed by another provider, this initial triage assessment does not replace that evaluation, and the importance of remaining in the ED until their evaluation is complete.    Mickie Hillier, PA-C 05/08/21 1933    Fredia Sorrow, MD 05/21/21 1539

## 2021-05-08 NOTE — Discharge Instructions (Addendum)
I prescribing you a medicine called meclizine for your vertigo, you can take 1 tablet by mouth up to 3 times daily as needed for dizziness.  Also prescribing you an antibiotic for your urinary tract infection that I like you to take once daily for the next 7 days.  Continue to monitor how you're doing and return to the ER for new or worsening symptoms such as chest pain with exertion, or inability to urinate.   It has been a pleasure seeing and caring for you today and I hope you start feeling better soon!

## 2021-11-26 ENCOUNTER — Ambulatory Visit: Payer: Medicaid Other | Admitting: Student

## 2022-01-28 ENCOUNTER — Inpatient Hospital Stay (HOSPITAL_COMMUNITY)
Admission: EM | Admit: 2022-01-28 | Discharge: 2022-01-31 | DRG: 313 | Disposition: A | Discharge status: Home Health | Payer: Medicaid Other | Attending: Internal Medicine | Admitting: Internal Medicine

## 2022-01-28 ENCOUNTER — Other Ambulatory Visit (HOSPITAL_COMMUNITY): Payer: Medicaid Other

## 2022-01-28 ENCOUNTER — Observation Stay (HOSPITAL_COMMUNITY): Payer: Medicaid Other

## 2022-01-28 ENCOUNTER — Other Ambulatory Visit: Payer: Self-pay

## 2022-01-28 ENCOUNTER — Emergency Department (HOSPITAL_COMMUNITY): Payer: Medicaid Other

## 2022-01-28 DIAGNOSIS — I110 Hypertensive heart disease with heart failure: Secondary | ICD-10-CM | POA: Diagnosis present

## 2022-01-28 DIAGNOSIS — M797 Fibromyalgia: Secondary | ICD-10-CM | POA: Diagnosis present

## 2022-01-28 DIAGNOSIS — I959 Hypotension, unspecified: Secondary | ICD-10-CM | POA: Diagnosis present

## 2022-01-28 DIAGNOSIS — K219 Gastro-esophageal reflux disease without esophagitis: Secondary | ICD-10-CM | POA: Diagnosis present

## 2022-01-28 DIAGNOSIS — F4310 Post-traumatic stress disorder, unspecified: Secondary | ICD-10-CM | POA: Diagnosis present

## 2022-01-28 DIAGNOSIS — Z803 Family history of malignant neoplasm of breast: Secondary | ICD-10-CM

## 2022-01-28 DIAGNOSIS — F32A Depression, unspecified: Secondary | ICD-10-CM | POA: Diagnosis present

## 2022-01-28 DIAGNOSIS — Z83511 Family history of glaucoma: Secondary | ICD-10-CM

## 2022-01-28 DIAGNOSIS — N809 Endometriosis, unspecified: Secondary | ICD-10-CM | POA: Diagnosis present

## 2022-01-28 DIAGNOSIS — R079 Chest pain, unspecified: Secondary | ICD-10-CM | POA: Diagnosis present

## 2022-01-28 DIAGNOSIS — Z8711 Personal history of peptic ulcer disease: Secondary | ICD-10-CM

## 2022-01-28 DIAGNOSIS — Z85850 Personal history of malignant neoplasm of thyroid: Secondary | ICD-10-CM

## 2022-01-28 DIAGNOSIS — R61 Generalized hyperhidrosis: Secondary | ICD-10-CM

## 2022-01-28 DIAGNOSIS — Z91040 Latex allergy status: Secondary | ICD-10-CM

## 2022-01-28 DIAGNOSIS — M3500 Sicca syndrome, unspecified: Secondary | ICD-10-CM | POA: Diagnosis present

## 2022-01-28 DIAGNOSIS — L732 Hidradenitis suppurativa: Secondary | ICD-10-CM | POA: Diagnosis present

## 2022-01-28 DIAGNOSIS — Z20822 Contact with and (suspected) exposure to covid-19: Secondary | ICD-10-CM | POA: Diagnosis present

## 2022-01-28 DIAGNOSIS — R0609 Other forms of dyspnea: Principal | ICD-10-CM

## 2022-01-28 DIAGNOSIS — E876 Hypokalemia: Secondary | ICD-10-CM | POA: Diagnosis present

## 2022-01-28 DIAGNOSIS — Z833 Family history of diabetes mellitus: Secondary | ICD-10-CM

## 2022-01-28 DIAGNOSIS — R0789 Other chest pain: Principal | ICD-10-CM | POA: Diagnosis present

## 2022-01-28 DIAGNOSIS — K760 Fatty (change of) liver, not elsewhere classified: Secondary | ICD-10-CM | POA: Diagnosis present

## 2022-01-28 DIAGNOSIS — I5032 Chronic diastolic (congestive) heart failure: Secondary | ICD-10-CM | POA: Diagnosis present

## 2022-01-28 DIAGNOSIS — Z881 Allergy status to other antibiotic agents status: Secondary | ICD-10-CM

## 2022-01-28 DIAGNOSIS — R7989 Other specified abnormal findings of blood chemistry: Secondary | ICD-10-CM | POA: Diagnosis present

## 2022-01-28 DIAGNOSIS — M7989 Other specified soft tissue disorders: Secondary | ICD-10-CM | POA: Diagnosis present

## 2022-01-28 DIAGNOSIS — I2510 Atherosclerotic heart disease of native coronary artery without angina pectoris: Secondary | ICD-10-CM | POA: Diagnosis present

## 2022-01-28 DIAGNOSIS — M1711 Unilateral primary osteoarthritis, right knee: Secondary | ICD-10-CM | POA: Diagnosis present

## 2022-01-28 DIAGNOSIS — Z800 Family history of malignant neoplasm of digestive organs: Secondary | ICD-10-CM

## 2022-01-28 DIAGNOSIS — R0602 Shortness of breath: Secondary | ICD-10-CM | POA: Diagnosis not present

## 2022-01-28 DIAGNOSIS — E7800 Pure hypercholesterolemia, unspecified: Secondary | ICD-10-CM | POA: Diagnosis present

## 2022-01-28 DIAGNOSIS — E1140 Type 2 diabetes mellitus with diabetic neuropathy, unspecified: Secondary | ICD-10-CM | POA: Diagnosis not present

## 2022-01-28 DIAGNOSIS — I10 Essential (primary) hypertension: Secondary | ICD-10-CM | POA: Diagnosis not present

## 2022-01-28 DIAGNOSIS — Z7951 Long term (current) use of inhaled steroids: Secondary | ICD-10-CM

## 2022-01-28 DIAGNOSIS — I259 Chronic ischemic heart disease, unspecified: Secondary | ICD-10-CM

## 2022-01-28 DIAGNOSIS — R002 Palpitations: Secondary | ICD-10-CM | POA: Diagnosis present

## 2022-01-28 DIAGNOSIS — Z79899 Other long term (current) drug therapy: Secondary | ICD-10-CM

## 2022-01-28 DIAGNOSIS — Z888 Allergy status to other drugs, medicaments and biological substances status: Secondary | ICD-10-CM

## 2022-01-28 DIAGNOSIS — G8929 Other chronic pain: Secondary | ICD-10-CM | POA: Diagnosis not present

## 2022-01-28 DIAGNOSIS — G4733 Obstructive sleep apnea (adult) (pediatric): Secondary | ICD-10-CM | POA: Diagnosis present

## 2022-01-28 DIAGNOSIS — D573 Sickle-cell trait: Secondary | ICD-10-CM | POA: Diagnosis present

## 2022-01-28 DIAGNOSIS — N179 Acute kidney failure, unspecified: Secondary | ICD-10-CM | POA: Diagnosis present

## 2022-01-28 DIAGNOSIS — E1142 Type 2 diabetes mellitus with diabetic polyneuropathy: Secondary | ICD-10-CM | POA: Diagnosis present

## 2022-01-28 DIAGNOSIS — E785 Hyperlipidemia, unspecified: Secondary | ICD-10-CM | POA: Diagnosis present

## 2022-01-28 DIAGNOSIS — J45909 Unspecified asthma, uncomplicated: Secondary | ICD-10-CM | POA: Diagnosis present

## 2022-01-28 DIAGNOSIS — Z8719 Personal history of other diseases of the digestive system: Secondary | ICD-10-CM

## 2022-01-28 DIAGNOSIS — M549 Dorsalgia, unspecified: Secondary | ICD-10-CM | POA: Diagnosis present

## 2022-01-28 DIAGNOSIS — E890 Postprocedural hypothyroidism: Secondary | ICD-10-CM | POA: Diagnosis present

## 2022-01-28 DIAGNOSIS — Z8349 Family history of other endocrine, nutritional and metabolic diseases: Secondary | ICD-10-CM

## 2022-01-28 LAB — CBC
HCT: 40.8 % (ref 36.0–46.0)
Hemoglobin: 13.4 g/dL (ref 12.0–15.0)
MCH: 28.8 pg (ref 26.0–34.0)
MCHC: 32.8 g/dL (ref 30.0–36.0)
MCV: 87.7 fL (ref 80.0–100.0)
Platelets: 411 10*3/uL — ABNORMAL HIGH (ref 150–400)
RBC: 4.65 MIL/uL (ref 3.87–5.11)
RDW: 13.2 % (ref 11.5–15.5)
WBC: 11.2 10*3/uL — ABNORMAL HIGH (ref 4.0–10.5)
nRBC: 0 % (ref 0.0–0.2)

## 2022-01-28 LAB — TROPONIN I (HIGH SENSITIVITY)
Troponin I (High Sensitivity): 5 ng/L (ref <18)
Troponin I (High Sensitivity): 4 ng/L (ref <18)

## 2022-01-28 LAB — BASIC METABOLIC PANEL
Anion gap: 8 (ref 5–15)
BUN: 6 mg/dL (ref 6–20)
CO2: 31 mmol/L (ref 22–32)
Calcium: 9.6 mg/dL (ref 8.9–10.3)
Chloride: 100 mmol/L (ref 98–111)
Creatinine, Ser: 0.93 mg/dL (ref 0.44–1.00)
GFR, Estimated: >60 mL/min (ref >60)
Glucose, Bld: 121 mg/dL — ABNORMAL HIGH (ref 70–99)
Potassium: 3.3 mmol/L — ABNORMAL LOW (ref 3.5–5.1)
Sodium: 139 mmol/L (ref 135–145)

## 2022-01-28 LAB — HEMOGLOBIN A1C
Hgb A1c MFr Bld: 6.3 % — ABNORMAL HIGH (ref 4.8–5.6)
Mean Plasma Glucose: 134.11 mg/dL

## 2022-01-28 LAB — HEPATIC FUNCTION PANEL
ALT: 15 U/L (ref 0–44)
AST: 18 U/L (ref 15–41)
Albumin: 4 g/dL (ref 3.5–5.0)
Alkaline Phosphatase: 113 U/L (ref 38–126)
Bilirubin, Direct: <0.1 mg/dL (ref 0.0–0.2)
Total Bilirubin: 0.7 mg/dL (ref 0.3–1.2)
Total Protein: 7.8 g/dL (ref 6.5–8.1)

## 2022-01-28 LAB — HIV ANTIBODY (ROUTINE TESTING W REFLEX): HIV Screen 4th Generation wRfx: NONREACTIVE

## 2022-01-28 LAB — GLUCOSE, CAPILLARY: Glucose-Capillary: 102 mg/dL — ABNORMAL HIGH (ref 70–99)

## 2022-01-28 LAB — BRAIN NATRIURETIC PEPTIDE: B Natriuretic Peptide: 6.5 pg/mL (ref 0.0–100.0)

## 2022-01-28 LAB — CBG MONITORING, ED: Glucose-Capillary: 116 mg/dL — ABNORMAL HIGH (ref 70–99)

## 2022-01-28 LAB — D-DIMER, QUANTITATIVE: D-Dimer, Quant: 0.79 ug/mL-FEU — ABNORMAL HIGH (ref 0.00–0.50)

## 2022-01-28 LAB — TSH: TSH: 2.323 u[IU]/mL (ref 0.350–4.500)

## 2022-01-28 MED ORDER — DULOXETINE HCL 60 MG PO CPEP
60.0000 mg | ORAL_CAPSULE | Freq: Every day | ORAL | Status: DC
  Administered 2022-01-28 – 2022-01-30 (×3): 60 mg via ORAL
  Filled 2022-01-28 (×3): qty 1

## 2022-01-28 MED ORDER — HYDRALAZINE HCL 50 MG PO TABS: 50.0000 mg | ORAL_TABLET | Freq: Three times a day (TID) | ORAL | Status: DC

## 2022-01-28 MED ORDER — ASPIRIN 81 MG PO CHEW
324.0000 mg | CHEWABLE_TABLET | Freq: Once | ORAL | Status: AC
Start: 2022-01-28 — End: 2022-01-28
  Administered 2022-01-28: 324 mg via ORAL
  Filled 2022-01-28: qty 4

## 2022-01-28 MED ORDER — THYROID 60 MG PO TABS
120.0000 mg | ORAL_TABLET | Freq: Every day | ORAL | Status: DC
  Administered 2022-01-29 – 2022-01-31 (×3): 120 mg via ORAL
  Filled 2022-01-28 (×5): qty 2

## 2022-01-28 MED ORDER — INSULIN ASPART 100 UNIT/ML IJ SOLN
0.0000 [IU] | Freq: Three times a day (TID) | INTRAMUSCULAR | Status: DC
  Administered 2022-01-29 – 2022-01-31 (×4): 1 [IU] via SUBCUTANEOUS

## 2022-01-28 MED ORDER — ATORVASTATIN CALCIUM 10 MG PO TABS
20.0000 mg | ORAL_TABLET | Freq: Every day | ORAL | Status: DC
  Administered 2022-01-28 – 2022-01-30 (×3): 20 mg via ORAL
  Filled 2022-01-28 (×3): qty 2

## 2022-01-28 MED ORDER — NALOXONE HCL 0.4 MG/ML IJ SOLN: 0.4000 mg | INTRAMUSCULAR | Status: DC | PRN

## 2022-01-28 MED ORDER — ALUM & MAG HYDROXIDE-SIMETH 200-200-20 MG/5ML PO SUSP
30.0000 mL | Freq: Once | ORAL | Status: AC
  Administered 2022-01-28: 30 mL via ORAL
  Filled 2022-01-28: qty 30

## 2022-01-28 MED ORDER — VERAPAMIL HCL ER 120 MG PO TBCR: 120.0000 mg | EXTENDED_RELEASE_TABLET | Freq: Every day | ORAL | Status: DC

## 2022-01-28 MED ORDER — FUROSEMIDE 20 MG PO TABS
40.0000 mg | ORAL_TABLET | Freq: Once | ORAL | Status: AC
  Administered 2022-01-28: 40 mg via ORAL
  Filled 2022-01-28: qty 2

## 2022-01-28 MED ORDER — TOPIRAMATE 25 MG PO TABS: 50.0000 mg | ORAL_TABLET | Freq: Every day | ORAL | Status: DC

## 2022-01-28 MED ORDER — POTASSIUM CHLORIDE CRYS ER 20 MEQ PO TBCR
40.0000 meq | EXTENDED_RELEASE_TABLET | Freq: Once | ORAL | Status: AC
  Administered 2022-01-28: 40 meq via ORAL
  Filled 2022-01-28: qty 2

## 2022-01-28 MED ORDER — HYDRALAZINE HCL 20 MG/ML IJ SOLN: 10.0000 mg | Freq: Four times a day (QID) | INTRAMUSCULAR | Status: DC | PRN

## 2022-01-28 MED ORDER — CARVEDILOL 6.25 MG PO TABS
6.2500 mg | ORAL_TABLET | Freq: Two times a day (BID) | ORAL | Status: DC
  Administered 2022-01-28 – 2022-01-31 (×6): 6.25 mg via ORAL
  Filled 2022-01-28: qty 1
  Filled 2022-01-28: qty 2
  Filled 2022-01-28 (×4): qty 1

## 2022-01-28 MED ORDER — INSULIN ASPART 100 UNIT/ML IJ SOLN: 0.0000 [IU] | Freq: Every day | INTRAMUSCULAR | Status: DC

## 2022-01-28 MED ORDER — MOMETASONE FURO-FORMOTEROL FUM 200-5 MCG/ACT IN AERO
2.0000 | INHALATION_SPRAY | Freq: Two times a day (BID) | RESPIRATORY_TRACT | Status: DC
  Administered 2022-01-28 – 2022-01-31 (×3): 2 via RESPIRATORY_TRACT
  Filled 2022-01-28 (×2): qty 8.8

## 2022-01-28 MED ORDER — TIZANIDINE HCL 4 MG PO TABS
6.0000 mg | ORAL_TABLET | Freq: Once | ORAL | Status: AC
  Administered 2022-01-29: 6 mg via ORAL
  Filled 2022-01-28: qty 1

## 2022-01-28 MED ORDER — ISOSORBIDE MONONITRATE ER 30 MG PO TB24: 60.0000 mg | ORAL_TABLET | Freq: Every day | ORAL | Status: DC

## 2022-01-28 MED ORDER — CARVEDILOL 3.125 MG PO TABS: 6.2500 mg | ORAL_TABLET | Freq: Two times a day (BID) | ORAL | Status: DC

## 2022-01-28 MED ORDER — PROMETHAZINE HCL 25 MG PO TABS: 25.0000 mg | ORAL_TABLET | Freq: Four times a day (QID) | ORAL | Status: DC | PRN

## 2022-01-28 MED ORDER — LOSARTAN POTASSIUM 50 MG PO TABS
50.0000 mg | ORAL_TABLET | Freq: Every day | ORAL | Status: DC
  Administered 2022-01-28: 50 mg via ORAL
  Filled 2022-01-28: qty 1

## 2022-01-28 MED ORDER — PANTOPRAZOLE SODIUM 40 MG PO TBEC
40.0000 mg | DELAYED_RELEASE_TABLET | Freq: Every day | ORAL | Status: DC
  Administered 2022-01-29 – 2022-01-31 (×3): 40 mg via ORAL
  Filled 2022-01-28 (×4): qty 1

## 2022-01-28 MED ORDER — ONDANSETRON HCL 4 MG/2ML IJ SOLN
4.0000 mg | Freq: Four times a day (QID) | INTRAMUSCULAR | Status: DC | PRN
  Administered 2022-01-28 – 2022-01-31 (×5): 4 mg via INTRAVENOUS
  Filled 2022-01-28 (×6): qty 2

## 2022-01-28 MED ORDER — PREGABALIN 75 MG PO CAPS
75.0000 mg | ORAL_CAPSULE | Freq: Two times a day (BID) | ORAL | Status: DC
  Administered 2022-01-28 – 2022-01-31 (×4): 75 mg via ORAL
  Filled 2022-01-28 (×6): qty 1

## 2022-01-28 MED ORDER — LOSARTAN POTASSIUM 50 MG PO TABS: 25.0000 mg | ORAL_TABLET | Freq: Every day | ORAL | Status: DC

## 2022-01-28 MED ORDER — OXYCODONE HCL 5 MG PO TABS: 30.0000 mg | ORAL_TABLET | Freq: Four times a day (QID) | ORAL | Status: DC | PRN

## 2022-01-28 MED ORDER — OXYBUTYNIN CHLORIDE ER 5 MG PO TB24
15.0000 mg | ORAL_TABLET | Freq: Every day | ORAL | Status: DC
  Administered 2022-01-28 – 2022-01-31 (×4): 15 mg via ORAL
  Filled 2022-01-28 (×4): qty 1

## 2022-01-28 MED ORDER — ENOXAPARIN SODIUM 40 MG/0.4ML IJ SOSY
40.0000 mg | PREFILLED_SYRINGE | INTRAMUSCULAR | Status: DC
  Administered 2022-01-28 – 2022-01-31 (×4): 40 mg via SUBCUTANEOUS
  Filled 2022-01-28 (×4): qty 0.4

## 2022-01-28 MED ORDER — IOHEXOL 350 MG/ML SOLN
80.0000 mL | Freq: Once | INTRAVENOUS | Status: AC | PRN
  Administered 2022-01-28: 80 mL via INTRAVENOUS

## 2022-01-28 MED ORDER — TRIAMTERENE-HCTZ 37.5-25 MG PO TABS: 1.0000 | ORAL_TABLET | Freq: Every day | ORAL | Status: DC

## 2022-01-28 MED ORDER — OXYCODONE HCL 5 MG PO TABS
20.0000 mg | ORAL_TABLET | ORAL | Status: DC | PRN
  Administered 2022-01-28 – 2022-01-31 (×13): 20 mg via ORAL
  Filled 2022-01-28 (×13): qty 4

## 2022-01-28 MED ORDER — ACETAMINOPHEN 325 MG PO TABS
650.0000 mg | ORAL_TABLET | ORAL | Status: DC | PRN
  Administered 2022-01-31 (×2): 650 mg via ORAL
  Filled 2022-01-28 (×2): qty 2

## 2022-01-28 NOTE — ED Triage Notes (Addendum)
Pt. Stated, Alexis [name] had a lot of sweating that starts for no reason. Ive had 3 episodes of sweating a lot. Im feeling a lot of tired . Ive alot had a little chest pain but not much. My right knee started hurting and swelling since last Wednesday.

## 2022-01-28 NOTE — H&P (Addendum)
TRH H&P   Patient Demographics:    Alexis [name], is a 56 y.o. female  MRN: [id number]   DOB - 03-May-1966  Admit Date - 01/28/2022  Outpatient Primary MD for the patient is Benito Mccreedy, MD    Patient coming from: [facility] ER  Chief Complaint  Patient presents with   Excessive Sweating   Fatigue   Chest Pain   Knee Pain      HPI:    Alexis [name]  is a 56 y.o. 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  Patient presented with above dictated symptoms and complaints, in the ER EKG nonacute, chest x-ray stable, case was discussed by the ED physician with [facility] cardiology who requested a 23-hour observation and I was requested to admit the patient.  Currently besides above dictated symptoms review of systems otherwise are negative.    Review of systems:    A full 10 point Review of Systems was done, except as stated above, all  other Review of Systems were negative.   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[place])    "left"/CT (05/17/2017)   Anemia    ASD (atrial septal defect)    per pt   Asthma    Bilateral dry eyes    Cervical spondylosis    C5-6   CHF (congestive heart failure) (HCC)    not sure   Chronic back pain    Chronic bronchitis (HCC)    Chronic colitis    Chronic lower GI bleeding    Complication of anesthesia    has  awakened in past during surgery/  LEFT VOCAL CORD PARALYSIS  POST TOTAL THYROIDECTOMY  03-08-2009 ([facility])   Degenerative disk disease    "cervical to sacrum" (05/17/2017)   Depression    Diabetes mellitus without complication (HCC)    Diabetic neuropathy, type II diabetes mellitus ([place]) 12/14/2014   not on medication   DJD (degenerative joint disease) of knee    Dyspnea    Endometriosis 01/18/01   Family history of adverse reaction to anesthesia    "mom wakes up during procedures"   Fatty liver    Fibromyalgia    Gastric ulcer    GERD (gastroesophageal reflux disease)    Glaucoma of both eyes    H/O hiatal hernia    High cholesterol    History of colon polyps    Hypertension    Hypothyroidism, postsurgical    IBS (irritable bowel syndrome)    IC (interstitial cystitis)    Interstitial cystitis    Lymphedema    Migraine    "monthly" (05/17/2017)  02/06/2019- more frequently   OSA on CPAP    Osteoarthritis    "all over; mainly knees and back" (05/17/2017)   PONV (postoperative nausea and vomiting)    PTSD (post-traumatic stress disorder)    PTSD (post-traumatic stress disorder)    Sickle cell trait ([place])    Sjogren's syndrome ([place])    Thyroid cancer ([place])    Tremor 12/14/2014   hand   Uterine fibroid    Varicose veins    Vestibular dizziness    Vocal cord paralysis, unilateral complete    "left";  POST TOTAL THYROIDECTOMY      Past Surgical History:  Procedure Laterality Date    ACHILLES TENDON REPAIR Left 2007   BUNIONECTOMY WITH HAMMERTOE RECONSTRUCTION AND GASTROC SLIDE Left FEB  2011   REMOVAL HARDWARE  NOV  2011   CARDIAC CATHETERIZATION N/A 10/28/2015   Procedure: Right Heart Cath;  Surgeon: Adrian Prows, MD;  Location: [place] [facility];  Service: Cardiovascular;  Laterality: N/A;   COLONOSCOPY WITH ESOPHAGOGASTRODUODENOSCOPY (EGD)  MULTIPLE --  LAST ONE   NOV  2014   CYSTO/  BILATERAL RETROGRADE PYELOGRAM/  HYDRODISTENTION/  INSTILLATION THERAPY  05-08-2003   CYSTO/  HYDRODISTENTION/  INSTILLATION THERAPY  06-04-2005  &  03-04-2007   CYSTO/  URETHRAL DILATION /  BILATERAL UNROOFING URETEROCELE/  BILATERAL URETERAL STENT PLACEMENT  12-01-2002   D & C HYSTEROSCOPY/  REMOVAL IUD  12-06-2003   DE QUERVAIN'S RELEASE Left ?09/2005   DIAGNOSTIC LAPAROSCOPY     DILATATION AND CURETTAGE WITH SUCTION  05-29-2005   RETAINED PLACENTA   DILATION AND CURETTAGE OF UTERUS     DX LAPAROSCOPY/  PELVIC BX'S/  LYSIS ADHESIONS  06-26-2000   FOOT HARDWARE REMOVAL Left 03/2010   REMOVAL HARDWARE "related to earlier hammertoe OR"   HYDRADENITIS EXCISION  06/29/2011   Procedure: EXCISION HYDRADENITIS AXILLA;  Surgeon: Hermelinda Dellen, MD;  Location: [place];  Service: Plastics;;  right breast hydradenitis excioion   HYDRADENITIS EXCISION Left 2000   INCISION AND DRAINAGE ABSCESS ANAL  02-14-2001   KNEE ARTHROSCOPY WITH LATERAL MENISECTOMY Right 09/08/2013   Procedure: RIGHT KNEE ARTHROSCOPY PARTIAL LATERAL MENISCECTOMY AND DEBRIDEMENT ;  Surgeon: Johnn Hai, MD;  Location: [place];  Service: Orthopedics;  Laterality: Right;   LAPAROSCOPY ABDOMEN DIAGNOSTIC  SEPT  2003   [place]   AND PLACEMENT IUD   MASS EXCISION  10/29/2003   WIDE EXCISION CHEST  AREA   PILONIDAL CYST EXCISION  1983   RADIOLOGY WITH ANESTHESIA N/A 02/07/2019   Procedure: MRI BRAIN WITHOUT IV CONTRAST; MRI CERVICAL, THORACIC, AND LUMBAR SPINE WITH AND WITHOUT CONTRAST;   Surgeon: Radiologist, Medication, MD;  Location: [place];  Service: Radiology;  Laterality: N/A;   RADIOLOGY WITH ANESTHESIA N/A 02/15/2020   Procedure: MRI LUMBAR WITHOUT CONTRAST;  Surgeon: Radiologist, Medication, MD;  Location: [place];  Service: Radiology;  Laterality: N/A;   REDUCTION MAMMAPLASTY  1997   TEE WITHOUT CARDIOVERSION N/A 09/26/2015   Procedure: TRANSESOPHAGEAL ECHOCARDIOGRAM (TEE);  Surgeon: Adrian Prows, MD;  Location: [place];  Service: Cardiovascular;  Laterality: N/A;   TEMPORAL ARTERY BIOPSY / LIGATION Left 2010   TEMPORAL ARTERY BIOPSY / LIGATION Right 06-16-2002   TONSILLECTOMY  1990   TOTAL THYROIDECTOMY  11/06/2008      Social History:     Social History   Tobacco Use   Smoking status: Never   Smokeless tobacco: Never  Substance Use Topics   Alcohol use: Not Currently    Comment: rare         Family History :     Family History  Problem Relation Age of Onset   Atrial fibrillation Mother    Diabetes Mother    Thyroid disease Mother    Cushing syndrome Mother    Tremor Mother    Atrial fibrillation Father    Glaucoma Father    Tremor Sister    Cancer Paternal Aunt        breast, colon       Home Medications:   Prior to Admission medications   Medication Sig Start Date End Date Taking? Authorizing Provider  acetaminophen (TYLENOL) 500 MG tablet Take 1,000 mg by mouth every 6 (six) hours as needed for headache.    [provider]  albuterol (PROVENTIL HFA;VENTOLIN HFA) 108 (90 BASE) MCG/ACT inhaler Inhale 2 puffs into the lungs every 4 (four) hours as needed for wheezing or shortness of breath.     [provider]  atorvastatin (LIPITOR) 20 MG tablet Take 20 mg by mouth daily.    [provider]  buprenorphine (BUTRANS) 10 MCG/HR [place] 1 patch onto the skin once a week.    [provider]  cetirizine (ZYRTEC) 10 MG chewable tablet Chew 10 mg by mouth daily.    [provider]  Diclofenac  Sodium 3 % GEL Apply topically. 10/10/20   [provider]  dicyclomine (BENTYL) 20 MG tablet Take 1 tablet (20 mg total) by mouth 2 (two) times daily. 03/20/20   Marcello Fennel, PA-C  DULERA 200-5 MCG/ACT AERO Inhale 2 puffs into the lungs 2 (two) times daily. 05/03/17   [provider]  DULoxetine (CYMBALTA) 30 MG capsule Take 60 mg by mouth at bedtime. 01/24/20   [provider]  hydrOXYzine (ATARAX/VISTARIL) 50 MG tablet Take 50 mg by mouth 4 (four) times daily. 09/15/16   [provider]  levofloxacin (LEVAQUIN) 500 MG tablet Take 1 tablet (500 mg total) by mouth daily. 05/08/21   Roemhildt, Lorin T, PA-C  Lidocaine 0.5 % GEL Apply 1 application topically 3 (three) times daily as needed (pain).     [provider]  LYRICA 75 MG capsule Take 75 mg by mouth 2 (two) times daily.  07/24/16   [provider]  meclizine (ANTIVERT) 25 MG tablet Take 1 tablet (25 mg total) by mouth 3 (three) times daily as needed for dizziness. 05/08/21   Roemhildt,  Lorin T, PA-C  Melatonin 10 MG CAPS Take 10 mg by mouth at bedtime.     [provider]  metFORMIN (GLUCOPHAGE) 500 MG tablet Take 500 mg by mouth 2 (two) times daily with a meal.    [provider]  omeprazole (PRILOSEC) 40 MG capsule Take 40 mg by mouth daily.    [provider]  ondansetron (ZOFRAN-ODT) 8 MG disintegrating tablet Take 8 mg by mouth every 8 (eight) hours as needed for nausea or vomiting.    [provider]  OVER THE COUNTER MEDICATION Apply 1 application topically 3 (three) times daily. CBD cream    [provider]  oxybutynin (DITROPAN XL) 15 MG 24 hr tablet Take 15 mg by mouth daily. 10/07/20   [provider]  oxycodone (ROXICODONE) 30 MG immediate release tablet Take 30 mg by mouth every 4 (four) hours as needed for pain.    [provider]  predniSONE (DELTASONE) 20 MG tablet 3 Tabs PO Days 1-3, then 2 tabs PO Days 4-6,  then 1 tab PO Day 7-9, then Half Tab PO Day 10-12 06/23/20   Carlisle Cater, PA-C  promethazine (PHENERGAN) 12.5 MG suppository Place 1 suppository (12.5 mg total) rectally every 6 (six) hours as needed for up to 12 days for nausea or vomiting. 03/20/20 04/01/20  Marcello Fennel, PA-C  promethazine (PHENERGAN) 25 MG tablet Take 25 mg by mouth every 6 (six) hours as needed for nausea or vomiting.    [provider]  SYMBICORT 160-4.5 MCG/ACT inhaler Inhale into the lungs. 11/06/20   [provider]  thyroid (ARMOUR) 120 MG tablet Take 120 mg by mouth daily before breakfast.    [provider]  tiZANidine (ZANAFLEX) 4 MG tablet Take 4 mg by mouth 3 (three) times daily. 10/10/20   [provider]  topiramate (TOPAMAX) 25 MG tablet Take one tablet at night for one week, then take 2 tablets at night for one week, then take 3 tablets at night. 10/11/20   Kathrynn Ducking, MD  triamterene-hydrochlorothiazide (MAXZIDE-25) 37.5-25 MG tablet Take 1 tablet by mouth daily. 03/01/20   [provider]  verapamil (CALAN-SR) 120 MG CR tablet Take 120 mg by mouth daily. 09/12/20   [provider]  Vitamin D, Ergocalciferol, (DRISDOL) 1.25 MG (50000 UNIT) CAPS capsule Take 50,000 Units by mouth once a week. Tuesday 01/24/20   [provider]  lisinopril (ZESTRIL) 20 MG tablet Take 1 tablet (20 mg total) by mouth daily. 02/10/20 02/10/20  Milton Ferguson, MD     Allergies:     Allergies  Allergen Reactions   Food Nausea Only    Raw pulp in fruit- can eat cooked.   Other Nausea And Vomiting and Other (See Comments)    N/V - Enteric Coating   Adhesive [Tape] Other (See Comments)    SKIN IRRITATION   Bactrim Nausea And Vomiting and Other (See Comments)    Abdominal pain   Clarithromycin Nausea And Vomiting   Ibuprofen Nausea And Vomiting and Other (See Comments)    SEVERE STOMACH PAIN - CAN TAKE IV WITH NO ISSUES   Nexium [Esomeprazole] Other (See  Comments)    Cramps and headaches   Nsaids Other (See Comments)    AVOIDS DUE TO GERD   Peg 3350-Electrolytes Other (See Comments)    Aspiration    Sulfa Antibiotics Nausea And Vomiting and Other (See Comments)    ORAL MED.   (PER  , IV,  NO ISSUES)  Tisagenlecleucel Other (See Comments)    Unknown reaction    Tolmetin Other (See Comments)    AVOIDS DUE TO GERD   Trimethoprim Nausea And Vomiting    Other reaction(s): Nausea/Vomiting   Latex Rash and Other (See Comments)    SEVERE ITCHING     Physical Exam:   Vitals  Blood pressure (!) 134/100, pulse 92, temperature 98.7 F (37.1 C), temperature source Oral, resp. rate (!) 8, height '5\' 4"'$  (1.626 m), weight 101.6 kg, last menstrual period 02/04/2018, SpO2 94 %.   1. General -morbidly obese African-American female who is middle-aged lying in hospital bed in no distress,  2. Normal affect and insight, Not Suicidal or Homicidal, Awake Alert,   3. No F.N deficits, ALL C.Nerves Intact, Strength 5/5 all 4 extremities, Sensation intact all 4 extremities, Plantars down going.  4. Ears and Eyes appear Normal, Conjunctivae clear, PERRLA. Moist Oral Mucosa.  5. Supple Neck, No JVD, No cervical lymphadenopathy appriciated, No Carotid Bruits.  6. Symmetrical Chest wall movement, Good air movement bilaterally, CTAB.  7. RRR, No Gallops, Rubs or Murmurs, No Parasternal Heave.  8. Positive Bowel Sounds, Abdomen Soft, No tenderness, No organomegaly appriciated,No rebound -guarding or rigidity.  9.  No Cyanosis, Normal Skin Turgor, No Skin Rash or Bruise.  1+ leg edema.  10. Good muscle tone,  joints appear normal , no effusions, Normal ROM.  11. No Palpable Lymph Nodes in Neck or Axillae      Data Review:   Recent Labs  Lab 01/28/22 1045  WBC 11.2*  HGB 13.4  HCT 40.8  PLT 411*  MCV 87.7  MCH 28.8  MCHC 32.8  RDW 13.2    Recent Labs  Lab 01/28/22 1045 01/28/22 1127 01/28/22 1208  NA 139  --   --   K 3.3*  --   --    CL 100  --   --   CO2 31  --   --   GLUCOSE 121*  --   --   BUN 6  --   --   CREATININE 0.93  --   --   CALCIUM 9.6  --   --   AST 18  --   --   ALT 15  --   --   ALKPHOS 113  --   --   BILITOT 0.7  --   --   ALBUMIN 4.0  --   --   DDIMER  --  0.79*  --   BNP  --   --  6.5    Urinalysis    Component Value Date/Time   COLORURINE YELLOW 05/08/2021 2119   APPEARANCEUR CLEAR 05/08/2021 2119   LABSPEC 1.020 05/08/2021 2119   PHURINE 5.5 05/08/2021 2119   GLUCOSEU NEGATIVE 05/08/2021 2119   HGBUR TRACE (A) 05/08/2021 2119   BILIRUBINUR NEGATIVE 05/08/2021 2119   [place] NEGATIVE 05/08/2021 2119   PROTEINUR NEGATIVE 05/08/2021 2119   UROBILINOGEN 0.2 08/07/2014 2335   NITRITE NEGATIVE 05/08/2021 2119   LEUKOCYTESUR MODERATE (A) 05/08/2021 2119      Imaging Results:    DG Knee Complete 4 Views Right  Result Date: 01/28/2022 CLINICAL DATA:  Knee pain EXAM: RIGHT KNEE - COMPLETE 4+ VIEW COMPARISON:  None Available. FINDINGS: Normal alignment no fracture. Small joint effusion. Loose body in the joint posteriorly. Mild joint space narrowing and spurring medially. Moderate degenerative change in the patellofemoral joint with joint space narrowing and spurring. Lateral joint space intact. IMPRESSION: Degenerative change medially and in  the patellofemoral joint. Calcified loose body in the knee joint posteriorly. Electronically Signed   By: Franchot Gallo M.D.   On: 01/28/2022 12:04   DG Chest 2 View  Result Date: 01/28/2022 CLINICAL DATA:  Chest pain. EXAM: CHEST - 2 VIEW COMPARISON:  Chest x-ray 05/08/2021. FINDINGS: The heart size and mediastinal contours are within normal limits. Both lungs are clear. No visible pleural effusions or pneumothorax. No acute osseous abnormality. IMPRESSION: No evidence of acute cardiopulmonary disease. Electronically Signed   By: Margaretha Sheffield M.D.   On: 01/28/2022 10:53    My personal review of EKG: Rhythm NSR,  no Acute ST changes    Assessment & Plan:   Atypical substernal chest pressure, exertional shortness of breath and fatigue - she has atypical symptoms but have been going on for a few months and now gradually progressive, thankfully her initial EKG is nonacute and high-sensitivity troponin negative, she claims that she had a R.heart catheterization about 7 years ago by Dr. Einar Gip which was stable, also claims to have outside cardiac scan possibly Lexiscan which showed some blockages per patient.  She will be kept in 23-hour observation, control of blood pressure with appropriate medications which have been ordered, serial troponins, added Imdur to blood pressure medications increased there is some ischemic component to her symptoms.  Check echocardiogram, CTA has been ordered by ED physician to rule out PE which will be done.  Cardiology has been consulted.  Further work-up per cardiology.  2.  Essential hypertension in poor control.  Home medications resumed, added Imdur and hydralazine for better control, switch diuretic to 1 small dose of Lasix today as she has some fluid overload currently.  3.  Chronic pain and fibromyalgia.  Home medications continued.  As needed Narcan also added as she takes moderate to high doses of narcotics.  4.  Dyslipidemia.  Home dose statin.  5.  Morbid obesity with OSA.  Follow with PCP for weight loss, nighttime CPAP.  6.  Post thyroid resection hypothyroidism.  Home dose Synthroid continue check TSH.  7.  Hypokalemia.  Replaced.    8.  Minimally elevated D-dimer.  CTA ordered by EDP.  Follow results.    9. DM type II.  Check A1c, for now sliding scale.  Holding metformin as she is going to be exposed to IV dye for CTA.    DVT Prophylaxis   Lovenox   AM Labs Ordered, also please review Full Orders  Family Communication: Admission, patients condition and plan of care including tests being ordered have been discussed with the patient who indicates understanding and agree with the  plan and Code Status.  Code Status Full  Likely DC to  Home  Condition Fair  Consults called: Cards    Admission status: Obs    Time spent in minutes : 35   Lala Lund M.D on 01/28/2022 at 2:14 PM  To page go to [email or web address] - password [facility]

## 2022-01-28 NOTE — ED Provider Notes (Signed)
[facility] EMERGENCY DEPARTMENT Provider Note   CSN: [id number] Arrival date & time: 01/28/22  1002     History  Chief Complaint  Patient presents with   Excessive Sweating   Fatigue   Chest Pain   Knee Pain    Alexis [name] is a 56 y.o. female.  history of asthma, CHF, diabetes, endometriosis, fibromyalgia, GERD, hypertension, hypothyroidism, IBS, and vestibular dizziness presenting with episode of chest pain as well as diaphoresis this morning.  States over the past 2 to 3 days she has had episodes of diffuse sweating especially with exertion.  Symptoms last for several minutes at a time and resolved.  Episodes happen 2 or 3 times daily.  States today while she was getting up to go to the bathroom at 630 this morning she had some extreme fatigue, shortness of breath, left-sided chest pain with diaphoresis.  Symptoms resolved after several minutes.  She still feels fatigued and short of breath but has no chest pain or sweating currently.  Has a history of CHF and denies having any stents or CAD.  Denies any cough or fever. Has had worsening right knee pain over the past week after she felt a pop in it last week and is having pain with bending it.  No fevers, chills, nausea or vomiting. Uncertain when she last had a stress test.  Has a cardiologist at [facility]. She now states she had a stress test in April 2023 and was told the results were abnormal but nothing further was done about it.  Results not available  The history is provided by the patient.  Chest Pain Associated symptoms: diaphoresis and shortness of breath   Associated symptoms: no nausea and no vomiting   Knee Pain      Home Medications Prior to Admission medications   Medication Sig Start Date End Date Taking? Authorizing Provider  acetaminophen (TYLENOL) 500 MG tablet Take 1,000 mg by mouth every 6 (six) hours as needed for headache.    [provider]  albuterol (PROVENTIL  HFA;VENTOLIN HFA) 108 (90 BASE) MCG/ACT inhaler Inhale 2 puffs into the lungs every 4 (four) hours as needed for wheezing or shortness of breath.     [provider]  atorvastatin (LIPITOR) 20 MG tablet Take 20 mg by mouth daily.    [provider]  buprenorphine (BUTRANS) 10 MCG/HR [place] 1 patch onto the skin once a week.    [provider]  cetirizine (ZYRTEC) 10 MG chewable tablet Chew 10 mg by mouth daily.    [provider]  Diclofenac Sodium 3 % GEL Apply topically. 10/10/20   [provider]  dicyclomine (BENTYL) 20 MG tablet Take 1 tablet (20 mg total) by mouth 2 (two) times daily. 03/20/20   Marcello Fennel, PA-C  DULERA 200-5 MCG/ACT AERO Inhale 2 puffs into the lungs 2 (two) times daily. 05/03/17   [provider]  DULoxetine (CYMBALTA) 30 MG capsule Take 60 mg by mouth at bedtime. 01/24/20   [provider]  hydrOXYzine (ATARAX/VISTARIL) 50 MG tablet Take 50 mg by mouth 4 (four) times daily. 09/15/16   [provider]  levofloxacin (LEVAQUIN) 500 MG tablet Take 1 tablet (500 mg total) by mouth daily. 05/08/21   Roemhildt, Lorin T, PA-C  Lidocaine 0.5 % GEL Apply 1 application topically 3 (three) times daily as needed (pain).     [provider]  LYRICA 75 MG capsule Take 75 mg by mouth 2 (two) times  daily.  07/24/16   [provider]  meclizine (ANTIVERT) 25 MG tablet Take 1 tablet (25 mg total) by mouth 3 (three) times daily as needed for dizziness. 05/08/21   Roemhildt, Lorin T, PA-C  Melatonin 10 MG CAPS Take 10 mg by mouth at bedtime.     [provider]  metFORMIN (GLUCOPHAGE) 500 MG tablet Take 500 mg by mouth 2 (two) times daily with a meal.    [provider]  omeprazole (PRILOSEC) 40 MG capsule Take 40 mg by mouth daily.    [provider]  ondansetron (ZOFRAN-ODT) 8 MG disintegrating tablet Take 8 mg by mouth every 8 (eight) hours as needed for nausea or  vomiting.    [provider]  OVER THE COUNTER MEDICATION Apply 1 application topically 3 (three) times daily. CBD cream    [provider]  oxybutynin (DITROPAN XL) 15 MG 24 hr tablet Take 15 mg by mouth daily. 10/07/20   [provider]  oxycodone (ROXICODONE) 30 MG immediate release tablet Take 30 mg by mouth every 4 (four) hours as needed for pain.    [provider]  predniSONE (DELTASONE) 20 MG tablet 3 Tabs PO Days 1-3, then 2 tabs PO Days 4-6, then 1 tab PO Day 7-9, then Half Tab PO Day 10-12 06/23/20   Carlisle Cater, PA-C  promethazine (PHENERGAN) 12.5 MG suppository Place 1 suppository (12.5 mg total) rectally every 6 (six) hours as needed for up to 12 days for nausea or vomiting. 03/20/20 04/01/20  Marcello Fennel, PA-C  promethazine (PHENERGAN) 25 MG tablet Take 25 mg by mouth every 6 (six) hours as needed for nausea or vomiting.    [provider]  SYMBICORT 160-4.5 MCG/ACT inhaler Inhale into the lungs. 11/06/20   [provider]  thyroid (ARMOUR) 120 MG tablet Take 120 mg by mouth daily before breakfast.    [provider]  tiZANidine (ZANAFLEX) 4 MG tablet Take 4 mg by mouth 3 (three) times daily. 10/10/20   [provider]  topiramate (TOPAMAX) 25 MG tablet Take one tablet at night for one week, then take 2 tablets at night for one week, then take 3 tablets at night. 10/11/20   Kathrynn Ducking, MD  triamterene-hydrochlorothiazide (MAXZIDE-25) 37.5-25 MG tablet Take 1 tablet by mouth daily. 03/01/20   [provider]  verapamil (CALAN-SR) 120 MG CR tablet Take 120 mg by mouth daily. 09/12/20   [provider]  Vitamin D, Ergocalciferol, (DRISDOL) 1.25 MG (50000 UNIT) CAPS capsule Take 50,000 Units by mouth once a week. Tuesday 01/24/20   [provider]  lisinopril (ZESTRIL) 20 MG tablet Take 1 tablet (20 mg total) by mouth daily. 02/10/20 02/10/20  Milton Ferguson, MD      Allergies     Food, Other, Adhesive [tape], Bactrim, Clarithromycin, Ibuprofen, Nexium [esomeprazole], Nsaids, Peg 3350-electrolytes, Sulfa antibiotics, Tisagenlecleucel, Tolmetin, Trimethoprim, and Latex    Review of Systems   Review of Systems  Constitutional:  Positive for diaphoresis.  HENT:  Negative for congestion and postnasal drip.   Respiratory:  Positive for chest tightness and shortness of breath.   Cardiovascular:  Positive for chest pain.  Gastrointestinal:  Negative for nausea and vomiting.  Genitourinary:  Negative for dysuria and hematuria.  Musculoskeletal:  Positive for arthralgias and myalgias.   all other systems are negative except as noted in the HPI and PMH.    Physical Exam Updated Vital Signs BP 130/89   Pulse (!) 106  Temp 98.7 F (37.1 C) (Oral)   Resp (!) 26   Ht '5\' 4"'$  (1.626 m)   Wt 101.6 kg   LMP 02/04/2018   SpO2 97%   BMI 38.45 kg/m  Physical Exam Vitals and nursing note reviewed.  Constitutional:      General: She is not in acute distress.    Appearance: She is well-developed.  HENT:     Head: Normocephalic and atraumatic.     Mouth/Throat:     Pharynx: No oropharyngeal exudate.  Eyes:     Conjunctiva/sclera: Conjunctivae normal.     Pupils: Pupils are equal, round, and reactive to light.  Neck:     Comments: No meningismus. Cardiovascular:     Rate and Rhythm: Normal rate and regular rhythm.     Heart sounds: Normal heart sounds. No murmur heard. Pulmonary:     Effort: Pulmonary effort is normal. No respiratory distress.     Breath sounds: Normal breath sounds.  Abdominal:     Palpations: Abdomen is soft.     Tenderness: There is no abdominal tenderness. There is no guarding or rebound.  Musculoskeletal:        General: Tenderness present. Normal range of motion.     Cervical back: Normal range of motion and neck supple.     Right lower leg: Edema present.     Left lower leg: Edema present.     Comments: Reduced range of motion of right  knee.  No significant effusion.  Flexion extension are intact.  No warmth or erythema.  Skin:    General: Skin is warm.  Neurological:     Mental Status: She is alert and oriented to person, place, and time.     Cranial Nerves: No cranial nerve deficit.     Motor: No abnormal muscle tone.     Coordination: Coordination normal.     Comments:  5/5 strength throughout. CN 2-12 intact.Equal grip strength.   Psychiatric:        Behavior: Behavior normal.     ED Results / Procedures / Treatments   Labs (all labs ordered are listed, but only abnormal results are displayed) Labs Reviewed  BASIC METABOLIC PANEL - Abnormal; Notable for the following components:      Result Value   Potassium 3.3 (*)    Glucose, Bld 121 (*)    All other components within normal limits  CBC - Abnormal; Notable for the following components:   WBC 11.2 (*)    Platelets 411 (*)    All other components within normal limits  D-DIMER, QUANTITATIVE - Abnormal; Notable for the following components:   D-Dimer, Quant 0.79 (*)    All other components within normal limits  BRAIN NATRIURETIC PEPTIDE  HEPATIC FUNCTION PANEL  TSH  URINALYSIS, ROUTINE W REFLEX MICROSCOPIC  HEMOGLOBIN A1C  HIV ANTIBODY (ROUTINE TESTING W REFLEX)  TROPONIN I (HIGH SENSITIVITY)  TROPONIN I (HIGH SENSITIVITY)  TROPONIN I (HIGH SENSITIVITY)    EKG None  Radiology DG Chest 2 View  Result Date: 01/28/2022 CLINICAL DATA:  Chest pain. EXAM: CHEST - 2 VIEW COMPARISON:  Chest x-ray 05/08/2021. FINDINGS: The heart size and mediastinal contours are within normal limits. Both lungs are clear. No visible pleural effusions or pneumothorax. No acute osseous abnormality. IMPRESSION: No evidence of acute cardiopulmonary disease. Electronically Signed   By: Margaretha Sheffield M.D.   On: 01/28/2022 10:53    Procedures Procedures    Medications Ordered in ED Medications - No data to display  ED Course/ Medical Decision Making/ A&P                            Medical Decision Making Amount and/or Complexity of Data Reviewed Labs: ordered. Decision-making details documented in ED Course. Radiology: ordered and independent interpretation performed. Decision-making details documented in ED Course. ECG/medicine tests: ordered and independent interpretation performed. Decision-making details documented in ED Course.  Risk OTC drugs. Decision regarding hospitalization.   3 days of intermittent diaphoresis associated with chest pain, fatigue, shortness of breath.  EKG on arrival shows T wave inversions laterally which are similar to previous. They were not present in December 2022 but were present in November.  No current chest pain or sweating.  Nonspecific EKG changes as above.  Troponin negative x1.  D-dimer is positive.  Discussed cardiology team who recommends medical admission. They will consult.   No episodes of chest pain throughout ED course.  Given abnormal EKG with report of diaphoresis, dyspnea on exertion and intermittent chest pain will plan for cardiology evaluation and medical admission.  D-dimer mildly elevated and will obtain CT angiogram to rule out pulmonary embolism given her dyspnea on exertion.  CT scan pending at time of admission  D/w Dr. Candiss Norse.       Final Clinical Impression(s) / ED Diagnoses Final diagnoses:  None    Rx / DC Orders ED Discharge Orders     None         Krisy Dix, Annie Main, MD 01/28/22 1606

## 2022-01-28 NOTE — Consult Note (Signed)
Cardiology Consultation   Patient ID: KALESE ENSZ MRN: [id number]; DOB: 1966/05/11  Admit date: 01/28/2022 Date of Consult: 01/28/2022  PCP:  Benito Mccreedy, MD   [place] [facility] Cardiologist: [facility]  (previously Dr. Einar Gip)  Patient Profile:   Alexis [name] is a 56 y.o. female with a hx of hypertension, diabetes mellitus, hyperlipidemia, ASD defect, and morbid obesity who is being seen 01/28/2022 for the evaluation of dyspnea on exertion at the request of Dr. Candiss Norse.  Previously seen by Dr. Einar Gip in 2017 for dyspnea on exertion and intermittent chest pain.  Stress test without evidence of ischemia.  Echocardiogram with normal LV function.  Mild RV dilatation and probable ASD.  Underwent transesophageal echocardiogram with Inter atrial septum shows a very tiny fenestrated ASD with left to right shunting only by color Doppler, Double contrast study negative for atrial level shunting. No late appearance of bubbles either. This suggests there is no significant ASD. [place] 10/28/15: Normal right heart catheterizaton with preserved cardiac output and cardiac index. No suggestion of intracardiac shunting with normal Qp:Qs ratio. Last seen by Dr. Einar Gip 10/2015.   Echocardiogram 01/2021 showed normal BiV function without significant valvular abnormality. RVSP 21.31m Hg.   [facility]  She has chronic dyspnea on exertion, recently worsened.  She had a stress test in April 2023 which showed "blockages".  Monitor without arrhythmia.  She never had a cardiac catheterization done.  She was scheduled for right rotator cuff surgery but it was canceled due to abnormal EKG few weeks ago.  History of Present Illness:   Alexis [name] chronic dyspnea on exertion for years which has worsened.  Now has intermittent episode of severe "diaphoresis".  This always occurs with exertion.  Also reporting palpitation and chest  pain.  Symptoms can be to greater or separate.  She is worried about her diaphoresis episode.  This now occurs every day.  Intensity and duration has worsened.  Due to ongoing symptoms she came to ER for further evaluation.  She does not work.  [place]  Mostly sedentary lifestyle.  Also diagnosed with sleep apnea but not on CPAP.  Reports on waiting list.  Troponin negative BNP 6.5 Potassium 3.3 Chest x-ray without acute cardiopulmonary disease D-dimer 0.79   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[place]    "left"/CT (05/17/2017)   Anemia    ASD (atrial septal defect)    per pt   Asthma    Bilateral dry eyes    Cervical spondylosis    C5-6   CHF (congestive heart failure) (HCC)    not sure   Chronic back pain    Chronic bronchitis (HCC)    Chronic colitis    Chronic lower GI bleeding    Complication of anesthesia    has awakened in past during surgery/  LEFT VOCAL CORD PARALYSIS  POST TOTAL THYROIDECTOMY  03-08-2009 ([facility])   Degenerative disk disease    "cervical to sacrum" (05/17/2017)   Depression    Diabetes mellitus without complication (HCC)    Diabetic neuropathy, type II diabetes mellitus ([facility] 12/14/2014   not on medication   DJD (degenerative joint disease) of knee    Dyspnea    Endometriosis 01/18/01   Family history of adverse reaction to anesthesia    "mom wakes up during procedures"   Fatty liver    Fibromyalgia    Gastric  ulcer    GERD (gastroesophageal reflux disease)    Glaucoma of both eyes    H/O hiatal hernia    High cholesterol    History of colon polyps    Hypertension    Hypothyroidism, postsurgical    IBS (irritable bowel syndrome)    IC (interstitial cystitis)    Interstitial cystitis    Lymphedema    Migraine    "monthly" (05/17/2017)  02/06/2019- more frequently   OSA on CPAP    Osteoarthritis    "all over; mainly knees and back" (05/17/2017)   PONV (postoperative nausea and vomiting)    PTSD  (post-traumatic stress disorder)    PTSD (post-traumatic stress disorder)    Sickle cell trait (HCC)    Sjogren's syndrome (HCC)    Thyroid cancer ([place])    Tremor 12/14/2014   hand   Uterine fibroid    Varicose veins    Vestibular dizziness    Vocal cord paralysis, unilateral complete    "left";  POST TOTAL THYROIDECTOMY    Past Surgical History:  Procedure Laterality Date   ACHILLES TENDON REPAIR Left 2007   BUNIONECTOMY WITH HAMMERTOE RECONSTRUCTION AND GASTROC SLIDE Left FEB  2011   REMOVAL HARDWARE  NOV  2011   CARDIAC CATHETERIZATION N/A 10/28/2015   Procedure: Right Heart Cath;  Surgeon: Adrian Prows, MD;  Location: [facility];  Service: Cardiovascular;  Laterality: N/A;   COLONOSCOPY WITH ESOPHAGOGASTRODUODENOSCOPY (EGD)  MULTIPLE --  LAST ONE   NOV  2014   CYSTO/  BILATERAL RETROGRADE PYELOGRAM/  HYDRODISTENTION/  INSTILLATION THERAPY  05-08-2003   CYSTO/  HYDRODISTENTION/  INSTILLATION THERAPY  06-04-2005  &  03-04-2007   CYSTO/  URETHRAL DILATION /  BILATERAL UNROOFING URETEROCELE/  BILATERAL URETERAL STENT PLACEMENT  12-01-2002   D & C HYSTEROSCOPY/  REMOVAL IUD  12-06-2003   DE QUERVAIN'S RELEASE Left ?09/2005   DIAGNOSTIC LAPAROSCOPY     DILATATION AND CURETTAGE WITH SUCTION  05-29-2005   RETAINED PLACENTA   DILATION AND CURETTAGE OF UTERUS     DX LAPAROSCOPY/  PELVIC BX'S/  LYSIS ADHESIONS  06-26-2000   FOOT HARDWARE REMOVAL Left 03/2010   REMOVAL HARDWARE "related to earlier hammertoe OR"   HYDRADENITIS EXCISION  06/29/2011   Procedure: EXCISION HYDRADENITIS AXILLA;  Surgeon: Hermelinda Dellen, MD;  Location: [facility];  Service: Plastics;;  right breast hydradenitis excioion   HYDRADENITIS EXCISION Left 2000   INCISION AND DRAINAGE ABSCESS ANAL  02-14-2001   KNEE ARTHROSCOPY WITH LATERAL MENISECTOMY Right 09/08/2013   Procedure: RIGHT KNEE ARTHROSCOPY PARTIAL LATERAL MENISCECTOMY AND DEBRIDEMENT ;  Surgeon: Johnn Hai, MD;  Location:  [place];  Service: Orthopedics;  Laterality: Right;   LAPAROSCOPY ABDOMEN DIAGNOSTIC  SEPT  2003   [place]   AND PLACEMENT IUD   MASS EXCISION  10/29/2003   WIDE EXCISION CHEST AREA   PILONIDAL CYST EXCISION  1983   RADIOLOGY WITH ANESTHESIA N/A 02/07/2019   Procedure: MRI BRAIN WITHOUT IV CONTRAST; MRI CERVICAL, THORACIC, AND LUMBAR SPINE WITH AND WITHOUT CONTRAST;  Surgeon: Radiologist, Medication, MD;  Location: [facility];  Service: Radiology;  Laterality: N/A;   RADIOLOGY WITH ANESTHESIA N/A 02/15/2020   Procedure: MRI LUMBAR WITHOUT CONTRAST;  Surgeon: Radiologist, Medication, MD;  Location: [place];  Service: Radiology;  Laterality: N/A;   REDUCTION MAMMAPLASTY  1997   TEE WITHOUT CARDIOVERSION N/A 09/26/2015   Procedure: TRANSESOPHAGEAL ECHOCARDIOGRAM (TEE);  Surgeon: Adrian Prows, MD;  Location: [place];  Service: Cardiovascular;  Laterality: N/A;   TEMPORAL ARTERY BIOPSY / LIGATION Left 2010   TEMPORAL ARTERY BIOPSY / LIGATION Right 06-16-2002   TONSILLECTOMY  1990   TOTAL THYROIDECTOMY  11/06/2008     Inpatient Medications: Scheduled Meds:  alum & mag hydroxide-simeth  30 mL Oral Once   atorvastatin  20 mg Oral Daily   DULoxetine  60 mg Oral QHS   enoxaparin (LOVENOX) injection  40 mg Subcutaneous Q24H   furosemide  40 mg Oral Once   hydrALAZINE  50 mg Oral Q8H   insulin aspart  0-5 Units Subcutaneous QHS   insulin aspart  0-9 Units Subcutaneous TID WC   [START ON 01/29/2022] isosorbide mononitrate  60 mg Oral Daily   mometasone-formoterol  2 puff Inhalation BID   oxybutynin  15 mg Oral Daily   pantoprazole  40 mg Oral Daily   potassium chloride  40 mEq Oral Once   pregabalin  75 mg Oral BID   [START ON 01/29/2022] thyroid  120 mg Oral QAC breakfast   topiramate  50 mg Oral Daily   verapamil  120 mg Oral Daily   Continuous Infusions:  PRN Meds: acetaminophen, hydrALAZINE, naLOXone (NARCAN)  injection, ondansetron (ZOFRAN) IV, oxycodone,  promethazine  Allergies:    Allergies  Allergen Reactions   Food Nausea And Vomiting    Raw pulp in fruit- can eat cooked.   Other Nausea And Vomiting and Other (See Comments)    N/V - Enteric Coating   Adhesive [Tape] Other (See Comments)    Skin irritation   Bactrim Nausea And Vomiting and Other (See Comments)    Abdominal pain   Clarithromycin Nausea And Vomiting   Ibuprofen Nausea And Vomiting and Other (See Comments)    Severe stomach pain- can take IV with no issues   Meclizine Other (See Comments)    Hallucinations, dizziness   Metformin Hcl Diarrhea and Nausea And Vomiting   Nexium [Esomeprazole] Other (See Comments)    Cramps and headaches   Nsaids Other (See Comments)    Avoids due to GERD   Peg 3350-Electrolytes Other (See Comments)    Aspiration    Sulfa Antibiotics Nausea And Vomiting and Other (See Comments)    No issue with IV    Tisagenlecleucel Other (See Comments)    Unknown reaction    Tolmetin Other (See Comments)    Avoid due to GERD   Topiramate Other (See Comments)    Dizziness   Trimethoprim Nausea And Vomiting   Latex Itching and Rash    Social History:   Social History   Socioeconomic History   Marital status: Single    Spouse name: Not on file   Number of children: 2   Years of education: 14   Highest education level: Not on file  Occupational History   Occupation: Disabled    Employer: UNEMPLOYED  Tobacco Use   Smoking status: Never   Smokeless tobacco: Never  Vaping Use   Vaping Use: Never used  Substance and Sexual Activity   Alcohol use: Not Currently    Comment: rare   Drug use: No   Sexual activity: Not Currently  Other Topics Concern   Not on file  Social History Narrative   Homeless since March 2021   Patient drinks caffeine occasionally.   Patient is right handed.   Social Determinants of Health   Financial Resource Strain: Not on file  Food Insecurity: Not on file  Transportation Needs: Not on file  Physical  Activity: Not on file  Stress: Not on file  Social Connections: Not on file  Intimate Partner Violence: Not on file    Family History:   Family History  Problem Relation Age of Onset   Atrial fibrillation Mother    Diabetes Mother    Thyroid disease Mother    Cushing syndrome Mother    Tremor Mother    Atrial fibrillation Father    Glaucoma Father    Tremor Sister    Cancer Paternal Aunt        breast, colon     ROS:  Please see the history of present illness. All other ROS reviewed and negative.     Physical Exam/Data:   Vitals:   01/28/22 1230 01/28/22 1245 01/28/22 1300 01/28/22 1315  BP: (!) 126/99 132/85 (!) 135/96 (!) 134/100  Pulse: 90 92 92 92  Resp: 18 (!) 9 12 (!) 8  Temp:      TempSrc:      SpO2: 94% 98% (!) 87% 94%  Weight:      Height:       No intake or output data in the 24 hours ending 01/28/22 1552    01/28/2022   10:28 AM 05/08/2021    7:20 PM 04/09/2021    1:37 PM  Last 3 Weights  Weight (lbs) 224 lb 216 lb 216 lb  Weight (kg) 101.606 kg 97.977 kg 97.977 kg     Body mass index is 38.45 kg/m.  General:  Well nourished, well developed, in no acute distress HEENT: normal Neck: no JVD Vascular: No carotid bruits; Distal pulses 2+ bilaterally Cardiac:  normal S1, S2; RRR; no murmur  Lungs:  clear to auscultation bilaterally, no wheezing, rhonchi or rales  Abd: soft, nontender, no hepatomegaly  Ext: no edema Musculoskeletal:  No deformities, BUE and BLE strength normal and equal Skin: warm and dry  Neuro:  CNs 2-12 intact, no focal abnormalities noted Psych:  Normal affect   EKG:  The EKG was personally reviewed and demonstrates:  Sinus rhythm, HR 98, non specific T wave changes with early repol  Telemetry:  Telemetry was personally reviewed and demonstrates:  sinus rhythm   Relevant CV Studies:  Echo 01/2021 1. Left ventricular ejection fraction, by estimation, is 60 to 65%. The  left ventricle has normal function. The left ventricle  has no regional  wall motion abnormalities. Left ventricular diastolic parameters were  normal.   2. Right ventricular systolic function is normal. The right ventricular  size is normal. Tricuspid regurgitation signal is inadequate for assessing  PA pressure. The estimated right ventricular systolic pressure is 54.2  mmHg.   3. The mitral valve is grossly normal. No evidence of mitral valve  regurgitation. No evidence of mitral stenosis.   4. The aortic valve is tricuspid. Aortic valve regurgitation is not  visualized. No aortic stenosis is present.   5. The inferior vena cava is normal in size with greater than 50%  respiratory variability, suggesting right atrial pressure of 3 mmHg.   Conclusion(s)/Recommendation(s): Normal biventricular function without  evidence of hemodynamically significant valvular heart disease.   RHC 10/28/15 RA 7/4, mean 4 mmHg. RV 70/6, end-diastolic pressure 6 to limit her mercury. PA 19/9, the 14 mmHg. PA saturation 78%. Pulmonary Artery wedge 11/9, mean 8 mmHg. Aortic saturation 100%. Cardiac output 7.11, cardiac index 3.65 by Fick. QT/QS 1.0.   Impression:   Normal right heart catheterizaton with preserved cardiac output and cardiac index. No suggestion of intracardiac shunting  with normal Qp:Qs ratio.   Recommendation: I discussed the findings of the  catheterization with patient.   TEE 09/2015 Study Conclusions   - Left ventricle: Systolic function was normal. Wall motion was    normal; there were no regional wall motion abnormalities.  - Left atrium: No evidence of thrombus in the atrial cavity or    appendage.  - Right atrium: No evidence of thrombus in the atrial cavity or    appendage.  - Atrial septum: There was a possible, small fenestrated pinhole    ASD by color Doppler only There was no atrial level shunt.  - Impressions: Normal study.   Impressions:   - Normal study. Normal pulmonary artery pressure.   Laboratory Data:  High  Sensitivity Troponin:   Recent Labs  Lab 01/28/22 1045  TROPONINIHS 5     Chemistry Recent Labs  Lab 01/28/22 1045  NA 139  K 3.3*  CL 100  CO2 31  GLUCOSE 121*  BUN 6  CREATININE 0.93  CALCIUM 9.6  GFRNONAA >60  ANIONGAP 8    Recent Labs  Lab 01/28/22 1045  PROT 7.8  ALBUMIN 4.0  AST 18  ALT 15  ALKPHOS 113  BILITOT 0.7   Lipids No results for input(s): "CHOL", "TRIG", "HDL", "LABVLDL", "LDLCALC", "CHOLHDL" in the last 168 hours.  Hematology Recent Labs  Lab 01/28/22 1045  WBC 11.2*  RBC 4.65  HGB 13.4  HCT 40.8  MCV 87.7  MCH 28.8  MCHC 32.8  RDW 13.2  PLT 411*   Thyroid  Recent Labs  Lab 01/28/22 1045  TSH 2.323    BNP Recent Labs  Lab 01/28/22 1208  BNP 6.5    DDimer  Recent Labs  Lab 01/28/22 1127  DDIMER 0.79*     Radiology/Studies:  DG Knee Complete 4 Views Right  Result Date: 01/28/2022 CLINICAL DATA:  Knee pain EXAM: RIGHT KNEE - COMPLETE 4+ VIEW COMPARISON:  None Available. FINDINGS: Normal alignment no fracture. Small joint effusion. Loose body in the joint posteriorly. Mild joint space narrowing and spurring medially. Moderate degenerative change in the patellofemoral joint with joint space narrowing and spurring. Lateral joint space intact. IMPRESSION: Degenerative change medially and in the patellofemoral joint. Calcified loose body in the knee joint posteriorly. Electronically Signed   By: Franchot Gallo M.D.   On: 01/28/2022 12:04   DG Chest 2 View  Result Date: 01/28/2022 CLINICAL DATA:  Chest pain. EXAM: CHEST - 2 VIEW COMPARISON:  Chest x-ray 05/08/2021. FINDINGS: The heart size and mediastinal contours are within normal limits. Both lungs are clear. No visible pleural effusions or pneumothorax. No acute osseous abnormality. IMPRESSION: No evidence of acute cardiopulmonary disease. Electronically Signed   By: Margaretha Sheffield M.D.   On: 01/28/2022 10:53     Assessment and Plan:   Dyspnea on exertion Diaphoretic  episode Abnormal EKG Chest pain  Palpitations -Patient chronic dyspnea on exertion with extensive work-up by Dr. Einar Gip as above.  Since then followed at [facility].  Reportedly having abnormal stress test but no work-up done afterwards.  Patient has chronic abnormal EKG with repolarization/T wave abnormality.  She lives sedentary lifestyle.  Lives at [facility]. -Now worsening dyspnea on exertion with episodic diaphoretic episode with and without chest pain and palpitations.  -Troponin normal -BNP normal  -D-dimer minimally elevated.  Given sedentary lifestyle and symptoms, consider PE rule out. Pending CTA of chest.  -Agree with repeat echocardiogram -We will plan to get stress test from her  primary cardiologist  4. Knee/shoulder pain - Per primary   5. HTN - BP elevated intermittently.  - Monitor on current medications  6. OSA - Reports waiting to get CPAP  7. DM - Per primary   Dr. Radford Pax to see    For questions or updates, please contact [place] Please consult [email or web address] for contact info under    Jarrett Soho, PA  01/28/2022 3:52 PM

## 2022-01-28 NOTE — ED Notes (Signed)
ED TO INPATIENT HANDOFF REPORT  ED Nurse Name and Phone #: Dresden Ament RN [phone]  S Name/Age/Gender Alexis [name] 56 y.o. female Room/Bed: 042C/042C  Code Status   Code Status: Full Code  Home/SNF/Other Home Patient oriented to: self, place, time, and situation Is this baseline? Yes   Triage Complete: Triage complete  Chief Complaint Chest pain [R07.9]  Triage Note Pt. Stated, Donnald Garre had a lot of sweating that starts for no reason. Ive had 3 episodes of sweating a lot. Im feeling a lot of tired . Ive alot had a little chest pain but not much. My right knee started hurting and swelling since last Wednesday.    Allergies Allergies  Allergen Reactions   Food Nausea And Vomiting    Raw pulp in fruit- can eat cooked.   Other Nausea And Vomiting and Other (See Comments)    N/V - Enteric Coating   Adhesive [Tape] Other (See Comments)    Skin irritation   Bactrim Nausea And Vomiting and Other (See Comments)    Abdominal pain   Clarithromycin Nausea And Vomiting   Ibuprofen Nausea And Vomiting and Other (See Comments)    Severe stomach pain- can take IV with no issues   Meclizine Other (See Comments)    Hallucinations, dizziness   Metformin Hcl Diarrhea and Nausea And Vomiting   Nexium [Esomeprazole] Other (See Comments)    Cramps and headaches   Nsaids Other (See Comments)    Avoids due to GERD   Peg 3350-Electrolytes Other (See Comments)    Aspiration    Sulfa Antibiotics Nausea And Vomiting and Other (See Comments)    No issue with IV    Tisagenlecleucel Other (See Comments)    Unknown reaction    Tolmetin Other (See Comments)    Avoid due to GERD   Topiramate Other (See Comments)    Dizziness   Trimethoprim Nausea And Vomiting   Latex Itching and Rash    Level of Care/Admitting Diagnosis ED Disposition     ED Disposition  Admit   Condition  --   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[place])    "left"/CT (05/17/2017)   Anemia    ASD (atrial septal defect)    per pt   Asthma    Bilateral dry eyes    Cervical spondylosis    C5-6   CHF (congestive heart failure) (HCC)    not sure   Chronic back pain    Chronic bronchitis (HCC)    Chronic colitis    Chronic lower GI bleeding    Complication of anesthesia    has awakened in past during surgery/  LEFT VOCAL CORD PARALYSIS  POST TOTAL THYROIDECTOMY  03-08-2009 ([facility])   Degenerative disk disease    "cervical to sacrum" (05/17/2017)   Depression    Diabetes mellitus without complication (HCC)    Diabetic neuropathy, type II diabetes mellitus ([place]) 12/14/2014   not on medication   DJD (degenerative joint disease) of knee  Dyspnea    Endometriosis 01/18/01   Family history of adverse reaction to anesthesia    "mom wakes up during procedures"   Fatty liver    Fibromyalgia    Gastric ulcer    GERD (gastroesophageal reflux disease)    Glaucoma of both eyes    H/O hiatal hernia    High cholesterol    History of colon polyps    Hypertension    Hypothyroidism, postsurgical    IBS (irritable bowel syndrome)    IC (interstitial cystitis)    Interstitial cystitis    Lymphedema    Migraine    "monthly" (05/17/2017)  02/06/2019- more frequently   OSA on CPAP    Osteoarthritis    "all over; mainly knees and back" (05/17/2017)   PONV (postoperative nausea and vomiting)    PTSD (post-traumatic stress disorder)    PTSD (post-traumatic stress disorder)    Sickle cell trait (HCC)    Sjogren's syndrome  ([place])    Thyroid cancer ([place])    Tremor 12/14/2014   hand   Uterine fibroid    Varicose veins    Vestibular dizziness    Vocal cord paralysis, unilateral complete    "left";  POST TOTAL THYROIDECTOMY   Past Surgical History:  Procedure Laterality Date   ACHILLES TENDON REPAIR Left 2007   BUNIONECTOMY WITH HAMMERTOE RECONSTRUCTION AND GASTROC SLIDE Left FEB  2011   REMOVAL HARDWARE  NOV  2011   CARDIAC CATHETERIZATION N/A 10/28/2015   Procedure: Right Heart Cath;  Surgeon: Adrian Prows, MD;  Location: [facility];  Service: Cardiovascular;  Laterality: N/A;   COLONOSCOPY WITH ESOPHAGOGASTRODUODENOSCOPY (EGD)  MULTIPLE --  LAST ONE   NOV  2014   CYSTO/  BILATERAL RETROGRADE PYELOGRAM/  HYDRODISTENTION/  INSTILLATION THERAPY  05-08-2003   CYSTO/  HYDRODISTENTION/  INSTILLATION THERAPY  06-04-2005  &  03-04-2007   CYSTO/  URETHRAL DILATION /  BILATERAL UNROOFING URETEROCELE/  BILATERAL URETERAL STENT PLACEMENT  12-01-2002   D & C HYSTEROSCOPY/  REMOVAL IUD  12-06-2003   DE QUERVAIN'S RELEASE Left ?09/2005   DIAGNOSTIC LAPAROSCOPY     DILATATION AND CURETTAGE WITH SUCTION  05-29-2005   RETAINED PLACENTA   DILATION AND CURETTAGE OF UTERUS     DX LAPAROSCOPY/  PELVIC BX'S/  LYSIS ADHESIONS  06-26-2000   FOOT HARDWARE REMOVAL Left 03/2010   REMOVAL HARDWARE "related to earlier hammertoe OR"   HYDRADENITIS EXCISION  06/29/2011   Procedure: EXCISION HYDRADENITIS AXILLA;  Surgeon: Hermelinda Dellen, MD;  Location: [facility];  Service: Plastics;;  right breast hydradenitis excioion   HYDRADENITIS EXCISION Left 2000   INCISION AND DRAINAGE ABSCESS ANAL  02-14-2001   KNEE ARTHROSCOPY WITH LATERAL MENISECTOMY Right 09/08/2013   Procedure: RIGHT KNEE ARTHROSCOPY PARTIAL LATERAL MENISCECTOMY AND DEBRIDEMENT ;  Surgeon: Johnn Hai, MD;  Location: [facility];  Service: Orthopedics;  Laterality: Right;   LAPAROSCOPY ABDOMEN DIAGNOSTIC  SEPT  2003   [place]    AND PLACEMENT IUD   MASS EXCISION  10/29/2003   WIDE EXCISION CHEST AREA   PILONIDAL CYST EXCISION  1983   RADIOLOGY WITH ANESTHESIA N/A 02/07/2019   Procedure: MRI BRAIN WITHOUT IV CONTRAST; MRI CERVICAL, THORACIC, AND LUMBAR SPINE WITH AND WITHOUT CONTRAST;  Surgeon: Radiologist, Medication, MD;  Location: [facility];  Service: Radiology;  Laterality: N/A;   RADIOLOGY WITH ANESTHESIA N/A 02/15/2020   Procedure: MRI LUMBAR WITHOUT CONTRAST;  Surgeon: Radiologist, Medication, MD;  Location: [facility];  Service: Radiology;  Laterality: N/A;   REDUCTION MAMMAPLASTY  1997   TEE WITHOUT CARDIOVERSION N/A 09/26/2015   Procedure: TRANSESOPHAGEAL ECHOCARDIOGRAM (TEE);  Surgeon: Adrian Prows, MD;  Location: [facility];  Service: Cardiovascular;  Laterality: N/A;   TEMPORAL ARTERY BIOPSY / LIGATION Left 2010   TEMPORAL ARTERY BIOPSY / LIGATION Right 06-16-2002   TONSILLECTOMY  1990   TOTAL THYROIDECTOMY  11/06/2008     A IV Location/Drains/Wounds Patient Lines/Drains/Airways Status     Active Line/Drains/Airways     Name Placement date Placement time Site Days   Peripheral IV 01/28/22 20 G Left Antecubital 01/28/22  1400  Antecubital  less than 1   Incision 06/29/11 Breast Right 06/29/11  1124  -- 3866   Incision (Closed) 09/08/13 Knee Right 09/08/13  1316  -- 3064            Intake/Output Last 24 hours No intake or output data in the 24 hours ending 01/28/22 2119  Labs/Imaging Results for orders placed or performed during the hospital encounter of 01/28/22 (from the past 48 hour(s))  Basic metabolic panel     Status: Abnormal   Collection Time: 01/28/22 10:45 AM  Result Value Ref Range   Sodium 139 135 - 145 mmol/L   Potassium 3.3 (L) 3.5 - 5.1 mmol/L   Chloride 100 98 - 111 mmol/L   CO2 31 22 - 32 mmol/L   Glucose, Bld 121 (H) 70 - 99 mg/dL    Comment: Glucose reference range applies only to samples taken after fasting for at least 8 hours.   BUN 6 6 - 20 mg/dL   Creatinine, Ser 0.93  0.44 - 1.00 mg/dL   Calcium 9.6 8.9 - 10.3 mg/dL   GFR, Estimated >60 >60 mL/min    Comment: (NOTE) Calculated using the CKD-EPI Creatinine Equation (2021)    Anion gap 8 5 - 15    Comment: Performed at [facility] [address] [place], [place] [address]  CBC     Status: Abnormal   Collection Time: 01/28/22 10:45 AM  Result Value Ref Range   WBC 11.2 (H) 4.0 - 10.5 K/uL   RBC 4.65 3.87 - 5.11 MIL/uL   Hemoglobin 13.4 12.0 - 15.0 g/dL   HCT 40.8 36.0 - 46.0 %   MCV 87.7 80.0 - 100.0 fL   MCH 28.8 26.0 - 34.0 pg   MCHC 32.8 30.0 - 36.0 g/dL   RDW 13.2 11.5 - 15.5 %   Platelets 411 (H) 150 - 400 K/uL   nRBC 0.0 0.0 - 0.2 %    Comment: Performed at [facility], [place] [address] [place], [place] [address]  Hepatic function panel     Status: None   Collection Time: 01/28/22 10:45 AM  Result Value Ref Range   Total Protein 7.8 6.5 - 8.1 g/dL   Albumin 4.0 3.5 - 5.0 g/dL   AST 18 15 - 41 U/L   ALT 15 0 - 44 U/L   Alkaline Phosphatase 113 38 - 126 U/L   Total Bilirubin 0.7 0.3 - 1.2 mg/dL   Bilirubin, Direct <0.1 0.0 - 0.2 mg/dL   Indirect Bilirubin NOT CALCULATED 0.3 - 0.9 mg/dL    Comment: Performed at [facility] [address], [place], [place] [address]  Troponin I (High Sensitivity)     Status: None   Collection Time: 01/28/22 10:45 AM  Result Value Ref Range   Troponin I (High Sensitivity) 5 <18 ng/L  Comment: (NOTE) Elevated high sensitivity troponin I (hsTnI) values and significant  changes across serial measurements may suggest ACS but many other  chronic and acute conditions are known to elevate hsTnI results.  Refer to the Links section for chest pain algorithms and additional  guidance. Performed at [facility], [place] [address], [place], [place] [address]   TSH     Status: None   Collection Time: 01/28/22 10:45 AM  Result Value Ref Range   TSH 2.323 0.350 - 4.500 uIU/mL    Comment: Performed by a 3rd Generation assay with a  functional sensitivity of <=0.01 uIU/mL. Performed at [facility], [place] [address] [place], [place] [address]   D-dimer, quantitative     Status: Abnormal   Collection Time: 01/28/22 11:27 AM  Result Value Ref Range   D-Dimer, Quant 0.79 (H) 0.00 - 0.50 ug/mL-FEU    Comment: (NOTE) At the manufacturer cut-off value of 0.5 g/mL FEU, this assay has a negative predictive value of 95-100%.This assay is intended for use in conjunction with a clinical pretest probability (PTP) assessment model to exclude pulmonary embolism (PE) and deep venous thrombosis (DVT) in outpatients suspected of PE or DVT. Results should be correlated with clinical presentation. Performed at [facility], [place] [address], [place], [place] [address]   Brain natriuretic peptide     Status: None   Collection Time: 01/28/22 12:08 PM  Result Value Ref Range   B Natriuretic Peptide 6.5 0.0 - 100.0 pg/mL    Comment: Performed at [facility] [address] [place], [place] [address]  Hemoglobin A1c     Status: Abnormal   Collection Time: 01/28/22  4:18 PM  Result Value Ref Range   Hgb A1c MFr Bld 6.3 (H) 4.8 - 5.6 %    Comment: (NOTE) Pre diabetes:          5.7%-6.4%  Diabetes:              >6.4%  Glycemic control for   <7.0% adults with diabetes    Mean Plasma Glucose 134.11 mg/dL    Comment: Performed at [facility] [address], [place], [place] [address]  HIV Antibody (routine testing w rflx)     Status: None   Collection Time: 01/28/22  4:18 PM  Result Value Ref Range   HIV Screen 4th Generation wRfx Non Reactive Non Reactive    Comment: Performed at [facility], [place] [address], [place], [place] [address]  Troponin I (High Sensitivity)     Status: None   Collection Time: 01/28/22  4:18 PM  Result Value Ref Range   Troponin I (High Sensitivity) 4 <18 ng/L    Comment: (NOTE) Elevated high sensitivity troponin I (hsTnI) values and significant  changes across serial  measurements may suggest ACS but many other  chronic and acute conditions are known to elevate hsTnI results.  Refer to the "Links" section for chest pain algorithms and additional  guidance. Performed at [facility], [place] [address] [place],  [address]   CBG monitoring, ED     Status: Abnormal   Collection Time: 01/28/22  5:38 PM  Result Value Ref Range   Glucose-Capillary 116 (H) 70 - 99 mg/dL    Comment: Glucose reference range applies only to samples taken after fasting for at least 8 hours.   CT Angio Chest PE W and/or Wo Contrast  Result Date: 01/28/2022 CLINICAL DATA:  Elevated  D-dimer, diaphoresis, a little chest pain, history type II diabetes mellitus, CHF, hypertension, thyroid cancer, GERD EXAM: CT ANGIOGRAPHY CHEST WITH CONTRAST TECHNIQUE: Multidetector CT imaging of the chest was performed using the standard protocol during bolus administration of intravenous contrast. Multiplanar CT image reconstructions and MIPs were obtained to evaluate the vascular anatomy. RADIATION DOSE REDUCTION: This exam was performed according to the departmental dose-optimization program which includes automated exposure control, adjustment of the mA and/or kV according to patient size and/or use of iterative reconstruction technique. CONTRAST:  67m OMNIPAQUE IOHEXOL 350 MG/ML SOLN IV COMPARISON:  CT chest 06/11/2011 FINDINGS: Cardiovascular: Mild atherosclerotic calcifications of aorta and coronary arteries. Aorta normal caliber without aneurysm or dissection. No pericardial effusion. Heart size normal. Pulmonary arteries adequately opacified and patent. No evidence of pulmonary embolism. Mediastinum/Nodes: Esophagus unremarkable. Prior thyroidectomy. Residual thymic tissue in anterior mediastinum, unchanged. No thoracic adenopathy. Lungs/Pleura: Lungs clear. No pulmonary infiltrate, pleural effusion, or pneumothorax. Upper Abdomen: Visualized upper abdomen unremarkable. Musculoskeletal: No  osseous abnormalities. Review of the MIP images confirms the above findings. IMPRESSION: No evidence of pulmonary embolism. No acute intrathoracic abnormalities. Aortic Atherosclerosis (ICD10-I70.0). Electronically Signed   By: MLavonia DanaM.D.   On: 01/28/2022 16:59   DG Knee Complete 4 Views Right  Result Date: 01/28/2022 CLINICAL DATA:  Knee pain EXAM: RIGHT KNEE - COMPLETE 4+ VIEW COMPARISON:  None Available. FINDINGS: Normal alignment no fracture. Small joint effusion. Loose body in the joint posteriorly. Mild joint space narrowing and spurring medially. Moderate degenerative change in the patellofemoral joint with joint space narrowing and spurring. Lateral joint space intact. IMPRESSION: Degenerative change medially and in the patellofemoral joint. Calcified loose body in the knee joint posteriorly. Electronically Signed   By: CFranchot GalloM.D.   On: 01/28/2022 12:04   DG Chest 2 View  Result Date: 01/28/2022 CLINICAL DATA:  Chest pain. EXAM: CHEST - 2 VIEW COMPARISON:  Chest x-ray 05/08/2021. FINDINGS: The heart size and mediastinal contours are within normal limits. Both lungs are clear. No visible pleural effusions or pneumothorax. No acute osseous abnormality. IMPRESSION: No evidence of acute cardiopulmonary disease. Electronically Signed   By: FMargaretha SheffieldM.D.   On: 01/28/2022 10:53    Pending Labs Unresulted Labs (From admission, onward)     Start     Ordered   02/04/22 0500  Creatinine, serum  (enoxaparin (LOVENOX)    CrCl >/= 30 ml/min)  Weekly,   R     Comments: while on enoxaparin therapy    01/28/22 1412   01/29/22 0500  Lipid panel  Tomorrow morning,   R        01/28/22 1412   01/28/22 1128  Urinalysis, Routine w reflex microscopic  Once,   URGENT        01/28/22 1127   Pending  Basic metabolic panel  Once,   R        Pending   Pending  CBC  Once,   R        Pending            Vitals/Pain Today's Vitals   01/28/22 1629 01/28/22 1713 01/28/22 1856 01/28/22  2023  BP:  (!) 138/100  103/77  Pulse:  98  (!) 105  Resp:  18  20  Temp: 98.5 F (36.9 C)   98.3 F (36.8 C)  TempSrc: Oral   Oral  SpO2:  100%  92%  Weight:      Height:      PainSc:  7      Isolation Precautions No active isolations  Medications Medications  atorvastatin (LIPITOR) tablet 20 mg (20 mg Oral Given 01/28/22 2024)  DULoxetine (CYMBALTA) DR capsule 60 mg (has no administration in time range)  pregabalin (LYRICA) capsule 75 mg (has no administration in time range)  thyroid (ARMOUR) tablet 120 mg (has no administration in time range)  mometasone-formoterol (DULERA) 200-5 MCG/ACT inhaler 2 puff (2 puffs Inhalation Given 01/28/22 2025)  promethazine (PHENERGAN) tablet 25 mg (has no administration in time range)  oxybutynin (DITROPAN-XL) 24 hr tablet 15 mg (15 mg Oral Given 01/28/22 2024)  pantoprazole (PROTONIX) EC tablet 40 mg (40 mg Oral Not Given 01/28/22 1714)  hydrALAZINE (APRESOLINE) injection 10 mg (has no administration in time range)  oxyCODONE (Oxy IR/ROXICODONE) immediate release tablet 20 mg (20 mg Oral Given 01/28/22 1716)  naloxone (NARCAN) injection 0.4 mg (has no administration in time range)  acetaminophen (TYLENOL) tablet 650 mg (has no administration in time range)  ondansetron (ZOFRAN) injection 4 mg (4 mg Intravenous Given 01/28/22 1734)  insulin aspart (novoLOG) injection 0-9 Units ( Subcutaneous Not Given 01/28/22 1739)  insulin aspart (novoLOG) injection 0-5 Units (has no administration in time range)  enoxaparin (LOVENOX) injection 40 mg (40 mg Subcutaneous Given 01/28/22 1723)  losartan (COZAAR) tablet 50 mg (50 mg Oral Given 01/28/22 1716)  carvedilol (COREG) tablet 6.25 mg (6.25 mg Oral Given 01/28/22 1713)  aspirin chewable tablet 324 mg (324 mg Oral Given 01/28/22 1213)  potassium chloride SA (KLOR-CON M) CR tablet 40 mEq (40 mEq Oral Given 01/28/22 1717)  alum & mag hydroxide-simeth (MAALOX/MYLANTA) 200-200-20 MG/5ML suspension 30 mL (30 mLs Oral  Given 01/28/22 1718)  furosemide (LASIX) tablet 40 mg (40 mg Oral Given 01/28/22 1713)  iohexol (OMNIPAQUE) 350 MG/ML injection 80 mL (80 mLs Intravenous Contrast Given 01/28/22 1649)    Mobility walks High fall risk   Focused Assessments Cardiac Assessment Handoff:    No results found for: "CKTOTAL", "CKMB", "CKMBINDEX", "TROPONINI" Lab Results  Component Value Date   DDIMER 0.79 (H) 01/28/2022   Does the Patient currently have chest pain? No     , Pulmonary Assessment Handoff:  Lung sounds:   O2 Device: Room Air      R Recommendations: See Admitting Provider Note  Report given to:   Additional Notes: All vital signs have been updated in the chart. Patient is a/o x4 and ambulates to the bathroom.

## 2022-01-29 ENCOUNTER — Observation Stay (HOSPITAL_COMMUNITY): Payer: Medicaid Other

## 2022-01-29 ENCOUNTER — Other Ambulatory Visit (HOSPITAL_COMMUNITY): Payer: Self-pay

## 2022-01-29 DIAGNOSIS — I259 Chronic ischemic heart disease, unspecified: Secondary | ICD-10-CM | POA: Diagnosis not present

## 2022-01-29 DIAGNOSIS — R079 Chest pain, unspecified: Secondary | ICD-10-CM | POA: Diagnosis not present

## 2022-01-29 DIAGNOSIS — I10 Essential (primary) hypertension: Secondary | ICD-10-CM | POA: Diagnosis not present

## 2022-01-29 DIAGNOSIS — R0789 Other chest pain: Principal | ICD-10-CM

## 2022-01-29 DIAGNOSIS — E785 Hyperlipidemia, unspecified: Secondary | ICD-10-CM | POA: Diagnosis not present

## 2022-01-29 LAB — ECHOCARDIOGRAM COMPLETE
Area-P 1/2: 5.38 cm2
Calc EF: 56.2 %
Height: 64 in
S' Lateral: 2.4 cm
Single Plane A2C EF: 63.4 %
Single Plane A4C EF: 55.8 %
Weight: 3584 oz

## 2022-01-29 LAB — BASIC METABOLIC PANEL
Anion gap: 13 (ref 5–15)
BUN: 8 mg/dL (ref 6–20)
CO2: 26 mmol/L (ref 22–32)
Calcium: 9.5 mg/dL (ref 8.9–10.3)
Chloride: 101 mmol/L (ref 98–111)
Creatinine, Ser: 1.22 mg/dL — ABNORMAL HIGH (ref 0.44–1.00)
GFR, Estimated: 52 mL/min — ABNORMAL LOW (ref >60)
Glucose, Bld: 117 mg/dL — ABNORMAL HIGH (ref 70–99)
Potassium: 4.2 mmol/L (ref 3.5–5.1)
Sodium: 140 mmol/L (ref 135–145)

## 2022-01-29 LAB — URINALYSIS, ROUTINE W REFLEX MICROSCOPIC
Bilirubin Urine: NEGATIVE
Glucose, UA: NEGATIVE mg/dL
Ketones, ur: NEGATIVE mg/dL
Nitrite: NEGATIVE
Protein, ur: NEGATIVE mg/dL
Specific Gravity, Urine: 1.025 (ref 1.005–1.030)
pH: 7 (ref 5.0–8.0)

## 2022-01-29 LAB — GLUCOSE, CAPILLARY
Glucose-Capillary: 132 mg/dL — ABNORMAL HIGH (ref 70–99)
Glucose-Capillary: 131 mg/dL — ABNORMAL HIGH (ref 70–99)
Glucose-Capillary: 116 mg/dL — ABNORMAL HIGH (ref 70–99)
Glucose-Capillary: 159 mg/dL — ABNORMAL HIGH (ref 70–99)

## 2022-01-29 LAB — LIPID PANEL
Cholesterol: 191 mg/dL (ref 0–200)
HDL: 41 mg/dL (ref >40)
LDL Cholesterol: 124 mg/dL — ABNORMAL HIGH (ref 0–99)
Total CHOL/HDL Ratio: 4.7 RATIO
Triglycerides: 128 mg/dL (ref <150)
VLDL: 26 mg/dL (ref 0–40)

## 2022-01-29 MED ORDER — SODIUM CHLORIDE 0.9 % IV SOLN: INTRAVENOUS | Status: DC

## 2022-01-29 MED ORDER — IVABRADINE HCL 7.5 MG PO TABS
15.0000 mg | ORAL_TABLET | Freq: Once | ORAL | Status: AC
  Administered 2022-01-29: 15 mg via ORAL
  Filled 2022-01-29 (×3): qty 2

## 2022-01-29 MED ORDER — IVABRADINE HCL 7.5 MG PO TABS
15.0000 mg | ORAL_TABLET | Freq: Once | ORAL | Status: AC
  Administered 2022-01-30: 15 mg via ORAL
  Filled 2022-01-29: qty 2

## 2022-01-29 MED ORDER — METOPROLOL TARTRATE 50 MG PO TABS
50.0000 mg | ORAL_TABLET | Freq: Once | ORAL | Status: AC
  Administered 2022-01-30: 50 mg via ORAL
  Filled 2022-01-29: qty 1

## 2022-01-29 MED ORDER — LOSARTAN POTASSIUM 25 MG PO TABS
25.0000 mg | ORAL_TABLET | Freq: Every day | ORAL | Status: DC
  Administered 2022-01-30 – 2022-01-31 (×2): 25 mg via ORAL
  Filled 2022-01-29 (×2): qty 1

## 2022-01-29 MED ORDER — ASPIRIN 81 MG PO CHEW
81.0000 mg | CHEWABLE_TABLET | Freq: Every day | ORAL | Status: DC
  Administered 2022-01-29 – 2022-01-31 (×3): 81 mg via ORAL
  Filled 2022-01-29 (×3): qty 1

## 2022-01-29 MED ORDER — BACLOFEN 10 MG PO TABS
10.0000 mg | ORAL_TABLET | Freq: Three times a day (TID) | ORAL | Status: DC | PRN
  Administered 2022-01-29 – 2022-01-31 (×4): 10 mg via ORAL
  Filled 2022-01-29 (×5): qty 1

## 2022-01-29 MED ORDER — LOSARTAN POTASSIUM 25 MG PO TABS: 25.0000 mg | ORAL_TABLET | Freq: Every day | ORAL | Status: DC

## 2022-01-29 MED ORDER — IVABRADINE HCL 7.5 MG PO TABS: 7.5000 mg | ORAL_TABLET | Freq: Once | ORAL | Status: DC

## 2022-01-29 MED ORDER — ATORVASTATIN CALCIUM 20 MG PO TABS
20.0000 mg | ORAL_TABLET | Freq: Every day | ORAL | 0 refills | Status: DC
  Filled 2022-01-29: qty 30, 30d supply, fill #0

## 2022-01-29 NOTE — Discharge Instructions (Addendum)
Follow with Primary MD Benito Mccreedy, MD in 7 days   Get CBC, CMP, Magnesium, 2 view Chest X ray -  checked next visit within 1 week by Primary MD    Activity: As tolerated with Full fall precautions use walker/cane & assistance as needed, wear the right knee brace when you are out of the bed.  Disposition Home    Diet: Heart Healthy Low Carb  Special Instructions: If you have smoked or chewed Tobacco  in the last 2 yrs please stop smoking, stop any regular Alcohol  and or any Recreational drug use.  On your next visit with your primary care physician please Get Medicines reviewed and adjusted.  Please request your Prim.MD to go over all Hospital Tests and Procedure/Radiological results at the follow up, please get all Hospital records sent to your Prim MD by signing hospital release before you go home.  If you experience worsening of your admission symptoms, develop shortness of breath, life threatening emergency, suicidal or homicidal thoughts you must seek medical attention immediately by calling 911 or calling your MD immediately  if symptoms less severe.  You Must read complete instructions/literature along with all the possible adverse reactions/side effects for all the Medicines you take and that have been prescribed to you. Take any new Medicines after you have completely understood and accpet all the possible adverse reactions/side effects.

## 2022-01-29 NOTE — Progress Notes (Signed)
PROGRESS NOTE                                                                                                                                                                                                             Patient Demographics:    Alexis [name], is a 56 y.o. female, DOB - Aug 13, 1965, NWG:[id number]  Outpatient Primary MD for the patient is Alexis [name], Alexis Beard, MD    LOS - 0  Admit date - 01/28/2022    Chief Complaint  Patient presents with   Excessive Sweating   Fatigue   Chest Pain   Knee Pain       Brief Narrative (HPI from H&P)  56 y.o. 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, GERD, gastric ulcer, chronic lymphedema, history of thyroid cancer s/p thyroidectomy who gets most of her care at [facility] comes in with 2 to 3-week history of gradually worsening exertional shortness of breath with episodes of sweating, these episodes are worse with exertion better with rest, also some atypical substernal chest pressure which is nonradiating associated with above symptoms, she is also been experiencing some fatigue for the last 2 to [redacted] weeks along with increased lower extremity edema and unexplained weight gain   Subjective:    Alexis [name] today has, No headache, No chest pain, No abdominal pain - No Nausea, No new weakness tingling or numbness, no SOB.   Assessment  & Plan :   Atypical substernal chest pressure, exertional shortness of breath and fatigue - she has atypical symptoms , seen by Cards, on Med Rx, pain free now, TTE and CTA chest stable, chronic Diastolic CHF now stable, Coronary CTA on 01/30/22.   2.  Essential hypertension in poor control.   Meds adjusted by Cards, I have dropped ARB dose today ( skipped today and cut in half for tomorrow) as now Hypotensive with mild AKI.   3.  Chronic pain and fibromyalgia.  Home medications continued.  As needed Narcan also added as she takes moderate to high doses of narcotics.   4.  Dyslipidemia.  Home dose statin.   5.  Morbid obesity with OSA.  Follow with PCP for weight loss, nighttime CPAP.   6.  Post thyroid resection hypothyroidism.  Home dose Synthroid continue check TSH.   7.  Hypokalemia.  Replaced.     8.  Mild AKI - transient hypotension + BP drop, meds adjusted, IVF tomorrow before CTA.  9. Minimally elevated D-dimer.  CTA -ve for PE.  Follow results.     10. DM type II.  Check A1c, for now sliding scale.  Holding metformin as she is going to be exposed to IV dye for CTA.   Lab Results  Component Value Date   HGBA1C 6.3 (H) 01/28/2022   CBG (last 3)  Recent Labs    01/29/22 0805 01/29/22 1146 01/29/22 1542  GLUCAP 132* 131* 116*        Condition - Fair  Family Communication  :  None  Code Status :  Full  Consults  :  Cards  PUD Prophylaxis :    Procedures  :            Disposition Plan  :    Status is: Observation   DVT Prophylaxis  :    enoxaparin (LOVENOX) injection 40 mg Start: 01/28/22 1600 Place TED hose Start: 01/28/22 1414    Lab Results  Component Value Date   PLT 411 (H) 01/28/2022    Diet :  Diet Order             Diet Carb Modified Fluid consistency: Thin; Room service appropriate? Yes  Diet effective now                    Inpatient Medications  Scheduled Meds:  aspirin  81 mg Oral Daily   atorvastatin  20 mg Oral Daily   carvedilol  6.25 mg Oral BID WC   DULoxetine  60 mg Oral QHS   enoxaparin (LOVENOX) injection  40 mg Subcutaneous Q24H   insulin aspart  0-5 Units Subcutaneous QHS   insulin aspart  0-9 Units Subcutaneous TID WC   [START ON 01/30/2022] ivabradine  15 mg Oral Once   [START ON 01/30/2022]  losartan  25 mg Oral Daily   [START ON 01/30/2022] metoprolol tartrate  50 mg Oral Once   mometasone-formoterol  2 puff Inhalation BID   oxybutynin  15 mg Oral Daily   pantoprazole  40 mg Oral Daily   pregabalin  75 mg Oral BID   thyroid  120 mg Oral QAC breakfast   Continuous Infusions:  [START ON 01/30/2022] sodium chloride     PRN Meds:.acetaminophen, baclofen, hydrALAZINE, naLOXone (NARCAN)  injection, ondansetron (ZOFRAN) IV, oxycodone, promethazine  Antibiotics  :    Anti-infectives (From admission, onward)    None        Time Spent in minutes  30   Lala Lund M.D on 01/29/2022 at 3:40 PM  To page go to [email or web address]   [facility] -  Office  [phone]  See all Orders from today for further details    Objective:   Vitals:   01/29/22 0633 01/29/22 0808 01/29/22 0953 01/29/22 1240  BP: 101/63 109/72 (!) 143/97 (!) 134/99  Pulse: 89 99 100 95  Resp: '16 17  17  '$ Temp: 97.7 F (36.5 C) 98.7 F (37.1 C)  98.3 F (36.8 C)  TempSrc: Oral Oral  Oral  SpO2: 99% 94% 97% 93%  Weight:      Height:        Wt Readings from Last 3 Encounters:  01/28/22 101.6 kg  05/08/21 98 kg  04/09/21 98 kg     Intake/Output Summary (Last 24 hours) at 01/29/2022 1540 Last data filed at 01/29/2022 1521 Gross per 24 hour  Intake 350 ml  Output --  Net 350 ml     Physical Exam  Awake Alert, No new F.N deficits, Normal affect [place].AT,PERRAL Supple Neck, No JVD,   Symmetrical Chest wall movement, Good air movement bilaterally, CTAB RRR,No Gallops,Rubs or new Murmurs,  +ve B.Sounds, Abd Soft, No tenderness,   No Cyanosis, Clubbing or edema         Data Review:    CBC Recent Labs  Lab 01/28/22 1045  WBC 11.2*  HGB 13.4  HCT 40.8  PLT 411*  MCV 87.7  MCH 28.8  MCHC 32.8  RDW 13.2    Electrolytes Recent Labs  Lab 01/28/22 1045 01/28/22 1127 01/28/22 1208 01/28/22 1618 01/29/22 0258  NA 139  --   --   --  140  K 3.3*  --   --   --  4.2  CL  100  --   --   --  101  CO2 31  --   --   --  26  GLUCOSE 121*  --   --   --  117*  BUN 6  --   --   --  8  CREATININE 0.93  --   --   --  1.22*  CALCIUM 9.6  --   --   --  9.5  AST 18  --   --   --   --   ALT 15  --   --   --   --   ALKPHOS 113  --   --   --   --   BILITOT 0.7  --   --   --   --   ALBUMIN 4.0  --   --   --   --   DDIMER  --  0.79*  --   --   --   TSH 2.323  --   --   --   --   HGBA1C  --   --   --  6.3*  --   BNP  --   --  6.5  --   --     ------------------------------------------------------------------------------------------------------------------ Recent Labs    01/29/22 0258  CHOL 191  HDL 41  LDLCALC 124*  TRIG 128  CHOLHDL 4.7    Lab Results  Component Value Date   HGBA1C 6.3 (H) 01/28/2022    Recent Labs    01/28/22 1045  TSH 2.323   ------------------------------------------------------------------------------------------------------------------ ID Labs Recent Labs  Lab 01/28/22 1045 01/28/22 1127 01/29/22 0258  WBC 11.2*  --   --   PLT 411*  --   --   DDIMER  --  0.79*  --   CREATININE 0.93  --  1.22*   Cardiac Enzymes No results for input(s): "CKMB", "TROPONINI", "MYOGLOBIN" in the last 168 hours.  Invalid input(s): "CK"       Micro Results No results found for this or any previous visit (from the past 240 hour(s)).  Radiology Reports ECHOCARDIOGRAM COMPLETE  Result Date: 01/29/2022    ECHOCARDIOGRAM REPORT   Patient Name:   GENIFER LAZENBY Date of Exam: 01/29/2022 Medical Rec #:  [id number]        Height:       64.0 in Accession #:    [id number]       Weight:  224.0 lb Date of Birth:  1965/11/04       BSA:          2.053 m Patient Age:    87 years         BP:           101/63 mmHg Patient Gender: F                HR:           63 bpm. Exam Location:  Inpatient Procedure: 2D Echo, 3D Echo, Cardiac Doppler and Color Doppler Indications:    R07.9* Chest pain, unspecified  History:        Patient has prior history of  Echocardiogram examinations, most                 recent 02/12/2021. Signs/Symptoms:Dyspnea, Shortness of Breath,                 Chest Pain and Dizziness/Lightheadedness; Risk                 Factors:Hypertension, Dyslipidemia and Diabetes.  Sonographer:    Ronny Flurry Sonographer#2:  Roseanna Rainbow RDCS Referring Phys: [facility]  Sonographer Comments: Technically difficult study due to poor echo windows and patient is obese. Image acquisition challenging due to patient body habitus. IMPRESSIONS  1. Left ventricular ejection fraction, by estimation, is 60 to 65%. The left ventricle has normal function. The left ventricle has no regional wall motion abnormalities. There is mild left ventricular hypertrophy. Left ventricular diastolic parameters are consistent with Grade I diastolic dysfunction (impaired relaxation).  2. Right ventricular systolic function is low normal. The right ventricular size is normal.  3. The mitral valve is abnormal. Trivial mitral valve regurgitation.  4. The aortic valve is tricuspid. Aortic valve regurgitation is not visualized. Aortic valve sclerosis is present, with no evidence of aortic valve stenosis.  5. The inferior vena cava is normal in size with greater than 50% respiratory variability, suggesting right atrial pressure of 3 mmHg.  6. Cannot exclude a small PFO. Comparison(s): No significant change from prior study. 02/12/2021: LVEF 60-65%. FINDINGS  Left Ventricle: Left ventricular ejection fraction, by estimation, is 60 to 65%. The left ventricle has normal function. The left ventricle has no regional wall motion abnormalities. The left ventricular internal cavity size was normal in size. There is  mild left ventricular hypertrophy. Left ventricular diastolic parameters are consistent with Grade I diastolic dysfunction (impaired relaxation). Indeterminate filling pressures. Right Ventricle: The right ventricular size is normal. No increase in right ventricular wall  thickness. Right ventricular systolic function is low normal. Left Atrium: Left atrial size was normal in size. Right Atrium: Right atrial size was normal in size. Pericardium: There is no evidence of pericardial effusion. Mitral Valve: The mitral valve is abnormal. Mild mitral annular calcification. Trivial mitral valve regurgitation. Tricuspid Valve: The tricuspid valve is grossly normal. Tricuspid valve regurgitation is trivial. Aortic Valve: The aortic valve is tricuspid. Aortic valve regurgitation is not visualized. Aortic valve sclerosis is present, with no evidence of aortic valve stenosis. Pulmonic Valve: The pulmonic valve was normal in structure. Pulmonic valve regurgitation is not visualized. Aorta: The aortic root and ascending aorta are structurally normal, with no evidence of dilitation. Venous: The inferior vena cava is normal in size with greater than 50% respiratory variability, suggesting right atrial pressure of 3 mmHg. IAS/Shunts: Cannot exclude a small PFO.  LEFT VENTRICLE PLAX 2D LVIDd:  3.40 cm     Diastology LVIDs:         2.40 cm     LV e' medial:    4.95 cm/s LV PW:         1.30 cm     LV E/e' medial:  13.0 LV IVS:        1.00 cm     LV e' lateral:   9.23 cm/s LVOT diam:     2.00 cm     LV E/e' lateral: 7.0 LV SV:         47 LV SV Index:   23 LVOT Area:     3.14 cm                             3D Volume EF: LV Volumes (MOD)           3D EF:        59 % LV vol d, MOD A2C: 75.9 ml LV EDV:       222 ml LV vol d, MOD A4C: 66.7 ml LV ESV:       91 ml LV vol s, MOD A2C: 27.8 ml LV SV:        131 ml LV vol s, MOD A4C: 29.5 ml LV SV MOD A2C:     48.1 ml LV SV MOD A4C:     66.7 ml LV SV MOD BP:      40.7 ml RIGHT VENTRICLE            IVC RV S prime:     9.95 cm/s  IVC diam: 1.30 cm TAPSE (M-mode): 1.4 cm LEFT ATRIUM             Index        RIGHT ATRIUM          Index LA diam:        2.50 cm 1.22 cm/m   RA Area:     9.35 cm LA Vol (A2C):   31.9 ml 15.54 ml/m  RA Volume:   18.20 ml 8.87  ml/m LA Vol (A4C):   38.6 ml 18.80 ml/m LA Biplane Vol: 37.3 ml 18.17 ml/m  AORTIC VALVE LVOT Vmax:   88.30 cm/s LVOT Vmean:  59.200 cm/s LVOT VTI:    0.151 m  AORTA Ao Root diam: 3.00 cm Ao Asc diam:  3.10 cm MITRAL VALVE MV Area (PHT): 5.38 cm    SHUNTS MV Decel Time: 141 msec    Systemic VTI:  0.15 m MV E velocity: 64.50 cm/s  Systemic Diam: 2.00 cm MV A velocity: 84.90 cm/s MV E/A ratio:  0.76 Lyman Bishop MD Electronically signed by Lyman Bishop MD Signature Date/Time: 01/29/2022/11:00:56 AM    Final    CT Angio Chest PE W and/or Wo Contrast  Result Date: 01/28/2022 CLINICAL DATA:  Elevated D-dimer, diaphoresis, a little chest pain, history type II diabetes mellitus, CHF, hypertension, thyroid cancer, GERD EXAM: CT ANGIOGRAPHY CHEST WITH CONTRAST TECHNIQUE: Multidetector CT imaging of the chest was performed using the standard protocol during bolus administration of intravenous contrast. Multiplanar CT image reconstructions and MIPs were obtained to evaluate the vascular anatomy. RADIATION DOSE REDUCTION: This exam was performed according to the departmental dose-optimization program which includes automated exposure control, adjustment of the mA and/or kV according to patient size and/or use of iterative reconstruction technique. CONTRAST:  81m OMNIPAQUE IOHEXOL 350 MG/ML SOLN IV COMPARISON:  CT chest 06/11/2011 FINDINGS:  Cardiovascular: Mild atherosclerotic calcifications of aorta and coronary arteries. Aorta normal caliber without aneurysm or dissection. No pericardial effusion. Heart size normal. Pulmonary arteries adequately opacified and patent. No evidence of pulmonary embolism. Mediastinum/Nodes: Esophagus unremarkable. Prior thyroidectomy. Residual thymic tissue in anterior mediastinum, unchanged. No thoracic adenopathy. Lungs/Pleura: Lungs clear. No pulmonary infiltrate, pleural effusion, or pneumothorax. Upper Abdomen: Visualized upper abdomen unremarkable. Musculoskeletal: No osseous  abnormalities. Review of the MIP images confirms the above findings. IMPRESSION: No evidence of pulmonary embolism. No acute intrathoracic abnormalities. Aortic Atherosclerosis (ICD10-I70.0). Electronically Signed   By: Lavonia Dana M.D.   On: 01/28/2022 16:59   DG Knee Complete 4 Views Right  Result Date: 01/28/2022 CLINICAL DATA:  Knee pain EXAM: RIGHT KNEE - COMPLETE 4+ VIEW COMPARISON:  None Available. FINDINGS: Normal alignment no fracture. Small joint effusion. Loose body in the joint posteriorly. Mild joint space narrowing and spurring medially. Moderate degenerative change in the patellofemoral joint with joint space narrowing and spurring. Lateral joint space intact. IMPRESSION: Degenerative change medially and in the patellofemoral joint. Calcified loose body in the knee joint posteriorly. Electronically Signed   By: Franchot Gallo M.D.   On: 01/28/2022 12:04   DG Chest 2 View  Result Date: 01/28/2022 CLINICAL DATA:  Chest pain. EXAM: CHEST - 2 VIEW COMPARISON:  Chest x-ray 05/08/2021. FINDINGS: The heart size and mediastinal contours are within normal limits. Both lungs are clear. No visible pleural effusions or pneumothorax. No acute osseous abnormality. IMPRESSION: No evidence of acute cardiopulmonary disease. Electronically Signed   By: Margaretha Sheffield M.D.   On: 01/28/2022 10:53

## 2022-01-29 NOTE — Progress Notes (Signed)
CSW received consult for patient. CSW met with patient at bedside. Patient reports she comes from extended stay hotel in [place] with her son. Patient reports she plans on returning to hotel when medically ready for dc. Patient reports she plans on driving back home when medically ready for dc. Patient reports she drove herself to hospital. Patient confirmed no other resources needed. Patient reports receives assistance with food, has transportation, and shelter/lives in hotel. All questions answered. No further questions reported at this time.

## 2022-01-29 NOTE — Plan of Care (Signed)
  Problem: Education: Goal: Ability to describe self-care measures that may prevent or decrease complications (Diabetes Survival Skills Education) will improve Outcome: Progressing Goal: Individualized Educational Video(s) Outcome: Progressing   Problem: Coping: Goal: Ability to adjust to condition or change in health will improve Outcome: Progressing   Problem: Fluid Volume: Goal: Ability to maintain a balanced intake and output will improve Outcome: Progressing   Problem: Health Behavior/Discharge Planning: Goal: Ability to identify and utilize available resources and services will improve Outcome: Progressing Goal: Ability to manage health-related needs will improve Outcome: Progressing   Problem: Metabolic: Goal: Ability to maintain appropriate glucose levels will improve Outcome: Progressing   Problem: Nutritional: Goal: Maintenance of adequate nutrition will improve Outcome: Progressing Goal: Progress toward achieving an optimal weight will improve Outcome: Progressing   Problem: Skin Integrity: Goal: Risk for impaired skin integrity will decrease Outcome: Progressing   Problem: Tissue Perfusion: Goal: Adequacy of tissue perfusion will improve Outcome: Progressing   Problem: Education: Goal: Understanding of cardiac disease, CV risk reduction, and recovery process will improve Outcome: Progressing Goal: Individualized Educational Video(s) Outcome: Progressing   Problem: Activity: Goal: Ability to tolerate increased activity will improve Outcome: Progressing   Problem: Cardiac: Goal: Ability to achieve and maintain adequate cardiovascular perfusion will improve Outcome: Progressing   Problem: Health Behavior/Discharge Planning: Goal: Ability to safely manage health-related needs after discharge will improve Outcome: Progressing   Problem: Education: Goal: Knowledge of General Education information will improve Description: Including pain rating scale,  medication(s)/side effects and non-pharmacologic comfort measures Outcome: Progressing   Problem: Health Behavior/Discharge Planning: Goal: Ability to manage health-related needs will improve Outcome: Progressing   Problem: Clinical Measurements: Goal: Ability to maintain clinical measurements within normal limits will improve Outcome: Progressing Goal: Will remain free from infection Outcome: Progressing Goal: Diagnostic test results will improve Outcome: Progressing Goal: Respiratory complications will improve Outcome: Progressing Goal: Cardiovascular complication will be avoided Outcome: Progressing   Problem: Activity: Goal: Risk for activity intolerance will decrease Outcome: Progressing   Problem: Nutrition: Goal: Adequate nutrition will be maintained Outcome: Progressing   Problem: Coping: Goal: Level of anxiety will decrease Outcome: Progressing   Problem: Elimination: Goal: Will not experience complications related to bowel motility Outcome: Progressing Goal: Will not experience complications related to urinary retention Outcome: Progressing   Problem: Pain Managment: Goal: General experience of comfort will improve Outcome: Progressing   Problem: Safety: Goal: Ability to remain free from injury will improve Outcome: Progressing   Problem: Skin Integrity: Goal: Risk for impaired skin integrity will decrease Outcome: Progressing   

## 2022-01-29 NOTE — Progress Notes (Signed)
Progress Note  Patient Name: Alexis [name] Date of Encounter: 01/29/2022  [facility] Cardiologist: None   Subjective   Still complaining of chest pressure  Inpatient Medications    Scheduled Meds:  aspirin  81 mg Oral Daily   atorvastatin  20 mg Oral Daily   carvedilol  6.25 mg Oral BID WC   DULoxetine  60 mg Oral QHS   enoxaparin (LOVENOX) injection  40 mg Subcutaneous Q24H   insulin aspart  0-5 Units Subcutaneous QHS   insulin aspart  0-9 Units Subcutaneous TID WC   losartan  50 mg Oral Daily   mometasone-formoterol  2 puff Inhalation BID   oxybutynin  15 mg Oral Daily   pantoprazole  40 mg Oral Daily   pregabalin  75 mg Oral BID   thyroid  120 mg Oral QAC breakfast   Continuous Infusions:  PRN Meds: acetaminophen, hydrALAZINE, naLOXone (NARCAN)  injection, ondansetron (ZOFRAN) IV, oxycodone, promethazine   Vital Signs    Vitals:   01/28/22 1713 01/28/22 2023 01/28/22 2245 01/29/22 0633  BP: (!) 138/100 103/77 130/78 101/63  Pulse: 98 (!) 105 100 89  Resp: '18 20 18 16  '$ Temp:  98.3 F (36.8 C) 97.8 F (36.6 C) 97.7 F (36.5 C)  TempSrc:  Oral Oral Oral  SpO2: 100% 92% 95% 99%  Weight:      Height:        Intake/Output Summary (Last 24 hours) at 01/29/2022 0737 Last data filed at 01/29/2022 0645 Gross per 24 hour  Intake 120 ml  Output --  Net 120 ml      01/28/2022   10:28 AM 05/08/2021    7:20 PM 04/09/2021    1:37 PM  Last 3 Weights  Weight (lbs) 224 lb 216 lb 216 lb  Weight (kg) 101.606 kg 97.977 kg 97.977 kg      Telemetry    Normal sinus rhythm- Personally Reviewed  ECG    New EKG to review- Personally Reviewed  Physical Exam   GEN: No acute distress.   Neck: No JVD Cardiac: RRR, no murmurs, rubs, or gallops.  Respiratory: Clear to auscultation bilaterally. GI: Soft, nontender, non-distended  MS: No edema; No deformity. Neuro:  Nonfocal  Psych: Normal affect   Labs    High Sensitivity Troponin:   Recent Labs  Lab  01/28/22 1045 01/28/22 1618  TROPONINIHS 5 4      Chemistry Recent Labs  Lab 01/28/22 1045  NA 139  K 3.3*  CL 100  CO2 31  GLUCOSE 121*  BUN 6  CREATININE 0.93  CALCIUM 9.6  PROT 7.8  ALBUMIN 4.0  AST 18  ALT 15  ALKPHOS 113  BILITOT 0.7  GFRNONAA >60  ANIONGAP 8     Hematology Recent Labs  Lab 01/28/22 1045  WBC 11.2*  RBC 4.65  HGB 13.4  HCT 40.8  MCV 87.7  MCH 28.8  MCHC 32.8  RDW 13.2  PLT 411*    BNP Recent Labs  Lab 01/28/22 1208  BNP 6.5     DDimer  Recent Labs  Lab 01/28/22 1127  DDIMER 0.79*     CHA2DS2-VASc Score =     This indicates a  % annual risk of stroke. The patient's score is based upon:        Radiology    CT Angio Chest PE W and/or Wo Contrast  Result Date: 01/28/2022 CLINICAL DATA:  Elevated D-dimer, diaphoresis, a little chest pain, history type II diabetes  mellitus, CHF, hypertension, thyroid cancer, GERD EXAM: CT ANGIOGRAPHY CHEST WITH CONTRAST TECHNIQUE: Multidetector CT imaging of the chest was performed using the standard protocol during bolus administration of intravenous contrast. Multiplanar CT image reconstructions and MIPs were obtained to evaluate the vascular anatomy. RADIATION DOSE REDUCTION: This exam was performed according to the departmental dose-optimization program which includes automated exposure control, adjustment of the mA and/or kV according to patient size and/or use of iterative reconstruction technique. CONTRAST:  31m OMNIPAQUE IOHEXOL 350 MG/ML SOLN IV COMPARISON:  CT chest 06/11/2011 FINDINGS: Cardiovascular: Mild atherosclerotic calcifications of aorta and coronary arteries. Aorta normal caliber without aneurysm or dissection. No pericardial effusion. Heart size normal. Pulmonary arteries adequately opacified and patent. No evidence of pulmonary embolism. Mediastinum/Nodes: Esophagus unremarkable. Prior thyroidectomy. Residual thymic tissue in anterior mediastinum, unchanged. No thoracic  adenopathy. Lungs/Pleura: Lungs clear. No pulmonary infiltrate, pleural effusion, or pneumothorax. Upper Abdomen: Visualized upper abdomen unremarkable. Musculoskeletal: No osseous abnormalities. Review of the MIP images confirms the above findings. IMPRESSION: No evidence of pulmonary embolism. No acute intrathoracic abnormalities. Aortic Atherosclerosis (ICD10-I70.0). Electronically Signed   By: MLavonia DanaM.D.   On: 01/28/2022 16:59   DG Knee Complete 4 Views Right  Result Date: 01/28/2022 CLINICAL DATA:  Knee pain EXAM: RIGHT KNEE - COMPLETE 4+ VIEW COMPARISON:  None Available. FINDINGS: Normal alignment no fracture. Small joint effusion. Loose body in the joint posteriorly. Mild joint space narrowing and spurring medially. Moderate degenerative change in the patellofemoral joint with joint space narrowing and spurring. Lateral joint space intact. IMPRESSION: Degenerative change medially and in the patellofemoral joint. Calcified loose body in the knee joint posteriorly. Electronically Signed   By: CFranchot GalloM.D.   On: 01/28/2022 12:04   DG Chest 2 View  Result Date: 01/28/2022 CLINICAL DATA:  Chest pain. EXAM: CHEST - 2 VIEW COMPARISON:  Chest x-ray 05/08/2021. FINDINGS: The heart size and mediastinal contours are within normal limits. Both lungs are clear. No visible pleural effusions or pneumothorax. No acute osseous abnormality. IMPRESSION: No evidence of acute cardiopulmonary disease. Electronically Signed   By: FMargaretha SheffieldM.D.   On: 01/28/2022 10:53    Cardiac Studies   2D echo pending  Patient Profile     56y.o. female  with a hx of hypertension, diabetes mellitus, hyperlipidemia, ASD defect, and morbid obesity who is being seen 01/28/2022 for the evaluation of dyspnea on exertion at the request of Dr. SCandiss Norse  Assessment & Plan    Atypical chest pain /dyspnea on exertion/diaphoresis -Patient chronic dyspnea on exertion with extensive work-up by Dr. GEinar Gipas above.  [facility]  Reportedly having abnormal stress test but no work-up done afterwards.  Patient has chronic abnormal EKG with repolarization/T wave abnormality.  She lives sedentary lifestyle.  [facility] -Now worsening dyspnea on exertion with episodic diaphoretic episode with and without chest pain and palpitations.  -Troponin normal -BNP normal  -D-dimer minimally elevated.  Chest CT negative for PE but did show coronary calcifications  -2D echo pending -Plan for coronary CTA to define coronary anatomy today>> heart rate is in the 80s and BP is too soft to give high-dose metoprolol for coronary CTA so we will give Ivabradine 7.5 mg 2 hours prior to study   HTN -BP elevated yesterday -Stopped lisinopril and started losartan 25 mg daily -Stopped verapamil and added carvedilol 6.25 mg twice daily -BP improved today but on the soft side -decrease carvedilol to 3.125 mg twice daily  Palpitations -No arrhythmias on telemetry -We will defer to her cardiologist at [place]       For questions or updates, please contact [place] Please consult [email or web address] for contact info under        Signed, Fransico Him, MD  01/29/2022, 7:37 AM

## 2022-01-29 NOTE — Care Management (Signed)
1510 01-29-22 Case Manager received a consult for medication assistance. Patient has Medicaid that this currently active and her medications are $4.00 each. Patient uses the [employer]. No further needs identified at this time.

## 2022-01-29 NOTE — Progress Notes (Signed)
  Echocardiogram 2D Echocardiogram has been performed.  Alexis [name] 01/29/2022, 9:08 AM

## 2022-01-30 ENCOUNTER — Inpatient Hospital Stay (HOSPITAL_COMMUNITY): Payer: Medicaid Other

## 2022-01-30 ENCOUNTER — Observation Stay (HOSPITAL_COMMUNITY): Payer: Medicaid Other

## 2022-01-30 DIAGNOSIS — Z79899 Other long term (current) drug therapy: Secondary | ICD-10-CM | POA: Diagnosis not present

## 2022-01-30 DIAGNOSIS — E785 Hyperlipidemia, unspecified: Secondary | ICD-10-CM

## 2022-01-30 DIAGNOSIS — R079 Chest pain, unspecified: Secondary | ICD-10-CM

## 2022-01-30 DIAGNOSIS — E1142 Type 2 diabetes mellitus with diabetic polyneuropathy: Secondary | ICD-10-CM | POA: Diagnosis present

## 2022-01-30 DIAGNOSIS — Z85850 Personal history of malignant neoplasm of thyroid: Secondary | ICD-10-CM | POA: Diagnosis not present

## 2022-01-30 DIAGNOSIS — I959 Hypotension, unspecified: Secondary | ICD-10-CM | POA: Diagnosis present

## 2022-01-30 DIAGNOSIS — I2510 Atherosclerotic heart disease of native coronary artery without angina pectoris: Secondary | ICD-10-CM | POA: Diagnosis present

## 2022-01-30 DIAGNOSIS — Z20822 Contact with and (suspected) exposure to covid-19: Secondary | ICD-10-CM | POA: Diagnosis present

## 2022-01-30 DIAGNOSIS — N179 Acute kidney failure, unspecified: Secondary | ICD-10-CM | POA: Diagnosis present

## 2022-01-30 DIAGNOSIS — E7800 Pure hypercholesterolemia, unspecified: Secondary | ICD-10-CM | POA: Diagnosis present

## 2022-01-30 DIAGNOSIS — G8929 Other chronic pain: Secondary | ICD-10-CM | POA: Diagnosis present

## 2022-01-30 DIAGNOSIS — G4733 Obstructive sleep apnea (adult) (pediatric): Secondary | ICD-10-CM | POA: Diagnosis present

## 2022-01-30 DIAGNOSIS — D573 Sickle-cell trait: Secondary | ICD-10-CM | POA: Diagnosis present

## 2022-01-30 DIAGNOSIS — K760 Fatty (change of) liver, not elsewhere classified: Secondary | ICD-10-CM | POA: Diagnosis present

## 2022-01-30 DIAGNOSIS — E876 Hypokalemia: Secondary | ICD-10-CM | POA: Diagnosis present

## 2022-01-30 DIAGNOSIS — R0789 Other chest pain: Secondary | ICD-10-CM | POA: Diagnosis not present

## 2022-01-30 DIAGNOSIS — J45909 Unspecified asthma, uncomplicated: Secondary | ICD-10-CM | POA: Diagnosis present

## 2022-01-30 DIAGNOSIS — I110 Hypertensive heart disease with heart failure: Secondary | ICD-10-CM | POA: Diagnosis present

## 2022-01-30 DIAGNOSIS — F32A Depression, unspecified: Secondary | ICD-10-CM | POA: Diagnosis present

## 2022-01-30 DIAGNOSIS — M1711 Unilateral primary osteoarthritis, right knee: Secondary | ICD-10-CM | POA: Diagnosis present

## 2022-01-30 DIAGNOSIS — M3500 Sicca syndrome, unspecified: Secondary | ICD-10-CM | POA: Diagnosis present

## 2022-01-30 DIAGNOSIS — R61 Generalized hyperhidrosis: Secondary | ICD-10-CM | POA: Diagnosis not present

## 2022-01-30 DIAGNOSIS — E890 Postprocedural hypothyroidism: Secondary | ICD-10-CM | POA: Diagnosis present

## 2022-01-30 DIAGNOSIS — I10 Essential (primary) hypertension: Secondary | ICD-10-CM | POA: Diagnosis not present

## 2022-01-30 DIAGNOSIS — I5032 Chronic diastolic (congestive) heart failure: Secondary | ICD-10-CM | POA: Diagnosis present

## 2022-01-30 DIAGNOSIS — R7989 Other specified abnormal findings of blood chemistry: Secondary | ICD-10-CM | POA: Diagnosis present

## 2022-01-30 DIAGNOSIS — M797 Fibromyalgia: Secondary | ICD-10-CM | POA: Diagnosis present

## 2022-01-30 LAB — GLUCOSE, CAPILLARY
Glucose-Capillary: 134 mg/dL — ABNORMAL HIGH (ref 70–99)
Glucose-Capillary: 133 mg/dL — ABNORMAL HIGH (ref 70–99)
Glucose-Capillary: 102 mg/dL — ABNORMAL HIGH (ref 70–99)
Glucose-Capillary: 136 mg/dL — ABNORMAL HIGH (ref 70–99)

## 2022-01-30 LAB — BASIC METABOLIC PANEL
Anion gap: 10 (ref 5–15)
BUN: 12 mg/dL (ref 6–20)
CO2: 29 mmol/L (ref 22–32)
Calcium: 9.6 mg/dL (ref 8.9–10.3)
Chloride: 100 mmol/L (ref 98–111)
Creatinine, Ser: 0.96 mg/dL (ref 0.44–1.00)
GFR, Estimated: >60 mL/min (ref >60)
Glucose, Bld: 116 mg/dL — ABNORMAL HIGH (ref 70–99)
Potassium: 4.6 mmol/L (ref 3.5–5.1)
Sodium: 139 mmol/L (ref 135–145)

## 2022-01-30 MED ORDER — ORAL CARE MOUTH RINSE: 15.0000 mL | OROMUCOSAL | Status: DC | PRN

## 2022-01-30 MED ORDER — NITROGLYCERIN 0.4 MG SL SUBL: 0.8000 mg | SUBLINGUAL_TABLET | Freq: Once | SUBLINGUAL | Status: AC

## 2022-01-30 MED ORDER — IOHEXOL 350 MG/ML SOLN
100.0000 mL | Freq: Once | INTRAVENOUS | Status: AC | PRN
  Administered 2022-01-30: 85 mL via INTRAVENOUS

## 2022-01-30 MED ORDER — METOPROLOL TARTRATE 5 MG/5ML IV SOLN
INTRAVENOUS | Status: AC
  Filled 2022-01-30: qty 10

## 2022-01-30 MED ORDER — NITROGLYCERIN 0.4 MG SL SUBL
SUBLINGUAL_TABLET | SUBLINGUAL | Status: AC
  Administered 2022-01-30: 0.8 mg via SUBLINGUAL
  Filled 2022-01-30: qty 2

## 2022-01-30 MED ORDER — SODIUM CHLORIDE 0.9 % IV SOLN: INTRAVENOUS | Status: AC

## 2022-01-30 NOTE — Progress Notes (Signed)
PROGRESS NOTE                                                                                                                                                                                                             Patient Demographics:    Alexis [name], is a 56 y.o. female, DOB - 1966-04-24, BDZ:[id number]  Outpatient Primary MD for the patient is Osei-Bonsu, Iona Beard, MD    LOS - 0  Admit date - 01/28/2022    Chief Complaint  Patient presents with   Excessive Sweating   Fatigue   Chest Pain   Knee Pain       Brief Narrative (HPI from H&P)  56 y.o. 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, GERD, gastric ulcer, chronic lymphedema, history of thyroid cancer s/p thyroidectomy who gets most of her care at [facility] comes in with 2 to 3-week history of gradually worsening exertional shortness of breath with episodes of sweating, these episodes are worse with exertion better with rest, also some atypical substernal chest pressure which is nonradiating associated with above symptoms, she is also been experiencing some fatigue for the last 2 to [redacted] weeks along with increased lower extremity edema and unexplained weight gain   Subjective:   Patient in bed, appears comfortable, denies any headache, no fever, no chest pain or pressure, no shortness of breath , no abdominal pain. No focal weakness.  However continues to have some right knee discomfort which started several days ago but not improved, unable to bear weight comfortably on it.   Assessment  & Plan :   Atypical substernal chest pressure, exertional shortness of breath and fatigue - she has atypical symptoms , seen  by Cards, on Med Rx, pain free now, TTE and CTA chest stable, chronic Diastolic CHF now stable, Coronary CTA on 01/31/22.   2.  Essential hypertension in poor control.  Meds adjusted by Cards continue to monitor.   3.  Chronic pain and fibromyalgia.  Home medications continued.  As needed Narcan also added as she takes moderate to high doses of narcotics.   4.  Dyslipidemia.  Home dose statin.  5.  Morbid obesity with OSA.  Follow with PCP for weight loss, nighttime CPAP.   6.  Post thyroid resection hypothyroidism.  Home dose Synthroid continue check TSH.   7.  Hypokalemia.  Replaced.     8.  Mild AKI - transient hypotension + BP drop, meds adjusted, IVF tomorrow before CTA.  9. Minimally elevated D-dimer.  CTA -ve for PE.  Follow results.     10. Acute on chronic right knee osteoarthritis pain.  X-ray nonacute, pain still persist, per orthopedics, get MRI right knee.  May require PT OT and walker at home.  Currently unable to ambulate without discomfort.  11. DM type II.  Check A1c, for now sliding scale.  Holding metformin as she is going to be exposed to IV dye for CTA.   Lab Results  Component Value Date   HGBA1C 6.3 (H) 01/28/2022   CBG (last 3)  Recent Labs    01/29/22 1542 01/29/22 2153 01/30/22 0801  GLUCAP 116* 159* 134*        Condition - Fair  Family Communication  :  None  Code Status :  Full  Consults  :  Cards  PUD Prophylaxis :    Procedures  :              Disposition Plan  :    Status is: Observation   DVT Prophylaxis  :    enoxaparin (LOVENOX) injection 40 mg Start: 01/28/22 1600 Place TED hose Start: 01/28/22 1414    Lab Results  Component Value Date   PLT 411 (H) 01/28/2022    Diet :  Diet Order             Diet Carb Modified Fluid consistency: Thin; Room service appropriate? Yes  Diet effective now                    Inpatient Medications  Scheduled Meds:  aspirin  81 mg Oral Daily   atorvastatin  20 mg Oral  Daily   carvedilol  6.25 mg Oral BID WC   DULoxetine  60 mg Oral QHS   enoxaparin (LOVENOX) injection  40 mg Subcutaneous Q24H   insulin aspart  0-5 Units Subcutaneous QHS   insulin aspart  0-9 Units Subcutaneous TID WC   losartan  25 mg Oral Daily   metoprolol tartrate       mometasone-formoterol  2 puff Inhalation BID   nitroGLYCERIN       oxybutynin  15 mg Oral Daily   pantoprazole  40 mg Oral Daily   pregabalin  75 mg Oral BID   thyroid  120 mg Oral QAC breakfast   Continuous Infusions:  sodium chloride 100 mL/hr at 01/30/22 0827   PRN Meds:.acetaminophen, baclofen, hydrALAZINE, metoprolol tartrate, naLOXone (NARCAN)  injection, nitroGLYCERIN, ondansetron (ZOFRAN) IV, mouth rinse, oxycodone, promethazine  Antibiotics  :    Anti-infectives (From admission, onward)    None        Time Spent in minutes  30   Lala Lund M.D on 01/30/2022 at 9:29 AM  To page go to [email or web address]   [facility] -  Office  [phone]  See all Orders from today for further details    Objective:   Vitals:   01/30/22 0006 01/30/22 0010 01/30/22 0628 01/30/22 0802  BP: (!) 149/77 (!) 149/77 (!) 160/88 (!) 128/46  Pulse: 70 87 81 67  Resp: '16 18 16 18  '$ Temp: 98.7 F (37.1 C)  98.8 F (37.1  C) 98.6 F (37 C)  TempSrc: Oral  Oral Oral  SpO2: (!) 87% 98% 96% 97%  Weight:      Height:        Wt Readings from Last 3 Encounters:  01/28/22 101.6 kg  05/08/21 98 kg  04/09/21 98 kg     Intake/Output Summary (Last 24 hours) at 01/30/2022 0929 Last data filed at 01/29/2022 1521 Gross per 24 hour  Intake 230 ml  Output --  Net 230 ml     Physical Exam  Awake Alert, No new F.N deficits, Normal affect [place].AT,PERRAL Supple Neck, No JVD,   Symmetrical Chest wall movement, Good air movement bilaterally, CTAB RRR,No Gallops, Rubs or new Murmurs,  +ve B.Sounds, Abd Soft, No tenderness,   R. Knee flexed, minimal effusion    Data Review:    CBC Recent Labs  Lab  01/28/22 1045  WBC 11.2*  HGB 13.4  HCT 40.8  PLT 411*  MCV 87.7  MCH 28.8  MCHC 32.8  RDW 13.2    Electrolytes Recent Labs  Lab 01/28/22 1045 01/28/22 1127 01/28/22 1208 01/28/22 1618 01/29/22 0258 01/30/22 0626  NA 139  --   --   --  140 139  K 3.3*  --   --   --  4.2 4.6  CL 100  --   --   --  101 100  CO2 31  --   --   --  26 29  GLUCOSE 121*  --   --   --  117* 116*  BUN 6  --   --   --  8 12  CREATININE 0.93  --   --   --  1.22* 0.96  CALCIUM 9.6  --   --   --  9.5 9.6  AST 18  --   --   --   --   --   ALT 15  --   --   --   --   --   ALKPHOS 113  --   --   --   --   --   BILITOT 0.7  --   --   --   --   --   ALBUMIN 4.0  --   --   --   --   --   DDIMER  --  0.79*  --   --   --   --   TSH 2.323  --   --   --   --   --   HGBA1C  --   --   --  6.3*  --   --   BNP  --   --  6.5  --   --   --     ------------------------------------------------------------------------------------------------------------------ Recent Labs    01/29/22 0258  CHOL 191  HDL 41  LDLCALC 124*  TRIG 128  CHOLHDL 4.7    Lab Results  Component Value Date   HGBA1C 6.3 (H) 01/28/2022    Recent Labs    01/28/22 1045  TSH 2.323   ------------------------------------------------------------------------------------------------------------------ ID Labs Recent Labs  Lab 01/28/22 1045 01/28/22 1127 01/29/22 0258 01/30/22 0626  WBC 11.2*  --   --   --   PLT 411*  --   --   --   DDIMER  --  0.79*  --   --   CREATININE 0.93  --  1.22* 0.96   Cardiac Enzymes No results for input(s): "CKMB", "TROPONINI", "MYOGLOBIN" in the last 168 hours.  Invalid input(s): "CK"       Micro Results No results found for this or any previous visit (from the past 240 hour(s)).  Radiology Reports ECHOCARDIOGRAM COMPLETE  Result Date: 01/29/2022    ECHOCARDIOGRAM REPORT   Patient Name:   LAMYRA MALCOLM Date of Exam: 01/29/2022 Medical Rec #:  [id number]        Height:       64.0 in  Accession #:    [id number]       Weight:       224.0 lb Date of Birth:  23-Feb-1966       BSA:          2.053 m Patient Age:    52 years         BP:           101/63 mmHg Patient Gender: F                HR:           63 bpm. Exam Location:  Inpatient Procedure: 2D Echo, 3D Echo, Cardiac Doppler and Color Doppler Indications:    R07.9* Chest pain, unspecified  History:        Patient has prior history of Echocardiogram examinations, most                 recent 02/12/2021. Signs/Symptoms:Dyspnea, Shortness of Breath,                 Chest Pain and Dizziness/Lightheadedness; Risk                 Factors:Hypertension, Dyslipidemia and Diabetes.  Sonographer:    Ronny Flurry Sonographer#2:  Roseanna Rainbow RDCS Referring Phys: Graylin Shiver [facility]  Sonographer Comments: Technically difficult study due to poor echo windows and patient is obese. Image acquisition challenging due to patient body habitus. IMPRESSIONS  1. Left ventricular ejection fraction, by estimation, is 60 to 65%. The left ventricle has normal function. The left ventricle has no regional wall motion abnormalities. There is mild left ventricular hypertrophy. Left ventricular diastolic parameters are consistent with Grade I diastolic dysfunction (impaired relaxation).  2. Right ventricular systolic function is low normal. The right ventricular size is normal.  3. The mitral valve is abnormal. Trivial mitral valve regurgitation.  4. The aortic valve is tricuspid. Aortic valve regurgitation is not visualized. Aortic valve sclerosis is present, with no evidence of aortic valve stenosis.  5. The inferior vena cava is normal in size with greater than 50% respiratory variability, suggesting right atrial pressure of 3 mmHg.  6. Cannot exclude a small PFO. Comparison(s): No significant change from prior study. 02/12/2021: LVEF 60-65%. FINDINGS  Left Ventricle: Left ventricular ejection fraction, by estimation, is 60 to 65%. The left ventricle has normal function.  The left ventricle has no regional wall motion abnormalities. The left ventricular internal cavity size was normal in size. There is  mild left ventricular hypertrophy. Left ventricular diastolic parameters are consistent with Grade I diastolic dysfunction (impaired relaxation). Indeterminate filling pressures. Right Ventricle: The right ventricular size is normal. No increase in right ventricular wall thickness. Right ventricular systolic function is low normal. Left Atrium: Left atrial size was normal in size. Right Atrium: Right atrial size was normal in size. Pericardium: There is no evidence of pericardial effusion. Mitral Valve: The mitral valve is abnormal. Mild mitral annular calcification. Trivial mitral valve regurgitation. Tricuspid Valve: The tricuspid valve is grossly normal. Tricuspid valve regurgitation is trivial. Aortic Valve: The  aortic valve is tricuspid. Aortic valve regurgitation is not visualized. Aortic valve sclerosis is present, with no evidence of aortic valve stenosis. Pulmonic Valve: The pulmonic valve was normal in structure. Pulmonic valve regurgitation is not visualized. Aorta: The aortic root and ascending aorta are structurally normal, with no evidence of dilitation. Venous: The inferior vena cava is normal in size with greater than 50% respiratory variability, suggesting right atrial pressure of 3 mmHg. IAS/Shunts: Cannot exclude a small PFO.  LEFT VENTRICLE PLAX 2D LVIDd:         3.40 cm     Diastology LVIDs:         2.40 cm     LV e' medial:    4.95 cm/s LV PW:         1.30 cm     LV E/e' medial:  13.0 LV IVS:        1.00 cm     LV e' lateral:   9.23 cm/s LVOT diam:     2.00 cm     LV E/e' lateral: 7.0 LV SV:         47 LV SV Index:   23 LVOT Area:     3.14 cm                             3D Volume EF: LV Volumes (MOD)           3D EF:        59 % LV vol d, MOD A2C: 75.9 ml LV EDV:       222 ml LV vol d, MOD A4C: 66.7 ml LV ESV:       91 ml LV vol s, MOD A2C: 27.8 ml LV SV:         131 ml LV vol s, MOD A4C: 29.5 ml LV SV MOD A2C:     48.1 ml LV SV MOD A4C:     66.7 ml LV SV MOD BP:      40.7 ml RIGHT VENTRICLE            IVC RV S prime:     9.95 cm/s  IVC diam: 1.30 cm TAPSE (M-mode): 1.4 cm LEFT ATRIUM             Index        RIGHT ATRIUM          Index LA diam:        2.50 cm 1.22 cm/m   RA Area:     9.35 cm LA Vol (A2C):   31.9 ml 15.54 ml/m  RA Volume:   18.20 ml 8.87 ml/m LA Vol (A4C):   38.6 ml 18.80 ml/m LA Biplane Vol: 37.3 ml 18.17 ml/m  AORTIC VALVE LVOT Vmax:   88.30 cm/s LVOT Vmean:  59.200 cm/s LVOT VTI:    0.151 m  AORTA Ao Root diam: 3.00 cm Ao Asc diam:  3.10 cm MITRAL VALVE MV Area (PHT): 5.38 cm    SHUNTS MV Decel Time: 141 msec    Systemic VTI:  0.15 m MV E velocity: 64.50 cm/s  Systemic Diam: 2.00 cm MV A velocity: 84.90 cm/s MV E/A ratio:  0.76 Lyman Bishop MD Electronically signed by Lyman Bishop MD Signature Date/Time: 01/29/2022/11:00:56 AM    Final    CT Angio Chest PE W and/or Wo Contrast  Result Date: 01/28/2022 CLINICAL DATA:  Elevated D-dimer, diaphoresis, a little chest pain, history type II  diabetes mellitus, CHF, hypertension, thyroid cancer, GERD EXAM: CT ANGIOGRAPHY CHEST WITH CONTRAST TECHNIQUE: Multidetector CT imaging of the chest was performed using the standard protocol during bolus administration of intravenous contrast. Multiplanar CT image reconstructions and MIPs were obtained to evaluate the vascular anatomy. RADIATION DOSE REDUCTION: This exam was performed according to the departmental dose-optimization program which includes automated exposure control, adjustment of the mA and/or kV according to patient size and/or use of iterative reconstruction technique. CONTRAST:  20m OMNIPAQUE IOHEXOL 350 MG/ML SOLN IV COMPARISON:  CT chest 06/11/2011 FINDINGS: Cardiovascular: Mild atherosclerotic calcifications of aorta and coronary arteries. Aorta normal caliber without aneurysm or dissection. No pericardial effusion. Heart size normal.  Pulmonary arteries adequately opacified and patent. No evidence of pulmonary embolism. Mediastinum/Nodes: Esophagus unremarkable. Prior thyroidectomy. Residual thymic tissue in anterior mediastinum, unchanged. No thoracic adenopathy. Lungs/Pleura: Lungs clear. No pulmonary infiltrate, pleural effusion, or pneumothorax. Upper Abdomen: Visualized upper abdomen unremarkable. Musculoskeletal: No osseous abnormalities. Review of the MIP images confirms the above findings. IMPRESSION: No evidence of pulmonary embolism. No acute intrathoracic abnormalities. Aortic Atherosclerosis (ICD10-I70.0). Electronically Signed   By: MLavonia DanaM.D.   On: 01/28/2022 16:59   DG Knee Complete 4 Views Right  Result Date: 01/28/2022 CLINICAL DATA:  Knee pain EXAM: RIGHT KNEE - COMPLETE 4+ VIEW COMPARISON:  None Available. FINDINGS: Normal alignment no fracture. Small joint effusion. Loose body in the joint posteriorly. Mild joint space narrowing and spurring medially. Moderate degenerative change in the patellofemoral joint with joint space narrowing and spurring. Lateral joint space intact. IMPRESSION: Degenerative change medially and in the patellofemoral joint. Calcified loose body in the knee joint posteriorly. Electronically Signed   By: CFranchot GalloM.D.   On: 01/28/2022 12:04   DG Chest 2 View  Result Date: 01/28/2022 CLINICAL DATA:  Chest pain. EXAM: CHEST - 2 VIEW COMPARISON:  Chest x-ray 05/08/2021. FINDINGS: The heart size and mediastinal contours are within normal limits. Both lungs are clear. No visible pleural effusions or pneumothorax. No acute osseous abnormality. IMPRESSION: No evidence of acute cardiopulmonary disease. Electronically Signed   By: FMargaretha SheffieldM.D.   On: 01/28/2022 10:53

## 2022-01-30 NOTE — Progress Notes (Signed)
Pt set up on CPAP auto titrate with full face mask.  3 lpm of oxygen bled in.  Tolerating well.

## 2022-01-30 NOTE — Progress Notes (Addendum)
Progress Note  Patient Name: Alexis [name] Date of Encounter: 01/30/2022  [facility] Cardiologist: None   Subjective   Still complaining of chest pressure  Inpatient Medications    Scheduled Meds:  aspirin  81 mg Oral Daily   atorvastatin  20 mg Oral Daily   carvedilol  6.25 mg Oral BID WC   DULoxetine  60 mg Oral QHS   enoxaparin (LOVENOX) injection  40 mg Subcutaneous Q24H   insulin aspart  0-5 Units Subcutaneous QHS   insulin aspart  0-9 Units Subcutaneous TID WC   losartan  25 mg Oral Daily   mometasone-formoterol  2 puff Inhalation BID   oxybutynin  15 mg Oral Daily   pantoprazole  40 mg Oral Daily   pregabalin  75 mg Oral BID   thyroid  120 mg Oral QAC breakfast   Continuous Infusions:  sodium chloride     PRN Meds: acetaminophen, baclofen, hydrALAZINE, naLOXone (NARCAN)  injection, ondansetron (ZOFRAN) IV, mouth rinse, oxycodone, promethazine   Vital Signs    Vitals:   01/30/22 0000 01/30/22 0006 01/30/22 0010 01/30/22 0628  BP:  (!) 149/77 (!) 149/77 (!) 160/88  Pulse: 77 70 87 81  Resp:  '16 18 16  '$ Temp:  98.7 F (37.1 C)  98.8 F (37.1 C)  TempSrc:  Oral  Oral  SpO2: 98% (!) 87% 98% 96%  Weight:      Height:        Intake/Output Summary (Last 24 hours) at 01/30/2022 0739 Last data filed at 01/29/2022 1521 Gross per 24 hour  Intake 230 ml  Output --  Net 230 ml       01/28/2022   10:28 AM 05/08/2021    7:20 PM 04/09/2021    1:37 PM  Last 3 Weights  Weight (lbs) 224 lb 216 lb 216 lb  Weight (kg) 101.606 kg 97.977 kg 97.977 kg      Telemetry    Normal sinus rhythm- Personally Reviewed  ECG    New EKG to review- Personally Reviewed  Physical Exam   GEN: Well nourished, well developed in no acute distress HEENT: Normal NECK: No JVD; No carotid bruits LYMPHATICS: No lymphadenopathy CARDIAC:RRR, no murmurs, rubs, gallops RESPIRATORY:  Clear to auscultation without rales, wheezing or rhonchi  ABDOMEN: Soft, non-tender,  non-distended MUSCULOSKELETAL:  No edema; No deformity  SKIN: Warm and dry NEUROLOGIC:  Alert and oriented x 3 PSYCHIATRIC:  Normal affect   Labs    High Sensitivity Troponin:   Recent Labs  Lab 01/28/22 1045 01/28/22 1618  TROPONINIHS 5 4       Chemistry Recent Labs  Lab 01/28/22 1045 01/29/22 0258  NA 139 140  K 3.3* 4.2  CL 100 101  CO2 31 26  GLUCOSE 121* 117*  BUN 6 8  CREATININE 0.93 1.22*  CALCIUM 9.6 9.5  PROT 7.8  --   ALBUMIN 4.0  --   AST 18  --   ALT 15  --   ALKPHOS 113  --   BILITOT 0.7  --   GFRNONAA >60 52*  ANIONGAP 8 13      Hematology Recent Labs  Lab 01/28/22 1045  WBC 11.2*  RBC 4.65  HGB 13.4  HCT 40.8  MCV 87.7  MCH 28.8  MCHC 32.8  RDW 13.2  PLT 411*     BNP Recent Labs  Lab 01/28/22 1208  BNP 6.5      DDimer  Recent Labs  Lab 01/28/22 1127  DDIMER 0.79*      CHA2DS2-VASc Score =     This indicates a  % annual risk of stroke. The patient's score is based upon:        Radiology    ECHOCARDIOGRAM COMPLETE  Result Date: 01/29/2022    ECHOCARDIOGRAM REPORT   Patient Name:   Alexis [name] Date of Exam: 01/29/2022 Medical Rec #:  [id number]        Height:       64.0 in Accession #:    [id number]       Weight:       224.0 lb Date of Birth:  10-24-1965       BSA:          2.053 m Patient Age:    30 years         BP:           101/63 mmHg Patient Gender: F                HR:           63 bpm. Exam Location:  Inpatient Procedure: 2D Echo, 3D Echo, Cardiac Doppler and Color Doppler Indications:    R07.9* Chest pain, unspecified  History:        Patient has prior history of Echocardiogram examinations, most                 recent 02/12/2021. Signs/Symptoms:Dyspnea, Shortness of Breath,                 Chest Pain and Dizziness/Lightheadedness; Risk                 Factors:Hypertension, Dyslipidemia and Diabetes.  Sonographer:    Ronny Flurry Sonographer#2:  Roseanna Rainbow RDCS Referring Phys: Graylin Shiver [facility]   Sonographer Comments: Technically difficult study due to poor echo windows and patient is obese. Image acquisition challenging due to patient body habitus. IMPRESSIONS  1. Left ventricular ejection fraction, by estimation, is 60 to 65%. The left ventricle has normal function. The left ventricle has no regional wall motion abnormalities. There is mild left ventricular hypertrophy. Left ventricular diastolic parameters are consistent with Grade I diastolic dysfunction (impaired relaxation).  2. Right ventricular systolic function is low normal. The right ventricular size is normal.  3. The mitral valve is abnormal. Trivial mitral valve regurgitation.  4. The aortic valve is tricuspid. Aortic valve regurgitation is not visualized. Aortic valve sclerosis is present, with no evidence of aortic valve stenosis.  5. The inferior vena cava is normal in size with greater than 50% respiratory variability, suggesting right atrial pressure of 3 mmHg.  6. Cannot exclude a small PFO. Comparison(s): No significant change from prior study. 02/12/2021: LVEF 60-65%. FINDINGS  Left Ventricle: Left ventricular ejection fraction, by estimation, is 60 to 65%. The left ventricle has normal function. The left ventricle has no regional wall motion abnormalities. The left ventricular internal cavity size was normal in size. There is  mild left ventricular hypertrophy. Left ventricular diastolic parameters are consistent with Grade I diastolic dysfunction (impaired relaxation). Indeterminate filling pressures. Right Ventricle: The right ventricular size is normal. No increase in right ventricular wall thickness. Right ventricular systolic function is low normal. Left Atrium: Left atrial size was normal in size. Right Atrium: Right atrial size was normal in size. Pericardium: There is no evidence of pericardial effusion. Mitral Valve: The mitral valve is abnormal. Mild mitral annular calcification. Trivial mitral valve regurgitation. Tricuspid  Valve: The tricuspid  valve is grossly normal. Tricuspid valve regurgitation is trivial. Aortic Valve: The aortic valve is tricuspid. Aortic valve regurgitation is not visualized. Aortic valve sclerosis is present, with no evidence of aortic valve stenosis. Pulmonic Valve: The pulmonic valve was normal in structure. Pulmonic valve regurgitation is not visualized. Aorta: The aortic root and ascending aorta are structurally normal, with no evidence of dilitation. Venous: The inferior vena cava is normal in size with greater than 50% respiratory variability, suggesting right atrial pressure of 3 mmHg. IAS/Shunts: Cannot exclude a small PFO.  LEFT VENTRICLE PLAX 2D LVIDd:         3.40 cm     Diastology LVIDs:         2.40 cm     LV e' medial:    4.95 cm/s LV PW:         1.30 cm     LV E/e' medial:  13.0 LV IVS:        1.00 cm     LV e' lateral:   9.23 cm/s LVOT diam:     2.00 cm     LV E/e' lateral: 7.0 LV SV:         47 LV SV Index:   23 LVOT Area:     3.14 cm                             3D Volume EF: LV Volumes (MOD)           3D EF:        59 % LV vol d, MOD A2C: 75.9 ml LV EDV:       222 ml LV vol d, MOD A4C: 66.7 ml LV ESV:       91 ml LV vol s, MOD A2C: 27.8 ml LV SV:        131 ml LV vol s, MOD A4C: 29.5 ml LV SV MOD A2C:     48.1 ml LV SV MOD A4C:     66.7 ml LV SV MOD BP:      40.7 ml RIGHT VENTRICLE            IVC RV S prime:     9.95 cm/s  IVC diam: 1.30 cm TAPSE (M-mode): 1.4 cm LEFT ATRIUM             Index        RIGHT ATRIUM          Index LA diam:        2.50 cm 1.22 cm/m   RA Area:     9.35 cm LA Vol (A2C):   31.9 ml 15.54 ml/m  RA Volume:   18.20 ml 8.87 ml/m LA Vol (A4C):   38.6 ml 18.80 ml/m LA Biplane Vol: 37.3 ml 18.17 ml/m  AORTIC VALVE LVOT Vmax:   88.30 cm/s LVOT Vmean:  59.200 cm/s LVOT VTI:    0.151 m  AORTA Ao Root diam: 3.00 cm Ao Asc diam:  3.10 cm MITRAL VALVE MV Area (PHT): 5.38 cm    SHUNTS MV Decel Time: 141 msec    Systemic VTI:  0.15 m MV E velocity: 64.50 cm/s  Systemic Diam:  2.00 cm MV A velocity: 84.90 cm/s MV E/A ratio:  0.76 Lyman Bishop MD Electronically signed by Lyman Bishop MD Signature Date/Time: 01/29/2022/11:00:56 AM    Final    CT Angio Chest PE W and/or Wo Contrast  Result Date: 01/28/2022 CLINICAL  DATA:  Elevated D-dimer, diaphoresis, a little chest pain, history type II diabetes mellitus, CHF, hypertension, thyroid cancer, GERD EXAM: CT ANGIOGRAPHY CHEST WITH CONTRAST TECHNIQUE: Multidetector CT imaging of the chest was performed using the standard protocol during bolus administration of intravenous contrast. Multiplanar CT image reconstructions and MIPs were obtained to evaluate the vascular anatomy. RADIATION DOSE REDUCTION: This exam was performed according to the departmental dose-optimization program which includes automated exposure control, adjustment of the mA and/or kV according to patient size and/or use of iterative reconstruction technique. CONTRAST:  60m OMNIPAQUE IOHEXOL 350 MG/ML SOLN IV COMPARISON:  CT chest 06/11/2011 FINDINGS: Cardiovascular: Mild atherosclerotic calcifications of aorta and coronary arteries. Aorta normal caliber without aneurysm or dissection. No pericardial effusion. Heart size normal. Pulmonary arteries adequately opacified and patent. No evidence of pulmonary embolism. Mediastinum/Nodes: Esophagus unremarkable. Prior thyroidectomy. Residual thymic tissue in anterior mediastinum, unchanged. No thoracic adenopathy. Lungs/Pleura: Lungs clear. No pulmonary infiltrate, pleural effusion, or pneumothorax. Upper Abdomen: Visualized upper abdomen unremarkable. Musculoskeletal: No osseous abnormalities. Review of the MIP images confirms the above findings. IMPRESSION: No evidence of pulmonary embolism. No acute intrathoracic abnormalities. Aortic Atherosclerosis (ICD10-I70.0). Electronically Signed   By: MLavonia DanaM.D.   On: 01/28/2022 16:59   DG Knee Complete 4 Views Right  Result Date: 01/28/2022 CLINICAL DATA:  Knee pain EXAM:  RIGHT KNEE - COMPLETE 4+ VIEW COMPARISON:  None Available. FINDINGS: Normal alignment no fracture. Small joint effusion. Loose body in the joint posteriorly. Mild joint space narrowing and spurring medially. Moderate degenerative change in the patellofemoral joint with joint space narrowing and spurring. Lateral joint space intact. IMPRESSION: Degenerative change medially and in the patellofemoral joint. Calcified loose body in the knee joint posteriorly. Electronically Signed   By: CFranchot GalloM.D.   On: 01/28/2022 12:04   DG Chest 2 View  Result Date: 01/28/2022 CLINICAL DATA:  Chest pain. EXAM: CHEST - 2 VIEW COMPARISON:  Chest x-ray 05/08/2021. FINDINGS: The heart size and mediastinal contours are within normal limits. Both lungs are clear. No visible pleural effusions or pneumothorax. No acute osseous abnormality. IMPRESSION: No evidence of acute cardiopulmonary disease. Electronically Signed   By: FMargaretha SheffieldM.D.   On: 01/28/2022 10:53    Cardiac Studies   2D echo 02/08/22 IMPRESSIONS   1. Left ventricular ejection fraction, by estimation, is 60 to 65%. The  left ventricle has normal function. The left ventricle has no regional  wall motion abnormalities. There is mild left ventricular hypertrophy.  Left ventricular diastolic parameters  are consistent with Grade I diastolic dysfunction (impaired relaxation).   2. Right ventricular systolic function is low normal. The right  ventricular size is normal.   3. The mitral valve is abnormal. Trivial mitral valve regurgitation.   4. The aortic valve is tricuspid. Aortic valve regurgitation is not  visualized. Aortic valve sclerosis is present, with no evidence of aortic  valve stenosis.   5. The inferior vena cava is normal in size with greater than 50%  respiratory variability, suggesting right atrial pressure of 3 mmHg.   6. Cannot exclude a small PFO.   Comparison(s): No significant change from prior study. 02/12/2021: LVEF   60-65%.   Patient Profile     56y.o. female  with a hx of hypertension, diabetes mellitus, hyperlipidemia, ASD defect, and morbid obesity who is being seen 01/28/2022 for the evaluation of dyspnea on exertion at the request of Dr. SCandiss Norse  Assessment & Plan    Atypical chest pain /dyspnea on  exertion/diaphoresis -Patient chronic dyspnea on exertion with extensive work-up by Dr. Einar Gip as above.  Since then followed at [facility].  Reportedly having abnormal stress test but no work-up done afterwards.  Patient has chronic abnormal EKG with repolarization/T wave abnormality.  She lives sedentary lifestyle.  Lives at [employer] [facility]. -Now worsening dyspnea on exertion with episodic diaphoretic episode with and without chest pain and palpitations.  -Troponin normal -BNP normal  -D-dimer minimally elevated.  Chest CT negative for PE but did show coronary calcifications  -2D echo with normal LV function EF 60 to 65% with no focal wall motion abnormalities -She is n.p.o. for coronary CTA today to define coronary anatomy  HTN -BP remains elevated today -Stopped lisinopril and started losartan 25 mg daily -Stopped verapamil and added carvedilol 6.25 mg twice daily -Carvedilol is on hold today and she is going to get Lopressor 50 mg this morning for her coronary CTA -Continue losartan 25 mg daily and continue carvedilol 6.'25mg'$  BID after coronary CTA>> we will continue to titrate these medications as needed for blood pressure control  Palpitations -No arrhythmias on telemetry -We will defer to her cardiologist at [place]  Hyperlipidemia -Lipids checked this admission showing an LDL of 124, HDL 41 and triglycerides 128 -She was advised to be on atorvastatin 20 mg daily but apparently was not taking this -will restart atorvastatin 20 mg daily and repeat FLP and ALT in 6 weeks     I have spent a total of 30 minutes with patient reviewing 2D echo , telemetry, EKGs, labs and examining  patient as well as establishing an assessment and plan that was discussed with the patient.  > 50% of time was spent in direct patient care.     For questions or updates, please contact [place] Please consult [email or web address] for contact info under        Signed, Fransico Him, MD  01/30/2022, 7:39 AM

## 2022-01-30 NOTE — Consult Note (Signed)
Reason for Consult:Right knee pain Referring Physician: Lala Lund Time called: 1610 Time at bedside: Alexis [name] is an 56 y.o. female.  HPI: Alexis [name] was getting up from the toilet ~10d ago when she felt a pop in her right knee. This was accompanied by significant pain and it quickly swelled. It's gotten somewhat better since then but she is still unable to bear more than minimum weight. She tried a small knee brace (sounds like a patellar knee strap) but that didn't seem to help.  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[place])    "left"/CT (05/17/2017)   Anemia    ASD (atrial septal defect)    per pt   Asthma    Bilateral dry eyes    Cervical spondylosis    C5-6   CHF (congestive heart failure) (HCC)    not sure   Chronic back pain    Chronic bronchitis (HCC)    Chronic colitis    Chronic lower GI bleeding    Complication of anesthesia    has awakened in past during surgery/  LEFT VOCAL CORD PARALYSIS  POST TOTAL THYROIDECTOMY  03-08-2009 ([facility])   Degenerative disk disease    "cervical to sacrum" (05/17/2017)   Depression    Diabetes mellitus without complication (HCC)    Diabetic neuropathy, type II diabetes mellitus ([place]) 12/14/2014   not on medication   DJD (degenerative joint disease) of knee    Dyspnea    Endometriosis 01/18/01   Family history of adverse reaction to anesthesia    "mom wakes up during procedures"   Fatty liver    Fibromyalgia    Gastric ulcer    GERD (gastroesophageal reflux disease)    Glaucoma of both eyes    H/O hiatal hernia    High cholesterol    History of colon polyps    Hypertension    Hypothyroidism, postsurgical    IBS (irritable bowel syndrome)    IC (interstitial cystitis)    Interstitial cystitis    Lymphedema    Migraine    "monthly" (05/17/2017)  02/06/2019- more frequently   OSA on CPAP    Osteoarthritis    "all over; mainly knees and back" (05/17/2017)   PONV (postoperative nausea  and vomiting)    PTSD (post-traumatic stress disorder)    PTSD (post-traumatic stress disorder)    Sickle cell trait ([place])    Sjogren's syndrome ([place])    Thyroid cancer ([place])    Tremor 12/14/2014   hand   Uterine fibroid    Varicose veins    Vestibular dizziness    Vocal cord paralysis, unilateral complete    "left";  POST TOTAL THYROIDECTOMY    Past Surgical History:  Procedure Laterality Date   ACHILLES TENDON REPAIR Left 2007   BUNIONECTOMY WITH HAMMERTOE RECONSTRUCTION AND GASTROC SLIDE Left FEB  2011   REMOVAL HARDWARE  NOV  2011   CARDIAC CATHETERIZATION N/A 10/28/2015   Procedure: Right Heart Cath;  Surgeon: Adrian Prows, MD;  Location: [facility];  Service: Cardiovascular;  Laterality: N/A;   COLONOSCOPY WITH ESOPHAGOGASTRODUODENOSCOPY (EGD)  MULTIPLE --  LAST ONE   NOV  2014   CYSTO/  BILATERAL RETROGRADE PYELOGRAM/  HYDRODISTENTION/  INSTILLATION THERAPY  05-08-2003   CYSTO/  HYDRODISTENTION/  INSTILLATION THERAPY  06-04-2005  &  03-04-2007   CYSTO/  URETHRAL DILATION /  BILATERAL UNROOFING URETEROCELE/  BILATERAL URETERAL STENT PLACEMENT  12-01-2002   D &  C HYSTEROSCOPY/  REMOVAL IUD  12-06-2003   DE QUERVAIN'S RELEASE Left ?09/2005   DIAGNOSTIC LAPAROSCOPY     DILATATION AND CURETTAGE WITH SUCTION  05-29-2005   RETAINED PLACENTA   DILATION AND CURETTAGE OF UTERUS     DX LAPAROSCOPY/  PELVIC BX'S/  LYSIS ADHESIONS  06-26-2000   FOOT HARDWARE REMOVAL Left 03/2010   REMOVAL HARDWARE "related to earlier hammertoe OR"   HYDRADENITIS EXCISION  06/29/2011   Procedure: EXCISION HYDRADENITIS AXILLA;  Surgeon: Hermelinda Dellen, MD;  Location: [facility];  Service: Plastics;;  right breast hydradenitis excioion   HYDRADENITIS EXCISION Left 2000   INCISION AND DRAINAGE ABSCESS ANAL  02-14-2001   KNEE ARTHROSCOPY WITH LATERAL MENISECTOMY Right 09/08/2013   Procedure: RIGHT KNEE ARTHROSCOPY PARTIAL LATERAL MENISCECTOMY AND DEBRIDEMENT ;  Surgeon: Johnn Hai, MD;  Location: [facility];  Service: Orthopedics;  Laterality: Right;   LAPAROSCOPY ABDOMEN DIAGNOSTIC  SEPT  2003   [place]   AND PLACEMENT IUD   MASS EXCISION  10/29/2003   WIDE EXCISION CHEST AREA   PILONIDAL CYST EXCISION  1983   RADIOLOGY WITH ANESTHESIA N/A 02/07/2019   Procedure: MRI BRAIN WITHOUT IV CONTRAST; MRI CERVICAL, THORACIC, AND LUMBAR SPINE WITH AND WITHOUT CONTRAST;  Surgeon: Radiologist, Medication, MD;  Location: [facility];  Service: Radiology;  Laterality: N/A;   RADIOLOGY WITH ANESTHESIA N/A 02/15/2020   Procedure: MRI LUMBAR WITHOUT CONTRAST;  Surgeon: Radiologist, Medication, MD;  Location: [facility];  Service: Radiology;  Laterality: N/A;   REDUCTION MAMMAPLASTY  1997   TEE WITHOUT CARDIOVERSION N/A 09/26/2015   Procedure: TRANSESOPHAGEAL ECHOCARDIOGRAM (TEE);  Surgeon: Adrian Prows, MD;  Location: [facility];  Service: Cardiovascular;  Laterality: N/A;   TEMPORAL ARTERY BIOPSY / LIGATION Left 2010   TEMPORAL ARTERY BIOPSY / LIGATION Right 06-16-2002   TONSILLECTOMY  1990   TOTAL THYROIDECTOMY  11/06/2008    Family History  Problem Relation Age of Onset   Atrial fibrillation Mother    Diabetes Mother    Thyroid disease Mother    Cushing syndrome Mother    Tremor Mother    Atrial fibrillation Father    Glaucoma Father    Tremor Sister    Cancer Paternal Aunt        breast, colon    Social History:  reports that she has never smoked. She has never used smokeless tobacco. She reports that she does not currently use alcohol. She reports that she does not use drugs.  Allergies:  Allergies  Allergen Reactions   Food Nausea And Vomiting    Raw pulp in fruit- can eat cooked.   Other Nausea And Vomiting and Other (See Comments)    N/V - Enteric Coating   Adhesive [Tape] Other (See Comments)    Skin irritation   Bactrim Nausea And Vomiting and Other (See Comments)    Abdominal pain   Clarithromycin Nausea And Vomiting   Ibuprofen Nausea And  Vomiting and Other (See Comments)    Severe stomach pain- can take IV with no issues   Meclizine Other (See Comments)    Hallucinations, dizziness   Metformin Hcl Diarrhea and Nausea And Vomiting   Nexium [Esomeprazole] Other (See Comments)    Cramps and headaches   Nsaids Other (See Comments)    Avoids due to GERD   Peg 3350-Electrolytes Other (See Comments)    Aspiration    Sulfa Antibiotics Nausea And Vomiting and Other (See Comments)    No issue  with IV    Tisagenlecleucel Other (See Comments)    Unknown reaction    Tolmetin Other (See Comments)    Avoid due to GERD   Topiramate Other (See Comments)    Dizziness   Trimethoprim Nausea And Vomiting   Latex Itching and Rash    Medications: I have reviewed the patient's current medications.  Results for orders placed or performed during the hospital encounter of 01/28/22 (from the past 48 hour(s))  Basic metabolic panel     Status: Abnormal   Collection Time: 01/28/22 10:45 AM  Result Value Ref Range   Sodium 139 135 - 145 mmol/L   Potassium 3.3 (L) 3.5 - 5.1 mmol/L   Chloride 100 98 - 111 mmol/L   CO2 31 22 - 32 mmol/L   Glucose, Bld 121 (H) 70 - 99 mg/dL    Comment: Glucose reference range applies only to samples taken after fasting for at least 8 hours.   BUN 6 6 - 20 mg/dL   Creatinine, Ser 0.93 0.44 - 1.00 mg/dL   Calcium 9.6 8.9 - 10.3 mg/dL   GFR, Estimated >60 >60 mL/min    Comment: (NOTE) Calculated using the CKD-EPI Creatinine Equation (2021)    Anion gap 8 5 - 15    Comment: Performed at [facility] [address], [place], [place] [address]  CBC     Status: Abnormal   Collection Time: 01/28/22 10:45 AM  Result Value Ref Range   WBC 11.2 (H) 4.0 - 10.5 K/uL   RBC 4.65 3.87 - 5.11 MIL/uL   Hemoglobin 13.4 12.0 - 15.0 g/dL   HCT 40.8 36.0 - 46.0 %   MCV 87.7 80.0 - 100.0 fL   MCH 28.8 26.0 - 34.0 pg   MCHC 32.8 30.0 - 36.0 g/dL   RDW 13.2 11.5 - 15.5 %   Platelets 411 (H) 150 - 400 K/uL   nRBC  0.0 0.0 - 0.2 %    Comment: Performed at [facility], [place] [address], [place], [place] [address]  Hepatic function panel     Status: None   Collection Time: 01/28/22 10:45 AM  Result Value Ref Range   Total Protein 7.8 6.5 - 8.1 g/dL   Albumin 4.0 3.5 - 5.0 g/dL   AST 18 15 - 41 U/L   ALT 15 0 - 44 U/L   Alkaline Phosphatase 113 38 - 126 U/L   Total Bilirubin 0.7 0.3 - 1.2 mg/dL   Bilirubin, Direct <0.1 0.0 - 0.2 mg/dL   Indirect Bilirubin NOT CALCULATED 0.3 - 0.9 mg/dL    Comment: Performed at [facility] [address] [place], [place] [address]  Troponin I (High Sensitivity)     Status: None   Collection Time: 01/28/22 10:45 AM  Result Value Ref Range   Troponin I (High Sensitivity) 5 <18 ng/L    Comment: (NOTE) Elevated high sensitivity troponin I (hsTnI) values and significant  changes across serial measurements may suggest ACS but many other  chronic and acute conditions are known to elevate hsTnI results.  Refer to the Links section for chest pain algorithms and additional  guidance. Performed at [facility], [place] [address], [place], [place] [address]   TSH     Status: None   Collection Time: 01/28/22 10:45 AM  Result Value Ref Range   TSH 2.323 0.350 - 4.500 uIU/mL    Comment: Performed by a 3rd Generation assay with a functional sensitivity  of <=0.01 uIU/mL. Performed at [facility], [place] [address], [place], [place] [address]   D-dimer, quantitative     Status: Abnormal   Collection Time: 01/28/22 11:27 AM  Result Value Ref Range   D-Dimer, Quant 0.79 (H) 0.00 - 0.50 ug/mL-FEU    Comment: (NOTE) At the manufacturer cut-off value of 0.5 g/mL FEU, this assay has a negative predictive value of 95-100%.This assay is intended for use in conjunction with a clinical pretest probability (PTP) assessment model to exclude pulmonary embolism (PE) and deep venous thrombosis (DVT) in outpatients suspected of PE or DVT. Results should be  correlated with clinical presentation. Performed at [facility], [place] [address], [place], [place] [address]   Brain natriuretic peptide     Status: None   Collection Time: 01/28/22 12:08 PM  Result Value Ref Range   B Natriuretic Peptide 6.5 0.0 - 100.0 pg/mL    Comment: Performed at [facility] [address], [place], [place] [address]  Hemoglobin A1c     Status: Abnormal   Collection Time: 01/28/22  4:18 PM  Result Value Ref Range   Hgb A1c MFr Bld 6.3 (H) 4.8 - 5.6 %    Comment: (NOTE) Pre diabetes:          5.7%-6.4%  Diabetes:              >6.4%  Glycemic control for   <7.0% adults with diabetes    Mean Plasma Glucose 134.11 mg/dL    Comment: Performed at [facility] [address], [place], [place] [address]  HIV Antibody (routine testing w rflx)     Status: None   Collection Time: 01/28/22  4:18 PM  Result Value Ref Range   HIV Screen 4th Generation wRfx Non Reactive Non Reactive    Comment: Performed at [facility], [place] [address] [place], [place] [address]  Troponin I (High Sensitivity)     Status: None   Collection Time: 01/28/22  4:18 PM  Result Value Ref Range   Troponin I (High Sensitivity) 4 <18 ng/L    Comment: (NOTE) Elevated high sensitivity troponin I (hsTnI) values and significant  changes across serial measurements may suggest ACS but many other  chronic and acute conditions are known to elevate hsTnI results.  Refer to the "Links" section for chest pain algorithms and additional  guidance. Performed at [facility], [place] [address] [place], [place] [address]   CBG monitoring, ED     Status: Abnormal   Collection Time: 01/28/22  5:38 PM  Result Value Ref Range   Glucose-Capillary 116 (H) 70 - 99 mg/dL    Comment: Glucose reference range applies only to samples taken after fasting for at least 8 hours.  Glucose, capillary     Status: Abnormal   Collection Time: 01/28/22 11:00 PM  Result Value Ref Range    Glucose-Capillary 102 (H) 70 - 99 mg/dL    Comment: Glucose reference range applies only to samples taken after fasting for at least 8 hours.  Urinalysis, Routine w reflex microscopic Urine, Clean Catch     Status: Abnormal   Collection Time: 01/29/22 12:30 AM  Result Value Ref Range   Color, Urine YELLOW YELLOW   APPearance CLEAR CLEAR   Specific Gravity, Urine 1.025 1.005 - 1.030   pH 7.0 5.0 - 8.0   Glucose, UA NEGATIVE NEGATIVE mg/dL   Hgb urine dipstick SMALL (A) NEGATIVE   Bilirubin  Urine NEGATIVE NEGATIVE   Ketones, ur NEGATIVE NEGATIVE mg/dL   Protein, ur NEGATIVE NEGATIVE mg/dL   Nitrite NEGATIVE NEGATIVE   Leukocytes,Ua LARGE (A) NEGATIVE   RBC / HPF 0-5 0 - 5 RBC/hpf   WBC, UA 0-5 0 - 5 WBC/hpf   Bacteria, UA RARE (A) NONE SEEN   Squamous Epithelial / LPF 0-5 0 - 5    Comment: Performed at [facility], [place] [address] [place], [place] [address]  Lipid panel     Status: Abnormal   Collection Time: 01/29/22  2:58 AM  Result Value Ref Range   Cholesterol 191 0 - 200 mg/dL   Triglycerides 128 <150 mg/dL   HDL 41 >40 mg/dL   Total CHOL/HDL Ratio 4.7 RATIO   VLDL 26 0 - 40 mg/dL   LDL Cholesterol 124 (H) 0 - 99 mg/dL    Comment:        Total Cholesterol/HDL:CHD Risk Coronary Heart Disease Risk Table                     Men   Women  1/2 Average Risk   3.4   3.3  Average Risk       5.0   4.4  2 X Average Risk   9.6   7.1  3 X Average Risk  23.4   11.0        Use the calculated Patient Ratio above and the CHD Risk Table to determine the patient's CHD Risk.        ATP III CLASSIFICATION (LDL):  <100     mg/dL   Optimal  100-129  mg/dL   Near or Above                    Optimal  130-159  mg/dL   Borderline  160-189  mg/dL   High  >190     mg/dL   Very High Performed at [facility] [address] [place], [place] [address]   Basic metabolic panel     Status: Abnormal   Collection Time: 01/29/22  2:58 AM  Result Value Ref Range   Sodium 140 135 -  145 mmol/L   Potassium 4.2 3.5 - 5.1 mmol/L   Chloride 101 98 - 111 mmol/L   CO2 26 22 - 32 mmol/L   Glucose, Bld 117 (H) 70 - 99 mg/dL    Comment: Glucose reference range applies only to samples taken after fasting for at least 8 hours.   BUN 8 6 - 20 mg/dL   Creatinine, Ser 1.22 (H) 0.44 - 1.00 mg/dL   Calcium 9.5 8.9 - 10.3 mg/dL   GFR, Estimated 52 (L) >60 mL/min    Comment: (NOTE) Calculated using the CKD-EPI Creatinine Equation (2021)    Anion gap 13 5 - 15    Comment: Performed at [facility] [address], [place], [place] [address]  Glucose, capillary     Status: Abnormal   Collection Time: 01/29/22  8:05 AM  Result Value Ref Range   Glucose-Capillary 132 (H) 70 - 99 mg/dL    Comment: Glucose reference range applies only to samples taken after fasting for at least 8 hours.  Glucose, capillary     Status: Abnormal   Collection Time: 01/29/22 11:46 AM  Result Value Ref Range   Glucose-Capillary 131 (H) 70 - 99 mg/dL    Comment: Glucose reference range applies only to samples taken after fasting  for at least 8 hours.  Glucose, capillary     Status: Abnormal   Collection Time: 01/29/22  3:42 PM  Result Value Ref Range   Glucose-Capillary 116 (H) 70 - 99 mg/dL    Comment: Glucose reference range applies only to samples taken after fasting for at least 8 hours.  Glucose, capillary     Status: Abnormal   Collection Time: 01/29/22  9:53 PM  Result Value Ref Range   Glucose-Capillary 159 (H) 70 - 99 mg/dL    Comment: Glucose reference range applies only to samples taken after fasting for at least 8 hours.  Basic metabolic panel     Status: Abnormal   Collection Time: 01/30/22  6:26 AM  Result Value Ref Range   Sodium 139 135 - 145 mmol/L   Potassium 4.6 3.5 - 5.1 mmol/L   Chloride 100 98 - 111 mmol/L   CO2 29 22 - 32 mmol/L   Glucose, Bld 116 (H) 70 - 99 mg/dL    Comment: Glucose reference range applies only to samples taken after fasting for at least 8 hours.    BUN 12 6 - 20 mg/dL   Creatinine, Ser 0.96 0.44 - 1.00 mg/dL   Calcium 9.6 8.9 - 10.3 mg/dL   GFR, Estimated >60 >60 mL/min    Comment: (NOTE) Calculated using the CKD-EPI Creatinine Equation (2021)    Anion gap 10 5 - 15    Comment: Performed at [facility] [address], [place], [place] [address]  Glucose, capillary     Status: Abnormal   Collection Time: 01/30/22  8:01 AM  Result Value Ref Range   Glucose-Capillary 134 (H) 70 - 99 mg/dL    Comment: Glucose reference range applies only to samples taken after fasting for at least 8 hours.    ECHOCARDIOGRAM COMPLETE  Result Date: 01/29/2022    ECHOCARDIOGRAM REPORT   Patient Name:   Alexis [name] Date of Exam: 01/29/2022 Medical Rec #:  [id number]        Height:       64.0 in Accession #:    [id number]       Weight:       224.0 lb Date of Birth:  1966-01-24       BSA:          2.053 m Patient Age:    29 years         BP:           101/63 mmHg Patient Gender: F                HR:           63 bpm. Exam Location:  Inpatient Procedure: 2D Echo, 3D Echo, Cardiac Doppler and Color Doppler Indications:    R07.9* Chest pain, unspecified  History:        Patient has prior history of Echocardiogram examinations, most                 recent 02/12/2021. Signs/Symptoms:Dyspnea, Shortness of Breath,                 Chest Pain and Dizziness/Lightheadedness; Risk                 Factors:Hypertension, Dyslipidemia and Diabetes.  Sonographer:    Ronny Flurry Sonographer#2:  Roseanna Rainbow RDCS Referring Phys: Graylin Shiver [facility]  Sonographer Comments: Technically difficult study due to poor echo windows and patient is obese. Image acquisition challenging due  to patient body habitus. IMPRESSIONS  1. Left ventricular ejection fraction, by estimation, is 60 to 65%. The left ventricle has normal function. The left ventricle has no regional wall motion abnormalities. There is mild left ventricular hypertrophy. Left ventricular diastolic parameters are  consistent with Grade I diastolic dysfunction (impaired relaxation).  2. Right ventricular systolic function is low normal. The right ventricular size is normal.  3. The mitral valve is abnormal. Trivial mitral valve regurgitation.  4. The aortic valve is tricuspid. Aortic valve regurgitation is not visualized. Aortic valve sclerosis is present, with no evidence of aortic valve stenosis.  5. The inferior vena cava is normal in size with greater than 50% respiratory variability, suggesting right atrial pressure of 3 mmHg.  6. Cannot exclude a small PFO. Comparison(s): No significant change from prior study. 02/12/2021: LVEF 60-65%. FINDINGS  Left Ventricle: Left ventricular ejection fraction, by estimation, is 60 to 65%. The left ventricle has normal function. The left ventricle has no regional wall motion abnormalities. The left ventricular internal cavity size was normal in size. There is  mild left ventricular hypertrophy. Left ventricular diastolic parameters are consistent with Grade I diastolic dysfunction (impaired relaxation). Indeterminate filling pressures. Right Ventricle: The right ventricular size is normal. No increase in right ventricular wall thickness. Right ventricular systolic function is low normal. Left Atrium: Left atrial size was normal in size. Right Atrium: Right atrial size was normal in size. Pericardium: There is no evidence of pericardial effusion. Mitral Valve: The mitral valve is abnormal. Mild mitral annular calcification. Trivial mitral valve regurgitation. Tricuspid Valve: The tricuspid valve is grossly normal. Tricuspid valve regurgitation is trivial. Aortic Valve: The aortic valve is tricuspid. Aortic valve regurgitation is not visualized. Aortic valve sclerosis is present, with no evidence of aortic valve stenosis. Pulmonic Valve: The pulmonic valve was normal in structure. Pulmonic valve regurgitation is not visualized. Aorta: The aortic root and ascending aorta are structurally  normal, with no evidence of dilitation. Venous: The inferior vena cava is normal in size with greater than 50% respiratory variability, suggesting right atrial pressure of 3 mmHg. IAS/Shunts: Cannot exclude a small PFO.  LEFT VENTRICLE PLAX 2D LVIDd:         3.40 cm     Diastology LVIDs:         2.40 cm     LV e' medial:    4.95 cm/s LV PW:         1.30 cm     LV E/e' medial:  13.0 LV IVS:        1.00 cm     LV e' lateral:   9.23 cm/s LVOT diam:     2.00 cm     LV E/e' lateral: 7.0 LV SV:         47 LV SV Index:   23 LVOT Area:     3.14 cm                             3D Volume EF: LV Volumes (MOD)           3D EF:        59 % LV vol d, MOD A2C: 75.9 ml LV EDV:       222 ml LV vol d, MOD A4C: 66.7 ml LV ESV:       91 ml LV vol s, MOD A2C: 27.8 ml LV SV:        131 ml  LV vol s, MOD A4C: 29.5 ml LV SV MOD A2C:     48.1 ml LV SV MOD A4C:     66.7 ml LV SV MOD BP:      40.7 ml RIGHT VENTRICLE            IVC RV S prime:     9.95 cm/s  IVC diam: 1.30 cm TAPSE (M-mode): 1.4 cm LEFT ATRIUM             Index        RIGHT ATRIUM          Index LA diam:        2.50 cm 1.22 cm/m   RA Area:     9.35 cm LA Vol (A2C):   31.9 ml 15.54 ml/m  RA Volume:   18.20 ml 8.87 ml/m LA Vol (A4C):   38.6 ml 18.80 ml/m LA Biplane Vol: 37.3 ml 18.17 ml/m  AORTIC VALVE LVOT Vmax:   88.30 cm/s LVOT Vmean:  59.200 cm/s LVOT VTI:    0.151 m  AORTA Ao Root diam: 3.00 cm Ao Asc diam:  3.10 cm MITRAL VALVE MV Area (PHT): 5.38 cm    SHUNTS MV Decel Time: 141 msec    Systemic VTI:  0.15 m MV E velocity: 64.50 cm/s  Systemic Diam: 2.00 cm MV A velocity: 84.90 cm/s MV E/A ratio:  0.76 Lyman Bishop MD Electronically signed by Lyman Bishop MD Signature Date/Time: 01/29/2022/11:00:56 AM    Final    CT Angio Chest PE W and/or Wo Contrast  Result Date: 01/28/2022 CLINICAL DATA:  Elevated D-dimer, diaphoresis, a little chest pain, history type II diabetes mellitus, CHF, hypertension, thyroid cancer, GERD EXAM: CT ANGIOGRAPHY CHEST WITH CONTRAST  TECHNIQUE: Multidetector CT imaging of the chest was performed using the standard protocol during bolus administration of intravenous contrast. Multiplanar CT image reconstructions and MIPs were obtained to evaluate the vascular anatomy. RADIATION DOSE REDUCTION: This exam was performed according to the departmental dose-optimization program which includes automated exposure control, adjustment of the mA and/or kV according to patient size and/or use of iterative reconstruction technique. CONTRAST:  2m OMNIPAQUE IOHEXOL 350 MG/ML SOLN IV COMPARISON:  CT chest 06/11/2011 FINDINGS: Cardiovascular: Mild atherosclerotic calcifications of aorta and coronary arteries. Aorta normal caliber without aneurysm or dissection. No pericardial effusion. Heart size normal. Pulmonary arteries adequately opacified and patent. No evidence of pulmonary embolism. Mediastinum/Nodes: Esophagus unremarkable. Prior thyroidectomy. Residual thymic tissue in anterior mediastinum, unchanged. No thoracic adenopathy. Lungs/Pleura: Lungs clear. No pulmonary infiltrate, pleural effusion, or pneumothorax. Upper Abdomen: Visualized upper abdomen unremarkable. Musculoskeletal: No osseous abnormalities. Review of the MIP images confirms the above findings. IMPRESSION: No evidence of pulmonary embolism. No acute intrathoracic abnormalities. Aortic Atherosclerosis (ICD10-I70.0). Electronically Signed   By: MLavonia DanaM.D.   On: 01/28/2022 16:59   DG Knee Complete 4 Views Right  Result Date: 01/28/2022 CLINICAL DATA:  Knee pain EXAM: RIGHT KNEE - COMPLETE 4+ VIEW COMPARISON:  None Available. FINDINGS: Normal alignment no fracture. Small joint effusion. Loose body in the joint posteriorly. Mild joint space narrowing and spurring medially. Moderate degenerative change in the patellofemoral joint with joint space narrowing and spurring. Lateral joint space intact. IMPRESSION: Degenerative change medially and in the patellofemoral joint. Calcified  loose body in the knee joint posteriorly. Electronically Signed   By: CFranchot GalloM.D.   On: 01/28/2022 12:04   DG Chest 2 View  Result Date: 01/28/2022 CLINICAL DATA:  Chest pain. EXAM: CHEST - 2  VIEW COMPARISON:  Chest x-ray 05/08/2021. FINDINGS: The heart size and mediastinal contours are within normal limits. Both lungs are clear. No visible pleural effusions or pneumothorax. No acute osseous abnormality. IMPRESSION: No evidence of acute cardiopulmonary disease. Electronically Signed   By: Margaretha Sheffield M.D.   On: 01/28/2022 10:53    Review of Systems  HENT:  Negative for ear discharge, ear pain, hearing loss and tinnitus.   Eyes:  Negative for photophobia and pain.  Respiratory:  Negative for cough and shortness of breath.   Cardiovascular:  Negative for chest pain.  Gastrointestinal:  Negative for abdominal pain, nausea and vomiting.  Genitourinary:  Negative for dysuria, flank pain, frequency and urgency.  Musculoskeletal:  Positive for arthralgias (Right knee). Negative for back pain, myalgias and neck pain.  Neurological:  Negative for dizziness and headaches.  Hematological:  Does not bruise/bleed easily.  Psychiatric/Behavioral:  The patient is not nervous/anxious.    Blood pressure (!) 128/46, pulse 67, temperature 98.6 F (37 C), temperature source Oral, resp. rate 18, height '5\' 4"'$  (1.626 m), weight 101.6 kg, last menstrual period 02/04/2018, SpO2 97 %. Physical Exam Constitutional:      General: She is not in acute distress.    Appearance: She is well-developed. She is not diaphoretic.  HENT:     Head: Normocephalic and atraumatic.  Eyes:     General: No scleral icterus.       Right eye: No discharge.        Left eye: No discharge.     Conjunctiva/sclera: Conjunctivae normal.  Cardiovascular:     Rate and Rhythm: Normal rate and regular rhythm.  Pulmonary:     Effort: Pulmonary effort is normal. No respiratory distress.  Musculoskeletal:     Cervical back:  Normal range of motion.     Comments: RLE No traumatic wounds, ecchymosis, or rash  Mod TTP lateral knee  Mild knee effusion  Knee stable to varus/ valgus and anterior/posterior stress  Sens DPN, SPN, TN intact  Motor EHL, ext, flex, evers 5/5  DP 2+, PT 0, No significant edema  Skin:    General: Skin is warm and dry.  Neurological:     Mental Status: She is alert.  Psychiatric:        Mood and Affect: Mood normal.        Behavior: Behavior normal.     Assessment/Plan: Right knee pain -- Will get MRI and hinged knee brace. WBAT in brace for now.    Lisette Abu, PA-C Orthopedic Surgery [phone] 01/30/2022, 9:12 AM

## 2022-01-30 NOTE — Progress Notes (Signed)
Orthopedic Tech Progress Note Patient Details:  Alexis [name] [birth year] [id number] Called in order to [employer] for Rom knee brace Patient ID: [name], female   DOB: 1966/03/23, 56 y.o.   MRN: [id number]  Chip Boer 01/30/2022, 9:54 AM

## 2022-01-31 DIAGNOSIS — G8929 Other chronic pain: Secondary | ICD-10-CM | POA: Diagnosis not present

## 2022-01-31 LAB — GLUCOSE, CAPILLARY
Glucose-Capillary: 104 mg/dL — ABNORMAL HIGH (ref 70–99)
Glucose-Capillary: 128 mg/dL — ABNORMAL HIGH (ref 70–99)
Glucose-Capillary: 130 mg/dL — ABNORMAL HIGH (ref 70–99)

## 2022-01-31 MED ORDER — OXYBUTYNIN CHLORIDE ER 15 MG PO TB24: 15.0000 mg | ORAL_TABLET | Freq: Every day | ORAL | 0 refills | Status: AC

## 2022-01-31 MED ORDER — ATORVASTATIN CALCIUM 40 MG PO TABS: 40.0000 mg | ORAL_TABLET | Freq: Every day | ORAL | 0 refills | Status: AC

## 2022-01-31 MED ORDER — ASPIRIN 81 MG PO CHEW: 81.0000 mg | CHEWABLE_TABLET | Freq: Every day | ORAL | 0 refills | Status: DC

## 2022-01-31 MED ORDER — DICLOFENAC SODIUM 1 % EX GEL
2.0000 g | Freq: Four times a day (QID) | CUTANEOUS | Status: DC
  Filled 2022-01-31: qty 100

## 2022-01-31 MED ORDER — LOSARTAN POTASSIUM 25 MG PO TABS: 25.0000 mg | ORAL_TABLET | Freq: Every day | ORAL | 0 refills | Status: DC

## 2022-01-31 MED ORDER — ATORVASTATIN CALCIUM 40 MG PO TABS
40.0000 mg | ORAL_TABLET | Freq: Every day | ORAL | Status: DC
  Administered 2022-01-31: 40 mg via ORAL
  Filled 2022-01-31: qty 1

## 2022-01-31 MED ORDER — CARVEDILOL 6.25 MG PO TABS: 6.2500 mg | ORAL_TABLET | Freq: Two times a day (BID) | ORAL | 0 refills | Status: AC

## 2022-01-31 MED ORDER — DICLOFENAC SODIUM 3 % EX GEL: 1.0000 | Freq: Every day | CUTANEOUS | 0 refills | Status: AC

## 2022-01-31 NOTE — Evaluation (Signed)
Physical Therapy Evaluation Patient Details Name: Alexis [name] MRN: [id number] DOB: 14-May-1966 Today's Date: 01/31/2022  History of Present Illness  Pt is a 56 y.o. female who presented 01/28/22 with worsening DOE, atypical substernal chest pain, and diaphoresis. TTE and CTA chest stable. Pt also with R knee "pop" and subsequent pain and edema ~10 days PTA. MRI revealed small radial tear of the lateral meniscus midbody. PMH: HTN, dyslipidemia, ASD, fibromyalgia, OSA on CPAP, IBS, obesity, DM2, GERD, chronic lyphedema, history of thyroid cancer s/p thyroidectomy   Clinical Impression  Pt presents with condition above and deficits mentioned below, see PT Problem List. PTA, she was independent and living with her 46 y.o. son in a hotel. Currently, pt is demonstrating deficits in balance, R knee ROM, power, and activity tolerance, primarily being limited by her R knee pain and edema. Pt educated on brace application, stair negotiation, transfers, and use of RW. Pt able to perform bed mobility, transfers, and ambulate with a RW without LOB or need for physical assistance. Provided HEP handout to address R knee ROM and strength deficits. No PT follow-up at this time, but pt plans for PT follow-up after potential surgery. Will continue to follow acutely.       Recommendations for follow up therapy are one component of a multi-disciplinary discharge planning process, led by the attending physician.  Recommendations may be updated based on patient status, additional functional criteria and insurance authorization.  Follow Up Recommendations No PT follow up (pt plans for follow-up PT after potential surgical intervention)      Assistance Recommended at Discharge Intermittent Supervision/Assistance  Patient can return home with the following  A little help with bathing/dressing/bathroom;Assistance with cooking/housework;Assist for transportation    Equipment Recommendations Rolling walker (2  wheels);BSC/3in1;Other (comment) (shower seat)  Recommendations for Other Services       Functional Status Assessment Patient has had a recent decline in their functional status and demonstrates the ability to make significant improvements in function in a reasonable and predictable amount of time.     Precautions / Restrictions Precautions Precautions: Fall Required Braces or Orthoses: Other Brace Other Brace: hinged knee brace Restrictions Weight Bearing Restrictions: Yes RLE Weight Bearing: Weight bearing as tolerated Other Position/Activity Restrictions: hinged knee brace      Mobility  Bed Mobility Overal bed mobility: Modified Independent             General bed mobility comments: Able to perform bed mobility without assistance, HOB elevated.    Transfers Overall transfer level: Needs assistance Equipment used: Rolling walker (2 wheels) Transfers: Sit to/from Stand Sit to Stand: Supervision           General transfer comment: Repeated cues to push up from bed and place R leg anterior to L as needed for pain relief. Demonstrated transition of hands from bed to RW. Pt able to perform with supervision and no LOB, extra time and effort though    Ambulation/Gait Ambulation/Gait assistance: Supervision Gait Distance (Feet): 200 Feet Assistive device: Rolling walker (2 wheels) Gait Pattern/deviations: Step-through pattern, Decreased stance time - right, Decreased step length - left, Decreased stride length, Antalgic Gait velocity: reduced Gait velocity interpretation: 1.31 - 2.62 ft/sec, indicative of limited community ambulator   General Gait Details: Pt with anatalgic gait pattern due to R knee pain, but able to navigate room and halls with RW and no LOB, supervision for safety  Stairs Stairs:  (discussed sequencing of feet for navigating stairs if needed`)  Wheelchair Mobility    Modified Rankin (Stroke Patients Only)       Balance Overall  balance assessment: Needs assistance Sitting-balance support: No upper extremity supported, Feet supported Sitting balance-Leahy Scale: Good     Standing balance support: No upper extremity supported, Bilateral upper extremity supported, During functional activity Standing balance-Leahy Scale: Fair Standing balance comment: Able to stand statically no UE support, bil UE support for mobility                             Pertinent Vitals/Pain Pain Assessment Pain Assessment: Faces Faces Pain Scale: Hurts little more Pain Location: R knee Pain Descriptors / Indicators: Discomfort, Guarding, Grimacing Pain Intervention(s): Limited activity within patient's tolerance, Monitored during session, Repositioned, Patient requesting pain meds-RN notified    Home Living Family/patient expects to be discharged to:: Other (Comment)                   Additional Comments: Lives in hotel with her 50 y.o. son. No other support available per pt. No stairs she needs to navigate at hotel and her room has a tub/shower combo.    Prior Function Prior Level of Function : Independent/Modified Independent;Driving                     Hand Dominance        Extremity/Trunk Assessment   Upper Extremity Assessment Upper Extremity Assessment: RUE deficits/detail RUE Deficits / Details: hx of OA and rotator cuff tear with plans for surgery soon, per pt; limited shoulder flexion/abduction AROM    Lower Extremity Assessment Lower Extremity Assessment: RLE deficits/detail RLE Deficits / Details: edema noted around knee with pain limiting weight bearing and AROM    Cervical / Trunk Assessment Cervical / Trunk Assessment: Other exceptions Cervical / Trunk Exceptions: increased body habitus  Communication   Communication: No difficulties  Cognition Arousal/Alertness: Awake/alert Behavior During Therapy: WFL for tasks assessed/performed Overall Cognitive Status: Within Functional  Limits for tasks assessed                                          General Comments General comments (skin integrity, edema, etc.): educated pt and observed pt donn brace; educated pt to not drive on pain meds; provided HEP handout consisting of mini squats, LAQ, heel slides, and prone hip extension with knee flexion    Exercises     Assessment/Plan    PT Assessment Patient needs continued PT services  PT Problem List Decreased strength;Decreased range of motion;Decreased activity tolerance;Decreased balance;Decreased mobility;Decreased knowledge of use of DME;Pain       PT Treatment Interventions DME instruction;Gait training;Stair training;Functional mobility training;Therapeutic activities;Therapeutic exercise;Balance training;Neuromuscular re-education;Patient/family education    PT Goals (Current goals can be found in the Care Plan section)  Acute Rehab PT Goals Patient Stated Goal: to heal PT Goal Formulation: With patient Time For Goal Achievement: 02/14/22 Potential to Achieve Goals: Good    Frequency Min 3X/week     Co-evaluation               AM-PAC PT "6 Clicks" Mobility  Outcome Measure Help needed turning from your back to your side while in a flat bed without using bedrails?: None Help needed moving from lying on your back to sitting on the side of a flat bed without using bedrails?:  None Help needed moving to and from a bed to a chair (including a wheelchair)?: A Little Help needed standing up from a chair using your arms (e.g., wheelchair or bedside chair)?: A Little Help needed to walk in hospital room?: A Little Help needed climbing 3-5 steps with a railing? : A Little 6 Click Score: 20    End of Session Equipment Utilized During Treatment: Gait belt Activity Tolerance: Patient tolerated treatment well Patient left: in bed;with call bell/phone within reach Nurse Communication: Mobility status PT Visit Diagnosis: Unsteadiness on  feet (R26.81);Other abnormalities of gait and mobility (R26.89);Difficulty in walking, not elsewhere classified (R26.2);Pain Pain - Right/Left: Right Pain - part of body: Knee    Time: 1419-1500 PT Time Calculation (min) (ACUTE ONLY): 41 min   Charges:   PT Evaluation $PT Eval Moderate Complexity: 1 Mod PT Treatments $Gait Training: 8-22 mins $Therapeutic Activity: 8-22 mins        Moishe Spice, PT, DPT Acute Rehabilitation Services  Office: [phone]   Orvan Falconer 01/31/2022, 3:21 PM

## 2022-01-31 NOTE — TOC Initial Note (Signed)
Transition of Care (TOC) - Initial/Assessment Note   Patient Details  Name: Alexis [name] MRN: [id number] Date of Birth: May 08, 1966  Transition of Care [facility]) CM/SW Contact:    Armida Vickroy G., RN Phone Number: 01/31/2022, 10:41 AM  Clinical Narrative:           56 y.o. 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  RNCM spoke with patient and offered choice for services. Patient with no preference, so Mardene Celeste at [employer] called with DME order, 3N1, shower stool, and RW and confirmation received.  All DME to be delivered to patient's room prior to d/c.  Patient offered [facility] services as ordered for PT/OT.  Patient declines at this time.  Patient wants to think about it and states she will notify her provider on Monday if she feels like she needs it.        Expected Discharge Plan: Home/Self Care (extended stay motel) Barriers to Discharge: No Barriers Identified  Patient Goals and CMS Choice Patient states their goals for this hospitalization and ongoing recovery are:: to return to where she stays   Choice offered to / list presented to : Patient  Expected Discharge Plan and Services Expected Discharge Plan:  Home/Self Care (extended stay motel) In-house Referral: NA Discharge Planning Services: CM Consult Post Acute Care Choice: Durable Medical Equipment Living arrangements for the past 2 months: Hotel/Motel Expected Discharge Date: 01/31/22               DME Arranged: 3-N-1, Walker rolling, Shower stool DME Agency: [employer] Date DME Agency Contacted: 01/31/22 Time DME Agency Contacted: [id number] Representative spoke with at DME Agency: Mardene Celeste HH Arranged: NA [place] Agency: NA       Prior Living Arrangements/Services Living arrangements for the past 2 months: Hotel/Motel Lives with:: Self   Do you feel safe going back to the place where you live?: Yes      Need for Family Participation in Patient Care: Yes (Comment) Care giver support system in place?: Yes (comment) Current home services:  (N/A) Criminal Activity/Legal Involvement Pertinent to Current Situation/Hospitalization: No - Comment as needed  Activities of Daily Living Home Assistive Devices/Equipment: None ADL Screening (condition at time of admission) Patient's cognitive ability adequate to safely complete daily activities?: Yes Is the patient deaf or have difficulty hearing?: No Does the patient have difficulty seeing, even when wearing glasses/contacts?: No Does the patient have difficulty concentrating, remembering, or making decisions?: No Patient able to express need for assistance with ADLs?: Yes Does the patient have difficulty dressing or bathing?: Yes Does the patient have difficulty walking or climbing stairs?: Yes Weakness of Legs: Both Weakness of Arms/Hands: Both  Permission Sought/Granted                 Emotional Assessment Appearance:: Appears stated age Attitude/Demeanor/Rapport: Gracious  Affect (typically observed): Accepting Orientation: : Oriented to Self, Oriented to Place, Oriented to  Time, Oriented to Situation   Psych Involvement: No (comment)  Admission diagnosis:  Diaphoresis  [R61] Dyspnea on exertion [R06.09] Chest pain [R07.9] Patient Active Problem List   Diagnosis Date Noted   Chest pain 01/28/2022   Essential hypertension 01/28/2022   Dyslipidemia 01/28/2022   Dizziness 10/11/2020   Colitis with rectal bleeding 05/16/2017   Chronic pain    Nausea and vomiting    Fibromyalgia 02/21/2016   Tremor 12/14/2014   Diabetic neuropathy, type II diabetes mellitus ([place]) 12/14/2014   Varicose veins of lower extremities with other complications 22/29/7989   Leg swelling 07/25/2012   SOB (shortness of breath) 02/22/2012   Chronic pelvic pain in female 06/26/2000   PCP:  Benito Mccreedy, MD Pharmacy:   [facility] [place], [address] [place] [address] Phone: [phone] Fax: [phone]  [facility] [address] [place] [address] Phone: [phone] Fax: [phone]  Social Determinants of Health (SDOH) Interventions   Readmission Risk Interventions     No data to display

## 2022-01-31 NOTE — Discharge Summary (Signed)
KLARISSA MCILVAIN UEK:[id number] DOB: 06-22-1965 DOA: 01/28/2022  PCP: Benito Mccreedy, MD  Admit date: 01/28/2022  Discharge date: 01/31/2022  Admitted From: Home   Disposition:  Home   Recommendations for Outpatient Follow-up:   Follow up with PCP in 1-2 weeks  PCP Please obtain BMP/CBC, 2 view CXR in 1week,  (see Discharge instructions)   PCP Please follow up on the following pending results: Monitor CBC, CMP, Magnesium, TSH, BP , cardiology and orthopedic follow-up to be arranged by PCP in the next 1 to 2 weeks.   Home Health: PT   Equipment/Devices: as below  Consultations: None  Discharge Condition: Stable    CODE STATUS: Full    Diet Recommendation: Heart Healthy Low Carb    Chief Complaint  Patient presents with   Excessive Sweating   Fatigue   Chest Pain   Knee Pain     Brief history of present illness from the day of admission and additional interim summary    56 y.o. 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                                                                  Hospital Course    Atypical substernal chest pressure, exertional shortness of breath and fatigue - she has atypical symptoms , seen by Cards, on Med Rx, pain free now, TTE and CTA lungs stable, chronic Diastolic CHF now stable compensated, coronary CTA shows minimal CAD, seen and cleared by cardiology for discharge and outpatient follow-up.  Aspirin, beta-blocker and high intensity statin for secondary prevention.  Symptom-free follow-up with cardiology.  PCP to kindly monitor her fluid status and any need for diuretic in the future.   2.  Essential hypertension in poor control.  Meds adjusted pressure stable.  PCP to monitor in the outpatient setting.   3.  Chronic pain and fibromyalgia.  Home medications continued.  As needed Narcan also added as she takes moderate to high doses of narcotics.   4.  Dyslipidemia.  Diet and dose adjusted for better control.   5.  Morbid obesity with  OSA.  Follow with PCP for weight loss, nighttime CPAP.   6.  Post thyroid resection hypothyroidism.  Home dose Synthroid CP to monitor TSH.   7.  Hypokalemia.  Replaced.     8.  Mild AKI - transient hypotension + BP drop, meds adjusted, post gentle IV fluids, stable now.   9. Minimally elevated D-dimer.  CTA -ve for PE.  No acute issue.  No leg swelling.   10. Acute on chronic right knee osteoarthritis pain.  X-ray nonacute, of the right knee noted with possible meniscal tear and osteoarthritis changes Case and MRI discussed with orthopedic surgeon Dr. Sammuel Hines who recommends NSAID cream with outpatient follow-up, will also add knee brace which patient already has to be worn if she wears weight.  Plan discussed with patient as well.   11. DM type II.  A1C 6.3.  Follow with PCP, low carbohydrate diet upon discharge.  Discharge diagnosis     Principal Problem:   Chest pain Active Problems:   SOB (shortness of breath)   Leg swelling   Diabetic  neuropathy, type II diabetes mellitus (HCC)   Fibromyalgia   Chronic pain   Essential hypertension   Dyslipidemia    Discharge instructions    Discharge Instructions     Discharge instructions   Complete by: As directed    Follow with Primary MD Benito Mccreedy, MD in 7 days   Get CBC, CMP, Magnesium, 2 view Chest X ray -  checked next visit within 1 week by Primary MD    Activity: As tolerated with Full fall precautions use walker/cane & assistance as needed, wear the right knee brace when you are out of the bed.  Disposition Home    Diet: Heart Healthy Low Carb  Special Instructions: If you have smoked or chewed Tobacco  in the last 2 yrs please stop smoking, stop any regular Alcohol  and or any Recreational drug use.  On your next visit with your primary care physician please Get Medicines reviewed and adjusted.  Please request your Prim.MD to go over all Hospital Tests and Procedure/Radiological results at the follow up, please get all Hospital records sent to your Prim MD by signing hospital release before you go home.  If you experience worsening of your admission symptoms, develop shortness of breath, life threatening emergency, suicidal or homicidal thoughts you must seek medical attention immediately by calling 911 or calling your MD immediately  if symptoms less severe.  You Must read complete instructions/literature along with all the possible adverse reactions/side effects for all the Medicines you take and that have been prescribed to you. Take any new Medicines after you have completely understood and accpet all the possible adverse reactions/side effects.   Increase activity slowly   Complete by: As directed        Discharge Medications   Allergies as of 01/31/2022       Reactions   Food Nausea And Vomiting   Raw pulp in fruit- can eat cooked.   Other Nausea And Vomiting, Other (See Comments)   N/V - Enteric Coating   Adhesive [tape] Other (See  Comments)   Skin irritation   Bactrim Nausea And Vomiting, Other (See Comments)   Abdominal pain   Clarithromycin Nausea And Vomiting   Ibuprofen Nausea And Vomiting, Other (See Comments)   Severe stomach pain- can take IV with no issues   Meclizine Other (See Comments)   Hallucinations, dizziness   Metformin Hcl Diarrhea, Nausea And Vomiting  Nexium [esomeprazole] Other (See Comments)   Cramps and headaches   Nsaids Other (See Comments)   Avoids due to GERD   Peg 3350-electrolytes Other (See Comments)   Aspiration    Sulfa Antibiotics Nausea And Vomiting, Other (See Comments)   No issue with IV    Tisagenlecleucel Other (See Comments)   Unknown reaction    Tolmetin Other (See Comments)   Avoid due to GERD   Topiramate Other (See Comments)   Dizziness   Trimethoprim Nausea And Vomiting   Latex Itching, Rash        Medication List     STOP taking these medications    IBUPROFEN PO   triamterene-hydrochlorothiazide 37.5-25 MG tablet Commonly known as: MAXZIDE-25       TAKE these medications    acetaminophen 500 MG tablet Commonly known as: TYLENOL Take 500 mg by mouth every 6 (six) hours as needed for headache.   albuterol 108 (90 Base) MCG/ACT inhaler Commonly known as: VENTOLIN HFA Inhale 2 puffs into the lungs every 4 (four) hours as needed for wheezing or shortness of breath.   aspirin 81 MG chewable tablet Chew 1 tablet (81 mg total) by mouth daily.   atorvastatin 40 MG tablet Commonly known as: LIPITOR Take 1 tablet (40 mg total) by mouth daily. What changed:  medication strength how much to take   Butrans 10 MCG/HR Ptwk Generic drug: buprenorphine Place 1 patch onto the skin once a week.   carvedilol 6.25 MG tablet Commonly known as: COREG Take 1 tablet (6.25 mg total) by mouth 2 (two) times daily with a meal.   Diclofenac Sodium 3 % Gel Apply 1 Application topically daily. Apply to the right knee 3 times a day. What changed: additional  instructions   DULoxetine 30 MG capsule Commonly known as: CYMBALTA Take 60 mg by mouth at bedtime.   hydroxychloroquine 200 MG tablet Commonly known as: PLAQUENIL Take 200 mg by mouth 2 (two) times daily.   Lidocaine 0.5 % Gel Apply 1 application topically 3 (three) times daily as needed (pain).   losartan 25 MG tablet Commonly known as: COZAAR Take 1 tablet (25 mg total) by mouth daily.   Lyrica 75 MG capsule Generic drug: pregabalin Take 75 mg by mouth at bedtime.   Melatonin 12 MG Tabs Take 12 mg by mouth at bedtime.   omeprazole 40 MG capsule Commonly known as: PRILOSEC Take 40 mg by mouth daily.   ondansetron 8 MG disintegrating tablet Commonly known as: ZOFRAN-ODT Take 8 mg by mouth every 8 (eight) hours as needed for nausea or vomiting.   oxybutynin 15 MG 24 hr tablet Commonly known as: DITROPAN XL Take 1 tablet (15 mg total) by mouth daily.   Oxycodone HCl 20 MG Tabs Take 20 mg by mouth every 4 (four) hours as needed for pain.   promethazine 25 MG tablet Commonly known as: PHENERGAN Take 25 mg by mouth every 6 (six) hours as needed for nausea or vomiting.   Symbicort 160-4.5 MCG/ACT inhaler Generic drug: budesonide-formoterol Inhale 1 puff into the lungs in the morning and at bedtime.   thyroid 120 MG tablet Commonly known as: ARMOUR Take 120 mg by mouth daily before breakfast.   tizanidine 6 MG capsule Commonly known as: ZANAFLEX Take 6 mg by mouth 3 (three) times daily.   Vitamin D (Ergocalciferol) 1.25 MG (50000 UNIT) Caps capsule Commonly known as: DRISDOL Take 50,000 Units by mouth every Tuesday.  Durable Medical Equipment  (From admission, onward)           Start     Ordered   01/31/22 0855  For home use only DME 3 n 1  Once        01/31/22 0854   01/31/22 0751  For home use only DME Walker rolling  Once       Comments: 5 wheel  Question Answer Comment  Walker: With Ceiba Wheels   Patient needs a walker to  treat with the following condition Weakness      01/31/22 0750             Follow-up Information     Osei-Bonsu, Iona Beard, MD. Schedule an appointment as soon as possible for a visit in 1 week(s).   Specialty: Internal Medicine Contact information: [address] [place] [address] [phone]         Sueanne Margarita, MD. Schedule an appointment as soon as possible for a visit in 2 month(s).   Specialty: Cardiology Contact information: [address] [place] [address] [phone]         Vanetta Mulders, MD. Schedule an appointment as soon as possible for a visit in 1 week(s).   Specialty: Orthopedic Surgery Why: Right knee meniscal tear Contact information: [address] [place] [address] [phone]                 Major procedures and Radiology Reports - PLEASE review detailed and final reports thoroughly  -        MR KNEE RIGHT WO CONTRAST  Result Date: 01/31/2022 CLINICAL DATA:  Acute right knee pain and swelling after feeling a pop when getting up from the toilet 10 days ago. EXAM: MRI OF THE RIGHT KNEE WITHOUT CONTRAST TECHNIQUE: Multiplanar, multisequence MR imaging of the knee was performed. No intravenous contrast was administered. COMPARISON:  Right knee x-rays dated January 28, 2022. FINDINGS: MENISCI Medial meniscus:  Intact. Lateral meniscus: Small radial tear of the midbody (series 11, image 14). LIGAMENTS Cruciates:  Intact ACL and PCL. Collaterals: Medial collateral ligament is intact. Lateral collateral ligament complex is intact. CARTILAGE Patellofemoral: High-grade partial and full-thickness cartilage loss over the patellar apex and lateral facet with underlying subchondral marrow edema and cystic change. High-grade partial-thickness cartilage loss over the trochlea. Medial: High-grade partial-thickness cartilage loss over the central weight-bearing medial femoral condyle. Lateral: 1.1 x  1.1 cm full-thickness cartilage defect over the posterior nonweightbearing lateral femoral condyle with prominent underlying subchondral marrow edema. Mild partial-thickness cartilage loss over the central weight-bearing lateral femoral condyle. Joint: Small joint effusion. 1.2 cm intra-articular body in the posterior joint space posterior to the medial gastrocnemius tendon (series 13, image 23). Normal Hoffa's fat. Popliteal Fossa:  Small Baker cyst.  Intact popliteus tendon. Extensor Mechanism: Intact quadriceps tendon and patellar tendon. Intact medial and lateral patellar retinaculum. Intact MPFL. Bones: No acute fracture or dislocation. No suspicious bone lesion. Bulky tricompartmental marginal osteophytes. Other: None. IMPRESSION: 1. Small radial tear of the lateral meniscus midbody. 2. Tricompartmental osteoarthritis, moderate in the patellofemoral compartment. 1.2 cm intra-articular body in the posterior joint space. 3. Small joint effusion and Baker cyst. Electronically Signed   By: Titus Dubin M.D.   On: 01/31/2022 08:12   CT CORONARY MORPH W/CTA COR W/SCORE W/CA W/CM &/OR WO/CM  Addendum Date: 01/30/2022   ADDENDUM REPORT: 01/30/2022 11:38 HISTORY: Chest pain/anginal equiv, intermediate CAD risk, not treadmill candidate EXAM:  Cardiac/Coronary  CT TECHNIQUE: The patient was scanned on a [employer]. PROTOCOL: A 120 kV prospective scan was triggered in the descending thoracic aorta at 111 HU's. Axial non-contrast 3 mm slices were carried out through the heart. The data set was analyzed on a dedicated work station and scored using the Agatston method. Gantry rotation speed was 250 msecs and collimation was .6 mm. Beta blockade and 0.8 mg of sl NTG was given. The 3D data set was reconstructed in 5% intervals of the 35-75 % of the R-R cycle. Systolic and diastolic phases were analyzed on a dedicated work station using MPR, MIP and VRT modes. The patient received contrast: 57m OMNIPAQUE  IOHEXOL 350 MG/ML SOLN. FINDINGS: Image quality: Good Noise artifact is: Limited Coronary calcium score is 57.2, which places the patient in the 93rd percentile for age and sex matched control. Coronary arteries: Normal coronary origins. Right dominance. Tortuous coronary arteries. Right Coronary Artery: Mild ostial atherosclerotic plaque, 25-49% stenosis. Patent PDA, PLA. Left Main Coronary Artery: Separate ostial of the LAD and LCx off the aorta, no left main segment. Left Anterior Descending Coronary Artery: Mild mixed atherosclerotic plaque in the proximal LAD 25-49% stenosis. Patent, large first diagonal branch. Left Circumflex Artery: Minimal scattered plaque in the distalmost LCx, <25% stenosis. Patent OMs. Aorta: Normal size, 30 mm at the mid ascending aorta (level of the PA bifurcation) measured double oblique. Mild aortic root calcifications. No dissection. Aortic Valve: No calcifications. Other findings: Normal pulmonary vein drainage into the left atrium. Normal left atrial appendage without thrombus. Normal size of the pulmonary artery. No evidence of PFO or ASD. IMPRESSION: 1. Mild CAD in ostial RCA and proximal LAD, 25-49%% stenosis, CADRADS 2. 2. Coronary calcium score is 57.2, which places the patient in the 93rd percentile for age and sex matched control. 3. Normal coronary origins with right dominance. 4. No evidence of atrial level shunt. RECOMMENDATIONS: CAD-RADS 2. Mild non-obstructive CAD (25-49%). Consider non-atherosclerotic causes of chest pain. Consider preventive therapy and risk factor modification. Electronically Signed   By: GCherlynn KaiserM.D.   On: 01/30/2022 11:38   Result Date: 01/30/2022 EXAM: OVER-READ INTERPRETATION  CT CHEST The following report is an over-read performed by radiologist Dr. GAletta Ed[facility]ardof [facility] [place] 01/30/2022. This over-read does not include interpretation of cardiac or coronary anatomy or pathology. The coronary CTA interpretation by the  cardiologist is attached. COMPARISON:  CT a of the chest on 01/28/2022 FINDINGS: Vascular: No significant noncardiac vascular findings. Mediastinum/Nodes: Visualized mediastinum and hilar regions demonstrate no lymphadenopathy or masses. Lungs/Pleura: Visualized lungs show no evidence of pulmonary edema, consolidation, pneumothorax, nodule or pleural fluid. Upper Abdomen: Stable hepatic steatosis. Musculoskeletal: No chest wall mass or suspicious bone lesions identified. IMPRESSION: Stable hepatic steatosis. Electronically Signed: By: GAletta EdouardM.D. On: 01/30/2022 10:11   ECHOCARDIOGRAM COMPLETE  Result Date: 01/29/2022    ECHOCARDIOGRAM REPORT   Patient Name:   VONETA SIGMANDate of Exam: 01/29/2022 Medical Rec #:  [id number]       Height:       64.0 in Accession #:    [id number]      Weight:       224.0 lb Date of Birth:  [birth year]      BSA:          2.053 m Patient Age:    89years         BP:  101/63 mmHg Patient Gender: F                HR:           63 bpm. Exam Location:  Inpatient Procedure: 2D Echo, 3D Echo, Cardiac Doppler and Color Doppler Indications:    R07.9* Chest pain, unspecified  History:        Patient has prior history of Echocardiogram examinations, most                 recent 02/12/2021. Signs/Symptoms:Dyspnea, Shortness of Breath,                 Chest Pain and Dizziness/Lightheadedness; Risk                 Factors:Hypertension, Dyslipidemia and Diabetes.  Sonographer:    Ronny Flurry Sonographer#2:  Roseanna Rainbow RDCS Referring Phys: Graylin Shiver [facility]  Sonographer Comments: Technically difficult study due to poor echo windows and patient is obese. Image acquisition challenging due to patient body habitus. IMPRESSIONS  1. Left ventricular ejection fraction, by estimation, is 60 to 65%. The left ventricle has normal function. The left ventricle has no regional wall motion abnormalities. There is mild left ventricular hypertrophy. Left ventricular diastolic  parameters are consistent with Grade I diastolic dysfunction (impaired relaxation).  2. Right ventricular systolic function is low normal. The right ventricular size is normal.  3. The mitral valve is abnormal. Trivial mitral valve regurgitation.  4. The aortic valve is tricuspid. Aortic valve regurgitation is not visualized. Aortic valve sclerosis is present, with no evidence of aortic valve stenosis.  5. The inferior vena cava is normal in size with greater than 50% respiratory variability, suggesting right atrial pressure of 3 mmHg.  6. Cannot exclude a small PFO. Comparison(s): No significant change from prior study. 02/12/2021: LVEF 60-65%. FINDINGS  Left Ventricle: Left ventricular ejection fraction, by estimation, is 60 to 65%. The left ventricle has normal function. The left ventricle has no regional wall motion abnormalities. The left ventricular internal cavity size was normal in size. There is  mild left ventricular hypertrophy. Left ventricular diastolic parameters are consistent with Grade I diastolic dysfunction (impaired relaxation). Indeterminate filling pressures. Right Ventricle: The right ventricular size is normal. No increase in right ventricular wall thickness. Right ventricular systolic function is low normal. Left Atrium: Left atrial size was normal in size. Right Atrium: Right atrial size was normal in size. Pericardium: There is no evidence of pericardial effusion. Mitral Valve: The mitral valve is abnormal. Mild mitral annular calcification. Trivial mitral valve regurgitation. Tricuspid Valve: The tricuspid valve is grossly normal. Tricuspid valve regurgitation is trivial. Aortic Valve: The aortic valve is tricuspid. Aortic valve regurgitation is not visualized. Aortic valve sclerosis is present, with no evidence of aortic valve stenosis. Pulmonic Valve: The pulmonic valve was normal in structure. Pulmonic valve regurgitation is not visualized. Aorta: The aortic root and ascending aorta are  structurally normal, with no evidence of dilitation. Venous: The inferior vena cava is normal in size with greater than 50% respiratory variability, suggesting right atrial pressure of 3 mmHg. IAS/Shunts: Cannot exclude a small PFO.  LEFT VENTRICLE PLAX 2D LVIDd:         3.40 cm     Diastology LVIDs:         2.40 cm     LV e' medial:    4.95 cm/s LV PW:         1.30 cm     LV E/e' medial:  13.0 LV IVS:        1.00 cm     LV e' lateral:   9.23 cm/s LVOT diam:     2.00 cm     LV E/e' lateral: 7.0 LV SV:         47 LV SV Index:   23 LVOT Area:     3.14 cm                             3D Volume EF: LV Volumes (MOD)           3D EF:        59 % LV vol d, MOD A2C: 75.9 ml LV EDV:       222 ml LV vol d, MOD A4C: 66.7 ml LV ESV:       91 ml LV vol s, MOD A2C: 27.8 ml LV SV:        131 ml LV vol s, MOD A4C: 29.5 ml LV SV MOD A2C:     48.1 ml LV SV MOD A4C:     66.7 ml LV SV MOD BP:      40.7 ml RIGHT VENTRICLE            IVC RV S prime:     9.95 cm/s  IVC diam: 1.30 cm TAPSE (M-mode): 1.4 cm LEFT ATRIUM             Index        RIGHT ATRIUM          Index LA diam:        2.50 cm 1.22 cm/m   RA Area:     9.35 cm LA Vol (A2C):   31.9 ml 15.54 ml/m  RA Volume:   18.20 ml 8.87 ml/m LA Vol (A4C):   38.6 ml 18.80 ml/m LA Biplane Vol: 37.3 ml 18.17 ml/m  AORTIC VALVE LVOT Vmax:   88.30 cm/s LVOT Vmean:  59.200 cm/s LVOT VTI:    0.151 m  AORTA Ao Root diam: 3.00 cm Ao Asc diam:  3.10 cm MITRAL VALVE MV Area (PHT): 5.38 cm    SHUNTS MV Decel Time: 141 msec    Systemic VTI:  0.15 m MV E velocity: 64.50 cm/s  Systemic Diam: 2.00 cm MV A velocity: 84.90 cm/s MV E/A ratio:  0.76 Lyman Bishop MD Electronically signed by Lyman Bishop MD Signature Date/Time: 01/29/2022/11:00:56 AM    Final    CT Angio Chest PE W and/or Wo Contrast  Result Date: 01/28/2022 CLINICAL DATA:  Elevated D-dimer, diaphoresis, a little chest pain, history type II diabetes mellitus, CHF, hypertension, thyroid cancer, GERD EXAM: CT ANGIOGRAPHY CHEST WITH  CONTRAST TECHNIQUE: Multidetector CT imaging of the chest was performed using the standard protocol during bolus administration of intravenous contrast. Multiplanar CT image reconstructions and MIPs were obtained to evaluate the vascular anatomy. RADIATION DOSE REDUCTION: This exam was performed according to the departmental dose-optimization program which includes automated exposure control, adjustment of the mA and/or kV according to patient size and/or use of iterative reconstruction technique. CONTRAST:  13m OMNIPAQUE IOHEXOL 350 MG/ML SOLN IV COMPARISON:  CT chest 06/11/2011 FINDINGS: Cardiovascular: Mild atherosclerotic calcifications of aorta and coronary arteries. Aorta normal caliber without aneurysm or dissection. No pericardial effusion. Heart size normal. Pulmonary arteries adequately opacified and patent. No evidence of pulmonary embolism. Mediastinum/Nodes: Esophagus unremarkable. Prior thyroidectomy. Residual thymic tissue in anterior mediastinum, unchanged. No thoracic adenopathy. Lungs/Pleura: Lungs  clear. No pulmonary infiltrate, pleural effusion, or pneumothorax. Upper Abdomen: Visualized upper abdomen unremarkable. Musculoskeletal: No osseous abnormalities. Review of the MIP images confirms the above findings. IMPRESSION: No evidence of pulmonary embolism. No acute intrathoracic abnormalities. Aortic Atherosclerosis (ICD10-I70.0). Electronically Signed   By: Lavonia Dana M.D.   On: 01/28/2022 16:59   DG Knee Complete 4 Views Right  Result Date: 01/28/2022 CLINICAL DATA:  Knee pain EXAM: RIGHT KNEE - COMPLETE 4+ VIEW COMPARISON:  None Available. FINDINGS: Normal alignment no fracture. Small joint effusion. Loose body in the joint posteriorly. Mild joint space narrowing and spurring medially. Moderate degenerative change in the patellofemoral joint with joint space narrowing and spurring. Lateral joint space intact. IMPRESSION: Degenerative change medially and in the patellofemoral joint.  Calcified loose body in the knee joint posteriorly. Electronically Signed   By: Franchot Gallo M.D.   On: 01/28/2022 12:04   DG Chest 2 View  Result Date: 01/28/2022 CLINICAL DATA:  Chest pain. EXAM: CHEST - 2 VIEW COMPARISON:  Chest x-ray 05/08/2021. FINDINGS: The heart size and mediastinal contours are within normal limits. Both lungs are clear. No visible pleural effusions or pneumothorax. No acute osseous abnormality. IMPRESSION: No evidence of acute cardiopulmonary disease. Electronically Signed   By: Margaretha Sheffield M.D.   On: 01/28/2022 10:53    Micro Results    No results found for this or any previous visit (from the past 240 hour(s)).  Today   Subjective    Ardella Chhim today has no headache,no chest abdominal pain,no new weakness tingling or numbness, feels much better wants to go home today.    Objective   Blood pressure 129/62, pulse 82, temperature 98.8 F (37.1 C), temperature source Oral, resp. rate 18, height '5\' 4"'$  (1.626 m), weight 101.6 kg, last menstrual period 02/04/2018, SpO2 100 %.   Intake/Output Summary (Last 24 hours) at 01/31/2022 0903 Last data filed at 01/30/2022 1332 Gross per 24 hour  Intake 86.06 ml  Output --  Net 86.06 ml    Exam  Awake Alert, No new F.N deficits,    [place].AT,PERRAL Supple Neck,   Symmetrical Chest wall movement, Good air movement bilaterally, CTAB RRR,No Gallops,   +ve B.Sounds, Abd Soft, Non tender,  No Cyanosis, Clubbing or edema    Data Review   Recent Labs  Lab 01/28/22 1045  WBC 11.2*  HGB 13.4  HCT 40.8  PLT 411*  MCV 87.7  MCH 28.8  MCHC 32.8  RDW 13.2    Recent Labs  Lab 01/28/22 1045 01/28/22 1127 01/28/22 1208 01/28/22 1618 01/29/22 0258 01/30/22 0626  NA 139  --   --   --  140 139  K 3.3*  --   --   --  4.2 4.6  CL 100  --   --   --  101 100  CO2 31  --   --   --  26 29  GLUCOSE 121*  --   --   --  117* 116*  BUN 6  --   --   --  8 12  CREATININE 0.93  --   --   --  1.22* 0.96   CALCIUM 9.6  --   --   --  9.5 9.6  AST 18  --   --   --   --   --   ALT 15  --   --   --   --   --   ALKPHOS 113  --   --   --   --   --  BILITOT 0.7  --   --   --   --   --   ALBUMIN 4.0  --   --   --   --   --   DDIMER  --  0.79*  --   --   --   --   TSH 2.323  --   --   --   --   --   HGBA1C  --   --   --  6.3*  --   --   BNP  --   --  6.5  --   --   --     Total Time in preparing paper work, data evaluation and todays exam - 35 minutes  Lala Lund M.D on 01/31/2022 at 9:03 AM  [facility]

## 2022-02-11 NOTE — Progress Notes (Signed)
Error. Patient was a no show to appointment.

## 2022-02-13 ENCOUNTER — Encounter: Payer: Medicaid Other | Admitting: Physician Assistant

## 2022-02-13 DIAGNOSIS — E039 Hypothyroidism, unspecified: Secondary | ICD-10-CM

## 2022-02-13 DIAGNOSIS — E785 Hyperlipidemia, unspecified: Secondary | ICD-10-CM

## 2022-02-13 DIAGNOSIS — R079 Chest pain, unspecified: Secondary | ICD-10-CM

## 2022-02-13 DIAGNOSIS — G4733 Obstructive sleep apnea (adult) (pediatric): Secondary | ICD-10-CM

## 2022-02-13 DIAGNOSIS — I10 Essential (primary) hypertension: Secondary | ICD-10-CM

## 2022-02-13 DIAGNOSIS — I5033 Acute on chronic diastolic (congestive) heart failure: Secondary | ICD-10-CM

## 2022-02-13 DIAGNOSIS — N179 Acute kidney failure, unspecified: Secondary | ICD-10-CM

## 2022-03-11 ENCOUNTER — Other Ambulatory Visit: Payer: Self-pay

## 2022-03-11 ENCOUNTER — Emergency Department (HOSPITAL_COMMUNITY)
Admission: EM | Admit: 2022-03-11 | Discharge: 2022-03-12 | Discharge status: Against Medical Advice | Payer: Medicaid Other | Attending: Emergency Medicine | Admitting: Emergency Medicine

## 2022-03-11 DIAGNOSIS — R1032 Left lower quadrant pain: Secondary | ICD-10-CM | POA: Insufficient documentation

## 2022-03-11 DIAGNOSIS — Z5321 Procedure and treatment not carried out due to patient leaving prior to being seen by health care provider: Secondary | ICD-10-CM | POA: Diagnosis not present

## 2022-03-11 DIAGNOSIS — R42 Dizziness and giddiness: Secondary | ICD-10-CM | POA: Diagnosis not present

## 2022-03-11 DIAGNOSIS — R55 Syncope and collapse: Secondary | ICD-10-CM | POA: Insufficient documentation

## 2022-03-11 LAB — CBC
HCT: 43 % (ref 36.0–46.0)
Hemoglobin: 15.2 g/dL — ABNORMAL HIGH (ref 12.0–15.0)
MCH: 29.8 pg (ref 26.0–34.0)
MCHC: 35.3 g/dL (ref 30.0–36.0)
MCV: 84.3 fL (ref 80.0–100.0)
Platelets: 483 10*3/uL — ABNORMAL HIGH (ref 150–400)
RBC: 5.1 MIL/uL (ref 3.87–5.11)
RDW: 12.9 % (ref 11.5–15.5)
WBC: 17.6 10*3/uL — ABNORMAL HIGH (ref 4.0–10.5)
nRBC: 0 % (ref 0.0–0.2)

## 2022-03-11 LAB — COMPREHENSIVE METABOLIC PANEL
ALT: 12 U/L (ref 0–44)
AST: 17 U/L (ref 15–41)
Albumin: 4.6 g/dL (ref 3.5–5.0)
Alkaline Phosphatase: 123 U/L (ref 38–126)
Anion gap: 17 — ABNORMAL HIGH (ref 5–15)
BUN: 14 mg/dL (ref 6–20)
CO2: 23 mmol/L (ref 22–32)
Calcium: 10.3 mg/dL (ref 8.9–10.3)
Chloride: 101 mmol/L (ref 98–111)
Creatinine, Ser: 1.25 mg/dL — ABNORMAL HIGH (ref 0.44–1.00)
GFR, Estimated: 51 mL/min — ABNORMAL LOW (ref >60)
Glucose, Bld: 148 mg/dL — ABNORMAL HIGH (ref 70–99)
Potassium: 3 mmol/L — ABNORMAL LOW (ref 3.5–5.1)
Sodium: 141 mmol/L (ref 135–145)
Total Bilirubin: 1.1 mg/dL (ref 0.3–1.2)
Total Protein: 9.1 g/dL — ABNORMAL HIGH (ref 6.5–8.1)

## 2022-03-11 NOTE — ED Triage Notes (Signed)
Pt c/o abd pain, nausea, bloody loose stools, intermittent episodes x1wk. Hx colitis, GI bleed, anemia.  Zofran

## 2022-03-11 NOTE — ED Provider Triage Note (Signed)
Emergency Medicine Provider Triage Evaluation Note  Alexis [name] , a 56 y.o. female  was evaluated in triage.  Pt complains of abdominal pain going on for about a week's time left lower quadrant, had episodes of tasting blood in her mouth and spitting it out, without hematemesis or coffee-ground emesis, states that she has been had multiple bouts of bloody stools, no history of PUD, not on anticoag's, states that she history of hemorrhoids as well as colitis, she is never had to have a blood transfusion, states that she has difficulty with p.o. intake.  She has felt lightheaded dizziness near syncope especially going from sitting to standing..  Review of Systems  Positive: Abdominal pain, hematochezia Negative: Chest pain, shortness of breath  Physical Exam  BP (!) 154/109 (BP Location: Right Arm)   Pulse (!) 137   Temp (!) 97.1 F (36.2 C) (Oral)   Resp 16   LMP 02/04/2018   SpO2 100%  Gen:   Awake, no distress   Resp:  Normal effort  MSK:   Moves extremities without difficulty  Other:    Medical Decision Making  Medically screening exam initiated at 10:17 PM.  Appropriate orders placed.  [name] was informed that the remainder of the evaluation will be completed by another provider, this initial triage assessment does not replace that evaluation, and the importance of remaining in the ED until their evaluation is complete.  Lab work imaging been ordered will need further work-up.   Marcello Fennel, PA-C 03/11/22 2220

## 2022-03-12 ENCOUNTER — Emergency Department (HOSPITAL_COMMUNITY): Payer: Medicaid Other

## 2022-03-12 LAB — I-STAT BETA HCG BLOOD, ED (MC, WL, AP ONLY): I-stat hCG, quantitative: <5 m[IU]/mL (ref <5)

## 2022-03-12 MED ORDER — IOHEXOL 350 MG/ML SOLN
75.0000 mL | Freq: Once | INTRAVENOUS | Status: AC | PRN
  Administered 2022-03-12: 75 mL via INTRAVENOUS

## 2022-03-12 NOTE — ED Notes (Signed)
Patient decided to leave she stated she couldn't stay any longer. Iv was removed from Left AC. Tape was applied with a 2x2.

## 2022-03-12 NOTE — ED Notes (Signed)
Nurse was informed of Pt. needing some meds. For nausea.

## 2022-03-13 ENCOUNTER — Other Ambulatory Visit: Payer: Self-pay

## 2022-03-13 ENCOUNTER — Emergency Department (HOSPITAL_COMMUNITY)
Admission: EM | Admit: 2022-03-13 | Discharge: 2022-03-14 | Disposition: A | Discharge status: Home / Self Care | Payer: Medicaid Other | Attending: Emergency Medicine | Admitting: Emergency Medicine

## 2022-03-13 ENCOUNTER — Encounter (HOSPITAL_COMMUNITY): Payer: Self-pay | Admitting: Emergency Medicine

## 2022-03-13 DIAGNOSIS — Z7982 Long term (current) use of aspirin: Secondary | ICD-10-CM | POA: Insufficient documentation

## 2022-03-13 DIAGNOSIS — K921 Melena: Secondary | ICD-10-CM | POA: Insufficient documentation

## 2022-03-13 DIAGNOSIS — R1033 Periumbilical pain: Secondary | ICD-10-CM

## 2022-03-13 DIAGNOSIS — E876 Hypokalemia: Secondary | ICD-10-CM | POA: Diagnosis not present

## 2022-03-13 DIAGNOSIS — R1030 Lower abdominal pain, unspecified: Secondary | ICD-10-CM | POA: Diagnosis present

## 2022-03-13 DIAGNOSIS — Z91040 Latex allergy status: Secondary | ICD-10-CM | POA: Insufficient documentation

## 2022-03-13 DIAGNOSIS — N3001 Acute cystitis with hematuria: Secondary | ICD-10-CM | POA: Insufficient documentation

## 2022-03-13 DIAGNOSIS — I10 Essential (primary) hypertension: Secondary | ICD-10-CM | POA: Diagnosis not present

## 2022-03-13 DIAGNOSIS — Z79899 Other long term (current) drug therapy: Secondary | ICD-10-CM | POA: Insufficient documentation

## 2022-03-13 DIAGNOSIS — E119 Type 2 diabetes mellitus without complications: Secondary | ICD-10-CM | POA: Insufficient documentation

## 2022-03-13 LAB — CBC WITH DIFFERENTIAL/PLATELET
Abs Immature Granulocytes: 0.06 10*3/uL (ref 0.00–0.07)
Basophils Absolute: 0.1 10*3/uL (ref 0.0–0.1)
Basophils Relative: 1 %
Eosinophils Absolute: 0.1 10*3/uL (ref 0.0–0.5)
Eosinophils Relative: 1 %
HCT: 41 % (ref 36.0–46.0)
Hemoglobin: 14 g/dL (ref 12.0–15.0)
Immature Granulocytes: 1 %
Lymphocytes Relative: 12 %
Lymphs Abs: 1.5 10*3/uL (ref 0.7–4.0)
MCH: 29.6 pg (ref 26.0–34.0)
MCHC: 34.1 g/dL (ref 30.0–36.0)
MCV: 86.7 fL (ref 80.0–100.0)
Monocytes Absolute: 1.5 10*3/uL — ABNORMAL HIGH (ref 0.1–1.0)
Monocytes Relative: 12 %
Neutro Abs: 9.5 10*3/uL — ABNORMAL HIGH (ref 1.7–7.7)
Neutrophils Relative %: 73 %
Platelets: 443 10*3/uL — ABNORMAL HIGH (ref 150–400)
RBC: 4.73 MIL/uL (ref 3.87–5.11)
RDW: 13.1 % (ref 11.5–15.5)
WBC: 12.7 10*3/uL — ABNORMAL HIGH (ref 4.0–10.5)
nRBC: 0 % (ref 0.0–0.2)

## 2022-03-13 LAB — URINALYSIS, ROUTINE W REFLEX MICROSCOPIC
Bilirubin Urine: NEGATIVE
Glucose, UA: NEGATIVE mg/dL
Ketones, ur: NEGATIVE mg/dL
Nitrite: NEGATIVE
Protein, ur: 100 mg/dL — AB
Specific Gravity, Urine: 1.018 (ref 1.005–1.030)
WBC, UA: >50 WBC/hpf — ABNORMAL HIGH (ref 0–5)
pH: 6 (ref 5.0–8.0)

## 2022-03-13 LAB — COMPREHENSIVE METABOLIC PANEL
ALT: 11 U/L (ref 0–44)
AST: 18 U/L (ref 15–41)
Albumin: 4.2 g/dL (ref 3.5–5.0)
Alkaline Phosphatase: 101 U/L (ref 38–126)
Anion gap: 17 — ABNORMAL HIGH (ref 5–15)
BUN: 8 mg/dL (ref 6–20)
CO2: 25 mmol/L (ref 22–32)
Calcium: 9.9 mg/dL (ref 8.9–10.3)
Chloride: 100 mmol/L (ref 98–111)
Creatinine, Ser: 1.16 mg/dL — ABNORMAL HIGH (ref 0.44–1.00)
GFR, Estimated: 56 mL/min — ABNORMAL LOW (ref >60)
Glucose, Bld: 166 mg/dL — ABNORMAL HIGH (ref 70–99)
Potassium: 3 mmol/L — ABNORMAL LOW (ref 3.5–5.1)
Sodium: 142 mmol/L (ref 135–145)
Total Bilirubin: 1 mg/dL (ref 0.3–1.2)
Total Protein: 8 g/dL (ref 6.5–8.1)

## 2022-03-13 LAB — POC OCCULT BLOOD, ED: Fecal Occult Bld: NEGATIVE

## 2022-03-13 LAB — TYPE AND SCREEN
ABO/RH(D): A POS
ABO/RH(D): A POS
Antibody Screen: NEGATIVE
Antibody Screen: NEGATIVE

## 2022-03-13 LAB — LIPASE, BLOOD: Lipase: 35 U/L (ref 11–51)

## 2022-03-13 MED ORDER — POTASSIUM CHLORIDE CRYS ER 20 MEQ PO TBCR
40.0000 meq | EXTENDED_RELEASE_TABLET | Freq: Once | ORAL | Status: AC
  Administered 2022-03-13: 40 meq via ORAL
  Filled 2022-03-13: qty 2

## 2022-03-13 MED ORDER — DICYCLOMINE HCL 10 MG PO CAPS
10.0000 mg | ORAL_CAPSULE | Freq: Once | ORAL | Status: AC
  Administered 2022-03-13: 10 mg via ORAL
  Filled 2022-03-13: qty 1

## 2022-03-13 MED ORDER — SODIUM CHLORIDE 0.9 % IV SOLN
12.5000 mg | Freq: Once | INTRAVENOUS | Status: AC
  Administered 2022-03-13: 12.5 mg via INTRAVENOUS
  Filled 2022-03-13: qty 12.5

## 2022-03-13 MED ORDER — ALUM & MAG HYDROXIDE-SIMETH 200-200-20 MG/5ML PO SUSP
30.0000 mL | Freq: Once | ORAL | Status: AC
  Administered 2022-03-13: 30 mL via ORAL
  Filled 2022-03-13: qty 30

## 2022-03-13 MED ORDER — ONDANSETRON 4 MG PO TBDP
4.0000 mg | ORAL_TABLET | Freq: Once | ORAL | Status: DC
  Filled 2022-03-13: qty 1

## 2022-03-13 MED ORDER — SODIUM CHLORIDE 0.9 % IV BOLUS
1000.0000 mL | Freq: Once | INTRAVENOUS | Status: AC
  Administered 2022-03-13: 1000 mL via INTRAVENOUS

## 2022-03-13 MED ORDER — SODIUM CHLORIDE 0.9 % IV SOLN
1.0000 g | Freq: Once | INTRAVENOUS | Status: AC
  Administered 2022-03-13: 1 g via INTRAVENOUS
  Filled 2022-03-13: qty 10

## 2022-03-13 MED ORDER — MORPHINE SULFATE (PF) 4 MG/ML IV SOLN
4.0000 mg | Freq: Once | INTRAVENOUS | Status: AC
  Administered 2022-03-13: 4 mg via INTRAVENOUS
  Filled 2022-03-13: qty 1

## 2022-03-13 MED ORDER — CEPHALEXIN 500 MG PO CAPS: 500.0000 mg | ORAL_CAPSULE | Freq: Four times a day (QID) | ORAL | 0 refills | Status: DC

## 2022-03-13 NOTE — ED Provider Notes (Signed)
[facility] EMERGENCY DEPARTMENT Provider Note   CSN: [id number] Arrival date & time: 03/13/22  1113     History  Chief Complaint  Patient presents with   Abdominal Pain    Alexis [name] is a 56 y.o. female.  Patient with history of diabetes and hypertension presents today with complaints of abdominal pain. States that same has been ongoing since 10/18. States that her pain is in her suprapubic region and does not move or radiate. Denies any dysuria or hematuria. Does endorse some bloody stools for the past few days as well. States that the toilet has been red and there is blood when she wipes, but no clots visualized and no frank blood. She is not anticoagulated. States she has had history of GI bleeding in 2018 which was thought to be due to colitis, states that she has not had any hematochezia or melena since then and states that episode was much more significant than this time. Her last colonoscopy was in 2017 which did show internal hemorrhoids as well as a polyp which was removed, plan for repeat in 5 years, however patient has not followed up for repeat yet. Patient states that her pain is accompanied with nausea and decreased appetite. States that initially when her symptoms began she was having NBNB emesis, however none since. Of note, patient was seen yesterday for same and had unremarkable CT imaging and laboratory evaluation, but left without being seen due to extended wait times.   The history is provided by the patient. No language interpreter was used.  Abdominal Pain Associated symptoms: nausea        Home Medications Prior to Admission medications   Medication Sig Start Date End Date Taking? Authorizing Provider  acetaminophen (TYLENOL) 500 MG tablet Take 500 mg by mouth every 6 (six) hours as needed for headache.   Yes [provider]  albuterol (PROVENTIL HFA;VENTOLIN HFA) 108 (90 BASE) MCG/ACT inhaler Inhale 2 puffs into the lungs every  4 (four) hours as needed for wheezing or shortness of breath.    Yes [provider]  aspirin 81 MG chewable tablet Chew 1 tablet (81 mg total) by mouth daily. 01/31/22  Yes Thurnell Lose, MD  atorvastatin (LIPITOR) 40 MG tablet Take 1 tablet (40 mg total) by mouth daily. 01/31/22  Yes Thurnell Lose, MD  BUTRANS 20 MCG/HR PTWK Place 1 patch onto the skin once a week. 03/11/22  Yes [provider]  carvedilol (COREG) 6.25 MG tablet Take 1 tablet (6.25 mg total) by mouth 2 (two) times daily with a meal. 01/31/22  Yes Thurnell Lose, MD  cetirizine (ZYRTEC) 10 MG tablet Take 10 mg by mouth daily. 03/10/22  Yes [provider]  Diclofenac Sodium 3 % GEL Apply 1 Application topically daily. Apply to the right knee 3 times a day. 01/31/22  Yes Thurnell Lose, MD  DULoxetine (CYMBALTA) 30 MG capsule Take 60 mg by mouth at bedtime. 01/24/20  Yes [provider]  hydroxychloroquine (PLAQUENIL) 200 MG tablet Take 200 mg by mouth 2 (two) times daily. 01/23/22  Yes [provider]  Lidocaine 0.5 % GEL Apply 1 application topically 3 (three) times daily as needed (pain).    Yes [provider]  losartan (COZAAR) 25 MG tablet Take 1 tablet (25 mg total) by mouth daily. 01/31/22  Yes Thurnell Lose, MD  LYRICA 75 MG capsule Take 75 mg by mouth at bedtime. 07/24/16  Yes [provider]  Melatonin 12 MG TABS Take 12 mg by mouth at bedtime.   Yes [provider]  omeprazole (PRILOSEC) 40 MG capsule Take 40 mg by mouth daily.   Yes [provider]  ondansetron (ZOFRAN-ODT) 8 MG disintegrating tablet Take 8 mg by mouth every 8 (eight) hours as needed for nausea or vomiting.   Yes [provider]  oxybutynin (DITROPAN XL) 15 MG 24 hr tablet Take 1 tablet (15 mg total) by mouth daily. 01/31/22  Yes Thurnell Lose, MD  Oxycodone HCl 20 MG TABS Take 20 mg by mouth every 4 (four) hours as needed for pain.   Yes [provider]  promethazine (PHENERGAN) 25 MG tablet Take 25 mg by mouth every 6 (six) hours as needed for nausea or vomiting.   Yes [provider]  SYMBICORT 160-4.5 MCG/ACT inhaler Inhale 1 puff into the lungs in the morning and at bedtime. 11/06/20  Yes [provider]  thyroid (ARMOUR) 120 MG tablet Take 120 mg by mouth daily before breakfast.   Yes [provider]  tizanidine (ZANAFLEX) 6 MG capsule Take 6 mg by mouth 3 (three) times daily. 10/10/20  Yes [provider]  Vitamin D, Ergocalciferol, (DRISDOL) 1.25 MG (50000 UNIT) CAPS capsule Take 50,000 Units by mouth every Tuesday. 01/24/20  Yes [provider]  lisinopril (ZESTRIL) 20 MG tablet Take 1 tablet (20 mg total) by mouth daily. 02/10/20 02/10/20  Milton Ferguson, MD      Allergies    Food, Other, Adhesive [tape], Bactrim, Clarithromycin, Ibuprofen, Meclizine, Metformin hcl, Nexium [esomeprazole], Nsaids, Peg 3350-electrolytes, Sulfa antibiotics, Tisagenlecleucel, Tolmetin, Topiramate, Trimethoprim, and Latex    Review of Systems   Review of Systems  Gastrointestinal:  Positive for abdominal pain and nausea.  All other systems reviewed and are negative.   Physical Exam Updated Vital Signs BP (!) 113/96   Pulse (!) 104   Temp 98.3 F (36.8 C)   Resp 16   LMP 02/04/2018   SpO2 98%  Physical Exam Vitals and nursing note reviewed. Exam conducted with a chaperone present.  Constitutional:      General: She is not in acute distress.    Appearance: Normal appearance. She is well-developed and normal weight. She is not ill-appearing, toxic-appearing or diaphoretic.  HENT:     Head: Normocephalic and atraumatic.  Cardiovascular:     Rate and Rhythm: Normal rate and regular rhythm.     Heart sounds: Normal heart sounds.  Pulmonary:     Effort: Pulmonary effort is normal. No respiratory distress.     Breath sounds: Normal breath sounds.  Abdominal:     General: Abdomen is flat.      Palpations: Abdomen is soft.     Tenderness: There is abdominal tenderness in the suprapubic area. There is no right CVA tenderness or left CVA tenderness.  Genitourinary:    Comments: Rectal exam without any hemorrhoids or fissures visualized. No obvious signs of blood. No stool in the rectal vault. No other abnormalities present Musculoskeletal:        General: Normal range of motion.     Cervical back: Normal range of motion.  Skin:    General: Skin is warm and dry.  Neurological:     General: No focal deficit present.     Mental Status: She is alert.  Psychiatric:        Mood and Affect: Mood normal.        Behavior: Behavior normal.  ED Results / Procedures / Treatments   Labs (all labs ordered are listed, but only abnormal results are displayed) Labs Reviewed  CBC WITH DIFFERENTIAL/PLATELET - Abnormal; Notable for the following components:      Result Value   WBC 12.7 (*)    Platelets 443 (*)    Neutro Abs 9.5 (*)    Monocytes Absolute 1.5 (*)    All other components within normal limits  COMPREHENSIVE METABOLIC PANEL - Abnormal; Notable for the following components:   Potassium 3.0 (*)    Glucose, Bld 166 (*)    Creatinine, Ser 1.16 (*)    GFR, Estimated 56 (*)    Anion gap 17 (*)    All other components within normal limits  URINALYSIS, ROUTINE W REFLEX MICROSCOPIC - Abnormal; Notable for the following components:   Color, Urine AMBER (*)    APPearance CLOUDY (*)    Hgb urine dipstick SMALL (*)    Protein, ur 100 (*)    Leukocytes,Ua LARGE (*)    WBC, UA >50 (*)    Bacteria, UA FEW (*)    Non Squamous Epithelial 0-5 (*)    All other components within normal limits  URINE CULTURE  LIPASE, BLOOD  POC OCCULT BLOOD, ED  TYPE AND SCREEN    EKG EKG Interpretation  Date/Time:  Friday March 13 2022 11:38:06 EDT Ventricular Rate:  110 PR Interval:  144 QRS Duration: 88 QT Interval:  330 QTC Calculation: 446 R Axis:   41 Text Interpretation: Sinus  tachycardia  ST wave abnormalities unchanged from last tracing Abnormal ECG When compared with ECG of 11-Mar-2022 22:00, PREVIOUS ECG IS PRESENT No sig change from last tracing Confirmed by Octaviano Glow [phone]) on 03/13/2022 8:48:54 PM  Radiology CT ABDOMEN PELVIS W CONTRAST  Result Date: 03/12/2022 CLINICAL DATA:  Left lower quadrant abdominal pain, nausea, bloody loose stools. Intermittent episodes for 1 week. History of colitis. EXAM: CT ABDOMEN AND PELVIS WITH CONTRAST TECHNIQUE: Multidetector CT imaging of the abdomen and pelvis was performed using the standard protocol following bolus administration of intravenous contrast. RADIATION DOSE REDUCTION: This exam was performed according to the departmental dose-optimization program which includes automated exposure control, adjustment of the mA and/or kV according to patient size and/or use of iterative reconstruction technique. CONTRAST:  97m OMNIPAQUE IOHEXOL 350 MG/ML SOLN COMPARISON:  06/12/2020 FINDINGS: Lower chest: Lung bases are clear. Hepatobiliary: No focal liver abnormality is seen. No gallstones, gallbladder wall thickening, or biliary dilatation. Pancreas: Unremarkable. No pancreatic ductal dilatation or surrounding inflammatory changes. Spleen: Normal in size without focal abnormality. Adrenals/Urinary Tract: Adrenal glands are unremarkable. Kidneys are normal, without renal calculi, focal solid lesion, or hydronephrosis. Bladder is unremarkable. Stomach/Bowel: Stomach, small bowel, and colon are not abnormally distended. Under distention limits evaluation but no discrete wall thickening is appreciated. No inflammatory stranding. Appendix is normal. Vascular/Lymphatic: No significant vascular findings are present. No enlarged abdominal or pelvic lymph nodes. Reproductive: Uterus and bilateral adnexa are unremarkable. Other: No abdominal wall hernia or abnormality. No abdominopelvic ascites. Musculoskeletal: Degenerative changes. No acute  bony abnormalities. Spondylolysis with mild spondylolisthesis at L4-5. Left trans sacral stimulator probe with generator pack in the left gluteal region. IMPRESSION: No acute process demonstrated in the abdomen or pelvis. No evidence of bowel obstruction or inflammation. Electronically Signed   By: WLucienne CapersM.D.   On: 03/12/2022 02:38    Procedures Procedures    Medications Ordered in ED Medications  ondansetron (ZOFRAN-ODT) disintegrating tablet 4 mg (4 mg Oral  Patient Refused/Not Given 03/13/22 1207)  cefTRIAXone (ROCEPHIN) 1 g in sodium chloride 0.9 % 100 mL IVPB (has no administration in time range)  promethazine (PHENERGAN) 12.5 mg in sodium chloride 0.9 % 50 mL IVPB (has no administration in time range)  morphine (PF) 4 MG/ML injection 4 mg (has no administration in time range)  sodium chloride 0.9 % bolus 1,000 mL (has no administration in time range)  potassium chloride SA (KLOR-CON M) CR tablet 40 mEq (has no administration in time range)    ED Course/ Medical Decision Making/ A&P                           Medical Decision Making Risk Prescription drug management.   This patient is a 56 y.o. female who presents to the ED for concern of abdominal pain and hematochezia, this involves an extensive number of treatment options, and is a complaint that carries with it a high risk of complications and morbidity. The emergent differential diagnosis prior to evaluation includes, but is not limited to,  The differential diagnosis for generalized abdominal pain includes, but is not limited to AAA, gastroenteritis, appendicitis, Bowel obstruction, Bowel perforation. Gastroparesis, DKA, Hernia, Inflammatory bowel disease, mesenteric ischemia, pancreatitis, peritonitis SBP, volvulus.  This is not an exhaustive differential.   Past Medical History / Co-morbidities / Social History: Hx diabetes and hypertension  Additional history: Chart reviewed. Pertinent results include: last  colonoscopy 2017 with hemorrhoids and polyps, was supposed to have subsequent colonoscopy last year.   Physical Exam: Physical exam performed. The pertinent findings include: suprapubic abdominal tenderness. Rectal exam performed with chaperone present. No signs of bleeding, hemorrhoids, or fissure.  Lab Tests: I ordered, and personally interpreted labs.  The pertinent results include:  UA infectious with large leukocytes and >50 WBCs and bacteria. WBC 12.7, hemoglobin did drop from  15.2 to 14.0 but is stable. K 3.0, likely from poor intake and emesis. Creatinine 1.16 mildly elevated from previous, likely due to dehydration. Hemoccult negative   Imaging Studies:  Ct abdomen pelvis ordered and performed yesterday when patient presented for same symptoms. I independently visualized and interpreted imaging which showed no acute findings. I agree with the radiologist interpretation.   Cardiac Monitoring:  The patient was maintained on a cardiac monitor.  My attending physician Dr. Langston Masker viewed and interpreted the cardiac monitored which showed an underlying rhythm of: sinus tachycardia, no STEMI. I agree with this interpretation.   Medications: I ordered medication including morphine, GI cocktail, and bentyl  for pain, fluids for dehydration, and zofran and phenergan for nausea. Rocephin for UTI. Oral potassium for hypokalemia. Reevaluation of the patient after these medicines showed that the patient improved. I have reviewed the patients home medicines and have made adjustments as needed.  Disposition:  Patient presents today with complaints of abdominal pain and hematochezia. She is afebrile, non-toxic appearing, and in no acute distress with reassuring vital signs. Patient is nontoxic, nonseptic appearing, in no apparent distress.  Patient's pain and other symptoms adequately managed in emergency department.  Fluid bolus given.  Labs, imaging and vitals reviewed.  Patient does not meet the SIRS  or Sepsis criteria.  On repeat exam patient does not have a surgical abdomin and there are no peritoneal signs.  No indication of appendicitis, bowel obstruction, bowel perforation, cholecystitis, diverticulitis, PID or ectopic pregnancy.  Patient does have a UTI which is likely cause of her symptoms. Given Rocephin in the ER, will send  home with Keflex. Will also give phenergan suppository for any residual nausea as she states Zofran does not work for her. She does endorse GI bleeding, none seen on exam. She did have an internal hemorrhoid on her previous colonoscopy which could be the source. Her hemoglobin is stable. Therefore do not think admission is indicated for these symptoms.  I have also informed her that her potassium was low today and she will need it rechecked at her primary care office at her earliest convenience.  After medication and fluids, patient is able to eat and drink normally without any residual nausea or vomiting. She is stable for discharge. I have spent extensive amount of time reviewing patients labs and imaging with her and discussing plan and have ensured that all questions have been answered. Will give referral to GI for follow-up colonoscopy and continued evaluation and management of her symptoms. Patient also given strict instructions for follow-up with their primary care physician.  I have also discussed reasons to return immediately to the ER.  Patient expresses understanding and agrees with plan.  Patient discharged in stable condition.   Findings and plan of care discussed with supervising physician Dr. Langston Masker who is in agreement.    Final Clinical Impression(s) / ED Diagnoses Final diagnoses:  Periumbilical abdominal pain  Acute cystitis with hematuria  Hematochezia  Hypokalemia    Rx / DC Orders ED Discharge Orders          Ordered    promethazine (PHENERGAN) 25 MG suppository  Every 6 hours PRN        03/14/22 0001    cephALEXin (KEFLEX) 500 MG capsule  4  times daily        03/13/22 2359          An After Visit Summary was printed and given to the patient.     Nestor Lewandowsky 03/14/22 0004    Wyvonnia Dusky, MD 03/14/22 [phone]

## 2022-03-13 NOTE — ED Triage Notes (Signed)
Patient here with complaint of rectal bleeding, abdominal pain, and emesis that started approximately one week ago. Patient states she was here for same two days ago but left without being seen due to wait time. Patient is alert, oriented, and in no apparent distress at this time.

## 2022-03-13 NOTE — ED Provider Triage Note (Signed)
Emergency Medicine Provider Triage Evaluation Note  Alexis [name] , a 56 y.o. female  was evaluated in triage.  Pt complains of abdominal pain and rectal bleeding x1 week.  Patient had a CT scan yesterday which was negative for any acute abnormalities.  Patient admits to bright red blood in toilet bowl after bowel movements.  Patient admits to emesis yesterday. No blood thinners.  Review of Systems  Positive: Abdominal pain Negative: CP  Physical Exam  BP (!) 140/93 (BP Location: Right Arm)   Pulse (!) 118   Temp 98.6 F (37 C) (Oral)   Resp 17   LMP 02/04/2018   SpO2 98%  Gen:   Awake, no distress   Resp:  Normal effort  MSK:   Moves extremities without difficulty  Other:  Diffuse tenderness  Medical Decision Making  Medically screening exam initiated at 11:50 AM.  Appropriate orders placed.  Alexis [name] was informed that the remainder of the evaluation will be completed by another provider, this initial triage assessment does not replace that evaluation, and the importance of remaining in the ED until their evaluation is complete.  labs   Alexis [name], [place] 03/13/22 1152

## 2022-03-14 MED ORDER — PROMETHAZINE HCL 25 MG RE SUPP: 25.0000 mg | Freq: Four times a day (QID) | RECTAL | 0 refills | Status: AC | PRN

## 2022-03-14 MED ORDER — FLUCONAZOLE 150 MG PO TABS: 150.0000 mg | ORAL_TABLET | Freq: Every day | ORAL | 0 refills | Status: DC

## 2022-03-14 NOTE — ED Notes (Signed)
Pt requesting also to have Diflucan sent to her pharmacy due to easily developing yeast infection when using abx. Provider made aware and will send a prescription over.

## 2022-03-14 NOTE — Discharge Instructions (Addendum)
As we discussed, your work-up in the ER today did reveal that you have a urinary tract infection.  I have given you a prescription for an antibiotic for you to take as prescribed in its entirety for management of your symptoms.  Have also given you a prescription for a Phenergan suppository for you to use for any residual nausea.  Additionally, your potassium was low today and will need to be rechecked by her primary care doctor at your earliest convenience.  Please call to schedule an appointment for continued evaluation and management.  Additionally, I have given you a referral to a GI specialist with a number to call to schedule an appointment to further evaluate the blood in your stool.  Return if development of any new or worsening symptoms.

## 2022-03-15 LAB — URINE CULTURE: Culture: <10000 — AB

## 2022-06-09 ENCOUNTER — Ambulatory Visit: Payer: Medicaid Other | Admitting: Allergy and Immunology

## 2022-07-06 ENCOUNTER — Ambulatory Visit: Payer: Medicaid Other | Admitting: Allergy

## 2022-07-21 ENCOUNTER — Ambulatory Visit: Payer: Medicaid Other | Admitting: Allergy and Immunology

## 2022-08-25 ENCOUNTER — Ambulatory Visit: Payer: Medicaid Other | Admitting: Allergy and Immunology

## 2022-10-08 ENCOUNTER — Telehealth: Payer: Self-pay

## 2022-10-08 ENCOUNTER — Ambulatory Visit (INDEPENDENT_AMBULATORY_CARE_PROVIDER_SITE_OTHER): Payer: Medicaid Other | Admitting: Allergy and Immunology

## 2022-10-08 ENCOUNTER — Encounter: Payer: Self-pay | Admitting: Allergy and Immunology

## 2022-10-08 VITALS — BP 146/92 | HR 101 | Resp 16 | Ht 63.0 in | Wt 237.8 lb

## 2022-10-08 DIAGNOSIS — J4530 Mild persistent asthma, uncomplicated: Secondary | ICD-10-CM | POA: Diagnosis not present

## 2022-10-08 DIAGNOSIS — J3089 Other allergic rhinitis: Secondary | ICD-10-CM | POA: Diagnosis not present

## 2022-10-08 DIAGNOSIS — I10 Essential (primary) hypertension: Secondary | ICD-10-CM

## 2022-10-08 DIAGNOSIS — J301 Allergic rhinitis due to pollen: Secondary | ICD-10-CM

## 2022-10-08 DIAGNOSIS — K219 Gastro-esophageal reflux disease without esophagitis: Secondary | ICD-10-CM

## 2022-10-08 DIAGNOSIS — T781XXA Other adverse food reactions, not elsewhere classified, initial encounter: Secondary | ICD-10-CM

## 2022-10-08 MED ORDER — VENTOLIN HFA 108 (90 BASE) MCG/ACT IN AERS: INHALATION_SPRAY | RESPIRATORY_TRACT | 1 refills | Status: DC

## 2022-10-08 MED ORDER — SPACER/AERO-HOLDING CHAMBERS DEVI: 1 refills | Status: AC

## 2022-10-08 MED ORDER — EPINEPHRINE 0.3 MG/0.3ML IJ SOAJ: INTRAMUSCULAR | 3 refills | Status: DC

## 2022-10-08 MED ORDER — OMEPRAZOLE 40 MG PO CPDR: 40.0000 mg | DELAYED_RELEASE_CAPSULE | Freq: Two times a day (BID) | ORAL | 1 refills | Status: DC

## 2022-10-08 MED ORDER — FLUTICASONE PROPIONATE HFA 110 MCG/ACT IN AERO: INHALATION_SPRAY | RESPIRATORY_TRACT | 5 refills | Status: DC

## 2022-10-08 MED ORDER — MOMETASONE FUROATE 50 MCG/ACT NA SUSP: NASAL | 5 refills | Status: DC

## 2022-10-08 MED ORDER — OLOPATADINE HCL 0.2 % OP SOLN: OPHTHALMIC | 5 refills | Status: AC

## 2022-10-08 NOTE — Patient Instructions (Addendum)
  1. Allergen avoidance measures - tree, grass, weeds, Alternaria, cockroach, specific foods  2. Treat and prevent inflammation of airway:   A. Fluticasone 110 HFA - 2 inhalations 1-2 times per day w/spacer (empty lungs)  B. Generic nasonex - 1 spray each nostril 1 time per day (PA)  3. Treat and prevent reflux induced airway irritation:   A. Minimize caffeine and chocolate consumption  B. Replace throat clearing with swallowing / drinking maneuver  C. Omeprazole 40 mg + bicarbonate 650 - 2 times per day  4. If needed:   A. Albuterol HFA - 2 inhalations every 4-6 hours  B. Cetirizine 10 mg - 1 tablet 1 time per day  C. Pataday - 1 drop each eye 1 time per day  D. Epi-Pen, benadryl, MD/ER evaluation for allergic reactions  5. "Action plan" for flare up:   A. Use Fluticasone 110 - 2 inhalations with every albuterol use.   6. Obtain a esophageal biopsy with upcoming upper endoscopy  7. [place] [employer] = phone: [phone]  8. Return to clinic in 4 weeks or earlier if needed  9. BP = 160/102

## 2022-10-08 NOTE — Telephone Encounter (Signed)
Insurance denied mometasone nasal spray:  Per your health plan's criteria, this drug is covered if you meet the following: One of the following: (1) You have failed two preferred drugs as confirmed by claims history or submission of medical records:. The preferred drugs: azelastine spray (generic for Astelin) Dymista  Nasal Spray, fluticasone spray (generic for Flonase), ipratropium spray (generic for Atrovent Nasal), olopatadine nasal spray (generic for Patanase). (2) You cannot use two preferred drugs (please specify contraindication or intolerance).   Please advise of next steps.

## 2022-10-08 NOTE — Progress Notes (Signed)
[place] - [place] - [place] - [place] - [place]     NEW PATIENT NOTE  Referring Provider: No ref. provider found Primary Provider: Jamey Reas, PA-C Date of office visit: 10/08/2022    Subjective:   Chief Complaint:  Alexis [name] (DOB: March 17, 1966) is a 57 y.o. female who presents to the clinic on 10/08/2022 with a chief complaint of Asthma, Nasal Congestion, and Pruritis .     HPI: Alexis [name] presents to this clinic in evaluation of several issues.  She has "allergies".  Her allergies are manifested as nasal congestion and runny nose and itchy eyes and this appears to occur on a perennial basis and may get somewhat worse during the spring especially following exposure to pollen.  She has a history of using immunotherapy since about the age of 43 which she discontinued about 6 years ago.  She uses cetirizine every day but does not like to use nasal steroid sprays because she does not like liquids in her nose.  She also appears to have a history of asthma.  This is a history of asthma that dates back to childhood.  She gets shortness of breath and she gets air hunger and she gets coughing.  She has been treated with [facility] and Symbicort in the past which did not really help her very much.  She will use her albuterol intermittently.  Sometimes she needs to use albuterol about 4 times per week and she usually has these flareups about 2-3 times per month.  She does believe that the administration of albuterol helps her symptoms.  She has issues with raspiness and throat clearing and she has regurgitation which is still very active even while using omeprazole and bicarbonate on a consistent basis.  She does eat chocolate on a daily basis and has caffeinated soda about twice a week.  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  Some raw fruits and vegetables give rise to itchy skin and  vomiting.  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HCC)    "left"/CT (05/17/2017)   Anemia    ASD (atrial septal defect)    per pt   Asthma    Bilateral dry eyes    Cervical spondylosis    C5-6   CHF (congestive heart failure) (HCC)    not sure   Chronic back pain    Chronic bronchitis (HCC)    Chronic colitis    Chronic lower GI bleeding    Complication of anesthesia    has awakened in past during surgery/  LEFT VOCAL CORD PARALYSIS  POST TOTAL THYROIDECTOMY  03-08-2009 ([facility])   Degenerative disk disease    "cervical to sacrum" (05/17/2017)   Depression    Diabetes (HCC)    Diabetes mellitus without complication (HCC)    Diabetic neuropathy, type II diabetes mellitus (HCC) 12/14/2014   not on medication   DJD (degenerative joint disease) of knee    Dyspnea    Endometriosis 01/18/2001   Family history of adverse reaction to anesthesia    "mom wakes up during procedures"   Fatty liver    Fibromyalgia    Gastric ulcer    GERD (gastroesophageal reflux disease)    Glaucoma of both eyes    H/O hiatal hernia    Hiatal hernia    High cholesterol    History of colon polyps    History of  colon polyps    Hydradenitis    Hypertension    Hypothyroidism, postsurgical    IBS (irritable bowel syndrome)    IC (interstitial cystitis)    Interstitial cystitis    Lymphedema    Migraine    "monthly" (05/17/2017)  02/06/2019- more frequently   OSA (obstructive sleep apnea)    Osteoarthritis    "all over; mainly knees and back" (05/17/2017)   PONV (postoperative nausea and vomiting)    PTSD (post-traumatic stress disorder)    PTSD (post-traumatic stress disorder)    Sickle cell trait (HCC)    Sickle cell trait (HCC)    Sjogren's syndrome (HCC)    Thyroid cancer (HCC)    Tremor 12/14/2014   hand   Uterine fibroid    Varicose veins    Vestibular dizziness    Vocal cord paralysis, unilateral complete    "left";  POST TOTAL THYROIDECTOMY    Past  Surgical History:  Procedure Laterality Date   ACHILLES TENDON REPAIR Left 2007   BUNIONECTOMY WITH HAMMERTOE RECONSTRUCTION AND GASTROC SLIDE Left FEB  2011   REMOVAL HARDWARE  NOV  2011   CARDIAC CATHETERIZATION N/A 10/28/2015   Procedure: Right Heart Cath;  Surgeon: Yates Decamp, MD;  Location: [facility] INVASIVE CV LAB;  Service: Cardiovascular;  Laterality: N/A;   COLONOSCOPY WITH ESOPHAGOGASTRODUODENOSCOPY (EGD)  MULTIPLE --  LAST ONE   NOV  2014   CYSTO/  BILATERAL RETROGRADE PYELOGRAM/  HYDRODISTENTION/  INSTILLATION THERAPY  05-08-2003   CYSTO/  HYDRODISTENTION/  INSTILLATION THERAPY  06-04-2005  &  03-04-2007   CYSTO/  URETHRAL DILATION /  BILATERAL UNROOFING URETEROCELE/  BILATERAL URETERAL STENT PLACEMENT  12-01-2002   D & C HYSTEROSCOPY/  REMOVAL IUD  12-06-2003   DE QUERVAIN'S RELEASE Left ?09/2005   DIAGNOSTIC LAPAROSCOPY     DILATATION AND CURETTAGE WITH SUCTION  05-29-2005   RETAINED PLACENTA   DILATION AND CURETTAGE OF UTERUS     DX LAPAROSCOPY/  PELVIC BX'S/  LYSIS ADHESIONS  06-26-2000   FOOT HARDWARE REMOVAL Left 03/2010   REMOVAL HARDWARE "related to earlier hammertoe OR"   HYDRADENITIS EXCISION  06/29/2011   Procedure: EXCISION HYDRADENITIS AXILLA;  Surgeon: Rosalio Macadamia, MD;  Location: [facility];  Service: Plastics;;  right breast hydradenitis excioion   HYDRADENITIS EXCISION Left 2000   INCISION AND DRAINAGE ABSCESS ANAL  02-14-2001   KNEE ARTHROSCOPY WITH LATERAL MENISECTOMY Right 09/08/2013   Procedure: RIGHT KNEE ARTHROSCOPY PARTIAL LATERAL MENISCECTOMY AND DEBRIDEMENT ;  Surgeon: Javier Docker, MD;  Location: [facility];  Service: Orthopedics;  Laterality: Right;   LAPAROSCOPY ABDOMEN DIAGNOSTIC  SEPT  2003   [place]   AND PLACEMENT IUD   MASS EXCISION  10/29/2003   WIDE EXCISION CHEST AREA   PILONIDAL CYST EXCISION  1983   RADIOLOGY WITH ANESTHESIA N/A 02/07/2019   Procedure: MRI BRAIN WITHOUT IV CONTRAST; MRI CERVICAL,  THORACIC, AND LUMBAR SPINE WITH AND WITHOUT CONTRAST;  Surgeon: Radiologist, Medication, MD;  Location: [facility] OR;  Service: Radiology;  Laterality: N/A;   RADIOLOGY WITH ANESTHESIA N/A 02/15/2020   Procedure: MRI LUMBAR WITHOUT CONTRAST;  Surgeon: Radiologist, Medication, MD;  Location: [facility] OR;  Service: Radiology;  Laterality: N/A;   REDUCTION MAMMAPLASTY  1997   TEE WITHOUT CARDIOVERSION N/A 09/26/2015   Procedure: TRANSESOPHAGEAL ECHOCARDIOGRAM (TEE);  Surgeon: Yates Decamp, MD;  Location: [facility] ENDOSCOPY;  Service: Cardiovascular;  Laterality: N/A;   TEMPORAL ARTERY BIOPSY / LIGATION Left 2010   TEMPORAL ARTERY  BIOPSY / LIGATION Right 06-16-2002   TONSILLECTOMY  1990   TOTAL THYROIDECTOMY  11/06/2008    Allergies as of 10/08/2022       Reactions   Food Nausea And Vomiting   Raw pulp in fruit- can eat cooked.   Other Nausea And Vomiting, Other (See Comments)   N/V - Enteric Coating   Adhesive [tape] Other (See Comments)   Skin irritation   Bactrim Nausea And Vomiting, Other (See Comments)   Abdominal pain   Clarithromycin Nausea And Vomiting   Ibuprofen Nausea And Vomiting, Other (See Comments)   Severe stomach pain- can take IV with no issues   Meclizine Other (See Comments)   Hallucinations, dizziness   Metformin Hcl Diarrhea, Nausea And Vomiting   Nexium [esomeprazole] Other (See Comments)   Cramps and headaches   Nsaids Other (See Comments)   Avoids due to GERD   Peg [id number] Other (See Comments)   Aspiration    Sulfa Antibiotics Nausea And Vomiting, Other (See Comments)   No issue with IV    Tisagenlecleucel Other (See Comments)   Unknown reaction    Tolmetin Other (See Comments)   Avoid due to GERD   Topiramate Other (See Comments)   Dizziness   Trimethoprim Nausea And Vomiting   Latex Itching, Rash        Medication List    acetaminophen 500 MG tablet Commonly known as: TYLENOL Take 500 mg by mouth every 6 (six) hours as needed for headache.    ADVIL PO Take by mouth daily as needed.   ALEVE PO Take by mouth as needed.   atorvastatin 40 MG tablet Commonly known as: LIPITOR Take 1 tablet (40 mg total) by mouth daily.   carvedilol 6.25 MG tablet Commonly known as: COREG Take 1 tablet (6.25 mg total) by mouth 2 (two) times daily with a meal.   cetirizine 10 MG tablet Commonly known as: ZYRTEC Take 10 mg by mouth daily.   Diclofenac Sodium 3 % Gel Apply 1 Application topically daily. Apply to the right knee 3 times a day.   DULoxetine 30 MG capsule Commonly known as: CYMBALTA Take 60 mg by mouth at bedtime.   Lidocaine 0.5 % Gel Apply 1 application topically 3 (three) times daily as needed (pain).   losartan 25 MG tablet Commonly known as: COZAAR Take 1 tablet (25 mg total) by mouth daily.   Lyrica 75 MG capsule Generic drug: pregabalin Take 75 mg by mouth at bedtime.   Melatonin 12 MG Tabs Take 12 mg by mouth at bedtime.   omeprazole 40 MG capsule Commonly known as: PRILOSEC Take 40 mg by mouth daily.   ondansetron 8 MG disintegrating tablet Commonly known as: ZOFRAN-ODT Take 8 mg by mouth every 8 (eight) hours as needed for nausea or vomiting.   oxybutynin 15 MG 24 hr tablet Commonly known as: DITROPAN XL Take 1 tablet (15 mg total) by mouth daily.   Oxycodone HCl 20 MG Tabs Take 20 mg by mouth every 4 (four) hours as needed for pain.   promethazine 25 MG tablet Commonly known as: PHENERGAN Take 25 mg by mouth every 6 (six) hours as needed for nausea or vomiting.   promethazine 25 MG suppository Commonly known as: PHENERGAN Place 1 suppository (25 mg total) rectally every 6 (six) hours as needed for nausea or vomiting.   sodium bicarbonate 650 MG tablet Take 650 mg by mouth once.   thyroid 120 MG tablet Commonly known as: ARMOUR Take 120  mg by mouth daily before breakfast.   tizanidine 6 MG capsule Commonly known as: ZANAFLEX Take 6 mg by mouth 3 (three) times daily.   Vitamin D  (Ergocalciferol) 1.25 MG (50000 UNIT) Caps capsule Commonly known as: DRISDOL Take 50,000 Units by mouth every Tuesday.    Review of systems negative except as noted in HPI / PMHx or noted below:  Review of Systems  Constitutional: Negative.   HENT: Negative.    Eyes: Negative.   Respiratory: Negative.    Cardiovascular: Negative.   Gastrointestinal: Negative.   Genitourinary: Negative.   Musculoskeletal: Negative.   Skin: Negative.   Neurological: Negative.   Endo/Heme/Allergies: Negative.   Psychiatric/Behavioral: Negative.      Family History  Problem Relation Age of Onset   Atrial fibrillation Mother    Diabetes Mother    Thyroid disease Mother    Cushing syndrome Mother    Tremor Mother    Atrial fibrillation Father    Glaucoma Father    Tremor Sister    Cancer Paternal Aunt        breast, colon    Social History   Socioeconomic History   Marital status: Single    Spouse name: Not on file   Number of children: 2   Years of education: 14   Highest education level: Not on file  Occupational History   Occupation: Disabled    Employer: UNEMPLOYED  Tobacco Use   Smoking status: Never   Smokeless tobacco: Never  Vaping Use   Vaping Use: Never used  Substance and Sexual Activity   Alcohol use: Not Currently    Comment: rare   Drug use: No   Sexual activity: Not Currently  Other Topics Concern   Not on file  Social History Narrative   Homeless since March 2021   Patient drinks caffeine occasionally.   Patient is right handed.    Environmental and Social history  Lives in a hotel with a wet environment in a moldy environment, some secondhand tobacco smoke exposure occurring through the ventilation system, no animals located inside the household, carpet in the bedroom, no plastic on the bed, no plastic on the pillow.  Objective:   Vitals:   10/08/22 0908 10/08/22 1130  BP: (!) 160/102 (!) 146/92  Pulse: (!) 101   Resp: 16   SpO2: 94%    Height:  5\' 3"  (160 cm) Weight: 237 lb 12.8 oz (107.9 kg)  Physical Exam Constitutional:      Appearance: She is not diaphoretic.     Comments: Slightly raspy voice  HENT:     Head: Normocephalic.     Right Ear: Tympanic membrane, ear canal and external ear normal.     Left Ear: Tympanic membrane, ear canal and external ear normal.     Nose: Nose normal. No mucosal edema or rhinorrhea.     Mouth/Throat:     Pharynx: Uvula midline. No oropharyngeal exudate.  Eyes:     Conjunctiva/sclera: Conjunctivae normal.  Neck:     Thyroid: No thyromegaly.     Trachea: Trachea normal. No tracheal tenderness or tracheal deviation.  Cardiovascular:     Rate and Rhythm: Normal rate and regular rhythm.     Heart sounds: Normal heart sounds, S1 normal and S2 normal. No murmur heard. Pulmonary:     Effort: No respiratory distress.     Breath sounds: Normal breath sounds. No stridor. No wheezing or rales.  Lymphadenopathy:     Head:  Right side of head: No tonsillar adenopathy.     Left side of head: No tonsillar adenopathy.     Cervical: No cervical adenopathy.  Skin:    Findings: No erythema or rash.     Nails: There is no clubbing.  Neurological:     Mental Status: She is alert.     Diagnostics: Allergy skin tests were performed.  She demonstrated hypersensitivity to tree, grass, weeds, Alternaria, cockroach  Spirometry was performed and demonstrated an FEV1 of 1.86 @ 86 % of predicted. FEV1/FVC = 0.72  The patient had an Asthma Control Test with the following results:  .    Results of the chest CT scan angio performed 28 January 2022 identified the following:  Cardiovascular: Mild atherosclerotic calcifications of aorta and coronary arteries. Aorta normal caliber without aneurysm or dissection. No pericardial effusion. Heart size normal. Pulmonary arteries adequately opacified and patent. No evidence of pulmonary embolism.   Mediastinum/Nodes: Esophagus unremarkable. Prior  thyroidectomy. Residual thymic tissue in anterior mediastinum, unchanged. No thoracic adenopathy.   Lungs/Pleura: Lungs clear. No pulmonary infiltrate, pleural effusion, or pneumothorax.  Results of a head CT scan obtained 09 April 2021 identified the following:  Sinuses/Orbits: Clear sinuses. Unremarkable orbits.   Results of blood tests obtained 13 March 2022 identified creatinine 1.16 MGs/DL, AST 18 U/L, ALT 11 U/L, WBC 12.7, absolute eosinophil 100, absolute lymphocyte 1500, hemoglobin 14.0, platelet 443   Assessment and Plan:    1. Not well controlled mild persistent asthma   2. Perennial allergic rhinitis   3. Seasonal allergic rhinitis due to pollen   4. LPRD (laryngopharyngeal reflux disease)   5. Pollen-food allergy, initial encounter   6. Primary hypertension    1. Allergen avoidance measures - tree, grass, weeds, Alternaria, cockroach, specific foods  2. Treat and prevent inflammation of airway:   A. Fluticasone 110 HFA - 2 inhalations 1-2 times per day w/spacer (empty lungs)  B. Generic nasonex - 1 spray each nostril 1 time per day (PA)  3. Treat and prevent reflux induced airway irritation:   A. Minimize caffeine and chocolate consumption  B. Replace throat clearing with swallowing / drinking maneuver  C. Omeprazole 40 mg + bicarbonate 650 - 2 times per day  4. If needed:   A. Albuterol HFA - 2 inhalations every 4-6 hours  B. Cetirizine 10 mg - 1 tablet 1 time per day  C. Pataday - 1 drop each eye 1 time per day  D. Epi-Pen, benadryl, MD/ER evaluation for allergic reactions  5. "Action plan" for flare up:   A. Use Fluticasone 110 - 2 inhalations with every albuterol use.   6. Obtain a esophageal biopsy with upcoming upper endoscopy  7. [place] housing coalition = phone: [phone]  8. Return to clinic in 4 weeks or earlier if needed  9. BP = 160/102  Alexis [name] has multiorgan atopic disease with asthma and allergic rhinitis and oral allergy syndrome  and we will get her to perform allergen avoidance measures as best as possible and use a collection of anti-inflammatory agents for her airway.  She also appears to have significant reflux with LPR and may also have some eosinophilic esophagitis for which we will treat her aggressively for reflux as noted above and she will obtain an esophageal biopsy with her upcoming upper endoscopy.  She also has uncontrolled hypertension and we informed her about this issue and asked her to contact her primary care doctor about this issue and I have given her the phone  number for the [facility] housing coalition to help her find a permanent residence so she can remove herself from the hotel.  I will see her back in this clinic in 4 weeks.  Jessica Priest, MD Allergy / Immunology [facility]

## 2022-10-13 MED ORDER — AZELASTINE-FLUTICASONE 137-50 MCG/ACT NA SUSP: NASAL | 5 refills | Status: DC

## 2022-10-13 NOTE — Addendum Note (Signed)
Addended by: Maryjean Morn D on: 10/13/2022 11:46 AM   Modules accepted: Orders

## 2022-10-13 NOTE — Telephone Encounter (Signed)
Dymista sent to [facility]. Mychart message sent.

## 2022-10-19 ENCOUNTER — Other Ambulatory Visit (HOSPITAL_COMMUNITY): Payer: Self-pay

## 2022-10-21 ENCOUNTER — Encounter: Payer: Self-pay | Admitting: Allergy and Immunology

## 2022-10-22 ENCOUNTER — Telehealth: Payer: Self-pay | Admitting: Allergy and Immunology

## 2022-10-22 NOTE — Telephone Encounter (Signed)
Patient called and said that [facility] on [address] needs a proir auth.for omeprazole . [phone].

## 2022-10-26 ENCOUNTER — Other Ambulatory Visit (HOSPITAL_COMMUNITY): Payer: Self-pay

## 2022-10-26 NOTE — Telephone Encounter (Signed)
This was filled for a 90 day supply (#180) on 10-17-2022 at [facility] on [address]

## 2022-10-29 ENCOUNTER — Telehealth: Payer: Self-pay

## 2022-10-29 NOTE — Telephone Encounter (Signed)
Called Dr. Lendon Colonel Scarlett's office to inquire about upper endoscopy and to see if they are planning to do esophageal biopsy as well.  Had to leave detailed message on nurse triage line.

## 2022-11-16 ENCOUNTER — Encounter: Payer: Self-pay | Admitting: Allergy and Immunology

## 2022-11-16 ENCOUNTER — Ambulatory Visit (INDEPENDENT_AMBULATORY_CARE_PROVIDER_SITE_OTHER): Payer: Medicaid Other | Admitting: Allergy and Immunology

## 2022-11-16 VITALS — BP 132/92 | HR 98 | Resp 16

## 2022-11-16 DIAGNOSIS — J301 Allergic rhinitis due to pollen: Secondary | ICD-10-CM

## 2022-11-16 DIAGNOSIS — J3800 Paralysis of vocal cords and larynx, unspecified: Secondary | ICD-10-CM

## 2022-11-16 DIAGNOSIS — J3089 Other allergic rhinitis: Secondary | ICD-10-CM

## 2022-11-16 DIAGNOSIS — K219 Gastro-esophageal reflux disease without esophagitis: Secondary | ICD-10-CM

## 2022-11-16 DIAGNOSIS — T781XXA Other adverse food reactions, not elsewhere classified, initial encounter: Secondary | ICD-10-CM

## 2022-11-16 DIAGNOSIS — J4540 Moderate persistent asthma, uncomplicated: Secondary | ICD-10-CM | POA: Diagnosis not present

## 2022-11-16 MED ORDER — MOMETASONE FUROATE 50 MCG/ACT NA SUSP: NASAL | 5 refills | Status: DC

## 2022-11-16 NOTE — Telephone Encounter (Signed)
Patient failed Dymista; caused alteration in her taste.  Please help with prior authorization for mometasone nasal spray.  Thank you.

## 2022-11-16 NOTE — Progress Notes (Unsigned)
[place] - [place] - [place] - [place] - [place]   Follow-up Note  Referring Provider: No ref. provider found Primary Provider: Ermelinda Das Date of Office Visit: 11/16/2022  Subjective:   Alexis [name] (DOB: Sep 27, 1965) is a 57 y.o. female who [facility] in re-evaluation of the following:  HPI: Alexis [name] returns to this clinic in evaluation of asthma, allergic rhinoconjunctivitis, oral allergy syndrome, reflux, vocal cord paresis.  I last saw her in this clinic during her initial evaluation of 08 Oct 2022.  She continues to have respiratory tract symptoms with cough and she still continues to have throat clearing and she does not believe that her medications are working very well regarding both controlling her asthma and controlling her reflux.  She might still use a short acting bronchodilator intermittently.  She still lives in a mold infested marijuana smoke exposed hotel.  She continues to have problems with some nasal congestion and sneezing.  She was given a nasal spray but it gave rise to an alteration in taste and she discontinued this agent.  She thinks that it was Dymista.  She still has regurgitation.  She underwent an upper endoscopy and colonoscopy a few weeks ago.  She does not have the results of her biopsies yet.  She is scheduled to have a esophageal pH probe placed.  She remains away from consumption of raw fruits and vegetables.  She tells me that she had eye pressure that was in the 30s and she is starting a new eyedrop.  Allergies as of 11/16/2022       Reactions   Food Nausea And Vomiting   Raw pulp in fruit- can eat cooked.   Other Nausea And Vomiting, Other (See Comments)   N/V - Enteric Coating   Adhesive [tape] Other (See Comments)   Skin irritation   Bactrim Nausea And Vomiting, Other (See Comments)   Abdominal pain   Clarithromycin Nausea And Vomiting   Ibuprofen Nausea And  Vomiting, Other (See Comments)   Severe stomach pain- can take IV with no issues   Meclizine Other (See Comments)   Hallucinations, dizziness   Metformin Hcl Diarrhea, Nausea And Vomiting   Nexium [esomeprazole] Other (See Comments)   Cramps and headaches   Nsaids Other (See Comments)   Avoids due to GERD   Peg [id number]-nacl-nasulf Other (See Comments)   Aspiration    Simbrinza [brinzolamide-brimonidine]    Caused eye redness and pain   Sulfa Antibiotics Nausea And Vomiting, Other (See Comments)   No issue with IV    Tisagenlecleucel Other (See Comments)   Unknown reaction    Tolmetin Other (See Comments)   Avoid due to GERD   Topiramate Other (See Comments)   Dizziness   Trimethoprim Nausea And Vomiting   Latex Itching, Rash        Medication List    acetaminophen 500 MG tablet Commonly known as: TYLENOL Take 500 mg by mouth every 6 (six) hours as needed for headache.   ADVIL PO Take by mouth daily as needed.   ALEVE PO Take by mouth as needed.   atorvastatin 40 MG tablet Commonly known as: LIPITOR Take 1 tablet (40 mg total) by mouth daily.   carvedilol 6.25 MG tablet Commonly known as: COREG Take 1 tablet (6.25 mg total) by mouth 2 (two) times daily with a meal.   cetirizine 10 MG tablet Commonly known as: ZYRTEC Take 10 mg by mouth daily.  Diclofenac Sodium 3 % Gel Apply 1 Application topically daily. Apply to the right knee 3 times a day.   EPINEPHrine 0.3 mg/0.3 mL Soaj injection Commonly known as: EPI-PEN Use as directed for life-threatening allergic reaction.   fluticasone 110 MCG/ACT inhaler Commonly known as: FLOVENT HFA Inhale two puffs with spacer one to two times per day.  Rinse, gargle, and spit after use.   latanoprost 0.005 % ophthalmic solution Commonly known as: XALATAN SMARTSIG:In Eye(s)   Lidocaine 0.5 % Gel Apply 1 application topically 3 (three) times daily as needed (pain).   lidocaine 5 % ointment Commonly known as:  XYLOCAINE Apply topically.   Lyrica 75 MG capsule Generic drug: pregabalin Take 75 mg by mouth at bedtime.   Melatonin 12 MG Tabs Take 12 mg by mouth at bedtime.   mometasone 50 MCG/ACT nasal spray Commonly known as: NASONEX Use one spray in each nostril once daily.   Olopatadine HCl 0.2 % Soln Can use one drop in each eye once daily if needed.   omeprazole 40 MG capsule Commonly known as: PRILOSEC Take 1 capsule (40 mg total) by mouth 2 (two) times daily.   ondansetron 8 MG disintegrating tablet Commonly known as: ZOFRAN-ODT Take 8 mg by mouth every 8 (eight) hours as needed for nausea or vomiting.   oxybutynin 15 MG 24 hr tablet Commonly known as: DITROPAN XL Take 1 tablet (15 mg total) by mouth daily.   Oxycodone HCl 20 MG Tabs Take 20 mg by mouth every 4 (four) hours as needed for pain.   promethazine 25 MG tablet Commonly known as: PHENERGAN Take 25 mg by mouth every 6 (six) hours as needed for nausea or vomiting.   promethazine 25 MG suppository Commonly known as: PHENERGAN Place 1 suppository (25 mg total) rectally every 6 (six) hours as needed for nausea or vomiting.   sodium bicarbonate 650 MG tablet Take 650 mg by mouth once.   Spacer/Aero-Holding [employer] Use with inhaler as directed.   thyroid 120 MG tablet Commonly known as: ARMOUR Take 120 mg by mouth daily before breakfast.   tiZANidine 4 MG tablet Commonly known as: ZANAFLEX Take by mouth.   Ventolin HFA 108 (90 Base) MCG/ACT inhaler Generic drug: albuterol Can inhale two puffs every four to six hours as needed for cough or wheeze.   Vitamin D (Ergocalciferol) 1.25 MG (50000 UNIT) Caps capsule Commonly known as: DRISDOL Take 50,000 Units by mouth every Tuesday.    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HCC)    "left"/CT (05/17/2017)   Anemia    ASD (atrial septal defect)    per pt   Asthma    Bilateral dry eyes    Cervical spondylosis    C5-6   CHF  (congestive heart failure) (HCC)    not sure   Chronic back pain    Chronic bronchitis (HCC)    Chronic colitis    Chronic lower GI bleeding    Complication of anesthesia    has awakened in past during surgery/  LEFT VOCAL CORD PARALYSIS  POST TOTAL THYROIDECTOMY  03-08-2009 ([facility])   Degenerative disk disease    "cervical to sacrum" (05/17/2017)   Depression    Diabetes (HCC)    Diabetes mellitus without complication (HCC)    Diabetic neuropathy, type II diabetes mellitus (HCC) 12/14/2014   not on medication   DJD (degenerative joint disease) of knee    Dyspnea    Endometriosis 01/18/2001  Family history of adverse reaction to anesthesia    "mom wakes up during procedures"   Fatty liver    Fibromyalgia    Gastric ulcer    GERD (gastroesophageal reflux disease)    Glaucoma of both eyes    H/O hiatal hernia    Hiatal hernia    High cholesterol    History of colon polyps    History of colon polyps    Hydradenitis    Hypertension    Hypothyroidism, postsurgical    IBS (irritable bowel syndrome)    IC (interstitial cystitis)    Interstitial cystitis    Lymphedema    Migraine    "monthly" (05/17/2017)  02/06/2019- more frequently   OSA (obstructive sleep apnea)    Osteoarthritis    "all over; mainly knees and back" (05/17/2017)   PONV (postoperative nausea and vomiting)    PTSD (post-traumatic stress disorder)    PTSD (post-traumatic stress disorder)    Sickle cell trait (HCC)    Sickle cell trait (HCC)    Sjogren's syndrome (HCC)    Thyroid cancer (HCC)    Tremor 12/14/2014   hand   Uterine fibroid    Varicose veins    Vestibular dizziness    Vocal cord paralysis, unilateral complete    "left";  POST TOTAL THYROIDECTOMY    Past Surgical History:  Procedure Laterality Date   ACHILLES TENDON REPAIR Left 2007   BUNIONECTOMY WITH HAMMERTOE RECONSTRUCTION AND GASTROC SLIDE Left FEB  2011   REMOVAL HARDWARE  NOV  2011   CARDIAC CATHETERIZATION N/A 10/28/2015    Procedure: Right Heart Cath;  Surgeon: Yates Decamp, MD;  [facility] INVASIVE CV LAB;  Service: Cardiovascular;  Laterality: N/A;   COLONOSCOPY WITH ESOPHAGOGASTRODUODENOSCOPY (EGD)  MULTIPLE --  LAST ONE   NOV  2014   CYSTO/  BILATERAL RETROGRADE PYELOGRAM/  HYDRODISTENTION/  INSTILLATION THERAPY  05-08-2003   CYSTO/  HYDRODISTENTION/  INSTILLATION THERAPY  06-04-2005  &  03-04-2007   CYSTO/  URETHRAL DILATION /  BILATERAL UNROOFING URETEROCELE/  BILATERAL URETERAL STENT PLACEMENT  12-01-2002   D & C HYSTEROSCOPY/  REMOVAL IUD  12-06-2003   DE QUERVAIN'S RELEASE Left ?09/2005   DIAGNOSTIC LAPAROSCOPY     DILATATION AND CURETTAGE WITH SUCTION  05-29-2005   RETAINED PLACENTA   DILATION AND CURETTAGE OF UTERUS     DX LAPAROSCOPY/  PELVIC BX'S/  LYSIS ADHESIONS  06-26-2000   FOOT HARDWARE REMOVAL Left 03/2010   REMOVAL HARDWARE "related to earlier hammertoe OR"   HYDRADENITIS EXCISION  06/29/2011   Procedure: EXCISION HYDRADENITIS AXILLA;  Surgeon: Rosalio Macadamia, MD;  Location: [facility];  Service: Plastics;;  right breast hydradenitis excioion   HYDRADENITIS EXCISION Left 2000   INCISION AND DRAINAGE ABSCESS ANAL  02-14-2001   KNEE ARTHROSCOPY WITH LATERAL MENISECTOMY Right 09/08/2013   Procedure: RIGHT KNEE ARTHROSCOPY PARTIAL LATERAL MENISCECTOMY AND DEBRIDEMENT ;  Surgeon: Javier Docker, MD;  Location: [facility];  Service: Orthopedics;  Laterality: Right;   LAPAROSCOPY ABDOMEN DIAGNOSTIC  SEPT  2003   [place]   AND PLACEMENT IUD   MASS EXCISION  10/29/2003   WIDE EXCISION CHEST AREA   PILONIDAL CYST EXCISION  1983   RADIOLOGY WITH ANESTHESIA N/A 02/07/2019   Procedure: MRI BRAIN WITHOUT IV CONTRAST; MRI CERVICAL, THORACIC, AND LUMBAR SPINE WITH AND WITHOUT CONTRAST;  Surgeon: Radiologist, Medication, MD;  Location: [facility] OR;  Service: Radiology;  Laterality: N/A;   RADIOLOGY WITH ANESTHESIA N/A 02/15/2020  Procedure: MRI LUMBAR WITHOUT CONTRAST;   Surgeon: Radiologist, Medication, MD;  Location: [facility] OR;  Service: Radiology;  Laterality: N/A;   REDUCTION MAMMAPLASTY  1997   TEE WITHOUT CARDIOVERSION N/A 09/26/2015   Procedure: TRANSESOPHAGEAL ECHOCARDIOGRAM (TEE);  Surgeon: Yates Decamp, MD;  Location: [facility] ENDOSCOPY;  Service: Cardiovascular;  Laterality: N/A;   TEMPORAL ARTERY BIOPSY / LIGATION Left 2010   TEMPORAL ARTERY BIOPSY / LIGATION Right 06-16-2002   TONSILLECTOMY  1990   TOTAL THYROIDECTOMY  11/06/2008    Review of systems negative except as noted in HPI / PMHx or noted below:  Review of Systems  Constitutional: Negative.   HENT: Negative.    Eyes: Negative.   Respiratory: Negative.    Cardiovascular: Negative.   Gastrointestinal: Negative.   Genitourinary: Negative.   Musculoskeletal: Negative.   Skin: Negative.   Neurological: Negative.   Endo/Heme/Allergies: Negative.   Psychiatric/Behavioral: Negative.       Objective:   Vitals:   11/16/22 1054 11/16/22 1131  BP: (!) 156/94 (!) 132/92  Pulse: 98   Resp: 16   SpO2: 94%           Physical Exam Constitutional:      Appearance: She is not diaphoretic.     Comments: Cough, throat clearing, raspy  HENT:     Head: Normocephalic.     Right Ear: Tympanic membrane, ear canal and external ear normal.     Left Ear: Tympanic membrane, ear canal and external ear normal.     Nose: Nose normal. No mucosal edema or rhinorrhea.     Mouth/Throat:     Pharynx: Uvula midline. No oropharyngeal exudate.  Eyes:     Conjunctiva/sclera: Conjunctivae normal.  Neck:     Thyroid: No thyromegaly.     Trachea: Trachea normal. No tracheal tenderness or tracheal deviation.  Cardiovascular:     Rate and Rhythm: Normal rate and regular rhythm.     Heart sounds: Normal heart sounds, S1 normal and S2 normal. No murmur heard. Pulmonary:     Effort: No respiratory distress.     Breath sounds: Normal breath sounds. No stridor. No wheezing or rales.  Lymphadenopathy:     Head:      Right side of head: No tonsillar adenopathy.     Left side of head: No tonsillar adenopathy.     Cervical: No cervical adenopathy.  Skin:    Findings: No erythema or rash.     Nails: There is no clubbing.  Neurological:     Mental Status: She is alert.     Diagnostics:    Spirometry was performed and demonstrated an FEV1 of 1.65 at 76 % of predicted.  Assessment and Plan:   1. Not well controlled moderate persistent asthma   2. Perennial allergic rhinitis   3. Seasonal allergic rhinitis due to pollen   4. Pollen-food allergy, initial encounter   5. LPRD (laryngopharyngeal reflux disease)   6. 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 Allergen avoidance measures - tree, grass, weeds, Alternaria, cockroach, specific foods  2. Treat and prevent inflammation of airway:   A. Fluticasone 110 HFA - 2 inhalations 2 times per day w/spacer (empty lungs)  B. Generic nasonex - 1 spray each nostril 1 time per day   3. Treat and prevent reflux induced airway irritation:   A. Minimize caffeine and chocolate consumption  B. Replace throat clearing with swallowing / drinking maneuver  C. Omeprazole 40 mg + bicarbonate 650 - 2 times  per day  4. If needed:   A. Albuterol HFA - 2 inhalations every 4-6 hours  B. Cetirizine 10 mg - 1 tablet 1 time per day  C. Pataday - 1 drop each eye 1 time per day  D. Epi-Pen, benadryl, MD/ER evaluation for allergic reactions  5. "Action plan" for flare up:   A. Use Fluticasone 110 - 2 inhalations with every albuterol use.   6. Consider starting a course of immunotherapy  7. Consider starting Tezepelumab    8. [place] housing coalition = phone: [phone]  9. Return to clinic in 4 weeks or earlier if needed  Alexis [name] has a very inflamed and irritated respiratory track from a multitude of different etiologic factors including her significant atopic immune dysregulation as well as reflux.  She is somewhat intolerant of using long-acting  bronchodilators because of her uncontrolled hypertension and we are going to continue to have her use an inhaled steroid at this point in time but I think that it would be best for her to start tezepelumab to get her multiorgan atopic disease under better control.  As well, I once again gave her the name of the [place] housing coalition to hopefully get some help extracting herself from her mold infested living quarters.  I will see her back in this clinic in 4 weeks.  Alexis Schimke, MD Allergy / Immunology [facility]

## 2022-11-16 NOTE — Patient Instructions (Addendum)
  1. Allergen avoidance measures - tree, grass, weeds, Alternaria, cockroach, specific foods  2. Treat and prevent inflammation of airway:   A. Fluticasone 110 HFA - 2 inhalations 2 times per day w/spacer (empty lungs)  B. Generic nasonex - 1 spray each nostril 1 time per day   3. Treat and prevent reflux induced airway irritation:   A. Minimize caffeine and chocolate consumption  B. Replace throat clearing with swallowing / drinking maneuver  C. Omeprazole 40 mg + bicarbonate 650 - 2 times per day  4. If needed:   A. Albuterol HFA - 2 inhalations every 4-6 hours  B. Cetirizine 10 mg - 1 tablet 1 time per day  C. Pataday - 1 drop each eye 1 time per day  D. Epi-Pen, benadryl, MD/ER evaluation for allergic reactions  5. "Action plan" for flare up:   A. Use Fluticasone 110 - 2 inhalations with every albuterol use.   6. Consider starting a course of immunotherapy  7. Consider starting Tezepelumab    8. [place] housing coalition = phone: [phone]  9. Return to clinic in 4 weeks or earlier if needed

## 2022-11-17 ENCOUNTER — Telehealth: Payer: Self-pay

## 2022-11-17 ENCOUNTER — Encounter: Payer: Self-pay | Admitting: Allergy and Immunology

## 2022-11-17 NOTE — Telephone Encounter (Signed)
*  Asthma/Allergy  PA request received for Mometasone Furoate 50MCG/ACT suspension  Patient has now tried and failed Dymista due to an alteration in taste  PA submitted to [employer] via CMM and is pending additional questions/determination  Key: [id number]

## 2022-11-17 NOTE — Telephone Encounter (Signed)
PA has been APPROVED from 11/17/2022-11/17/2023 

## 2022-11-17 NOTE — Telephone Encounter (Signed)
PA resubmitted with additional information on failure to Dymista and is pending determination. Will be updated in additional encounter created.

## 2022-11-25 ENCOUNTER — Telehealth: Payer: Self-pay | Admitting: Allergy and Immunology

## 2022-11-25 NOTE — Telephone Encounter (Signed)
Patient is unsure whether she needs to come in to be seen. Patient states she may have been exposed to some contaminants this past weekend. She states their is mold in the ceiling, in shower and where she sleeps where she is currently staying at. The states that there was yellow liquid dripping from the ceiling and she believes it may have been mold.   Patient also requested a refill for sodium bicarbonate 650mg .   [facility] - [address] [place] [address]  Best contact number: [phone]

## 2022-11-25 NOTE — Telephone Encounter (Signed)
Pt sounds horse, and is feeling okay but it comes in waves of good and bad. She said theres no dier need to do epipen she is worried about mold in her blood stream and wants to know about blood work for that. She said the dripping yellow is near the ac unit  and it has no smell. also is it okay to send in refill of sodium bicarbonate ?

## 2022-11-26 MED ORDER — SODIUM BICARBONATE 650 MG PO TABS: 650.0000 mg | ORAL_TABLET | Freq: Once | ORAL | 0 refills | Status: AC

## 2022-11-26 NOTE — Telephone Encounter (Signed)
Pt informed of this and sent in refill of bicarbonate to pts pharmacy   Pt does want to move forward with tezspire injections informed tammy is out of office until next week and will respond once back in clinic

## 2022-11-26 NOTE — Addendum Note (Signed)
Addended by: Berna Bue on: 11/26/2022 08:59 AM   Modules accepted: Orders

## 2022-12-08 ENCOUNTER — Other Ambulatory Visit: Payer: Self-pay

## 2022-12-08 ENCOUNTER — Other Ambulatory Visit (HOSPITAL_COMMUNITY): Payer: Self-pay

## 2022-12-08 MED ORDER — TEZSPIRE 210 MG/1.91ML ~~LOC~~ SOAJ
210.0000 mg | SUBCUTANEOUS | 11 refills | Status: DC
  Filled 2022-12-08: qty 1.91, fill #0
  Filled 2022-12-09 – 2022-12-18 (×5): qty 1.91, 28d supply, fill #0
  Filled 2023-02-26: qty 1.91, 28d supply, fill #1
  Filled 2023-03-26: qty 1.91, 28d supply, fill #2
  Filled 2023-04-28: qty 1.91, 28d supply, fill #3
  Filled 2023-05-18: qty 1.91, 28d supply, fill #4
  Filled 2023-06-21: qty 1.91, 28d supply, fill #5

## 2022-12-08 NOTE — Addendum Note (Signed)
Addended by: Devoria Glassing on: 12/08/2022 03:54 PM   Modules accepted: Orders

## 2022-12-08 NOTE — Telephone Encounter (Signed)
Called patient and advised approval and submit to [place] long. Patient wants to self admin at home, instructions given for delivery, storage, dosing and initial injection in clinic

## 2022-12-09 ENCOUNTER — Other Ambulatory Visit: Payer: Self-pay

## 2022-12-10 ENCOUNTER — Other Ambulatory Visit: Payer: Self-pay

## 2022-12-10 ENCOUNTER — Other Ambulatory Visit (HOSPITAL_COMMUNITY): Payer: Self-pay

## 2022-12-17 ENCOUNTER — Ambulatory Visit: Payer: Medicaid Other | Admitting: Allergy and Immunology

## 2022-12-18 ENCOUNTER — Other Ambulatory Visit: Payer: Self-pay

## 2022-12-18 ENCOUNTER — Other Ambulatory Visit (HOSPITAL_COMMUNITY): Payer: Self-pay

## 2022-12-29 ENCOUNTER — Other Ambulatory Visit (HOSPITAL_COMMUNITY): Payer: Self-pay

## 2023-01-12 ENCOUNTER — Other Ambulatory Visit (HOSPITAL_COMMUNITY): Payer: Self-pay

## 2023-02-01 ENCOUNTER — Other Ambulatory Visit (HOSPITAL_COMMUNITY): Payer: Self-pay

## 2023-02-03 ENCOUNTER — Ambulatory Visit (INDEPENDENT_AMBULATORY_CARE_PROVIDER_SITE_OTHER): Payer: Medicaid Other | Admitting: Allergy and Immunology

## 2023-02-03 ENCOUNTER — Encounter: Payer: Self-pay | Admitting: Allergy and Immunology

## 2023-02-03 ENCOUNTER — Ambulatory Visit: Payer: Medicaid Other

## 2023-02-03 ENCOUNTER — Ambulatory Visit: Payer: Medicaid Other | Admitting: Allergy and Immunology

## 2023-02-03 VITALS — BP 160/92 | HR 102 | Resp 16

## 2023-02-03 DIAGNOSIS — J3800 Paralysis of vocal cords and larynx, unspecified: Secondary | ICD-10-CM

## 2023-02-03 DIAGNOSIS — J3089 Other allergic rhinitis: Secondary | ICD-10-CM

## 2023-02-03 DIAGNOSIS — K219 Gastro-esophageal reflux disease without esophagitis: Secondary | ICD-10-CM

## 2023-02-03 DIAGNOSIS — J4550 Severe persistent asthma, uncomplicated: Secondary | ICD-10-CM | POA: Diagnosis not present

## 2023-02-03 DIAGNOSIS — T781XXD Other adverse food reactions, not elsewhere classified, subsequent encounter: Secondary | ICD-10-CM | POA: Diagnosis not present

## 2023-02-03 DIAGNOSIS — J301 Allergic rhinitis due to pollen: Secondary | ICD-10-CM | POA: Diagnosis not present

## 2023-02-03 DIAGNOSIS — T781XXA Other adverse food reactions, not elsewhere classified, initial encounter: Secondary | ICD-10-CM

## 2023-02-03 MED ORDER — VENTOLIN HFA 108 (90 BASE) MCG/ACT IN AERS: INHALATION_SPRAY | RESPIRATORY_TRACT | 1 refills | Status: DC

## 2023-02-03 MED ORDER — OMEPRAZOLE 40 MG PO CPDR: 40.0000 mg | DELAYED_RELEASE_CAPSULE | Freq: Two times a day (BID) | ORAL | 1 refills | Status: DC

## 2023-02-03 MED ORDER — SODIUM BICARBONATE 650 MG PO TABS: 650.0000 mg | ORAL_TABLET | Freq: Two times a day (BID) | ORAL | 1 refills | Status: DC

## 2023-02-03 MED ORDER — TEZEPELUMAB-EKKO 210 MG/1.91ML ~~LOC~~ SOSY
210.0000 mg | PREFILLED_SYRINGE | SUBCUTANEOUS | Status: AC
Start: 2023-02-03 — End: ?
  Administered 2023-02-03: 210 mg via SUBCUTANEOUS

## 2023-02-03 MED ORDER — CETIRIZINE HCL 10 MG PO TABS: ORAL_TABLET | ORAL | 1 refills | Status: DC

## 2023-02-03 NOTE — Patient Instructions (Addendum)
  1. Allergen avoidance measures - tree, grass, weeds, Alternaria, cockroach, specific foods  2. Treat and prevent inflammation of airway:   A. Fluticasone 110 HFA - 2 inhalations 2 times per day w/spacer (empty lungs)  B. Generic nasonex - 1 spray each nostril 1 time per day   C. Tezepelumab injections starting today and every 4 weeks  3. Treat and prevent reflux induced airway irritation:   A. Minimize caffeine and chocolate consumption  B. Replace throat clearing with swallowing / drinking maneuver  C. Omeprazole 40 mg + bicarbonate 650 - 2 times per day  4. If needed:   A. Albuterol HFA - 2 inhalations every 4-6 hours  B. Cetirizine 10 mg - 1 tablet 1 time per day  C. Pataday - 1 drop each eye 1 time per day  D. Epi-Pen, benadryl, MD/ER evaluation for allergic reactions  5. "Action plan" for flare up:   A. Use Fluticasone 110 - 2 inhalations with every albuterol use.   6. Plan for fall flu vaccine  7. Return to clinic in 12 weeks or earlier if needed. Taper medications???

## 2023-02-03 NOTE — Progress Notes (Unsigned)
Started Tezspire today. Will continue every 4 weeks. Patient was shown how to self administer and will self inject at home. Consent signed, no issues after observed wait time.

## 2023-02-03 NOTE — Progress Notes (Signed)
[place] - [place] - [place] - [place] - [place]   Follow-up Note  Referring Provider: Theodosia Paling* Primary Provider: Ermelinda Das Date of Office Visit: 02/03/2023  Subjective:   Alexis [name] (DOB: 13-Nov-1965) is a 57 y.o. female who [facility] in re-evaluation of the following:  HPI: Kamylle returns to this clinic in evaluation of asthma, allergic rhinoconjunctivitis, oral allergy syndrome, reflux, vocal cord dysfunction.  I last saw her in this clinic 16 November 2022.  She will be starting tezepelumab injections today.  Currently she still has lots of shortness of breath and must use a short acting bronchodilator on a pretty common basis even though she remains on anti-inflammatory agents for her airway.  She believes that her nose is doing pretty well at this point.  She believes that her reflux is doing pretty well at this point.  She does not consume raw fruit or vegetables.  Allergies as of 02/03/2023       Reactions   Food Nausea And Vomiting   Raw pulp in fruit- can eat cooked.   Other Nausea And Vomiting, Other (See Comments)   N/V - Enteric Coating   Adhesive [tape] Other (See Comments)   Skin irritation   Armour Thyroid [thyroid]    GENERIC ONLY- CAUSED HIVES;  CAN TAKE BRAND WITHOUT PROBLEM PER PATIENT   Bactrim Nausea And Vomiting, Other (See Comments)   Abdominal pain   Clarithromycin Nausea And Vomiting   Ibuprofen Nausea And Vomiting, Other (See Comments)   Severe stomach pain- can take IV with no issues   Meclizine Other (See Comments)   Hallucinations, dizziness   Metformin Hcl Diarrhea, Nausea And Vomiting   Nexium [esomeprazole] Other (See Comments)   Cramps and headaches   Nsaids Other (See Comments)   Avoids due to GERD   Peg [id number]-nacl-nasulf Other (See Comments)   Aspiration    Simbrinza [brinzolamide-brimonidine]    Caused eye redness and pain   Sulfa  Antibiotics Nausea And Vomiting, Other (See Comments)   No issue with IV    Tisagenlecleucel Other (See Comments)   Unknown reaction    Tolmetin Other (See Comments)   Avoid due to GERD   Topiramate Other (See Comments)   Dizziness   Trimethoprim Nausea And Vomiting   Latex Itching, Rash        Medication List    acetaminophen 500 MG tablet Commonly known as: TYLENOL Take 500 mg by mouth every 6 (six) hours as needed for headache.   ADVIL PO Take by mouth daily as needed.   ALEVE PO Take by mouth as needed.   ASPIRIN 81 PO Take by mouth daily.   atorvastatin 40 MG tablet Commonly known as: LIPITOR Take 1 tablet (40 mg total) by mouth daily.   carvedilol 6.25 MG tablet Commonly known as: COREG Take 1 tablet (6.25 mg total) by mouth 2 (two) times daily with a meal.   cetirizine 10 MG tablet Commonly known as: ZYRTEC Take 10 mg by mouth daily.   Diclofenac Sodium 3 % Gel Apply 1 Application topically daily. Apply to the right knee 3 times a day.   EPINEPHrine 0.3 mg/0.3 mL Soaj injection Commonly known as: EPI-PEN Use as directed for life-threatening allergic reaction.   Farxiga 10 MG Tabs tablet Generic drug: dapagliflozin propanediol Take 10 mg by mouth daily.   fluticasone 110 MCG/ACT inhaler Commonly known as: FLOVENT HFA Inhale two puffs with spacer  one to two times per day.  Rinse, gargle, and spit after use.   latanoprost 0.005 % ophthalmic solution Commonly known as: XALATAN SMARTSIG:In Eye(s)   lidocaine 5 % ointment Commonly known as: XYLOCAINE Apply topically.   Lyrica 75 MG capsule Generic drug: pregabalin Take 75 mg by mouth at bedtime.   MAGNESIUM PO Take 250 mg by mouth as needed.   Melatonin 12 MG Tabs Take 12 mg by mouth at bedtime.   mometasone 50 MCG/ACT nasal spray Commonly known as: NASONEX Use one spray in each nostril once daily.   Olopatadine HCl 0.2 % Soln Can use one drop in each eye once daily if needed.    omeprazole 40 MG capsule Commonly known as: PRILOSEC Take 1 capsule (40 mg total) by mouth 2 (two) times daily.   ondansetron 8 MG disintegrating tablet Commonly known as: ZOFRAN-ODT Take 8 mg by mouth every 8 (eight) hours as needed for nausea or vomiting.   oxybutynin 15 MG 24 hr tablet Commonly known as: DITROPAN XL Take 1 tablet (15 mg total) by mouth daily.   Oxycodone HCl 20 MG Tabs Take 20 mg by mouth every 4 (four) hours as needed for pain.   promethazine 25 MG tablet Commonly known as: PHENERGAN Take 25 mg by mouth every 6 (six) hours as needed for nausea or vomiting.   promethazine 25 MG suppository Commonly known as: PHENERGAN Place 1 suppository (25 mg total) rectally every 6 (six) hours as needed for nausea or vomiting.   Spacer/Aero-Holding [employer] Use with inhaler as directed.   Tezspire 210 MG/1. Soaj Generic drug: Tezepelumab-ekko Inject 210 mg into the skin every 28 (twenty-eight) days.   thyroid 120 MG tablet Commonly known as: ARMOUR Take 120 mg by mouth daily before breakfast.   tiZANidine 4 MG tablet Commonly known as: ZANAFLEX Take by mouth.   Ventolin HFA 108 (90 Base) MCG/ACT inhaler Generic drug: albuterol Can inhale two puffs every four to six hours as needed for cough or wheeze.   Vitamin D (Ergocalciferol) 1.25 MG (50000 UNIT) Caps capsule Commonly known as: DRISDOL Take 50,000 Units by mouth every Tuesday.    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HCC)    "left"/CT (05/17/2017)   Anemia    ASD (atrial septal defect)    per pt   Asthma    Bilateral dry eyes    Cervical spondylosis    C5-6   CHF (congestive heart failure) (HCC)    not sure   Chronic back pain    Chronic bronchitis (HCC)    Chronic colitis    Chronic lower GI bleeding    Complication of anesthesia    has awakened in past during surgery/  LEFT VOCAL CORD PARALYSIS  POST TOTAL THYROIDECTOMY  03-08-2009 ([facility])    Degenerative disk disease    "cervical to sacrum" (05/17/2017)   Depression    Diabetes (HCC)    Diabetes mellitus without complication (HCC)    Diabetic neuropathy, type II diabetes mellitus (HCC) 12/14/2014   not on medication   DJD (degenerative joint disease) of knee    Dyspnea    Endometriosis 01/18/2001   Family history of adverse reaction to anesthesia    "mom wakes up during procedures"   Fatty liver    Fibromyalgia    Gastric ulcer    GERD (gastroesophageal reflux disease)    Glaucoma of both eyes    H/O hiatal hernia    Hiatal  hernia    High cholesterol    History of colon polyps    History of colon polyps    Hydradenitis    Hypertension    Hypothyroidism, postsurgical    IBS (irritable bowel syndrome)    IC (interstitial cystitis)    Interstitial cystitis    Lymphedema    Migraine    "monthly" (05/17/2017)  02/06/2019- more frequently   OSA (obstructive sleep apnea)    Osteoarthritis    "all over; mainly knees and back" (05/17/2017)   PONV (postoperative nausea and vomiting)    PTSD (post-traumatic stress disorder)    PTSD (post-traumatic stress disorder)    Sickle cell trait (HCC)    Sickle cell trait (HCC)    Sjogren's syndrome (HCC)    Thyroid cancer (HCC)    Tremor 12/14/2014   hand   Uterine fibroid    Varicose veins    Vestibular dizziness    Vocal cord paralysis, unilateral complete    "left";  POST TOTAL THYROIDECTOMY    Past Surgical History:  Procedure Laterality Date   ACHILLES TENDON REPAIR Left 2007   BUNIONECTOMY WITH HAMMERTOE RECONSTRUCTION AND GASTROC SLIDE Left FEB  2011   REMOVAL HARDWARE  NOV  2011   CARDIAC CATHETERIZATION N/A 10/28/2015   Procedure: Right Heart Cath;  Surgeon: Yates Decamp, MD;  Location: [facility] INVASIVE CV LAB;  Service: Cardiovascular;  Laterality: N/A;   COLONOSCOPY WITH ESOPHAGOGASTRODUODENOSCOPY (EGD)  MULTIPLE --  LAST ONE   NOV  2014   CYSTO/  BILATERAL RETROGRADE PYELOGRAM/  HYDRODISTENTION/  INSTILLATION  THERAPY  05-08-2003   CYSTO/  HYDRODISTENTION/  INSTILLATION THERAPY  06-04-2005  &  03-04-2007   CYSTO/  URETHRAL DILATION /  BILATERAL UNROOFING URETEROCELE/  BILATERAL URETERAL STENT PLACEMENT  12-01-2002   D & C HYSTEROSCOPY/  REMOVAL IUD  12-06-2003   DE QUERVAIN'S RELEASE Left ?09/2005   DIAGNOSTIC LAPAROSCOPY     DILATATION AND CURETTAGE WITH SUCTION  05-29-2005   RETAINED PLACENTA   DILATION AND CURETTAGE OF UTERUS     DX LAPAROSCOPY/  PELVIC BX'S/  LYSIS ADHESIONS  06-26-2000   FOOT HARDWARE REMOVAL Left 03/2010   REMOVAL HARDWARE "related to earlier hammertoe OR"   HYDRADENITIS EXCISION  06/29/2011   Procedure: EXCISION HYDRADENITIS AXILLA;  Surgeon: Rosalio Macadamia, MD;  Location: [facility];  Service: Plastics;;  right breast hydradenitis excioion   HYDRADENITIS EXCISION Left 2000   INCISION AND DRAINAGE ABSCESS ANAL  02-14-2001   KNEE ARTHROSCOPY WITH LATERAL MENISECTOMY Right 09/08/2013   Procedure: RIGHT KNEE ARTHROSCOPY PARTIAL LATERAL MENISCECTOMY AND DEBRIDEMENT ;  Surgeon: Javier Docker, MD;  Location: [facility];  Service: Orthopedics;  Laterality: Right;   LAPAROSCOPY ABDOMEN DIAGNOSTIC  SEPT  2003   [place]   AND PLACEMENT IUD   MASS EXCISION  10/29/2003   WIDE EXCISION CHEST AREA   PILONIDAL CYST EXCISION  1983   RADIOLOGY WITH ANESTHESIA N/A 02/07/2019   Procedure: MRI BRAIN WITHOUT IV CONTRAST; MRI CERVICAL, THORACIC, AND LUMBAR SPINE WITH AND WITHOUT CONTRAST;  Surgeon: Radiologist, Medication, MD;  Location: [facility];  Service: Radiology;  Laterality: N/A;   RADIOLOGY WITH ANESTHESIA N/A 02/15/2020   Procedure: MRI LUMBAR WITHOUT CONTRAST;  Surgeon: Radiologist, Medication, MD;  Location: [facility];  Service: Radiology;  Laterality: N/A;   REDUCTION MAMMAPLASTY  1997   TEE WITHOUT CARDIOVERSION N/A 09/26/2015   Procedure: TRANSESOPHAGEAL ECHOCARDIOGRAM (TEE);  Surgeon: Yates Decamp, MD;  Location: [facility];  Service:  Cardiovascular;   Laterality: N/A;   TEMPORAL ARTERY BIOPSY / LIGATION Left 2010   TEMPORAL ARTERY BIOPSY / LIGATION Right 06-16-2002   TONSILLECTOMY  1990   TOTAL THYROIDECTOMY  11/06/2008    Review of systems negative except as noted in HPI / PMHx or noted below:  Review of Systems  Constitutional: Negative.   HENT: Negative.    Eyes: Negative.   Respiratory: Negative.    Cardiovascular: Negative.   Gastrointestinal: Negative.   Genitourinary: Negative.   Musculoskeletal: Negative.   Skin: Negative.   Neurological: Negative.   Endo/Heme/Allergies: Negative.   Psychiatric/Behavioral: Negative.       Objective:   Vitals:   02/03/23 1400 02/03/23 1537  BP: (!) 156/92 (!) 160/92  Pulse: (!) 102   Resp: 16   SpO2: 96%           Physical Exam Constitutional:      Appearance: She is not diaphoretic.  HENT:     Head: Normocephalic.     Right Ear: Tympanic membrane, ear canal and external ear normal.     Left Ear: Tympanic membrane, ear canal and external ear normal.     Nose: Nose normal. No mucosal edema or rhinorrhea.     Mouth/Throat:     Pharynx: Uvula midline. No oropharyngeal exudate.  Eyes:     Conjunctiva/sclera: Conjunctivae normal.  Neck:     Thyroid: No thyromegaly.     Trachea: Trachea normal. No tracheal tenderness or tracheal deviation.  Cardiovascular:     Rate and Rhythm: Normal rate and regular rhythm.     Heart sounds: Normal heart sounds, S1 normal and S2 normal. No murmur heard. Pulmonary:     Effort: No respiratory distress.     Breath sounds: Normal breath sounds. No stridor. No wheezing or rales.  Lymphadenopathy:     Head:     Right side of head: No tonsillar adenopathy.     Left side of head: No tonsillar adenopathy.     Cervical: No cervical adenopathy.  Skin:    Findings: No erythema or rash.     Nails: There is no clubbing.  Neurological:     Mental Status: She is alert.     Diagnostics: Spirometry was performed and demonstrated an FEV1 of  1.83 at 73 % of predicted.  Assessment and Plan:   1. Severe persistent asthma without complication   2. Perennial allergic rhinitis   3. Seasonal allergic rhinitis due to pollen   4. Pollen-food allergy, initial encounter   5. LPRD (laryngopharyngeal reflux disease)   6. 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 Allergen avoidance measures - tree, grass, weeds, Alternaria, cockroach, specific foods  2. Treat and prevent inflammation of airway:   A. Fluticasone 110 HFA - 2 inhalations 2 times per day w/spacer (empty lungs)  B. Generic nasonex - 1 spray each nostril 1 time per day   C. Tezepelumab injections starting today and every 4 weeks  3. Treat and prevent reflux induced airway irritation:   A. Minimize caffeine and chocolate consumption  B. Replace throat clearing with swallowing / drinking maneuver  C. Omeprazole 40 mg + bicarbonate 650 - 2 times per day  4. If needed:   A. Albuterol HFA - 2 inhalations every 4-6 hours  B. Cetirizine 10 mg - 1 tablet 1 time per day  C. Pataday - 1 drop each eye 1 time per day  D. Epi-Pen, benadryl, MD/ER evaluation for allergic reactions  5. "  Action plan" for flare up:   A. Use Fluticasone 110 - 2 inhalations with every albuterol use.   6. Plan for fall flu vaccine  7. Return to clinic in 12 weeks or earlier if needed. Taper medications???  We will be starting Vikki Ports on tezepelumab today and every 4 weeks and I suspect that when she returns to this clinic in 12 weeks which will be a total of 3 injections we will be able to consolidate her medical therapy directed against respiratory tract inflammation.  She will need to remain on treatment for reflux.  Will see her back in this clinic in 12 weeks.  Laurette Schimke, MD Allergy / Immunology [facility]

## 2023-02-04 ENCOUNTER — Encounter: Payer: Self-pay | Admitting: Allergy and Immunology

## 2023-02-25 ENCOUNTER — Other Ambulatory Visit: Payer: Self-pay

## 2023-02-26 ENCOUNTER — Other Ambulatory Visit: Payer: Self-pay

## 2023-02-26 NOTE — Progress Notes (Signed)
Specialty Pharmacy Refill Coordination Note  Alexis [name] is a 57 y.o. female contacted today regarding refills of specialty medication(s) [facility]   Patient requested Delivery   Delivery date: 03/02/23   Verified address: [address], [place], [address]   Medication will be filled on 03/01/23.

## 2023-03-03 ENCOUNTER — Ambulatory Visit: Payer: Medicaid Other

## 2023-03-23 ENCOUNTER — Other Ambulatory Visit: Payer: Self-pay

## 2023-03-26 ENCOUNTER — Other Ambulatory Visit: Payer: Self-pay

## 2023-03-26 NOTE — Progress Notes (Signed)
Specialty Pharmacy Refill Coordination Note  Alexis [name] is a 57 y.o. female contacted today regarding refills of specialty medication(s) [facility]   Patient requested Delivery   Delivery date: 04/01/23   Verified address: [address] [place], [place] [address]   Medication will be filled on 03/31/23 for 04/05/23 injection.

## 2023-03-31 ENCOUNTER — Other Ambulatory Visit: Payer: Self-pay

## 2023-04-22 ENCOUNTER — Other Ambulatory Visit: Payer: Self-pay

## 2023-04-26 ENCOUNTER — Other Ambulatory Visit (HOSPITAL_COMMUNITY): Payer: Self-pay

## 2023-04-26 ENCOUNTER — Encounter (HOSPITAL_COMMUNITY): Payer: Self-pay

## 2023-04-27 ENCOUNTER — Other Ambulatory Visit: Payer: Self-pay

## 2023-04-28 ENCOUNTER — Other Ambulatory Visit (HOSPITAL_COMMUNITY): Payer: Self-pay

## 2023-04-28 ENCOUNTER — Other Ambulatory Visit (HOSPITAL_COMMUNITY): Payer: Self-pay | Admitting: Pharmacy Technician

## 2023-04-28 NOTE — Progress Notes (Signed)
Specialty Pharmacy Refill Coordination Note  MANALI BODOR is a 57 y.o. female contacted today regarding refills of specialty medication(s) Tezepelumab-Ekko   Patient requested Delivery   Delivery date: 04/30/23   Verified address: [address] [place]   Medication will be filled on 04/29/23.

## 2023-04-29 ENCOUNTER — Other Ambulatory Visit: Payer: Self-pay

## 2023-05-18 ENCOUNTER — Other Ambulatory Visit (HOSPITAL_COMMUNITY): Payer: Self-pay

## 2023-05-18 ENCOUNTER — Other Ambulatory Visit (HOSPITAL_COMMUNITY): Payer: Self-pay | Admitting: Pharmacy Technician

## 2023-05-18 NOTE — Progress Notes (Signed)
 Specialty Pharmacy Refill Coordination Note  Alexis [name] is a 57 y.o. female contacted today regarding refills of specialty medication(s) Tezepelumab -ekko (TEZSPIRE )   Patient requested Delivery   Delivery date: 06/01/23   Verified address: [address] [place]   Medication will be filled on 05/31/23.

## 2023-05-18 NOTE — Progress Notes (Signed)
 Specialty Pharmacy Ongoing Clinical Assessment Note  Alexis [name] is a 57 y.o. female who is being followed by the specialty pharmacy service for RxSp Asthma/COPD   Patient's specialty medication(s) reviewed today: Tezepelumab -ekko (TEZSPIRE )   Missed doses in the last 4 weeks: 0   Patient/Caregiver did not have any additional questions or concerns.   Therapeutic benefit summary: Patient is achieving benefit   Adverse events/side effects summary: No adverse events/side effects   Patient's therapy is appropriate to: Continue    Goals Addressed             This Visit's Progress    Minimize recurrence of flares       Patient is on track. Patient will maintain adherence.  Patient reports that she remains reasonably well-controlled.          Follow up:  6 months  Silvano LOISE Dolly Specialty Pharmacist

## 2023-05-31 ENCOUNTER — Other Ambulatory Visit: Payer: Self-pay

## 2023-06-21 ENCOUNTER — Other Ambulatory Visit: Payer: Self-pay

## 2023-06-21 NOTE — Progress Notes (Signed)
Specialty Pharmacy Refill Coordination Note  Alexis [name] is a 58 y.o. female contacted today regarding refills of specialty medication(s) [name]   Patient requested Delivery   Delivery date: 06/30/23   Verified address: [address] [place]   Medication will be filled on 06/29/23.

## 2023-06-29 ENCOUNTER — Other Ambulatory Visit (HOSPITAL_COMMUNITY): Payer: Self-pay

## 2023-06-29 ENCOUNTER — Encounter (HOSPITAL_COMMUNITY): Payer: Self-pay

## 2023-06-29 ENCOUNTER — Telehealth: Payer: Self-pay

## 2023-06-29 NOTE — Telephone Encounter (Signed)
Per call center PA is needed for Tezspire. Thank you

## 2023-07-05 ENCOUNTER — Other Ambulatory Visit: Payer: Self-pay

## 2023-07-05 ENCOUNTER — Ambulatory Visit: Payer: Medicaid Other | Admitting: Family Medicine

## 2023-07-06 ENCOUNTER — Other Ambulatory Visit: Payer: Self-pay

## 2023-07-06 ENCOUNTER — Ambulatory Visit: Payer: Medicaid Other | Admitting: Family

## 2023-07-06 NOTE — Patient Instructions (Incomplete)
  1. Allergen avoidance measures - tree, grass, weeds, Alternaria, cockroach, specific foods  2. Treat and prevent inflammation of airway:   A. Fluticasone 110 HFA - 2 inhalations 2 times per day w/spacer (empty lungs)  B. Generic nasonex - 1 spray each nostril 1 time per day   C. Tezepelumab injections starting  every 4 weeks  3. Treat and prevent reflux induced airway irritation:   A. Minimize caffeine and chocolate consumption  B. Replace throat clearing with swallowing / drinking maneuver  C. Omeprazole 40 mg + bicarbonate 650 - 2 times per day  4. If needed:   A. Albuterol HFA - 2 inhalations every 4-6 hours  B. Cetirizine 10 mg - 1 tablet 1 time per day  C. Pataday - 1 drop each eye 1 time per day  D. Epi-Pen, benadryl, MD/ER evaluation for allergic reactions  5. "Action plan" for flare up:   A. Use Fluticasone 110 - 2 inhalations with every albuterol use.   6. Plan for fall flu vaccine  7. Return to clinic in months or sooner if needed

## 2023-07-07 ENCOUNTER — Other Ambulatory Visit (HOSPITAL_COMMUNITY): Payer: Self-pay

## 2023-07-09 ENCOUNTER — Other Ambulatory Visit (HOSPITAL_COMMUNITY): Payer: Self-pay

## 2023-07-12 ENCOUNTER — Other Ambulatory Visit: Payer: Self-pay

## 2023-07-13 ENCOUNTER — Ambulatory Visit: Payer: Medicaid Other | Admitting: Family

## 2023-07-15 ENCOUNTER — Other Ambulatory Visit (HOSPITAL_COMMUNITY): Payer: Self-pay

## 2023-07-15 ENCOUNTER — Other Ambulatory Visit: Payer: Self-pay

## 2023-07-22 ENCOUNTER — Other Ambulatory Visit: Payer: Self-pay

## 2023-07-26 ENCOUNTER — Other Ambulatory Visit (HOSPITAL_COMMUNITY): Payer: Self-pay

## 2023-07-28 ENCOUNTER — Other Ambulatory Visit (HOSPITAL_COMMUNITY): Payer: Self-pay

## 2023-08-17 ENCOUNTER — Other Ambulatory Visit: Payer: Self-pay | Admitting: Allergy and Immunology

## 2023-11-03 ENCOUNTER — Other Ambulatory Visit: Payer: Self-pay | Admitting: Allergy and Immunology

## 2023-11-04 ENCOUNTER — Other Ambulatory Visit: Payer: Self-pay

## 2023-11-13 ENCOUNTER — Other Ambulatory Visit: Payer: Self-pay | Admitting: Allergy and Immunology

## 2023-11-25 ENCOUNTER — Other Ambulatory Visit: Payer: Self-pay

## 2023-11-25 ENCOUNTER — Emergency Department (HOSPITAL_COMMUNITY): Admission: EM | Admit: 2023-11-25 | Discharge: 2023-11-25 | Disposition: A | Discharge status: Home / Self Care

## 2023-11-25 ENCOUNTER — Encounter (HOSPITAL_COMMUNITY): Payer: Self-pay

## 2023-11-25 ENCOUNTER — Emergency Department (HOSPITAL_COMMUNITY)

## 2023-11-25 DIAGNOSIS — I10 Essential (primary) hypertension: Secondary | ICD-10-CM | POA: Insufficient documentation

## 2023-11-25 DIAGNOSIS — D72829 Elevated white blood cell count, unspecified: Secondary | ICD-10-CM | POA: Diagnosis not present

## 2023-11-25 DIAGNOSIS — Z91040 Latex allergy status: Secondary | ICD-10-CM | POA: Diagnosis not present

## 2023-11-25 DIAGNOSIS — K625 Hemorrhage of anus and rectum: Secondary | ICD-10-CM | POA: Insufficient documentation

## 2023-11-25 DIAGNOSIS — Z79899 Other long term (current) drug therapy: Secondary | ICD-10-CM | POA: Diagnosis not present

## 2023-11-25 DIAGNOSIS — Z7982 Long term (current) use of aspirin: Secondary | ICD-10-CM | POA: Insufficient documentation

## 2023-11-25 DIAGNOSIS — E119 Type 2 diabetes mellitus without complications: Secondary | ICD-10-CM | POA: Diagnosis not present

## 2023-11-25 DIAGNOSIS — E039 Hypothyroidism, unspecified: Secondary | ICD-10-CM | POA: Insufficient documentation

## 2023-11-25 DIAGNOSIS — R1084 Generalized abdominal pain: Secondary | ICD-10-CM

## 2023-11-25 LAB — COMPREHENSIVE METABOLIC PANEL WITH GFR
ALT: 29 U/L (ref 0–44)
AST: 35 U/L (ref 15–41)
Albumin: 3.7 g/dL (ref 3.5–5.0)
Alkaline Phosphatase: 102 U/L (ref 38–126)
Anion gap: 10 (ref 5–15)
BUN: 8 mg/dL (ref 6–20)
CO2: 28 mmol/L (ref 22–32)
Calcium: 9.1 mg/dL (ref 8.9–10.3)
Chloride: 101 mmol/L (ref 98–111)
Creatinine, Ser: 0.83 mg/dL (ref 0.44–1.00)
GFR, Estimated: >60 mL/min (ref >60)
Glucose, Bld: 162 mg/dL — ABNORMAL HIGH (ref 70–99)
Potassium: 4.1 mmol/L (ref 3.5–5.1)
Sodium: 139 mmol/L (ref 135–145)
Total Bilirubin: 0.6 mg/dL (ref 0.0–1.2)
Total Protein: 7.9 g/dL (ref 6.5–8.1)

## 2023-11-25 LAB — CBC
HCT: 37.6 % (ref 36.0–46.0)
Hemoglobin: 12.2 g/dL (ref 12.0–15.0)
MCH: 28.8 pg (ref 26.0–34.0)
MCHC: 32.4 g/dL (ref 30.0–36.0)
MCV: 88.7 fL (ref 80.0–100.0)
Platelets: 353 K/uL (ref 150–400)
RBC: 4.24 MIL/uL (ref 3.87–5.11)
RDW: 14.2 % (ref 11.5–15.5)
WBC: 11.6 K/uL — ABNORMAL HIGH (ref 4.0–10.5)
nRBC: 0 % (ref 0.0–0.2)

## 2023-11-25 LAB — TYPE AND SCREEN
ABO/RH(D): A POS
Antibody Screen: NEGATIVE

## 2023-11-25 LAB — POC OCCULT BLOOD, ED: Fecal Occult Bld: POSITIVE — AB

## 2023-11-25 MED ORDER — DICYCLOMINE HCL 10 MG PO CAPS
20.0000 mg | ORAL_CAPSULE | Freq: Once | ORAL | Status: AC
  Administered 2023-11-25: 20 mg via ORAL
  Filled 2023-11-25: qty 2

## 2023-11-25 MED ORDER — ONDANSETRON HCL 4 MG/2ML IJ SOLN
4.0000 mg | Freq: Once | INTRAMUSCULAR | Status: AC
  Administered 2023-11-25: 4 mg via INTRAVENOUS
  Filled 2023-11-25: qty 2

## 2023-11-25 MED ORDER — MORPHINE SULFATE (PF) 4 MG/ML IV SOLN
4.0000 mg | Freq: Once | INTRAVENOUS | Status: AC
  Administered 2023-11-25: 4 mg via INTRAVENOUS
  Filled 2023-11-25: qty 1

## 2023-11-25 MED ORDER — SODIUM CHLORIDE 0.9 % IV BOLUS
500.0000 mL | Freq: Once | INTRAVENOUS | Status: AC
  Administered 2023-11-25: 500 mL via INTRAVENOUS

## 2023-11-25 MED ORDER — IOHEXOL 350 MG/ML SOLN
75.0000 mL | Freq: Once | INTRAVENOUS | Status: AC | PRN
  Administered 2023-11-25: 75 mL via INTRAVENOUS

## 2023-11-25 MED ORDER — DICYCLOMINE HCL 20 MG PO TABS: 20.0000 mg | ORAL_TABLET | Freq: Three times a day (TID) | ORAL | 0 refills | Status: AC

## 2023-11-25 NOTE — ED Notes (Signed)
 PT still c/o slight abdominal pain/cramps but states she can go home.PT was advised to come back if symptoms get worse.

## 2023-11-25 NOTE — ED Notes (Signed)
 PT given second dose of morphine  in 1 hr and some bentanyl. PT is sitting for a min to let initial medication feeling wear off.

## 2023-11-25 NOTE — Discharge Instructions (Signed)
 Your CT scan and blood work today were overall reassuring, rectal bleeding could be due to diverticulosis versus internal hemorrhoids.  Monitor closely and if you have repeat episodes or larger volumes of bleeding return for reevaluation.  Use prescribed dicyclomine  before meals and at bedtime to help with cramping abdominal pain.  Stick to clear liquids for the next day and then eat a bland diet over the next several days to help with symptoms.  Call to set up follow-up appointment with the GI office on your paperwork today and use the phone number at the end of this paperwork to help establish care with a primary care provider.

## 2023-11-25 NOTE — ED Triage Notes (Signed)
 Pt arrives via POV. Pt reports heavy rectal bleeding since this morning and abdominal pain since this morning. Denies blood thinners. PT reports associated lightheadedness. Pt is AxOx4.

## 2023-11-25 NOTE — ED Provider Notes (Signed)
 [place] EMERGENCY DEPARTMENT AT [facility] Provider Note   CSN: [id number] Arrival date & time: 11/25/23  1222     Patient presents with: Rectal Bleeding   Alexis [name] is a 58 y.o. female.   Alexis [name] is a 58 y.o. female with a history of hypertension, GERD, fibromyalgia, hypothyroidism, chronic colitis, IBS, diabetes who presents to the emergency department for evaluation of rectal bleeding and abdominal pain.  Patient reports she woke up this morning to go to the bathroom and noted she felt a bit off, went to pass gas and noted bright red blood in the toilet bowl and when she wiped.  She did not note any hematuria or vaginal bleeding and has been postmenopausal with no cycles for several years.  She reports intermittent cramping generalized abdominal pain worse in the lower abdomen.  Feels somewhat similar to prior bouts of colitis that she has had.  Does has long history of constipation and is on linzess, has internal hemorrhoids. She is unsure about her history of colitis but says that she recalls being on dicyclomine  for this previously.  Had colonoscopy last year ago that looked good at [facility].  Does not have local follow-up with GI. No blood thinners.   The history is provided by the patient and medical records.  Rectal Bleeding Associated symptoms: abdominal pain   Associated symptoms: no fever        Prior to Admission medications   Medication Sig Start Date End Date Taking? Authorizing Provider  dicyclomine  (BENTYL ) 20 MG tablet Take 1 tablet (20 mg total) by mouth 4 (four) times daily -  before meals and at bedtime. 11/25/23  Yes Brent Noto F, PA-C  acetaminophen  (TYLENOL ) 500 MG tablet Take 500 mg by mouth every 6 (six) hours as needed for headache.    [provider]  ASPIRIN  81 PO Take by mouth daily.    [provider]  atorvastatin  (LIPITOR) 40 MG tablet Take 1 tablet (40 mg total) by mouth daily. Patient not taking: Reported  on 02/03/2023 01/31/22   Singh, Prashant K, MD  carvedilol  (COREG ) 6.25 MG tablet Take 1 tablet (6.25 mg total) by mouth 2 (two) times daily with a meal. 01/31/22   Dennise Lavada POUR, MD  cetirizine  (ZYRTEC ) 10 MG tablet TAKE 1 TABLET BY MOUTH ONCE DAILY AS NEEDED 08/17/23   Kozlow, Camellia PARAS, MD  Diclofenac  Sodium 3 % GEL Apply 1 Application topically daily. Apply to the right knee 3 times a day. 01/31/22   Singh, Prashant K, MD  EPINEPHrine  0.3 mg/0.3 mL IJ SOAJ injection Use as directed for life-threatening allergic reaction. 10/08/22   Kozlow, Camellia PARAS, MD  FARXIGA 10 MG TABS tablet Take 10 mg by mouth daily. Patient not taking: Reported on 02/03/2023    [provider]  fluticasone  (FLOVENT  HFA) 110 MCG/ACT inhaler Inhale two puffs with spacer one to two times per day.  Rinse, gargle, and spit after use. 10/08/22   Kozlow, Eric J, MD  Ibuprofen (ADVIL PO) Take by mouth daily as needed.    [provider]  latanoprost (XALATAN) 0.005 % ophthalmic solution SMARTSIG:In Eye(s) 11/04/22   [provider]  lidocaine  (XYLOCAINE ) 5 % ointment Apply topically.    [provider]  LYRICA  75 MG capsule Take 75 mg by mouth at bedtime. 07/24/16   [provider]  MAGNESIUM PO Take 250 mg by mouth as needed.    [provider]  Melatonin 12 MG TABS  Take 12 mg by mouth at bedtime.    [provider]  mometasone  (NASONEX ) 50 MCG/ACT nasal spray Use one spray in each nostril once daily. 11/16/22   Kozlow, Eric J, MD  Naproxen Sodium (ALEVE PO) Take by mouth as needed.    [provider]  Olopatadine  HCl 0.2 % SOLN Can use one drop in each eye once daily if needed. 10/08/22   Kozlow, Camellia PARAS, MD  omeprazole  (PRILOSEC) 40 MG capsule Take 1 capsule (40 mg total) by mouth 2 (two) times daily. 02/03/23   Kozlow, Camellia PARAS, MD  ondansetron  (ZOFRAN -ODT) 8 MG disintegrating tablet Take 8 mg by mouth every 8 (eight) hours as needed for nausea or vomiting.    [provider]  oxybutynin  (DITROPAN  XL) 15 MG 24 hr tablet Take 1 tablet (15 mg total) by mouth daily. 01/31/22   Singh, Prashant K, MD  Oxycodone  HCl 20 MG TABS Take 20 mg by mouth every 4 (four) hours as needed for pain.    [provider]  promethazine  (PHENERGAN ) 25 MG suppository Place 1 suppository (25 mg total) rectally every 6 (six) hours as needed for nausea or vomiting. Patient not taking: Reported on 02/03/2023 03/14/22   Smoot, Lauraine LABOR, PA-C  promethazine  (PHENERGAN ) 25 MG tablet Take 25 mg by mouth every 6 (six) hours as needed for nausea or vomiting.    [provider]  sodium bicarbonate  650 MG tablet TAKE 1 TABLET(650 MG) BY MOUTH TWICE DAILY 11/03/23   Kozlow, Eric J, MD  Spacer/Aero-Holding Raguel FRENCH Use with inhaler as directed. 10/08/22   Kozlow, Camellia PARAS, MD  Tezepelumab -ekko (TEZSPIRE ) 210 MG/1. SOAJ Inject 210 mg into the skin every 28 (twenty-eight) days. 12/08/22   Kozlow, Camellia PARAS, MD  thyroid  (ARMOUR) 120 MG tablet Take 120 mg by mouth daily before breakfast.    [provider]  tiZANidine  (ZANAFLEX ) 4 MG tablet Take by mouth.    [provider]  VENTOLIN  HFA 108 (90 Base) MCG/ACT inhaler Can inhale two puffs every four to six hours as needed for cough or wheeze. 02/03/23   Kozlow, Camellia PARAS, MD  Vitamin D , Ergocalciferol , (DRISDOL ) 1.25 MG (50000 UNIT) CAPS capsule Take 50,000 Units by mouth every Tuesday. 01/24/20   [provider]  lisinopril  (ZESTRIL ) 20 MG tablet Take 1 tablet (20 mg total) by mouth daily. 02/10/20 02/10/20  Suzette Pac, MD    Allergies: Food, Other, Adhesive [tape], Armour thyroid  [thyroid ], Bactrim, Clarithromycin, Ibuprofen, Meclizine , Metformin hcl, Nexium [esomeprazole], Nsaids, Peg [id number]-nabcb-nacl-nasulf, Simbrinza [brinzolamide -brimonidine], Sulfa antibiotics, Tisagenlecleucel, Tolmetin, Topiramate , Trimethoprim, and Latex    Review of Systems  Constitutional:  Negative for chills and fever.   Gastrointestinal:  Positive for abdominal pain, anal bleeding, blood in stool and hematochezia.  All other systems reviewed and are negative.   Updated Vital Signs BP (!) 155/90   Pulse (!) 108   Temp 98.2 F (36.8 C)   Resp 18   LMP 02/04/2018   SpO2 98%   Physical Exam Vitals and nursing note reviewed.  Constitutional:      General: She is not in acute distress.    Appearance: Normal appearance. She is well-developed. She is not diaphoretic.  HENT:     Head: Normocephalic and atraumatic.  Eyes:     General:        Right eye: No discharge.        Left eye: No discharge.     Pupils: Pupils are equal, round, and reactive to  light.  Cardiovascular:     Rate and Rhythm: Normal rate and regular rhythm.     Pulses: Normal pulses.     Heart sounds: Normal heart sounds.  Pulmonary:     Effort: Pulmonary effort is normal. No respiratory distress.     Breath sounds: Normal breath sounds. No wheezing or rales.     Comments: Respirations equal and unlabored, patient able to speak in full sentences, lungs clear to auscultation bilaterally  Abdominal:     General: Bowel sounds are normal. There is no distension.     Palpations: Abdomen is soft. There is no mass.     Tenderness: There is abdominal tenderness. There is no guarding.     Comments: Abdomen soft, nondistended, bowel sounds present throughout, mild generalized abdominal tenderness without guarding or rebound  Genitourinary:    Comments: Chaperone present during rectal exam.  No external hemorrhoids, fissures or tears noted, soft brown stool present in the rectal vault with no bright red blood or melena noted, normal rectal tone Musculoskeletal:        General: No deformity.     Cervical back: Neck supple.  Skin:    General: Skin is warm and dry.     Capillary Refill: Capillary refill takes less than 2 seconds.  Neurological:     Mental Status: She is alert and oriented to person, place, and time.     Coordination:  Coordination normal.     Comments: Speech is clear, able to follow commands CN III-XII intact Normal strength in upper and lower extremities bilaterally including dorsiflexion and plantar flexion, strong and equal grip strength Sensation normal to light and sharp touch Moves extremities without ataxia, coordination intact  Psychiatric:        Mood and Affect: Mood normal.        Behavior: Behavior normal.     (all labs ordered are listed, but only abnormal results are displayed) Labs Reviewed  COMPREHENSIVE METABOLIC PANEL WITH GFR - Abnormal; Notable for the following components:      Result Value   Glucose, Bld 162 (*)    All other components within normal limits  CBC - Abnormal; Notable for the following components:   WBC 11.6 (*)    All other components within normal limits  POC OCCULT BLOOD, ED - Abnormal; Notable for the following components:   Fecal Occult Bld POSITIVE (*)    All other components within normal limits  TYPE AND SCREEN    EKG: None  Radiology: CT ABDOMEN PELVIS W CONTRAST Result Date: 11/25/2023 CLINICAL DATA:  Abdominal pain and rectal bleeding EXAM: CT ABDOMEN AND PELVIS WITH CONTRAST TECHNIQUE: Multidetector CT imaging of the abdomen and pelvis was performed using the standard protocol following bolus administration of intravenous contrast. RADIATION DOSE REDUCTION: This exam was performed according to the departmental dose-optimization program which includes automated exposure control, adjustment of the mA and/or kV according to patient size and/or use of iterative reconstruction technique. CONTRAST:  75mL OMNIPAQUE  IOHEXOL  350 MG/ML SOLN COMPARISON:  CT abdomen and pelvis dated 03/12/2022 FINDINGS: Lower chest: No focal consolidation or pulmonary nodule in the lung bases. No pleural effusion or pneumothorax demonstrated. Partially imaged heart size is normal. Hepatobiliary: No focal hepatic lesions. No intra or extrahepatic biliary ductal dilation. Normal  gallbladder. Pancreas: No focal lesions or main ductal dilation. Spleen: Normal in size without focal abnormality. Adrenals/Urinary Tract: No adrenal nodules. No suspicious renal mass, calculi or hydronephrosis. Subcentimeter left renal hypodensities, too small to characterize but  likely cysts. No focal bladder wall thickening. Stomach/Bowel: Normal appearance of the stomach. No evidence of bowel wall thickening, distention, or inflammatory changes. Moderate volume stool within the ascending and transverse colon. Normal appendix. Vascular/Lymphatic: Aortic atherosclerosis. 10 mm left external iliac lymph node (3:75). Reproductive: No adnexal masses. Other: No free fluid, fluid collection, or free air. Musculoskeletal: No acute or abnormal lytic or blastic osseous lesions. Multilevel degenerative changes of the partially imaged thoracic and lumbar spine. Unchanged grade 1 anterolisthesis at L4-5 and grade 1 retrolisthesis at L5-S1. Left sacral nerve stimulator generator with overlying the left posterior lumbar region. 9 mm subtle nodular focus along the superficial left medial gluteal cleft (3:92) may represent an inclusion cyst. IMPRESSION: 1. No acute abdominopelvic findings. 2. Mildly enlarged left external iliac lymph node, nonspecific. 3.  Aortic Atherosclerosis (ICD10-I70.0). Electronically Signed   By: Limin  Xu M.D.   On: 11/25/2023 14:56     Procedures   Medications Ordered in the ED  morphine  (PF) 4 MG/ML injection 4 mg (has no administration in time range)  dicyclomine  (BENTYL ) capsule 20 mg (has no administration in time range)  ondansetron  (ZOFRAN ) injection 4 mg (4 mg Intravenous Given 11/25/23 1357)  sodium chloride  0.9 % bolus 500 mL (500 mLs Intravenous New Bag/Given 11/25/23 1406)  morphine  (PF) 4 MG/ML injection 4 mg (4 mg Intravenous Given 11/25/23 1358)  iohexol  (OMNIPAQUE ) 350 MG/ML injection 75 mL (75 mLs Intravenous Contrast Given 11/25/23 1445)                                     Medical Decision Making Amount and/or Complexity of Data Reviewed Labs: ordered. Radiology: ordered.  Risk Prescription drug management.   58 year old female presents with bright red blood per rectum and abdominal pain, started this morning had 1 episode of bleeding but no further bleeding since, not on blood thinners.  Given abdominal tenderness will evaluate with labs and CT abdomen pelvis  IV fluids, pain and nausea medication provided.  Differential includes internal hemorrhoids, diverticular bleed, diverticulitis, colitis  Despite reported bleeding patient's hemoglobin is within normal limits, very minimal leukocytosis of 11.6, no significant electrolyte derangements, point-of-care Hemoccult is positive but there was no visible bleeding or melena present CT abdomen pelvis without evidence of colitis, diverticulitis or other acute abnormality  Given that it has been 5 hours since patient's initial episode of bleeding with no repeat episodes, normal hemoglobin and reassuring CT scan will discharge patient home with strict return precautions.  Bentyl  prescribed to help with abdominal pain and cramping.  Given local GI follow-up information.  Discharged home in good condition.     Final diagnoses:  Rectal bleeding  Generalized abdominal pain    ED Discharge Orders          Ordered    dicyclomine  (BENTYL ) 20 MG tablet  3 times daily before meals & bedtime        11/25/23 1514               Alva Larraine FALCON, PA-C 12/02/23 0056    Ula Prentice SAUNDERS, MD 12/06/23 [phone]

## 2023-12-21 ENCOUNTER — Other Ambulatory Visit: Payer: Self-pay | Admitting: Pharmacy Technician

## 2023-12-21 ENCOUNTER — Other Ambulatory Visit: Payer: Self-pay

## 2023-12-21 NOTE — Progress Notes (Signed)
 Disenrolled; Last filled 1/13/205. Note from 07/22/23- Patient needs Appt to renew PA. Appt 2/17, 2/18, 2/25 Cancel & no Show.

## 2024-04-03 ENCOUNTER — Ambulatory Visit: Admitting: Allergy and Immunology

## 2024-04-06 ENCOUNTER — Ambulatory Visit: Admitting: Allergy and Immunology

## 2024-04-06 ENCOUNTER — Encounter: Payer: Self-pay | Admitting: Allergy and Immunology

## 2024-04-06 VITALS — BP 130/88 | HR 96 | Resp 18 | Ht 63.0 in | Wt 230.2 lb

## 2024-04-06 DIAGNOSIS — J301 Allergic rhinitis due to pollen: Secondary | ICD-10-CM | POA: Diagnosis not present

## 2024-04-06 DIAGNOSIS — T7819XD Other adverse food reactions, not elsewhere classified, subsequent encounter: Secondary | ICD-10-CM | POA: Diagnosis not present

## 2024-04-06 DIAGNOSIS — J4550 Severe persistent asthma, uncomplicated: Secondary | ICD-10-CM | POA: Diagnosis not present

## 2024-04-06 DIAGNOSIS — T7819XA Other adverse food reactions, not elsewhere classified, initial encounter: Secondary | ICD-10-CM

## 2024-04-06 DIAGNOSIS — J3089 Other allergic rhinitis: Secondary | ICD-10-CM

## 2024-04-06 DIAGNOSIS — K219 Gastro-esophageal reflux disease without esophagitis: Secondary | ICD-10-CM

## 2024-04-06 MED ORDER — CETIRIZINE HCL 10 MG PO TABS
ORAL_TABLET | ORAL | 1 refills | Status: AC
Start: 2024-04-06 — End: ?

## 2024-04-06 MED ORDER — OLOPATADINE HCL 0.2 % OP SOLN: OPHTHALMIC | 1 refills | Status: AC

## 2024-04-06 MED ORDER — OMEPRAZOLE 40 MG PO CPDR: 40.0000 mg | DELAYED_RELEASE_CAPSULE | Freq: Two times a day (BID) | ORAL | 1 refills | Status: AC

## 2024-04-06 MED ORDER — SODIUM BICARBONATE 650 MG PO TABS: ORAL_TABLET | ORAL | 1 refills | Status: AC

## 2024-04-06 MED ORDER — VENTOLIN HFA 108 (90 BASE) MCG/ACT IN AERS: INHALATION_SPRAY | RESPIRATORY_TRACT | 1 refills | Status: AC

## 2024-04-06 MED ORDER — MOMETASONE FUROATE 50 MCG/ACT NA SUSP: NASAL | 1 refills | Status: AC

## 2024-04-06 MED ORDER — FLUTICASONE PROPIONATE HFA 110 MCG/ACT IN AERO: INHALATION_SPRAY | RESPIRATORY_TRACT | 1 refills | Status: AC

## 2024-04-06 MED ORDER — EPINEPHRINE 0.3 MG/0.3ML IJ SOAJ: INTRAMUSCULAR | 3 refills | Status: AC

## 2024-04-06 NOTE — Progress Notes (Signed)
 [place] - [place] - [place]   Follow-up Note  Referring Provider: Jerome Heron Jama SHAUNNA* Primary Provider: Jerome Heron Jama DEVONNA Date of Office Visit: 04/06/2024  Subjective:   Alexis [name] (DOB: 24-May-1965) is a 58 y.o. female who [facility] in re-evaluation of the following:  HPI: Elois returns to this clinic in evaluation of asthma, allergic rhinoconjunctivitis, oral allergy  syndrome, reflux / LPR, vocal cord dysfunction.  I last saw her in this clinic 03 February 2023.  She unfortunately discontinued her tezepelumab  injections for some unsure reason and as expected she has developed problems with coughing and wheezing and getting out of breath and having to use her short acting bronchodilator even though she remains on anti-inflammatory agents for both her upper and lower airway.  It does not sound as though she has required a systemic steroid to treat an exacerbation of asthma or an antibiotic to treat a episode of sinusitis.  Her reflux is not under  good control at this point in time.  Even while using therapy directed against reflux she still has significant esophageal problems with heartburn and regurgitation.  She is very careful about eating foods that give rise to oral itching or throat itching.  Allergies as of 04/06/2024       Reactions   Food Nausea And Vomiting   Raw pulp in fruit- can eat cooked.   Other Nausea And Vomiting, Other (See Comments)   N/V - Enteric Coating   Adhesive [tape] Other (See Comments)   Skin irritation   Armour Thyroid  [thyroid ]    GENERIC ONLY- CAUSED HIVES;  CAN TAKE BRAND WITHOUT PROBLEM PER PATIENT   Bactrim Nausea And Vomiting, Other (See Comments)   Abdominal pain   Clarithromycin Nausea And Vomiting   Ibuprofen Nausea And Vomiting, Other (See Comments)   Severe stomach pain- can take IV with no issues   Meclizine  Other (See Comments)    Hallucinations, dizziness   Metformin Hcl Diarrhea, Nausea And Vomiting   Nexium [esomeprazole] Other (See Comments)   Cramps and headaches   Nsaids Other (See Comments)   Avoids due to GERD   Peg [id number]-nacl-nasulf Other (See Comments)   Aspiration    Simbrinza [brinzolamide -brimonidine]    Caused eye redness and pain   Sulfa Antibiotics Nausea And Vomiting, Other (See Comments)   No issue with IV    Tisagenlecleucel Other (See Comments)   Unknown reaction    Tolmetin Other (See Comments)   Avoid due to GERD   Topiramate  Other (See Comments)   Dizziness   Trimethoprim Nausea And Vomiting   Latex Itching, Rash        Medication List    acetaminophen  500 MG tablet Commonly known as: TYLENOL  Take 500 mg by mouth every 6 (six) hours as needed for headache.   ADVIL PO Take by mouth daily as needed.   ALEVE PO Take by mouth as needed.   ASPIRIN  81 PO Take by mouth daily.   atorvastatin  40 MG tablet Commonly known as: LIPITOR Take 1 tablet (40 mg total) by mouth daily.   carvedilol  6.25 MG tablet Commonly known as: COREG  Take 1 tablet (6.25 mg total) by mouth 2 (two) times daily with a meal.   cetirizine  10 MG tablet Commonly known as: ZYRTEC  TAKE 1 TABLET BY MOUTH ONCE DAILY AS NEEDED   cyclobenzaprine  10 MG tablet Commonly known as: FLEXERIL  Take 10 mg by mouth 3 (three)  times daily as needed.   Diclofenac  Sodium 3 % Gel Apply 1 Application topically daily. Apply to the right knee 3 times a day.   dicyclomine  20 MG tablet Commonly known as: BENTYL  Take 1 tablet (20 mg total) by mouth 4 (four) times daily -  before meals and at bedtime.   DULoxetine  30 MG capsule Commonly known as: CYMBALTA  Take 60 mg by mouth at bedtime.   EPINEPHrine  0.3 mg/0.3 mL Soaj injection Commonly known as: EPI-PEN Use as directed for life-threatening allergic reaction.   fluticasone  110 MCG/ACT inhaler Commonly known as: FLOVENT  HFA Inhale two puffs with spacer one  to two times per day.  Rinse, gargle, and spit after use.   latanoprost 0.005 % ophthalmic solution Commonly known as: XALATAN SMARTSIG:In Eye(s)   lidocaine  5 % ointment Commonly known as: XYLOCAINE  Apply topically.   lidocaine  5 % Commonly known as: LIDODERM  SMARTSIG:1 Patch(s) Topical As Directed   Lyrica  75 MG capsule Generic drug: pregabalin  Take 75 mg by mouth at bedtime.   MAGNESIUM PO Take 250 mg by mouth as needed.   MELATONIN PO Take 5-10 mg by mouth at bedtime.   mometasone  50 MCG/ACT nasal spray Commonly known as: NASONEX  Use one spray in each nostril once daily.   naloxone  4 MG/0.1ML Liqd nasal spray kit Commonly known as: NARCAN  SMARTSIG:Both Nares   Olopatadine  HCl 0.2 % Soln Can use one drop in each eye once daily if needed.   ondansetron  8 MG disintegrating tablet Commonly known as: ZOFRAN -ODT Take 8 mg by mouth every 8 (eight) hours as needed for nausea or vomiting.   oxybutynin  15 MG 24 hr tablet Commonly known as: DITROPAN  XL Take 1 tablet (15 mg total) by mouth daily.   Oxycodone  HCl 20 MG Tabs Take 20 mg by mouth every 4 (four) hours as needed for pain.   promethazine  25 MG tablet Commonly known as: PHENERGAN  Take 25 mg by mouth every 6 (six) hours as needed for nausea or vomiting.   promethazine  25 MG suppository Commonly known as: PHENERGAN  Place 1 suppository (25 mg total) rectally every 6 (six) hours as needed for nausea or vomiting.   sodium bicarbonate  650 MG tablet TAKE 1 TABLET(650 MG) BY MOUTH TWICE DAILY   Spacer/Aero-Holding Raguel French Use with inhaler as directed.   thyroid  120 MG tablet Commonly known as: ARMOUR Take 120 mg by mouth daily before breakfast.   Ventolin  HFA 108 (90 Base) MCG/ACT inhaler Generic drug: albuterol  Can inhale two puffs every four to six hours as needed for cough or wheeze.   Vitamin D  (Ergocalciferol ) 1.25 MG (50000 UNIT) Caps capsule Commonly known as: DRISDOL  Take 50,000 Units by  mouth every Tuesday.    Past Medical History:  Diagnosis Date   Adrenal gland cyst    left/CT (05/17/2017)   Anemia    ASD (atrial septal defect)    per pt   Asthma    Bilateral dry eyes    Cervical spondylosis    C5-6   CHF (congestive heart failure) (HCC)    not sure   Chronic back pain    Chronic bronchitis (HCC)    Chronic colitis    Chronic lower GI bleeding    Complication of anesthesia    has awakened in past during surgery/  LEFT VOCAL CORD PARALYSIS  POST TOTAL THYROIDECTOMY  03-08-2009 ([facility])   Degenerative disk disease    cervical to sacrum (05/17/2017)   Depression    Diabetes (HCC)  Diabetes mellitus without complication (HCC)    Diabetic neuropathy, type II diabetes mellitus (HCC) 12/14/2014   not on medication   DJD (degenerative joint disease) of knee    Dyspnea    Endometriosis 01/18/2001   Family history of adverse reaction to anesthesia    mom wakes up during procedures   Fatty liver    Fibromyalgia    Gastric ulcer    GERD (gastroesophageal reflux disease)    Glaucoma of both eyes    H/O hiatal hernia    Hiatal hernia    High cholesterol    History of colon polyps    History of colon polyps    Hydradenitis    Hypertension    Hypothyroidism, postsurgical    IBS (irritable bowel syndrome)    IC (interstitial cystitis)    Interstitial cystitis    Lymphedema    Migraine    monthly (05/17/2017)  02/06/2019- more frequently   OSA (obstructive sleep apnea)    Osteoarthritis    all over; mainly knees and back (05/17/2017)   PONV (postoperative nausea and vomiting)    PTSD (post-traumatic stress disorder)    PTSD (post-traumatic stress disorder)    Sickle cell trait    Sickle cell trait    Sjogren's syndrome    Thyroid  cancer (HCC)    Tremor 12/14/2014   hand   Uterine fibroid    Varicose veins    Vestibular dizziness    Vocal cord paralysis, unilateral complete    left;  POST TOTAL THYROIDECTOMY    Past Surgical  History:  Procedure Laterality Date   ACHILLES TENDON REPAIR Left 2007   BUNIONECTOMY WITH HAMMERTOE RECONSTRUCTION AND GASTROC SLIDE Left FEB  2011   REMOVAL HARDWARE  NOV  2011   CARDIAC CATHETERIZATION N/A 10/28/2015   Procedure: Right Heart Cath;  Surgeon: Gordy Bergamo, MD;  Location: [facility] INVASIVE CV LAB;  Service: Cardiovascular;  Laterality: N/A;   COLONOSCOPY WITH ESOPHAGOGASTRODUODENOSCOPY (EGD)  MULTIPLE --  LAST ONE   NOV  2014   CYSTO/  BILATERAL RETROGRADE PYELOGRAM/  HYDRODISTENTION/  INSTILLATION THERAPY  05-08-2003   CYSTO/  HYDRODISTENTION/  INSTILLATION THERAPY  06-04-2005  &  03-04-2007   CYSTO/  URETHRAL DILATION /  BILATERAL UNROOFING URETEROCELE/  BILATERAL URETERAL STENT PLACEMENT  12-01-2002   D & C HYSTEROSCOPY/  REMOVAL IUD  12-06-2003   DE QUERVAIN'S RELEASE Left ?09/2005   DIAGNOSTIC LAPAROSCOPY     DILATATION AND CURETTAGE WITH SUCTION  05-29-2005   RETAINED PLACENTA   DILATION AND CURETTAGE OF UTERUS     DX LAPAROSCOPY/  PELVIC BX'S/  LYSIS ADHESIONS  06-26-2000   FOOT HARDWARE REMOVAL Left 03/2010   REMOVAL HARDWARE related to earlier hammertoe OR   HYDRADENITIS EXCISION  06/29/2011   Procedure: EXCISION HYDRADENITIS AXILLA;  Surgeon: Elna LITTIE Pick, MD;  Location: [facility];  Service: Plastics;;  right breast hydradenitis excioion   HYDRADENITIS EXCISION Left 2000   INCISION AND DRAINAGE ABSCESS ANAL  02-14-2001   KNEE ARTHROSCOPY WITH LATERAL MENISECTOMY Right 09/08/2013   Procedure: RIGHT KNEE ARTHROSCOPY PARTIAL LATERAL MENISCECTOMY AND DEBRIDEMENT ;  Surgeon: Reyes JAYSON Billing, MD;  Location: [facility] [place];  Service: Orthopedics;  Laterality: Right;   LAPAROSCOPY ABDOMEN DIAGNOSTIC  SEPT  2003   [place]   AND PLACEMENT IUD   MASS EXCISION  10/29/2003   WIDE EXCISION CHEST AREA   PILONIDAL CYST EXCISION  1983   RADIOLOGY WITH ANESTHESIA N/A 02/07/2019   Procedure:  MRI BRAIN WITHOUT IV CONTRAST; MRI CERVICAL, THORACIC, AND  LUMBAR SPINE WITH AND WITHOUT CONTRAST;  Surgeon: Radiologist, Medication, MD;  Location: [facility] OR;  Service: Radiology;  Laterality: N/A;   RADIOLOGY WITH ANESTHESIA N/A 02/15/2020   Procedure: MRI LUMBAR WITHOUT CONTRAST;  Surgeon: Radiologist, Medication, MD;  Location: [facility] OR;  Service: Radiology;  Laterality: N/A;   REDUCTION MAMMAPLASTY  1997   TEE WITHOUT CARDIOVERSION N/A 09/26/2015   Procedure: TRANSESOPHAGEAL ECHOCARDIOGRAM (TEE);  Surgeon: Gordy Bergamo, MD;  Location: [facility] ENDOSCOPY;  Service: Cardiovascular;  Laterality: N/A;   TEMPORAL ARTERY BIOPSY / LIGATION Left 2010   TEMPORAL ARTERY BIOPSY / LIGATION Right 06-16-2002   TONSILLECTOMY  1990   TOTAL THYROIDECTOMY  11/06/2008    Review of systems negative except as noted in HPI / PMHx or noted below:  Review of Systems  Constitutional: Negative.   HENT: Negative.    Eyes: Negative.   Respiratory: Negative.    Cardiovascular: Negative.   Gastrointestinal: Negative.   Genitourinary: Negative.   Musculoskeletal: Negative.   Skin: Negative.   Neurological: Negative.   Endo/Heme/Allergies: Negative.   Psychiatric/Behavioral: Negative.       Objective:   Vitals:   04/06/24 0958 04/06/24 1020  BP: (!) 134/92 130/88  Pulse: 96   Resp: 18   SpO2: 93%    Height: 5' 3 (160 cm)  Weight: 230 lb 3.2 oz (104.4 kg)   Physical Exam Constitutional:      Appearance: She is not diaphoretic.  HENT:     Head: Normocephalic.     Right Ear: Tympanic membrane, ear canal and external ear normal.     Left Ear: Tympanic membrane, ear canal and external ear normal.     Nose: Nose normal. No mucosal edema or rhinorrhea.     Mouth/Throat:     Pharynx: Uvula midline. No oropharyngeal exudate.  Eyes:     Conjunctiva/sclera: Conjunctivae normal.  Neck:     Thyroid : No thyromegaly.     Trachea: Trachea normal. No tracheal tenderness or tracheal deviation.  Cardiovascular:     Rate and Rhythm: Normal rate and regular rhythm.     Heart  sounds: Normal heart sounds, S1 normal and S2 normal. No murmur heard. Pulmonary:     Effort: No respiratory distress.     Breath sounds: Normal breath sounds. No stridor. No wheezing or rales.  Lymphadenopathy:     Head:     Right side of head: No tonsillar adenopathy.     Left side of head: No tonsillar adenopathy.     Cervical: No cervical adenopathy.  Skin:    Findings: No erythema or rash.     Nails: There is no clubbing.  Neurological:     Mental Status: She is alert.     Diagnostics: Spirometry was performed and demonstrated an FEV1 of 1.48 at 69 % of predicted.  Assessment and Plan:   1. Not well controlled severe persistent asthma (HCC)   2. Perennial allergic rhinitis   3. Seasonal allergic rhinitis due to pollen   4. Pollen-food allergy , initial encounter   5. LPRD (laryngopharyngeal reflux disease)    1. Allergen avoidance measures - tree, grass, weeds, Alternaria, cockroach, specific foods  2. Treat and prevent inflammation of airway:   A. Fluticasone  110 HFA - 2 inhalations 2 times per day w/spacer (empty lungs)  B. Generic nasonex  - 1 spray each nostril 1 time per day   C. Tezepelumab  injections every 4 weeks  3. Treat and prevent  reflux induced airway irritation:   A. Minimize caffeine and chocolate consumption  B. Replace throat clearing with swallowing / drinking maneuver  C. Omeprazole  40 mg + bicarbonate 650 - 2 times per day  D. Revisit with GI doctor  4. If needed:   A. Albuterol  + Fluticasone  110 - 2 inhalations TOGETHER every 4-6 hours  B. Cetirizine  10 mg - 1 tablet 1 time per day  C. Pataday  - 1 drop each eye 1 time per day  D. Epi-Pen, benadryl , MD/ER evaluation for allergic reactions  5. Influenza = Tamiflu. Covid = Paxlovid  7. Return to clinic in 12 weeks or earlier if needed.    We will restart Dianah on a collection of anti-inflammatory agents for her airway including use of anti-TSLP antibody as well as restart her therapy for  reflux/LPR as noted above.  She really needs to revisit with her GI doctor regarding her rather significant reflux.  See her back in this clinic in 12 weeks.  Camellia Denis, MD Allergy  / Immunology [facility]

## 2024-04-06 NOTE — Patient Instructions (Signed)
  1. Allergen avoidance measures - tree, grass, weeds, Alternaria, cockroach, specific foods  2. Treat and prevent inflammation of airway:   A. Fluticasone  110 HFA - 2 inhalations 2 times per day w/spacer (empty lungs)  B. Generic nasonex  - 1 spray each nostril 1 time per day   C. Tezepelumab  injections every 4 weeks  3. Treat and prevent reflux induced airway irritation:   A. Minimize caffeine and chocolate consumption  B. Replace throat clearing with swallowing / drinking maneuver  C. Omeprazole  40 mg + bicarbonate 650 - 2 times per day  D. Revisit with GI doctor  4. If needed:   A. Albuterol  + Fluticasone  110 - 2 inhalations TOGETHER every 4-6 hours  B. Cetirizine  10 mg - 1 tablet 1 time per day  C. Pataday  - 1 drop each eye 1 time per day  D. Epi-Pen, benadryl , MD/ER evaluation for allergic reactions  5. Influenza = Tamiflu. Covid = Paxlovid  7. Return to clinic in 12 weeks or earlier if needed.

## 2024-04-10 ENCOUNTER — Encounter: Payer: Self-pay | Admitting: Allergy and Immunology

## 2024-04-11 ENCOUNTER — Telehealth: Payer: Self-pay | Admitting: *Deleted

## 2024-04-11 MED ORDER — TEZSPIRE 210 MG/1.91ML ~~LOC~~ SOAJ
210.0000 mg | SUBCUTANEOUS | 11 refills | Status: AC
  Filled 2024-04-12: qty 1.91, 28d supply, fill #0
  Filled 2024-05-08: qty 1.91, 28d supply, fill #1
  Filled 2024-06-05: qty 1.91, 28d supply, fill #2

## 2024-04-11 NOTE — Telephone Encounter (Signed)
-----   Message from [facility] R sent at 04/06/2024 12:26 PM EST ----- Please help with restarting Tezspire . Thank you, Chiquita SAUNDERS, CMA

## 2024-04-11 NOTE — Telephone Encounter (Signed)
 Spoke to patient and advise submit to [facility] she will get injs delivered to home to self admin

## 2024-04-11 NOTE — Telephone Encounter (Signed)
 Lm for patient to contact me to advise approval and submit to [facility] to restart Tezspire 

## 2024-04-12 ENCOUNTER — Other Ambulatory Visit (HOSPITAL_COMMUNITY): Payer: Self-pay

## 2024-04-12 ENCOUNTER — Other Ambulatory Visit: Payer: Self-pay

## 2024-04-12 NOTE — Progress Notes (Signed)
 Specialty Pharmacy Initial Fill Coordination Note  Alexis [name] is a 58 y.o. female contacted today regarding initial fill of specialty medication(s) Tezepelumab -ekko (Tezspire )   Patient requested Delivery   Delivery date: 04/18/24   Verified address: [address] [place]   Medication will be filled on: 04/17/24   Patient is aware of $4.00 copayment.

## 2024-04-12 NOTE — Progress Notes (Signed)
 Specialty Pharmacy Initiation Note   Alexis [name] is a 58 y.o. female who will be followed by the specialty pharmacy service for RxSp Asthma/COPD    Review of administration, indication, effectiveness, safety, potential side effects, storage/disposable, and missed dose instructions occurred today for patient's specialty medication(s) Tezepelumab -ekko (Tezspire )     Patient/Caregiver did not have any additional questions or concerns.   Patient's therapy is appropriate to: Initiate    Goals Addressed             This Visit's Progress    Minimize recurrence of flares       Patient is re-initiating therapy after break due to being unable to schedule appointment for follow up. Patient will maintain adherence.          Shanley Furlough M Bryam Taborda Specialty Pharmacist

## 2024-04-17 ENCOUNTER — Other Ambulatory Visit: Payer: Self-pay

## 2024-05-08 ENCOUNTER — Other Ambulatory Visit: Payer: Self-pay

## 2024-05-10 ENCOUNTER — Other Ambulatory Visit: Payer: Self-pay

## 2024-05-10 NOTE — Progress Notes (Signed)
 Specialty Pharmacy Refill Coordination Note  Alexis [name] is a 58 y.o. female contacted today regarding refills of specialty medication(s) Tezepelumab -ekko (Tezspire )   Patient requested Delivery   Delivery date: 05/16/24   Verified address: [address] [place]   Medication will be filled on: 05/15/24

## 2024-05-15 ENCOUNTER — Other Ambulatory Visit: Payer: Self-pay

## 2024-05-21 ENCOUNTER — Ambulatory Visit

## 2024-05-29 ENCOUNTER — Encounter: Payer: Self-pay | Admitting: Emergency Medicine

## 2024-05-29 ENCOUNTER — Ambulatory Visit
Admission: EM | Admit: 2024-05-29 | Discharge: 2024-05-29 | Disposition: A | Discharge status: Home / Self Care | Attending: Physician Assistant | Admitting: Physician Assistant

## 2024-05-29 DIAGNOSIS — J029 Acute pharyngitis, unspecified: Secondary | ICD-10-CM

## 2024-05-29 MED ORDER — AMOXICILLIN-POT CLAVULANATE 875-125 MG PO TABS: 1.0000 | ORAL_TABLET | Freq: Two times a day (BID) | ORAL | 0 refills | Status: AC

## 2024-05-29 NOTE — ED Triage Notes (Signed)
 Patient has had chills, fever,  sore throat,  that has been going since December 23rd.  Patient has been taken tylenol  and advil

## 2024-05-29 NOTE — Discharge Instructions (Signed)
 Return if any problems.

## 2024-06-01 NOTE — ED Provider Notes (Signed)
 " [facility]    CSN: [id number] Arrival date & time: 05/29/24  1641      History   Chief Complaint Chief Complaint  Patient presents with   Fever   Chills   Sore Throat    HPI Alexis [name] is a 59 y.o. female.   Patient reports that she has had throat discomfort since December 23.  Patient reports that she has a sensation of feeling something in her throat.  Patient reports when she looks in the back of her throat she sees lumpy areas that have not been there before.  Patient currently denies any fever or chills.  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  The history is provided by the patient. No language interpreter was used.  Fever Sore Throat    Past Medical History:  Diagnosis Date   Adrenal gland cyst    left/CT (05/17/2017)   Anemia    ASD (atrial septal defect)    per pt   Asthma    Bilateral dry eyes    Cervical spondylosis    C5-6   CHF (congestive heart failure) (HCC)    not sure   Chronic back pain    Chronic bronchitis (HCC)    Chronic colitis    Chronic lower GI bleeding    Complication of anesthesia    has awakened in past during surgery/  LEFT VOCAL CORD PARALYSIS  POST TOTAL THYROIDECTOMY  03-08-2009 ([facility])   Degenerative disk disease    cervical to sacrum (05/17/2017)   Depression    Diabetes (HCC)    Diabetes mellitus without complication (HCC)    Diabetic neuropathy, type II diabetes mellitus (HCC) 12/14/2014   not on medication   DJD (degenerative joint disease) of knee    Dyspnea    Endometriosis 01/18/2001   Family history of adverse reaction to anesthesia    mom wakes up during procedures   Fatty liver    Fibromyalgia    Gastric ulcer    GERD (gastroesophageal reflux disease)    Glaucoma of both eyes    H/O hiatal hernia    Hiatal hernia    High cholesterol    History of colon polyps    History of colon polyps    Hydradenitis    Hypertension     Hypothyroidism, postsurgical    IBS (irritable bowel syndrome)    IC (interstitial cystitis)    Interstitial cystitis    Lymphedema    Migraine    monthly (05/17/2017)  02/06/2019- more frequently   OSA (obstructive sleep apnea)    Osteoarthritis    all over; mainly knees and back (05/17/2017)   PONV (postoperative nausea and vomiting)    PTSD (post-traumatic stress disorder)    PTSD (post-traumatic stress disorder)    Sickle cell trait    Sickle cell trait    Sjogren's syndrome    Thyroid  cancer (HCC)    Tremor 12/14/2014   hand   Uterine fibroid    Varicose veins    Vestibular dizziness    Vocal cord paralysis, unilateral complete    left;  POST TOTAL THYROIDECTOMY    Patient Active Problem List   Diagnosis Date Noted   Chest pain 01/28/2022   Essential hypertension 01/28/2022   Dyslipidemia 01/28/2022   Dizziness 10/11/2020   Colitis with rectal bleeding 05/16/2017   Chronic pain    Nausea and vomiting    Fibromyalgia 02/21/2016  Tremor 12/14/2014   Diabetic neuropathy, type II diabetes mellitus (HCC) 12/14/2014   Varicose veins of bilateral lower extremities with other complications 07/25/2012   Leg swelling 07/25/2012   SOB (shortness of breath) 02/22/2012   Chronic pelvic pain in female 06/26/2000    Past Surgical History:  Procedure Laterality Date   ACHILLES TENDON REPAIR Left 2007   BUNIONECTOMY WITH HAMMERTOE RECONSTRUCTION AND GASTROC SLIDE Left FEB  2011   REMOVAL HARDWARE  NOV  2011   CARDIAC CATHETERIZATION N/A 10/28/2015   Procedure: Right Heart Cath;  Surgeon: Gordy Bergamo, MD;  Location: [facility] INVASIVE CV LAB;  Service: Cardiovascular;  Laterality: N/A;   COLONOSCOPY WITH ESOPHAGOGASTRODUODENOSCOPY (EGD)  MULTIPLE --  LAST ONE   NOV  2014   CYSTO/  BILATERAL RETROGRADE PYELOGRAM/  HYDRODISTENTION/  INSTILLATION THERAPY  05-08-2003   CYSTO/  HYDRODISTENTION/  INSTILLATION THERAPY  06-04-2005  &  03-04-2007   CYSTO/  URETHRAL DILATION /   BILATERAL UNROOFING URETEROCELE/  BILATERAL URETERAL STENT PLACEMENT  12-01-2002   D & C HYSTEROSCOPY/  REMOVAL IUD  12-06-2003   DE QUERVAIN'S RELEASE Left ?09/2005   DIAGNOSTIC LAPAROSCOPY     DILATATION AND CURETTAGE WITH SUCTION  05-29-2005   RETAINED PLACENTA   DILATION AND CURETTAGE OF UTERUS     DX LAPAROSCOPY/  PELVIC BX'S/  LYSIS ADHESIONS  06-26-2000   FOOT HARDWARE REMOVAL Left 03/2010   REMOVAL HARDWARE related to earlier hammertoe OR   HYDRADENITIS EXCISION  06/29/2011   Procedure: EXCISION HYDRADENITIS AXILLA;  Surgeon: Elna LITTIE Pick, MD;  Location: [facility];  Service: Plastics;;  right breast hydradenitis excioion   HYDRADENITIS EXCISION Left 2000   INCISION AND DRAINAGE ABSCESS ANAL  02-14-2001   KNEE ARTHROSCOPY WITH LATERAL MENISECTOMY Right 09/08/2013   Procedure: RIGHT KNEE ARTHROSCOPY PARTIAL LATERAL MENISCECTOMY AND DEBRIDEMENT ;  Surgeon: Reyes JAYSON Billing, MD;  Location: [facility];  Service: Orthopedics;  Laterality: Right;   LAPAROSCOPY ABDOMEN DIAGNOSTIC  SEPT  2003   [place]   AND PLACEMENT IUD   MASS EXCISION  10/29/2003   WIDE EXCISION CHEST AREA   PILONIDAL CYST EXCISION  1983   RADIOLOGY WITH ANESTHESIA N/A 02/07/2019   Procedure: MRI BRAIN WITHOUT IV CONTRAST; MRI CERVICAL, THORACIC, AND LUMBAR SPINE WITH AND WITHOUT CONTRAST;  Surgeon: Radiologist, Medication, MD;  Location: [facility] OR;  Service: Radiology;  Laterality: N/A;   RADIOLOGY WITH ANESTHESIA N/A 02/15/2020   Procedure: MRI LUMBAR WITHOUT CONTRAST;  Surgeon: Radiologist, Medication, MD;  Location: [facility] OR;  Service: Radiology;  Laterality: N/A;   REDUCTION MAMMAPLASTY  1997   TEE WITHOUT CARDIOVERSION N/A 09/26/2015   Procedure: TRANSESOPHAGEAL ECHOCARDIOGRAM (TEE);  Surgeon: Gordy Bergamo, MD;  Location: [facility]OSCOPY;  Service: Cardiovascular;  Laterality: N/A;   TEMPORAL ARTERY BIOPSY / LIGATION Left 2010   TEMPORAL ARTERY BIOPSY / LIGATION Right 06-16-2002    TONSILLECTOMY  1990   TOTAL THYROIDECTOMY  11/06/2008    OB History     Gravida  1   Para  1   Term      Preterm      AB      Living  1      SAB      IAB      Ectopic      Multiple      Live Births  1            Home Medications    Prior to Admission medications  Medication  Sig Start Date End Date Taking? Authorizing Provider  amoxicillin -clavulanate (AUGMENTIN ) 875-125 MG tablet Take 1 tablet by mouth 2 (two) times daily. 05/29/24  Yes Previn Jian K, PA-C  acetaminophen  (TYLENOL ) 500 MG tablet Take 500 mg by mouth every 6 (six) hours as needed for headache.    [provider]  ASPIRIN  81 PO Take by mouth daily. Patient taking differently: Take by mouth daily. AS needed    [provider]  atorvastatin  (LIPITOR) 40 MG tablet Take 1 tablet (40 mg total) by mouth daily. Patient not taking: Reported on 04/06/2024 01/31/22   Singh, Prashant K, MD  carvedilol  (COREG ) 6.25 MG tablet Take 1 tablet (6.25 mg total) by mouth 2 (two) times daily with a meal. 01/31/22   Dennise Lavada POUR, MD  cetirizine  (ZYRTEC ) 10 MG tablet TAKE 1 TABLET BY MOUTH ONCE DAILY AS NEEDED 08/17/23   Kozlow, Camellia PARAS, MD  cetirizine  (ZYRTEC ) 10 MG tablet Can take one tablet by mouth once daily if needed. 04/06/24   Kozlow, Camellia PARAS, MD  cyclobenzaprine  (FLEXERIL ) 10 MG tablet Take 10 mg by mouth 3 (three) times daily as needed. Patient taking differently: Take 5 mg by mouth 3 (three) times daily as needed. 02/12/24   [provider]  Diclofenac  Sodium 3 % GEL Apply 1 Application topically daily. Apply to the right knee 3 times a day. 01/31/22   Singh, Prashant K, MD  dicyclomine  (BENTYL ) 20 MG tablet Take 1 tablet (20 mg total) by mouth 4 (four) times daily -  before meals and at bedtime. 11/25/23   Kehrli, Kelsey F, PA-C  DULoxetine  (CYMBALTA ) 30 MG capsule Take 60 mg by mouth at bedtime.    [provider]  EPINEPHrine  0.3 mg/0.3 mL IJ SOAJ injection Use as directed  for life-threatening allergic reaction. 04/06/24   Kozlow, Camellia PARAS, MD  fluticasone  (FLOVENT  HFA) 110 MCG/ACT inhaler Inhale two puffs with spacer two times per day.  Rinse, gargle, and spit after use. 04/06/24   Kozlow, Eric J, MD  Ibuprofen (ADVIL PO) Take by mouth daily as needed.    [provider]  latanoprost (XALATAN) 0.005 % ophthalmic solution SMARTSIG:In Eye(s) Patient not taking: Reported on 04/06/2024 11/04/22   [provider]  lidocaine  (LIDODERM ) 5 % SMARTSIG:1 Patch(s) Topical As Directed 03/09/24   [provider]  lidocaine  (XYLOCAINE ) 5 % ointment Apply topically.    [provider]  LYRICA  75 MG capsule Take 75 mg by mouth at bedtime. 07/24/16   [provider]  MAGNESIUM PO Take 250 mg by mouth as needed.    [provider]  MELATONIN PO Take 5-10 mg by mouth at bedtime.    [provider]  mometasone  (NASONEX ) 50 MCG/ACT nasal spray Use one spray in each nostril once daily. 04/06/24   Kozlow, Camellia PARAS, MD  naloxone  (NARCAN ) nasal spray 4 mg/0.1 mL SMARTSIG:Both Nares 02/12/24   [provider]  Naproxen Sodium (ALEVE PO) Take by mouth as needed.    [provider]  Olopatadine  HCl 0.2 % SOLN Can use one drop in each eye once daily if needed. 10/08/22   Kozlow, Eric J, MD  Olopatadine  HCl 0.2 % SOLN Can use one drop in each eye once daily if needed. 04/06/24   Kozlow, Camellia PARAS, MD  omeprazole  (PRILOSEC) 40 MG capsule Take 1 capsule (40 mg total) by mouth 2 (two) times daily. 04/06/24   Kozlow, Camellia PARAS, MD  ondansetron  (ZOFRAN -ODT) 8 MG disintegrating tablet Take 8  mg by mouth every 8 (eight) hours as needed for nausea or vomiting.    [provider]  oxybutynin  (DITROPAN  XL) 15 MG 24 hr tablet Take 1 tablet (15 mg total) by mouth daily. Patient not taking: Reported on 04/06/2024 01/31/22   Singh, Prashant K, MD  Oxycodone  HCl 20 MG TABS Take 20 mg by mouth every 4 (four) hours as needed for pain.     [provider]  promethazine  (PHENERGAN ) 25 MG suppository Place 1 suppository (25 mg total) rectally every 6 (six) hours as needed for nausea or vomiting. 03/14/22   Smoot, Lauraine LABOR, PA-C  promethazine  (PHENERGAN ) 25 MG tablet Take 25 mg by mouth every 6 (six) hours as needed for nausea or vomiting. Patient not taking: Reported on 04/06/2024    [provider]  sodium bicarbonate  650 MG tablet Take one tablet by mouth twice daily. 04/06/24   Kozlow, Camellia PARAS, MD  Spacer/Aero-Holding Raguel FRENCH Use with inhaler as directed. 10/08/22   Kozlow, Camellia PARAS, MD  Tezepelumab -ekko (TEZSPIRE ) 210 MG/1. SOAJ Inject 210 mg into the skin every 28 (twenty-eight) days. 04/11/24   Kozlow, Camellia PARAS, MD  thyroid  (ARMOUR) 120 MG tablet Take 120 mg by mouth daily before breakfast.    [provider]  VENTOLIN  HFA 108 (90 Base) MCG/ACT inhaler Can inhale two puffs every four to six hours as needed for cough or wheeze. 04/06/24   Kozlow, Camellia PARAS, MD  Vitamin D , Ergocalciferol , (DRISDOL ) 1.25 MG (50000 UNIT) CAPS capsule Take 50,000 Units by mouth every Tuesday. 01/24/20   [provider]    Family History Family History  Problem Relation Age of Onset   Atrial fibrillation Mother    Diabetes Mother    Thyroid  disease Mother    Cushing syndrome Mother    Tremor Mother    Atrial fibrillation Father    Glaucoma Father    Tremor Sister    Cancer Paternal Aunt        breast, colon    Social History Social History[1]   Allergies   Food, Other, Adhesive [tape], Armour thyroid  [thyroid ], Bactrim, Clarithromycin, Ibuprofen, Meclizine , Metformin hcl, Nexium [esomeprazole], Nsaids, Peg [id number]-nabcb-nacl-nasulf, Simbrinza [brinzolamide -brimonidine], Sulfa antibiotics, Tisagenlecleucel, Tolmetin, Topiramate , Trimethoprim, and Latex   Review of Systems Review of Systems  Constitutional:  Positive for fever.  All other systems reviewed and are negative.    Physical  Exam Triage Vital Signs ED Triage Vitals  Encounter Vitals Group     BP 05/29/24 1811 124/85     Girls Systolic BP Percentile --      Girls Diastolic BP Percentile --      Boys Systolic BP Percentile --      Boys Diastolic BP Percentile --      Pulse Rate 05/29/24 1811 98     Resp 05/29/24 1811 18     Temp 05/29/24 1811 98.7 F (37.1 C)     Temp Source 05/29/24 1811 Oral     SpO2 05/29/24 1811 94 %     Weight --      Height --      Head Circumference --      Peak Flow --      Pain Score 05/29/24 1809 6     Pain Loc --      Pain Education --      Exclude from Growth Chart --    No data found.  Updated Vital Signs BP 124/85 (BP Location: Right Arm)   Pulse 98  Temp 98.7 F (37.1 C) (Oral)   Resp 18   LMP 02/04/2018   SpO2 94%   Visual Acuity Right Eye Distance:   Left Eye Distance:   Bilateral Distance:    Right Eye Near:   Left Eye Near:    Bilateral Near:     Physical Exam Vitals and nursing note reviewed.  Constitutional:      Appearance: She is well-developed.  HENT:     Head: Normocephalic.     Mouth/Throat:     Pharynx: Posterior oropharyngeal erythema present.  Cardiovascular:     Rate and Rhythm: Normal rate.  Pulmonary:     Effort: Pulmonary effort is normal.  Abdominal:     General: There is no distension.  Musculoskeletal:        General: Normal range of motion.     Cervical back: Normal range of motion.  Skin:    General: Skin is warm.  Neurological:     General: No focal deficit present.     Mental Status: She is alert and oriented to person, place, and time.      UC Treatments / Results  Labs (all labs ordered are listed, but only abnormal results are displayed) Labs Reviewed - No data to display  EKG   Radiology No results found.  Procedures Procedures (including critical care time)  Medications Ordered in UC Medications - No data to display  Initial Impression / Assessment and Plan / UC Course  I have reviewed the  triage vital signs and the nursing notes.  Pertinent labs & imaging results that were available during my care of the patient were reviewed by me and considered in my medical decision making (see chart for details).     Patient is given a prescription for Augmentin .  Patient is advised she should follow-up with ENT given her history and her concerns that there are changes to the back of her throat.  Patient is given referral information.  Discharged in stable condition she understands follow-up instructions Final Clinical Impressions(s) / UC Diagnoses   Final diagnoses:  None     Discharge Instructions      Return if any problems.    ED Prescriptions     Medication Sig Dispense Auth. Provider   amoxicillin -clavulanate (AUGMENTIN ) 875-125 MG tablet Take 1 tablet by mouth 2 (two) times daily. 20 tablet Nyari Olsson K, PA-C      PDMP not reviewed this encounter. An After Visit Summary was printed and given to the patient.        [1]  Social History Tobacco Use   Smoking status: Never   Smokeless tobacco: Never  Vaping Use   Vaping status: Never Used  Substance Use Topics   Alcohol use: Not Currently    Comment: rare   Drug use: No     Flint Sonny POUR, PA-C 06/01/24 1349  "

## 2024-06-05 ENCOUNTER — Other Ambulatory Visit: Payer: Self-pay

## 2024-06-06 ENCOUNTER — Telehealth: Payer: Self-pay | Admitting: Allergy and Immunology

## 2024-06-06 NOTE — Telephone Encounter (Signed)
 Patient states she is still having some discolored mucous, her voice is raspy and it comes and goes. She feels that her throat is raw. She states she can see 7 polyps in her mouth and yellow blotches. She wants to know if Dr. Maurilio is willing to refer her to an ENT.

## 2024-06-07 ENCOUNTER — Other Ambulatory Visit (HOSPITAL_COMMUNITY): Payer: Self-pay

## 2024-06-07 ENCOUNTER — Other Ambulatory Visit: Payer: Self-pay

## 2024-06-07 NOTE — Progress Notes (Signed)
 Specialty Pharmacy Refill Coordination Note  Spoke with Alexis [name] is a 59 y.o. female contacted today regarding refills of specialty medication(s) Tezepelumab -ekko (Tezspire )  Doses on hand: 0  Next inj: 06/15/24   Patient requested: Delivery   Delivery date: 06/08/24 (or 06/09/2024)   Verified address: [address] [place] [address]  Medication will be filled on 06/07/24

## 2024-06-09 ENCOUNTER — Other Ambulatory Visit: Payer: Self-pay

## 2024-06-09 ENCOUNTER — Other Ambulatory Visit: Payer: Self-pay | Admitting: *Deleted

## 2024-06-09 DIAGNOSIS — K219 Gastro-esophageal reflux disease without esophagitis: Secondary | ICD-10-CM

## 2024-06-09 DIAGNOSIS — R1310 Dysphagia, unspecified: Secondary | ICD-10-CM

## 2024-06-09 NOTE — Progress Notes (Signed)
 Clinical Intervention Note  Clinical Intervention Notes: Patient reported taking Augmentin . No DDIs identified with Tezspire    Clinical Intervention Outcomes: Prevention of an adverse drug event   Advertising Account Planner

## 2024-06-09 NOTE — Telephone Encounter (Signed)
 ENT referral sent to [facility].

## 2024-06-09 NOTE — Telephone Encounter (Signed)
Patient is aware that referral has been sent.

## 2024-06-28 ENCOUNTER — Ambulatory Visit: Admitting: Allergy and Immunology

## 2024-07-17 ENCOUNTER — Institutional Professional Consult (permissible substitution) (INDEPENDENT_AMBULATORY_CARE_PROVIDER_SITE_OTHER)
